# Patient Record
Sex: Male | Born: 1960
Health system: Southern US, Community
[De-identification: ages and names within clinical notes are randomized; demographics above are authoritative.]

## PROBLEM LIST (undated history)

## (undated) DIAGNOSIS — I779 Disorder of arteries and arterioles, unspecified: Secondary | ICD-10-CM

## (undated) DIAGNOSIS — I2510 Atherosclerotic heart disease of native coronary artery without angina pectoris: Secondary | ICD-10-CM

## (undated) DIAGNOSIS — E785 Hyperlipidemia, unspecified: Secondary | ICD-10-CM

## (undated) DIAGNOSIS — I739 Peripheral vascular disease, unspecified: Secondary | ICD-10-CM

## (undated) DIAGNOSIS — I482 Chronic atrial fibrillation, unspecified: Secondary | ICD-10-CM

## (undated) DIAGNOSIS — Z87442 Personal history of urinary calculi: Secondary | ICD-10-CM

## (undated) DIAGNOSIS — Z720 Tobacco use: Secondary | ICD-10-CM

## (undated) DIAGNOSIS — I10 Essential (primary) hypertension: Secondary | ICD-10-CM

## (undated) HISTORY — DX: Peripheral vascular disease, unspecified: I73.9

## (undated) HISTORY — DX: Atherosclerotic heart disease of native coronary artery without angina pectoris: I25.10

## (undated) HISTORY — DX: Personal history of urinary calculi: Z87.442

## (undated) HISTORY — DX: Essential (primary) hypertension: I10

## (undated) HISTORY — DX: Hyperlipidemia, unspecified: E78.5

## (undated) HISTORY — DX: Tobacco use: Z72.0

---

## 1989-02-19 HISTORY — PX: BACK SURGERY: SHX140

## 2007-07-29 ENCOUNTER — Ambulatory Visit: Payer: Self-pay | Admitting: Cardiology

## 2007-07-30 ENCOUNTER — Inpatient Hospital Stay (HOSPITAL_COMMUNITY): Admission: AD | Admit: 2007-07-30 | Discharge: 2007-08-01 | Payer: Self-pay | Admitting: Cardiovascular Disease

## 2007-07-30 ENCOUNTER — Ambulatory Visit: Payer: Self-pay | Admitting: Cardiology

## 2007-07-31 HISTORY — PX: CARDIAC CATHETERIZATION: SHX172

## 2009-02-19 HISTORY — PX: KIDNEY STONE SURGERY: SHX686

## 2010-07-04 NOTE — Discharge Summary (Signed)
NAME:  SHAUGHN, THOMLEY NO.:  [id number]   MEDICAL RECORD NO.:  [id number]          PATIENT TYPE:  INP   LOCATION:  2038                         FACILITY:  [facility]   PHYSICIAN:  Bevelyn Buckles. Bensimhon, MDDATE OF BIRTH:  03/02/1960   DATE OF ADMISSION:  07/30/2007  DATE OF DISCHARGE:  08/01/2007                               DISCHARGE SUMMARY   REFERRING PHYSICIAN:  Dr. Wende Crease and also Dr. Lewayne Bunting.   REASON FOR ADMISSION:  Chest pain.   PROCEDURES PERFORMED:  Cardiac catheterization.   SECONDARY DIAGNOSES:  Diabetes and tobacco use.   HOSPITAL COURSE:  Mr. Wynter is a 50 year old male with a history of  diabetes and tobacco use.  He had no known history of heart disease.  He  was admitted to [facility] with chest pain and a presyncopal episode.  He  ruled out for myocardial infarction with serial cardiac markers.  He  underwent a stress echocardiogram, which showed EF of 55-60%; however,  there were ST depression with exercise and a question of an inferior  wall motion abnormality, so he was referred for cardiac catheterization.   While he was in the hospital, he was kept on nicotine patch and seen by  the diabetes coordinator.   On June 11, he underwent cardiac catheterization by Dr. Tonny Bollman.  This showed nonobstructive coronary artery disease.  He had 30% lesion  in the mid RCA, 30% lesion in the PDA, 45% lesion in the LAD and mid  LAD, and 30% lesion in the diagonal.  The left circumflex was normal.  The LV function was 60%.   His postcatheterization course was uncomplicated.  His groin site looked  good.   DISCHARGE MEDICATIONS:  He will be discharged home on the following  medications:  1. Aspirin 81 mg a day.  2. Simvastatin 40 mg a day.  3. Nicotine patch 21 mg a day.   FOLLOWUP AFTER DISCHARGE:  1. [facility] [place], on August 05, 2007 to address his      diabetes and other risk factors.  2. He will follow up with Dr. Andee Lineman  in the Cardiology Clinic at      [facility] as needed.  He had a thorough groin check prior to      discharge and will not need a specific visits for that unless he      develops problems.   Duration of the discharge encounter was 20 minutes.      Bevelyn Buckles. Bensimhon, MD  Electronically Signed     DRB/MEDQ  D:  08/01/2007  T:  08/02/2007  Job:  [id number]

## 2010-11-16 LAB — BASIC METABOLIC PANEL
BUN: 11
CO2: 24
Calcium: 9.1
Chloride: 104
Creatinine, Ser: 0.79
GFR calc Af Amer: >60
GFR calc non Af Amer: >60
Glucose, Bld: 201 — ABNORMAL HIGH
Potassium: 3.8
Sodium: 135

## 2010-11-16 LAB — COMPREHENSIVE METABOLIC PANEL
ALT: 36
AST: 32
Albumin: 4.1
Alkaline Phosphatase: 79
BUN: 11
CO2: 27
Calcium: 9.8
Chloride: 102
Creatinine, Ser: 0.84
GFR calc Af Amer: >60
GFR calc non Af Amer: >60
Glucose, Bld: 180 — ABNORMAL HIGH
Potassium: 4.2
Sodium: 137
Total Bilirubin: 0.7
Total Protein: 8

## 2010-11-16 LAB — CARDIAC PANEL(CRET KIN+CKTOT+MB+TROPI)
CK, MB: 1.4
CK, MB: 1.6
CK, MB: 1.8
Relative Index: INVALID
Relative Index: INVALID
Relative Index: INVALID
Total CK: 61
Total CK: 63
Total CK: 67
Troponin I: 0.01
Troponin I: 0.03
Troponin I: 0.03

## 2010-11-16 LAB — APTT: aPTT: 29

## 2010-11-16 LAB — CBC
HCT: 45.2
HCT: 47.4
Hemoglobin: 15.6
Hemoglobin: 16.2
MCHC: 34.2
MCHC: 34.5
MCV: 87.6
MCV: 87.8
Platelets: 133 — ABNORMAL LOW
Platelets: 160
RBC: 5.15
RBC: 5.41
RDW: 13.4
RDW: 13.6
WBC: 13.7 — ABNORMAL HIGH
WBC: 15.9 — ABNORMAL HIGH

## 2010-11-16 LAB — DIFFERENTIAL
Basophils Absolute: 0
Basophils Relative: 0
Eosinophils Absolute: 0.4
Eosinophils Relative: 3
Lymphocytes Relative: 33
Lymphs Abs: 4.5 — ABNORMAL HIGH
Monocytes Absolute: 1.2 — ABNORMAL HIGH
Monocytes Relative: 9
Neutro Abs: 7.6
Neutrophils Relative %: 56

## 2010-11-16 LAB — B-NATRIURETIC PEPTIDE (CONVERTED LAB): Pro B Natriuretic peptide (BNP): 55

## 2010-11-16 LAB — PROTIME-INR
INR: 1
Prothrombin Time: 13.1

## 2010-11-16 LAB — TSH: TSH: 3.113

## 2011-01-04 ENCOUNTER — Encounter: Payer: Self-pay | Admitting: *Deleted

## 2011-01-04 DIAGNOSIS — I4891 Unspecified atrial fibrillation: Secondary | ICD-10-CM

## 2011-01-08 ENCOUNTER — Ambulatory Visit (INDEPENDENT_AMBULATORY_CARE_PROVIDER_SITE_OTHER): Payer: Self-pay | Admitting: Cardiovascular Disease

## 2011-01-08 ENCOUNTER — Encounter: Payer: Self-pay | Admitting: Cardiovascular Disease

## 2011-01-08 VITALS — BP 133/77 | HR 60 | Ht 72.0 in | Wt 217.0 lb

## 2011-01-08 DIAGNOSIS — I2510 Atherosclerotic heart disease of native coronary artery without angina pectoris: Secondary | ICD-10-CM

## 2011-01-08 DIAGNOSIS — I4891 Unspecified atrial fibrillation: Secondary | ICD-10-CM

## 2011-01-08 DIAGNOSIS — I480 Paroxysmal atrial fibrillation: Secondary | ICD-10-CM

## 2011-01-08 DIAGNOSIS — I10 Essential (primary) hypertension: Secondary | ICD-10-CM

## 2011-01-08 NOTE — Patient Instructions (Signed)
Your physician wants you to follow-up in: 3 months with Dr. Earnestine Leys. You will receive a reminder letter in the mail one-two months in advance. If you don't receive a letter, please call our office to schedule the follow-up appointment. Your physician recommends that you continue on your current medications as directed. Please refer to the Current Medication list given to you today.

## 2011-01-09 ENCOUNTER — Encounter: Payer: Self-pay | Admitting: Cardiovascular Disease

## 2011-01-09 DIAGNOSIS — I2510 Atherosclerotic heart disease of native coronary artery without angina pectoris: Secondary | ICD-10-CM | POA: Insufficient documentation

## 2011-01-09 NOTE — Progress Notes (Signed)
HPI  This is a 50 year old male who is here today for a followup visit after he was seen recently in the emergency room by Dr. Earnestine Leys. The patient was sent to the emergency room from the health clinic Department. He complained day of episodes of anxiety and palpitations. They did an ECG which showed atrial fibrillation which was new. In the emergency room, his cardiac enzymes were negative. He had a quick bedside echo done which showed normal left ventricular systolic function and left atrial size. He was asked to take aspirin daily and was started on metoprolol 25 mg twice daily with arrangement for an early clinic followup for possible cardioversion. The patient overall feels better. He denies chest pain or dyspnea. He describes these episodes of anxiety which mostly happens in the morning. These has been less since he started taking metoprolol. He denies syncope or presyncope.  Allergies  Allergen Reactions  . Ciprofloxacin Rash     No current outpatient prescriptions on file prior to visit.     Past Medical History  Diagnosis Date  . Diabetes mellitus   . Tobacco abuse   . Dyslipidemia   . Hypertension   . History of kidney stones   . Paroxysmal atrial fibrillation 12/2010  . Coronary artery disease      Past Surgical History  Procedure Date  . Back surgery 1991  . Kidney stone surgery 2011  . Cardiac catheterization 07/31/2007    This showed nonobstructive coronary artery disease   He had 30% lesion in the mid RCA, 30% lesion in the PDA, 45% lesion in the LAD and mid  LAD, and 30% lesion in the diagonal.  The left circumflex was normal.  The LV function was 60%.        Family History  Problem Relation Age of Onset  . Coronary artery disease Father     CABG in early 68's  . Heart attack Father   . Cancer Mother   . Diabetes Maternal Grandmother   . SIDS Sister      History   Social History  . Marital Status: Single    Spouse Name: N/A    Number of Children: N/A    . Years of Education: N/A   Occupational History  . plumber     by trade   Social History Main Topics  . Smoking status: Current Everyday Smoker -- 1.0 packs/day for 35 years    Types: Cigarettes  . Smokeless tobacco: Never Used   Comment: started smoking at age 89  . Alcohol Use: Not on file  . Drug Use: Not on file  . Sexually Active: Not on file   Other Topics Concern  . Not on file   Social History Narrative   Has 2 children [place] with wifeCurrently unemployed     PHYSICAL EXAM   BP 133/77  Pulse 60  Ht 6' (1.829 m)  Wt 217 lb (98.431 kg)  BMI 29.43 kg/m2  Constitutional: He is oriented to person, place, and time. He appears well-developed and well-nourished. No distress.  HENT: No nasal discharge.  Head: Normocephalic and atraumatic.  Eyes: Pupils are equal, round, and reactive to light. Right eye exhibits no discharge. Left eye exhibits no discharge.  Neck: Normal range of motion. Neck supple. No JVD present. No thyromegaly present.  Cardiovascular: Normal rate, regular rhythm, normal heart sounds and intact distal pulses. Exam reveals no gallop and no friction rub.  No murmur heard.  Pulmonary/Chest: Effort normal  and breath sounds normal. No stridor. No respiratory distress. He has no wheezes. He has no rales. He exhibits no tenderness.  Abdominal: Soft. Bowel sounds are normal. He exhibits no distension. There is no tenderness. There is no rebound and no guarding.  Musculoskeletal: Normal range of motion. He exhibits no edema and no tenderness.  Neurological: He is alert and oriented to person, place, and time. Coordination normal.  Skin: Skin is warm and dry. No rash noted. He is not diaphoretic. No erythema. No pallor.  Psychiatric: He has a normal mood and affect. His behavior is normal. Judgment and thought content normal.      EKG: Normal sinus rhythm with no significant ST or T wave changes.   ASSESSMENT AND PLAN

## 2011-01-09 NOTE — Assessment & Plan Note (Signed)
The patient has known history of mild nonobstructive coronary artery disease. He is not having any symptoms of angina at this time. I discussed with him the importance of lifestyle changes especially smoking cessation.

## 2011-01-09 NOTE — Assessment & Plan Note (Signed)
The patient is currently in normal sinus rhythm. He seems to have paroxysmal atrial fibrillation. Symptomatically he seems to be better after taking metoprolol 25 mg twice daily. Had a prolonged discussion with him about the natural history and complications associated with atrial fibrillation. At this time, obviously there is no need for cardioversion given that he converted with metoprolol. This medication will be continued. I also recommend continuing aspirin daily. His chads score is 2 due to history of hypertension and diabetes. Thus, I think he would benefit from long-term anticoagulation. This was discussed with him. Unfortunately, he does not have financial means at this point to followup with regular INR checks. He is also not able to afford the newer medications. He understands the risk. I will have him followup with Dr. Earnestine Leys in 3 months from now.

## 2016-03-05 ENCOUNTER — Encounter (HOSPITAL_COMMUNITY): Payer: Self-pay | Admitting: Emergency Medicine

## 2016-03-05 ENCOUNTER — Emergency Department (HOSPITAL_COMMUNITY): Payer: 59

## 2016-03-05 ENCOUNTER — Emergency Department (HOSPITAL_COMMUNITY)
Admission: EM | Admit: 2016-03-05 | Discharge: 2016-03-05 | Disposition: A | Discharge status: Home / Self Care | Payer: 59 | Attending: Emergency Medicine | Admitting: Emergency Medicine

## 2016-03-05 DIAGNOSIS — Z7984 Long term (current) use of oral hypoglycemic drugs: Secondary | ICD-10-CM | POA: Diagnosis not present

## 2016-03-05 DIAGNOSIS — I2510 Atherosclerotic heart disease of native coronary artery without angina pectoris: Secondary | ICD-10-CM | POA: Insufficient documentation

## 2016-03-05 DIAGNOSIS — J40 Bronchitis, not specified as acute or chronic: Secondary | ICD-10-CM | POA: Diagnosis not present

## 2016-03-05 DIAGNOSIS — E1165 Type 2 diabetes mellitus with hyperglycemia: Secondary | ICD-10-CM | POA: Insufficient documentation

## 2016-03-05 DIAGNOSIS — I480 Paroxysmal atrial fibrillation: Secondary | ICD-10-CM | POA: Insufficient documentation

## 2016-03-05 DIAGNOSIS — F17210 Nicotine dependence, cigarettes, uncomplicated: Secondary | ICD-10-CM | POA: Diagnosis not present

## 2016-03-05 DIAGNOSIS — R739 Hyperglycemia, unspecified: Secondary | ICD-10-CM

## 2016-03-05 DIAGNOSIS — R05 Cough: Secondary | ICD-10-CM | POA: Diagnosis present

## 2016-03-05 LAB — CBC WITH DIFFERENTIAL/PLATELET
Basophils Absolute: 0 10*3/uL (ref 0.0–0.1)
Basophils Relative: 0 %
Eosinophils Absolute: 0.2 10*3/uL (ref 0.0–0.7)
Eosinophils Relative: 1 %
HCT: 48.3 % (ref 39.0–52.0)
Hemoglobin: 17 g/dL (ref 13.0–17.0)
Lymphocytes Relative: 24 %
Lymphs Abs: 3.7 10*3/uL (ref 0.7–4.0)
MCH: 30.7 pg (ref 26.0–34.0)
MCHC: 35.2 g/dL (ref 30.0–36.0)
MCV: 87.3 fL (ref 78.0–100.0)
Monocytes Absolute: 1.2 10*3/uL — ABNORMAL HIGH (ref 0.1–1.0)
Monocytes Relative: 8 %
Neutro Abs: 10.5 10*3/uL — ABNORMAL HIGH (ref 1.7–7.7)
Neutrophils Relative %: 67 %
Platelets: 198 10*3/uL (ref 150–400)
RBC: 5.53 MIL/uL (ref 4.22–5.81)
RDW: 12.6 % (ref 11.5–15.5)
WBC: 15.6 10*3/uL — ABNORMAL HIGH (ref 4.0–10.5)

## 2016-03-05 LAB — URINALYSIS, ROUTINE W REFLEX MICROSCOPIC
Bilirubin Urine: NEGATIVE
Glucose, UA: >=500 mg/dL — AB
Hgb urine dipstick: NEGATIVE
Ketones, ur: NEGATIVE mg/dL
Leukocytes, UA: NEGATIVE
Nitrite: NEGATIVE
Specific Gravity, Urine: 1.015 (ref 1.005–1.030)
pH: 6 (ref 5.0–8.0)

## 2016-03-05 LAB — TSH: TSH: 1.9 u[IU]/mL (ref 0.350–4.500)

## 2016-03-05 LAB — URINALYSIS, MICROSCOPIC (REFLEX)
Bacteria, UA: NONE SEEN
RBC / HPF: NONE SEEN RBC/hpf (ref 0–5)
Squamous Epithelial / LPF: NONE SEEN
WBC, UA: NONE SEEN WBC/hpf (ref 0–5)

## 2016-03-05 LAB — COMPREHENSIVE METABOLIC PANEL
ALT: 18 U/L (ref 17–63)
AST: 17 U/L (ref 15–41)
Albumin: 3.8 g/dL (ref 3.5–5.0)
Alkaline Phosphatase: 64 U/L (ref 38–126)
Anion gap: 10 (ref 5–15)
BUN: 14 mg/dL (ref 6–20)
CO2: 22 mmol/L (ref 22–32)
Calcium: 9.3 mg/dL (ref 8.9–10.3)
Chloride: 100 mmol/L — ABNORMAL LOW (ref 101–111)
Creatinine, Ser: 0.86 mg/dL (ref 0.61–1.24)
GFR calc Af Amer: >60 mL/min (ref >60)
GFR calc non Af Amer: >60 mL/min (ref >60)
Glucose, Bld: 371 mg/dL — ABNORMAL HIGH (ref 65–99)
Potassium: 3.6 mmol/L (ref 3.5–5.1)
Sodium: 132 mmol/L — ABNORMAL LOW (ref 135–145)
Total Bilirubin: 0.5 mg/dL (ref 0.3–1.2)
Total Protein: 7.9 g/dL (ref 6.5–8.1)

## 2016-03-05 LAB — MAGNESIUM: Magnesium: 1.8 mg/dL (ref 1.7–2.4)

## 2016-03-05 LAB — TROPONIN I: Troponin I: 0.02 ng/mL (ref <0.03)

## 2016-03-05 MED ORDER — ALBUTEROL SULFATE HFA 108 (90 BASE) MCG/ACT IN AERS
2.0000 | INHALATION_SPRAY | Freq: Once | RESPIRATORY_TRACT | Status: AC
  Administered 2016-03-05: 2 via RESPIRATORY_TRACT
  Filled 2016-03-05: qty 6.7

## 2016-03-05 MED ORDER — SODIUM CHLORIDE 0.9 % IV BOLUS (SEPSIS)
1000.0000 mL | Freq: Once | INTRAVENOUS | Status: AC
  Administered 2016-03-05: 1000 mL via INTRAVENOUS

## 2016-03-05 MED ORDER — IPRATROPIUM-ALBUTEROL 0.5-2.5 (3) MG/3ML IN SOLN
3.0000 mL | Freq: Once | RESPIRATORY_TRACT | Status: AC
  Administered 2016-03-05: 3 mL via RESPIRATORY_TRACT
  Filled 2016-03-05: qty 3

## 2016-03-05 MED ORDER — METFORMIN HCL 500 MG PO TABS: 1000.0000 mg | ORAL_TABLET | Freq: Three times a day (TID) | ORAL | 0 refills | Status: DC

## 2016-03-05 MED ORDER — AZITHROMYCIN 250 MG PO TABS: ORAL_TABLET | ORAL | 0 refills | Status: DC

## 2016-03-05 MED ORDER — GLIMEPIRIDE 4 MG PO TABS: 4.0000 mg | ORAL_TABLET | Freq: Every day | ORAL | 0 refills | Status: DC

## 2016-03-05 MED ORDER — METOPROLOL TARTRATE 25 MG PO TABS: 25.0000 mg | ORAL_TABLET | Freq: Two times a day (BID) | ORAL | 0 refills | Status: DC

## 2016-03-05 MED ORDER — BENAZEPRIL HCL 10 MG PO TABS: 10.0000 mg | ORAL_TABLET | Freq: Every day | ORAL | 0 refills | Status: DC

## 2016-03-05 MED ORDER — METOPROLOL TARTRATE 25 MG PO TABS
25.0000 mg | ORAL_TABLET | Freq: Once | ORAL | Status: AC
  Administered 2016-03-05: 25 mg via ORAL
  Filled 2016-03-05: qty 1

## 2016-03-05 NOTE — ED Notes (Signed)
During triage, patient states he is not taking any of his medications. States "yea I have high blood pressure, diabetes, and a-fib but I stopped taking my medicine a long time ago cause I don't feel any symptoms."

## 2016-03-05 NOTE — Discharge Instructions (Signed)
Follow-up with one of the clinics listed, to establish primary care.  You have an appt with Dr. Verna CzechBranch's office (cardiology) on Jan 29th at 10 am.  Try to arrive 15 min early to register

## 2016-03-05 NOTE — ED Triage Notes (Signed)
Patient complaining of non-productive cough and nasal congestion x 2 days.

## 2016-03-05 NOTE — ED Provider Notes (Signed)
[facility]EMER[facility]Y DEPT Provider Note   CSN: [id number] Arrival date & time: 03/05/16  0904     History   Chief Complaint Chief Complaint  Patient presents with  . Cough    HPI Willie Morales is a 56 y.o. male.  HPI   Willie Morales is a 56 y.o. male with hx of chronic Atrial fib, DM, and HTN and non-compliance of medication, who presents to the Emergency Department complaining of cough and nasal congestion for 2 days.  States the cough is non-productive and worse at night.  He states that his employer advised him to "get checked out."  He has tried OTC cold and cough medication without relief. He denies chest pain, shortness of breath, fever. He has that he stopped taking his medications greater than 6 months ago.   Past Medical History:  Diagnosis Date  . Coronary artery disease   . Diabetes mellitus   . Dyslipidemia   . History of kidney stones   . Hypertension   . Paroxysmal atrial fibrillation (HCC) 12/2010  . Tobacco abuse     Patient Active Problem List   Diagnosis Date Noted  . Paroxysmal atrial fibrillation (HCC)   . Coronary artery disease     Past Surgical History:  Procedure Laterality Date  . BACK SURGERY  1991  . CARDIAC CATHETERIZATION  07/31/2007   This showed nonobstructive coronary artery disease   He had 30% lesion in the mid RCA, 30% lesion in the PDA, 45% lesion in the LAD and mid  LAD, and 30% lesion in the diagonal.  The left circumflex was normal.  The LV function was 60%.     Marland Kitchen KIDNEY STONE SURGERY  2011       Home Medications    Prior to Admission medications   Medication Sig Start Date End Date Taking? Authorizing Provider  aspirin EC 81 MG tablet Take 81 mg by mouth daily.      Historical Provider, MD  benazepril (LOTENSIN) 10 MG tablet Take 10 mg by mouth daily.      Historical Provider, MD  fish oil-omega-3 fatty acids 1000 MG capsule Take 1 g by mouth 2 (two) times daily.      Historical Provider, MD  glimepiride (AMARYL) 4 MG tablet  Take 4 mg by mouth 2 (two) times daily.      Historical Provider, MD  metFORMIN (GLUCOPHAGE) 1000 MG tablet Take 1,500 mg by mouth daily with breakfast. And 1000mg  at evening     Historical Provider, MD  metoprolol tartrate (LOPRESSOR) 25 MG tablet Take 25 mg by mouth 2 (two) times daily.      Historical Provider, MD  pravastatin (PRAVACHOL) 40 MG tablet Take 40 mg by mouth daily.      Historical Provider, MD    Family History Family History  Problem Relation Age of Onset  . Coronary artery disease Father     CABG in early 12's  . Heart attack Father   . Cancer Mother   . Diabetes Maternal Grandmother   . SIDS Sister     Social History Social History  Substance Use Topics  . Smoking status: Current Every Day Smoker    Packs/day: 1.00    Years: 35.00    Types: Cigarettes  . Smokeless tobacco: Never Used     Comment: started smoking at age 13  . Alcohol use Not on file     Allergies   Ciprofloxacin   Review of Systems Review of Systems  Constitutional: Negative for  appetite change, chills and fever.  HENT: Positive for congestion. Negative for sore throat and trouble swallowing.   Respiratory: Positive for cough and chest tightness. Negative for shortness of breath and wheezing.   Cardiovascular: Negative for chest pain.  Gastrointestinal: Negative for abdominal pain, nausea and vomiting.  Genitourinary: Negative for dysuria.  Musculoskeletal: Negative for arthralgias.  Skin: Negative for rash.  Neurological: Negative for dizziness, weakness and numbness.  Hematological: Negative for adenopathy.  All other systems reviewed and are negative.    Physical Exam Updated Vital Signs BP 161/98 (BP Location: Left Arm)   Pulse 69   Temp 97.9 F (36.6 C) (Oral)   Resp 22   Ht 6\' 1"  (1.854 m)   Wt 77.1 kg   SpO2 100%   BMI 22.43 kg/m   Physical Exam  Constitutional: He is oriented to person, place, and time. He appears well-developed and well-nourished. No distress.   HENT:  Head: Normocephalic and atraumatic.  Right Ear: Tympanic membrane and ear canal normal.  Left Ear: Tympanic membrane and ear canal normal.  Mouth/Throat: Uvula is midline, oropharynx is clear and moist and mucous membranes are normal. No oropharyngeal exudate.  Eyes: EOM are normal. Pupils are equal, round, and reactive to light.  Neck: Normal range of motion, full passive range of motion without pain and phonation normal. Neck supple.  Cardiovascular: Intact distal pulses.  An irregularly irregular rhythm present. Tachycardia present.   No murmur heard. Pulmonary/Chest: Effort normal. No stridor. No respiratory distress. He has no rales. He exhibits no tenderness.  Coarse lungs sounds bilaterally.    Musculoskeletal: He exhibits no edema.  Lymphadenopathy:    He has no cervical adenopathy.  Neurological: He is alert and oriented to person, place, and time. No sensory deficit. He exhibits normal muscle tone. Coordination and gait normal. GCS eye subscore is 4. GCS verbal subscore is 5. GCS motor subscore is 6.  Skin: Skin is warm and dry.  Psychiatric: He has a normal mood and affect.  Nursing note and vitals reviewed.    ED Treatments / Results  Labs (all labs ordered are listed, but only abnormal results are displayed) Labs Reviewed  CBC WITH DIFFERENTIAL/PLATELET - Abnormal; Notable for the following:       Result Value   WBC 15.6 (*)    Neutro Abs 10.5 (*)    Monocytes Absolute 1.2 (*)    All other components within normal limits  COMPREHENSIVE METABOLIC PANEL - Abnormal; Notable for the following:    Sodium 132 (*)    Chloride 100 (*)    Glucose, Bld 371 (*)    All other components within normal limits  URINALYSIS, ROUTINE W REFLEX MICROSCOPIC - Abnormal; Notable for the following:    Glucose, UA >=500 (*)    Protein, ur TRACE (*)    All other components within normal limits  TROPONIN I  URINALYSIS, MICROSCOPIC (REFLEX)  MAGNESIUM  TSH    EKG  EKG  Interpretation  Date/Time:  Monday March 05 2016 10:09:02 EST Ventricular Rate:  127 PR Interval:    QRS Duration: 92 QT Interval:  295 QTC Calculation: 429 R Axis:   83 Text Interpretation:  Atrial fibrillation Ventricular premature complex Anteroseptal infarct, old Borderline repolarization abnormality history of atrial fibrillation, no prior EKG  Confirmed by LIU MD, DANA [phone]([id number]) on 03/05/2016 10:27:43 AM       Radiology Dg Chest 2 View  Result Date: 03/05/2016 CLINICAL DATA:  Nonproductive cough and nasal congestion 2 days.  EXAM: CHEST  2 VIEW COMPARISON:  01/04/2011 and 07/30/2007 FINDINGS: Lungs are adequately inflated without focal airspace consolidation or effusion. There is subtle interstitial prominence over the lung bases. Cardiomediastinal silhouette is within normal. There is minimal degenerative change of the spine. IMPRESSION: No acute airspace consolidation or effusion. Subtle interstitial prominence over the lung bases which may be chronic although could be seen with an atypical infectious or inflammatory process. Electronically Signed   By: Elberta Fortis M.D.   On: 03/05/2016 10:32     Procedures Procedures (including critical care time)  Medications Ordered in ED Medications  ipratropium-albuterol (DUONEB) 0.5-2.5 (3) MG/3ML nebulizer solution 3 mL (3 mLs Nebulization Given 03/05/16 1026)  sodium chloride 0.9 % bolus 1,000 mL (0 mLs Intravenous Stopped 03/05/16 1220)  metoprolol tartrate (LOPRESSOR) tablet 25 mg (25 mg Oral Given 03/05/16 1335)  albuterol (PROVENTIL HFA;VENTOLIN HFA) 108 (90 Base) MCG/ACT inhaler 2 puff (2 puffs Inhalation Given 03/05/16 1301)     Initial Impression / Assessment and Plan / ED Course  I have reviewed the triage vital signs and the nursing notes.  Pertinent labs & imaging results that were available during my care of the patient were reviewed by me and considered in my medical decision making (see chart for details).  Clinical  Course     Pt is well appearing.  Vitals stable.  Likely viral URI.  CXR neg for PNA. Lung sounds improved after albuterol neb  HR irregular, rate improved after IVF's.  1240  Consulted Dr. Wyline Mood, discussed findings, recommended re start pt's Lopressor 25 mg BID and office will call back with appt. Time  Will refill other medications, give referral info to primary care.  Pt agrees to f/u plan and cards f/u appt.  Strict return precautions also given.    The patient appears reasonably screened and/or stabilized for discharge and I doubt any other medical condition or other [facility] requiring further screening, evaluation, or treatment in the ED at this time prior to discharge.   Final Clinical Impressions(s) / ED Diagnoses   Final diagnoses:  Bronchitis  Hyperglycemia  Paroxysmal atrial fibrillation [facility])    New Prescriptions Discharge Medication List as of 03/05/2016  1:26 PM    START taking these medications   Details  azithromycin (ZITHROMAX) 250 MG tablet Take first 2 tablets together, then 1 every day until finished., Print    benazepril (LOTENSIN) 10 MG tablet Take 1 tablet (10 mg total) by mouth daily., Starting Mon 03/05/2016, Print    glimepiride (AMARYL) 4 MG tablet Take 1 tablet (4 mg total) by mouth daily with breakfast., Starting Mon 03/05/2016, Print    metFORMIN (GLUCOPHAGE) 500 MG tablet Take 2 tablets (1,000 mg total) by mouth 3 (three) times daily., Starting Mon 03/05/2016, Print    metoprolol (LOPRESSOR) 25 MG tablet Take 1 tablet (25 mg total) by mouth 2 (two) times daily., Starting Mon 03/05/2016, Print         Netanel Yannuzzi Herminie, PA-C 03/07/16 6962    Lavera Guise, MD 03/08/16 1245

## 2016-03-19 ENCOUNTER — Ambulatory Visit (INDEPENDENT_AMBULATORY_CARE_PROVIDER_SITE_OTHER): Payer: 59 | Admitting: Adult Health

## 2016-03-19 ENCOUNTER — Telehealth: Payer: Self-pay | Admitting: Adult Health

## 2016-03-19 ENCOUNTER — Encounter: Payer: Self-pay | Admitting: *Deleted

## 2016-03-19 ENCOUNTER — Encounter: Payer: Self-pay | Admitting: Adult Health

## 2016-03-19 VITALS — BP 114/64 | HR 87 | Ht 72.0 in | Wt 168.0 lb

## 2016-03-19 DIAGNOSIS — E784 Other hyperlipidemia: Secondary | ICD-10-CM

## 2016-03-19 DIAGNOSIS — I10 Essential (primary) hypertension: Secondary | ICD-10-CM

## 2016-03-19 DIAGNOSIS — R739 Hyperglycemia, unspecified: Secondary | ICD-10-CM

## 2016-03-19 DIAGNOSIS — E7849 Other hyperlipidemia: Secondary | ICD-10-CM

## 2016-03-19 DIAGNOSIS — I480 Paroxysmal atrial fibrillation: Secondary | ICD-10-CM

## 2016-03-19 DIAGNOSIS — E119 Type 2 diabetes mellitus without complications: Secondary | ICD-10-CM

## 2016-03-19 MED ORDER — GLIMEPIRIDE 4 MG PO TABS: 4.0000 mg | ORAL_TABLET | Freq: Every day | ORAL | 6 refills | Status: AC

## 2016-03-19 MED ORDER — METOPROLOL TARTRATE 25 MG PO TABS: 25.0000 mg | ORAL_TABLET | Freq: Two times a day (BID) | ORAL | 6 refills | Status: DC

## 2016-03-19 MED ORDER — BENAZEPRIL HCL 10 MG PO TABS: 10.0000 mg | ORAL_TABLET | Freq: Every day | ORAL | 6 refills | Status: DC

## 2016-03-19 MED ORDER — METFORMIN HCL 500 MG PO TABS: 1000.0000 mg | ORAL_TABLET | Freq: Three times a day (TID) | ORAL | 6 refills | Status: DC

## 2016-03-19 NOTE — Patient Instructions (Signed)
Your physician recommends that you schedule a follow-up appointment in: 1 Week  Your physician recommends that you continue on your current medications as directed. Please refer to the Current Medication list given to you today.  Your physician has requested that you have an echocardiogram. Echocardiography is a painless test that uses sound waves to create images of your heart. It provides your doctor with information about the size and shape of your heart and how well your heart's chambers and valves are working. This procedure takes approximately one hour. There are no restrictions for this procedure.  Your physician recommends that you return for lab work in: Today  You have been given a letter for work.   You have been given sample of Eliquis 5 mg  If you need a refill on your cardiac medications before your next appointment, please call your pharmacy.  Thank you for choosing [facility]!

## 2016-03-19 NOTE — Progress Notes (Signed)
Name: Willie Morales    DOB: 26-Nov-1960  Age: 56 y.o.  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 Palpitations    VS Vitals:   03/19/16 0958  BP: 114/64  Pulse: 87  SpO2: 93%  Weight: 168 lb (76.2 kg)  Height: 6' (1.829 m)    Weights Current Weight  03/19/16 168 lb (76.2 kg)  03/05/16 170 lb (77.1 kg)  01/08/11 217 lb (98.4 kg)    Blood Pressure  BP Readings from Last 3 Encounters:  03/19/16 114/64  03/05/16 160/86  01/08/11 133/77     Admit date:  (Not on file) Last encounter with RMR:  Visit date not found   Allergy Ciprofloxacin  Current Outpatient Prescriptions  Medication Sig Dispense Refill  . aspirin 81 MG chewable tablet Chew 81 mg by mouth daily.    . benazepril (LOTENSIN) 10 MG tablet Take 1 tablet (10 mg total) by mouth daily. 30 tablet 0  . glimepiride (AMARYL) 4 MG tablet Take 1 tablet (4 mg total) by mouth daily with breakfast. 15 tablet 0  . metFORMIN (GLUCOPHAGE) 500 MG tablet Take 2 tablets (1,000 mg total) by mouth 3 (three) times daily. 60 tablet 0  . metoprolol (LOPRESSOR) 25 MG tablet Take 1 tablet (25 mg total) by mouth 2 (two) times daily. 30 tablet 0   No current facility-administered medications for this visit.     Discontinued Meds:    Medications Discontinued During This Encounter  Medication Reason  . azithromycin (ZITHROMAX) 250 MG tablet Error    Patient Active Problem List   Diagnosis Date Noted  . Paroxysmal atrial fibrillation (HCC)   . Coronary artery disease     LABS    Component Value Date/Time   NA 132 (L) 03/05/2016 1039   NA 135 07/31/2007 0115   NA 137 07/30/2007 1820   K 3.6 03/05/2016 1039   K 3.8 07/31/2007 0115   K 4.2 07/30/2007 1820   CL 100 (L) 03/05/2016 1039   CL 104 07/31/2007 0115   CL 102 07/30/2007 1820   CO2 22 03/05/2016 1039   CO2  24 07/31/2007 0115   CO2 27 07/30/2007 1820   GLUCOSE 371 (H) 03/05/2016 1039   GLUCOSE 201 (H) 07/31/2007 0115   GLUCOSE 180 (H) 07/30/2007 1820   BUN 14 03/05/2016 1039   BUN 11 07/31/2007 0115   BUN 11 07/30/2007 1820   CREATININE 0.86 03/05/2016 1039   CREATININE 0.79 07/31/2007 0115   CREATININE 0.84 07/30/2007 1820   CALCIUM 9.3 03/05/2016 1039   CALCIUM 9.1 07/31/2007 0115   CALCIUM 9.8 07/30/2007 1820   GFRNONAA >60 03/05/2016 1039   GFRNONAA >60 07/31/2007 0115   GFRNONAA >60 07/30/2007 1820   GFRAA >60 03/05/2016 1039   GFRAA  07/31/2007 0115    >60        The eGFR has been calculated using the MDRD equation. This calculation has not been validated in all clinical   GFRAA  07/30/2007 1820    >60        The eGFR has been calculated using the MDRD equation. This calculation has not been validated in all clinical   CMP     Component Value Date/Time   NA 132 (L) 03/05/2016 1039  K 3.6 03/05/2016 1039   CL 100 (L) 03/05/2016 1039   CO2 22 03/05/2016 1039   GLUCOSE 371 (H) 03/05/2016 1039   BUN 14 03/05/2016 1039   CREATININE 0.86 03/05/2016 1039   CALCIUM 9.3 03/05/2016 1039   PROT 7.9 03/05/2016 1039   ALBUMIN 3.8 03/05/2016 1039   AST 17 03/05/2016 1039   ALT 18 03/05/2016 1039   ALKPHOS 64 03/05/2016 1039   BILITOT 0.5 03/05/2016 1039   GFRNONAA >60 03/05/2016 1039   GFRAA >60 03/05/2016 1039       Component Value Date/Time   WBC 15.6 (H) 03/05/2016 1039   WBC 13.7 (H) 07/31/2007 0115   WBC 15.9 (H) 07/30/2007 1820   HGB 17.0 03/05/2016 1039   HGB 15.6 07/31/2007 0115   HGB 16.2 07/30/2007 1820   HCT 48.3 03/05/2016 1039   HCT 45.2 07/31/2007 0115   HCT 47.4 07/30/2007 1820   MCV 87.3 03/05/2016 1039   MCV 87.8 07/31/2007 0115   MCV 87.6 07/30/2007 1820    Lipid Panel  No results found for: CHOL, TRIG, HDL, CHOLHDL, VLDL, LDLCALC, LDLDIRECT  ABG No results found for: PHART, PCO2ART, PO2ART, HCO3, TCO2, ACIDBASEDEF, O2SAT   Lab Results   Component Value Date   TSH 1.900 03/05/2016   BNP (last 3 results) No results for input(s): BNP in the last 8760 hours.  ProBNP (last 3 results) No results for input(s): PROBNP in the last 8760 hours.  Cardiac Panel (last 3 results) No results for input(s): CKTOTAL, CKMB, TROPONINI, RELINDX in the last 72 hours.  Iron/TIBC/Ferritin/ %Sat No results found for: IRON, TIBC, FERRITIN, IRONPCTSAT   EKG Orders placed or performed in visit on 03/19/16  . EKG 12-Lead     Prior Assessment and Plan Problem List as of 03/19/2016 Reviewed: 01/09/2011  1:02 PM by Kathlyn Sacramento, MD     Cardiovascular and Mediastinum   Paroxysmal atrial fibrillation [facility])   Last Assessment & Plan 01/08/2011 Office Visit Written 01/09/2011  1:04 PM by Wellington Hampshire, MD     The patient is currently in normal sinus rhythm. He seems to have paroxysmal atrial fibrillation. Symptomatically he seems to be better after taking metoprolol 25 mg twice daily. Had a prolonged discussion with him about the natural history and complications associated with atrial fibrillation. At this time, obviously there is no need for cardioversion given that he converted with metoprolol. This medication will be continued. I also recommend continuing aspirin daily. His chads score is 2 due to history of hypertension and diabetes. Thus, I think he would benefit from long-term anticoagulation. This was discussed with him. Unfortunately, he does not have financial means at this point to followup with regular INR checks. He is also not able to afford the newer medications. He understands the risk. I will have him followup with Dr. Lutricia Feil in 3 months from now.      Coronary artery disease   Last Assessment & Plan 01/08/2011 Office Visit Written 01/09/2011  1:05 PM by Wellington Hampshire, MD    The patient has known history of mild nonobstructive coronary artery disease. He is not having any symptoms of angina at this time. I discussed with him the  importance of lifestyle changes especially smoking cessation.          Imaging: Dg Chest 2 View  Result Date: 03/05/2016 CLINICAL DATA:  Nonproductive cough and nasal congestion 2 days. EXAM: CHEST  2 VIEW COMPARISON:  01/04/2011 and 07/30/2007 FINDINGS: Lungs are adequately  inflated without focal airspace consolidation or effusion. There is subtle interstitial prominence over the lung bases. Cardiomediastinal silhouette is within normal. There is minimal degenerative change of the spine. IMPRESSION: No acute airspace consolidation or effusion. Subtle interstitial prominence over the lung bases which may be chronic although could be seen with an atypical infectious or inflammatory process. Electronically Signed   By: Marin Olp M.D.   On: 03/05/2016 10:32     Name: Willie Morales    DOB: 12-29-1960  Age: 56 y.o.  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 Palpitations    VS Vitals:   03/19/16 0958  BP: 114/64  Pulse: 87  SpO2: 93%  Weight: 168 lb (76.2 kg)  Height: 6' (1.829 m)    Weights Current Weight  03/19/16 168 lb (76.2 kg)  03/05/16 170 lb (77.1 kg)  01/08/11 217 lb (98.4 kg)    Blood Pressure  BP Readings from Last 3 Encounters:  03/19/16 114/64  03/05/16 160/86  01/08/11 133/77     Admit date:  (Not on file) Last encounter with RMR:  Visit date not found   Allergy Ciprofloxacin  Current Outpatient Prescriptions  Medication Sig Dispense Refill  . aspirin 81 MG chewable tablet Chew 81 mg by mouth daily.    . benazepril (LOTENSIN) 10 MG tablet Take 1 tablet (10 mg total) by mouth daily. 30 tablet 0  . glimepiride (AMARYL) 4 MG tablet Take 1 tablet (4 mg total) by mouth daily with breakfast. 15 tablet 0  . metFORMIN (GLUCOPHAGE) 500 MG tablet Take 2 tablets (1,000 mg total) by mouth 3 (three)  times daily. 60 tablet 0  . metoprolol (LOPRESSOR) 25 MG tablet Take 1 tablet (25 mg total) by mouth 2 (two) times daily. 30 tablet 0   No current facility-administered medications for this visit.     Discontinued Meds:    Medications Discontinued During This Encounter  Medication Reason  . azithromycin (ZITHROMAX) 250 MG tablet Error    Patient Active Problem List   Diagnosis Date Noted  . Paroxysmal atrial fibrillation (HCC)   . Coronary artery disease     LABS    Component Value Date/Time   NA 132 (L) 03/05/2016 1039   NA 135 07/31/2007 0115   NA 137 07/30/2007 1820   K 3.6 03/05/2016 1039   K 3.8 07/31/2007 0115   K 4.2 07/30/2007 1820   CL 100 (L) 03/05/2016 1039   CL 104 07/31/2007 0115   CL 102 07/30/2007 1820   CO2 22 03/05/2016 1039   CO2 24 07/31/2007 0115   CO2 27 07/30/2007 1820   GLUCOSE 371 (H) 03/05/2016 1039   GLUCOSE 201 (H) 07/31/2007 0115   GLUCOSE 180 (H) 07/30/2007 1820   BUN 14 03/05/2016 1039   BUN 11 07/31/2007 0115   BUN 11 07/30/2007 1820   CREATININE 0.86 03/05/2016 1039   CREATININE 0.79 07/31/2007 0115   CREATININE 0.84 07/30/2007 1820   CALCIUM 9.3 03/05/2016 1039   CALCIUM 9.1 07/31/2007 0115   CALCIUM 9.8 07/30/2007 1820   GFRNONAA >60 03/05/2016 1039   GFRNONAA >60 07/31/2007 0115   GFRNONAA >60 07/30/2007 1820   GFRAA >60 03/05/2016 1039   GFRAA  07/31/2007 0115    >60  The eGFR has been calculated using the MDRD equation. This calculation has not been validated in all clinical   GFRAA  07/30/2007 1820    >60        The eGFR has been calculated using the MDRD equation. This calculation has not been validated in all clinical   CMP     Component Value Date/Time   NA 132 (L) 03/05/2016 1039   K 3.6 03/05/2016 1039   CL 100 (L) 03/05/2016 1039   CO2 22 03/05/2016 1039   GLUCOSE 371 (H) 03/05/2016 1039   BUN 14 03/05/2016 1039   CREATININE 0.86 03/05/2016 1039   CALCIUM 9.3 03/05/2016 1039   PROT 7.9  03/05/2016 1039   ALBUMIN 3.8 03/05/2016 1039   AST 17 03/05/2016 1039   ALT 18 03/05/2016 1039   ALKPHOS 64 03/05/2016 1039   BILITOT 0.5 03/05/2016 1039   GFRNONAA >60 03/05/2016 1039   GFRAA >60 03/05/2016 1039       Component Value Date/Time   WBC 15.6 (H) 03/05/2016 1039   WBC 13.7 (H) 07/31/2007 0115   WBC 15.9 (H) 07/30/2007 1820   HGB 17.0 03/05/2016 1039   HGB 15.6 07/31/2007 0115   HGB 16.2 07/30/2007 1820   HCT 48.3 03/05/2016 1039   HCT 45.2 07/31/2007 0115   HCT 47.4 07/30/2007 1820   MCV 87.3 03/05/2016 1039   MCV 87.8 07/31/2007 0115   MCV 87.6 07/30/2007 1820    Lipid Panel  No results found for: CHOL, TRIG, HDL, CHOLHDL, VLDL, LDLCALC, LDLDIRECT  ABG No results found for: PHART, PCO2ART, PO2ART, HCO3, TCO2, ACIDBASEDEF, O2SAT   Lab Results  Component Value Date   TSH 1.900 03/05/2016   BNP (last 3 results) No results for input(s): BNP in the last 8760 hours.  ProBNP (last 3 results) No results for input(s): PROBNP in the last 8760 hours.  Cardiac Panel (last 3 results) No results for input(s): CKTOTAL, CKMB, TROPONINI, RELINDX in the last 72 hours.  Iron/TIBC/Ferritin/ %Sat No results found for: IRON, TIBC, FERRITIN, IRONPCTSAT   EKG Orders placed or performed in visit on 03/19/16  . EKG 12-Lead     Prior Assessment and Plan Problem List as of 03/19/2016 Reviewed: 01/09/2011  1:02 PM by Kathlyn Sacramento, MD     Cardiovascular and Mediastinum   Paroxysmal atrial fibrillation [facility])   Last Assessment & Plan 01/08/2011 Office Visit Written 01/09/2011  1:04 PM by Wellington Hampshire, MD     The patient is currently in normal sinus rhythm. He seems to have paroxysmal atrial fibrillation. Symptomatically he seems to be better after taking metoprolol 25 mg twice daily. Had a prolonged discussion with him about the natural history and complications associated with atrial fibrillation. At this time, obviously there is no need for cardioversion given that he  converted with metoprolol. This medication will be continued. I also recommend continuing aspirin daily. His chads score is 2 due to history of hypertension and diabetes. Thus, I think he would benefit from long-term anticoagulation. This was discussed with him. Unfortunately, he does not have financial means at this point to followup with regular INR checks. He is also not able to afford the newer medications. He understands the risk. I will have him followup with Dr. Lutricia Feil in 3 months from now.      Coronary artery disease   Last Assessment & Plan 01/08/2011 Office Visit Written 01/09/2011  1:05 PM by Wellington Hampshire, MD    The patient has known  history of mild nonobstructive coronary artery disease. He is not having any symptoms of angina at this time. I discussed with him the importance of lifestyle changes especially smoking cessation.          Imaging: Dg Chest 2 View  Result Date: 03/05/2016 CLINICAL DATA:  Nonproductive cough and nasal congestion 2 days. EXAM: CHEST  2 VIEW COMPARISON:  01/04/2011 and 07/30/2007 FINDINGS: Lungs are adequately inflated without focal airspace consolidation or effusion. There is subtle interstitial prominence over the lung bases. Cardiomediastinal silhouette is within normal. There is minimal degenerative change of the spine. IMPRESSION: No acute airspace consolidation or effusion. Subtle interstitial prominence over the lung bases which may be chronic although could be seen with an atypical infectious or inflammatory process. Electronically Signed   By: Marin Olp M.D.   On: 03/05/2016 10:32

## 2016-03-19 NOTE — Progress Notes (Signed)
Cardiology Office Note   Date:  03/19/2016   ID:  Willie Morales, DOB Aug 07, 1960, MRN [id number]  PCP:  No PCP Per Patient  Cardiologist: Arida/  Joni Reining, NP   Chief Complaint  Patient presents with  . Palpitations      History of Present Illness: Willie Morales is a 56 y.o. male who presents for ongoing assessment and management of chest discomfort with history of anxiety and palpitations. The patient has been seen by Dr. Kirke Corin on 01/09/2011 in the setting of atrial fibrillation which was new onset. His rate is controlled with metoprolol. He did convert to normal sinus rhythm, he continues on aspirin daily. CHADS VASC Score of 2. Other history includes hypertension, nonobstructive coronary artery disease. He has not been seen since 2012.  He was in the emergency room on 03/05/2016, at which time he is complaining of cough and congestion. The patient was found to have irregular heart rhythm, and telephone consultation was had by ER physician and Dr. Wyline Mood. He was recommended to increase metoprolol to 25 mg twice a day. He is here for post ER follow-up to be reestablished.  He is a little anxious today and does not understand why he has irregular heart rate or what is involved in this diagenesis. 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 He has periods of confusion associated with his diabetes. He is not checking his blood glucose at home.  He denies chest pain, dyspnea, or fatigue. He is talking his medications as directed but needs refills on all of them until being established with PCP.   Past Medical History:  Diagnosis Date  . Coronary artery disease   . Diabetes mellitus   . Dyslipidemia   . History of kidney stones   . Hypertension   . Paroxysmal atrial fibrillation (HCC) 12/2010  . Tobacco abuse     Past Surgical History:  Procedure Laterality Date  . BACK SURGERY  1991  . CARDIAC CATHETERIZATION  07/31/2007   This showed nonobstructive coronary artery disease   He  had 30% lesion in the mid RCA, 30% lesion in the PDA, 45% lesion in the LAD and mid  LAD, and 30% lesion in the diagonal.  The left circumflex was normal.  The LV function was 60%.     Marland Kitchen KIDNEY STONE SURGERY  2011     Current Outpatient Prescriptions  Medication Sig Dispense Refill  . aspirin 81 MG chewable tablet Chew 81 mg by mouth daily.    . benazepril (LOTENSIN) 10 MG tablet Take 1 tablet (10 mg total) by mouth daily. 30 tablet 6  . glimepiride (AMARYL) 4 MG tablet Take 1 tablet (4 mg total) by mouth daily with breakfast. 30 tablet 6  . metFORMIN (GLUCOPHAGE) 500 MG tablet Take 2 tablets (1,000 mg total) by mouth 3 (three) times daily. 90 tablet 6  . metoprolol tartrate (LOPRESSOR) 25 MG tablet Take 1 tablet (25 mg total) by mouth 2 (two) times daily. 60 tablet 6   No current facility-administered medications for this visit.     Allergies:   Ciprofloxacin    Social History:  The patient  reports that he quit smoking about 3 months ago. His smoking use included Cigarettes. He has a 35.00 pack-year smoking history. He has never used smokeless tobacco. He reports that he does not drink alcohol.   Family History:  The patient's family history includes Cancer in his mother; Coronary artery disease in his father; Diabetes in his maternal grandmother;  Heart attack in his father; SIDS in his sister.    ROS: All other systems are reviewed and negative. Unless otherwise mentioned in H&P    PHYSICAL EXAM: VS:  BP 114/64   Pulse 87   Ht 6' (1.829 m)   Wt 168 lb (76.2 kg)   SpO2 93%   BMI 22.78 kg/m  , BMI Body mass index is 22.78 kg/m. GEN: Well nourished, well developed, in no acute distress  HEENT: normal  Neck: no JVD, carotid bruits, or masses Cardiac: IRRR; no murmurs, rubs, or gallops,no edema  Respiratory:  clear to auscultation bilaterally, normal work of breathing GI: soft, nontender, nondistended, + BS MS: no deformity or atrophy  Skin: warm and dry, no rash Neuro:   Strength and sensation are intact Psych: euthymic mood, full affect   EKG: The ekg ordered today demonstrates Atrial fib rate of 73 bpm. LVH pattern noted.    Recent Labs: 03/05/2016: ALT 18; BUN 14; Creatinine, Ser 0.86; Hemoglobin 17.0; Magnesium 1.8; Platelets 198; Potassium 3.6; Sodium 132; TSH 1.900    Lipid Panel No results found for: CHOL, TRIG, HDL, CHOLHDL, VLDL, LDLCALC, LDLDIRECT    Wt Readings from Last 3 Encounters:  03/19/16 168 lb (76.2 kg)  03/05/16 170 lb (77.1 kg)  01/08/11 217 lb (98.4 kg)      ASSESSMENT AND PLAN:  1. Atrial fib: Had a history of paroxysmal atrial fibrillation, but currently in atrial fibrillation per EKG in the office today. I discussed at length with him about stroke prevention, and heart rate control in atrial fibrillation. He is not currently on anticoagulation therapy. He denies any bleeding issues, recent surgeries, or ulcers.  I will continue him on Lopressor 25 mg twice a day. He will be given samples of ELIQUIS 5 mg twice a day, review of labs from ER on 03/05/2016 reveals a creatinine of 0.86. Hemoglobin of 17.0 hematocrit of 48.3. He will be given paperwork to fill out to assist in financial burden of DOAC. Echocardiogram will be completed for LV function. He is requesting a week off from work as he is very anxious about [facility] system, and to give him time to have echocardiogram. I provided this for him.  2. History of nonobstructive CAD. He is currently asymptomatic. I will not plan any ischemic testing at this time unless he has substantially reduced ejection fraction. He will continue on metoprolol and benazepril. He will need to have fasting lipids and LFTs for cholesterol burden.   3. Hypertension: Currently well-controlled. No changes in regimen at this time.  4. Diabetes: Patient does not check fingersticks at home. Most recent glucose was elevated in the emergency room at 371. He is currently on metformin  and glimepiride. I have given him refills on all of his medications and a list of primary care physicians accepting patients. We will not manage his diabetes. I will go ahead and order a hemoglobin A1c as long as her getting lipids. This will help PCP to adjust medications if necessary.   Current medicines are reviewed at length with the patient today.    Labs/ tests ordered today include:   Orders Placed This Encounter  Procedures  . EKG 12-Lead     Disposition:   FU with One week for discussion of test results.  Signed, Joni ReiningKathryn Carol Loftin, NP  03/19/2016 10:23 AM    [facility] [address], [place], [place] [address] Phone: [phone]; Fax: [phone]  [phone] 

## 2016-03-19 NOTE — Telephone Encounter (Signed)
I will forward to Harriet PhoK lawrence NP

## 2016-03-19 NOTE — Telephone Encounter (Signed)
Called to back to inform of what type of glucometer he has.  States that he has 3. Freestyle Lite, OneTouch Ultra and an Accucheck. / tg

## 2016-03-19 NOTE — Telephone Encounter (Signed)
Please order on bottle of test strips for this machine. No refills as he will need to see PCP for management.

## 2016-03-20 ENCOUNTER — Other Ambulatory Visit (HOSPITAL_COMMUNITY)
Admission: RE | Admit: 2016-03-20 | Discharge: 2016-03-20 | Disposition: A | Discharge status: Home / Self Care | Payer: 59 | Source: Ambulatory Visit | Attending: Adult Health | Admitting: Adult Health

## 2016-03-20 ENCOUNTER — Ambulatory Visit (HOSPITAL_COMMUNITY)
Admission: RE | Admit: 2016-03-20 | Discharge: 2016-03-20 | Disposition: A | Discharge status: Home / Self Care | Payer: 59 | Source: Ambulatory Visit | Attending: Adult Health | Admitting: Adult Health

## 2016-03-20 DIAGNOSIS — I2510 Atherosclerotic heart disease of native coronary artery without angina pectoris: Secondary | ICD-10-CM | POA: Diagnosis not present

## 2016-03-20 DIAGNOSIS — R739 Hyperglycemia, unspecified: Secondary | ICD-10-CM | POA: Diagnosis not present

## 2016-03-20 DIAGNOSIS — I340 Nonrheumatic mitral (valve) insufficiency: Secondary | ICD-10-CM | POA: Insufficient documentation

## 2016-03-20 DIAGNOSIS — I071 Rheumatic tricuspid insufficiency: Secondary | ICD-10-CM | POA: Diagnosis not present

## 2016-03-20 DIAGNOSIS — I480 Paroxysmal atrial fibrillation: Secondary | ICD-10-CM | POA: Diagnosis not present

## 2016-03-20 LAB — ECHOCARDIOGRAM COMPLETE
E decel time: 254 msec
FS: 36 % (ref 28–44)
IVS/LV PW RATIO, ED: 0.96
LA ID, A-P, ES: 44 mm
LA diam end sys: 44 mm
LA diam index: 2.24 cm/m2
LA vol A4C: 52.9 ml
LA vol index: 37.7 mL/m2
LA vol: 74.2 mL
LV PW d: 11.4 mm — AB (ref 0.6–1.1)
LV dias vol index: 48 mL/m2
LV dias vol: 94 mL (ref 62–150)
LV sys vol index: 18 mL/m2
LV sys vol: 35 mL (ref 21–61)
LVOT SV: 65 mL
LVOT VTI: 22.9 cm
LVOT area: 2.84 cm2
LVOT diameter: 19 mm
LVOT peak grad rest: 8 mmHg
LVOT peak vel: 140 cm/s
Lateral S' vel: 9.36 cm/s
MV Dec: 254
MV Peak grad: 4 mmHg
MV pk E vel: 100 m/s
Simpson's disk: 62
Stroke v: 58 ml
TAPSE: 14.2 mm

## 2016-03-20 LAB — LIPID PANEL
Cholesterol: 189 mg/dL (ref 0–200)
HDL: 50 mg/dL (ref >40)
LDL Cholesterol: 102 mg/dL — ABNORMAL HIGH (ref 0–99)
Total CHOL/HDL Ratio: 3.8 RATIO
Triglycerides: 185 mg/dL — ABNORMAL HIGH (ref <150)
VLDL: 37 mg/dL (ref 0–40)

## 2016-03-20 NOTE — Telephone Encounter (Signed)
Patient will call insurance company to see what machine they prefer.

## 2016-03-20 NOTE — Progress Notes (Signed)
*  PRELIMINARY RESULTS* Echocardiogram 2D Echocardiogram has been performed.  Stacey DrainWhite, Dodie Parisi J 03/20/2016, 10:27 AM

## 2016-03-21 ENCOUNTER — Encounter: Payer: Self-pay | Admitting: Adult Health

## 2016-03-22 ENCOUNTER — Telehealth: Payer: Self-pay | Admitting: Adult Health

## 2016-03-22 LAB — HEMOGLOBIN A1C
Hgb A1c MFr Bld: 11.2 % — ABNORMAL HIGH (ref 4.8–5.6)
Mean Plasma Glucose: 275 mg/dL

## 2016-03-22 NOTE — Telephone Encounter (Signed)
I called [employer] and spoke to OJ the call Ref # is [id number]. OJ states that the pt's test supply are covered at 100% after he has meet his deductible. [facility] [place]

## 2016-03-22 NOTE — Telephone Encounter (Signed)
Patient notified

## 2016-03-22 NOTE — Telephone Encounter (Signed)
See PCP for this, or go to ER. BP should be controlled. Make sure it is. Will not order testing for this at this time from cardiology perspective as this is a non-specific complaint. If this persists, or worsens, go to ER.

## 2016-03-22 NOTE — Telephone Encounter (Signed)
Will forward to Harriet PhoK Lawrence NP for advice, was told by family nurse friend to have MRI

## 2016-03-22 NOTE — Telephone Encounter (Signed)
Patient's wife stating patient is having pressure in his head. Requesting that patient have MRI. / tg

## 2016-03-26 ENCOUNTER — Ambulatory Visit (INDEPENDENT_AMBULATORY_CARE_PROVIDER_SITE_OTHER): Payer: 59 | Admitting: Adult Health

## 2016-03-26 ENCOUNTER — Encounter: Payer: Self-pay | Admitting: Adult Health

## 2016-03-26 VITALS — BP 122/86 | HR 82 | Ht 73.0 in | Wt 173.0 lb

## 2016-03-26 DIAGNOSIS — R7309 Other abnormal glucose: Secondary | ICD-10-CM | POA: Diagnosis not present

## 2016-03-26 DIAGNOSIS — I480 Paroxysmal atrial fibrillation: Secondary | ICD-10-CM | POA: Diagnosis not present

## 2016-03-26 DIAGNOSIS — Z79899 Other long term (current) drug therapy: Secondary | ICD-10-CM

## 2016-03-26 DIAGNOSIS — E119 Type 2 diabetes mellitus without complications: Secondary | ICD-10-CM | POA: Diagnosis not present

## 2016-03-26 MED ORDER — APIXABAN 5 MG PO TABS: 5.0000 mg | ORAL_TABLET | Freq: Two times a day (BID) | ORAL | 6 refills | Status: DC

## 2016-03-26 NOTE — Progress Notes (Signed)
Name: Willie Morales    DOB: 15-Aug-1960  Age: 56 y.o.  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   VS Vitals:   03/26/16 1410  BP: 122/86  Pulse: 82  SpO2: 99%  Weight: 173 lb (78.5 kg)  Height: '6\' 1"'  (1.854 m)    Weights Current Weight  03/26/16 173 lb (78.5 kg)  03/19/16 168 lb (76.2 kg)  03/05/16 170 lb (77.1 kg)    Blood Pressure  BP Readings from Last 3 Encounters:  03/26/16 122/86  03/19/16 114/64  03/05/16 160/86     Admit date:  (Not on file) Last encounter with RMR:  03/22/2016   Allergy Ciprofloxacin  Current Outpatient Prescriptions  Medication Sig Dispense Refill  . aspirin 81 MG chewable tablet Chew 81 mg by mouth daily.    . benazepril (LOTENSIN) 10 MG tablet Take 1 tablet (10 mg total) by mouth daily. 30 tablet 6  . glimepiride (AMARYL) 4 MG tablet Take 1 tablet (4 mg total) by mouth daily with breakfast. 30 tablet 6  . metFORMIN (GLUCOPHAGE) 500 MG tablet Take 2 tablets (1,000 mg total) by mouth 3 (three) times daily. 90 tablet 6  . metoprolol tartrate (LOPRESSOR) 25 MG tablet Take 1 tablet (25 mg total) by mouth 2 (two) times daily. 60 tablet 6   No current facility-administered medications for this visit.     Discontinued Meds:   There are no discontinued medications.  Patient Active Problem List   Diagnosis Date Noted  . Paroxysmal atrial fibrillation (HCC)   . Coronary artery disease     LABS    Component Value Date/Time   NA 132 (L) 03/05/2016 1039   NA 135 07/31/2007 0115   NA 137 07/30/2007 1820   K 3.6 03/05/2016 1039   K 3.8 07/31/2007 0115   K 4.2 07/30/2007 1820   CL 100 (L) 03/05/2016 1039   CL 104 07/31/2007 0115   CL 102 07/30/2007 1820   CO2 22 03/05/2016 1039   CO2 24 07/31/2007 0115   CO2 27 07/30/2007 1820   GLUCOSE 371 (H) 03/05/2016 1039   GLUCOSE 201 (H) 07/31/2007 0115    GLUCOSE 180 (H) 07/30/2007 1820   BUN 14 03/05/2016 1039   BUN 11 07/31/2007 0115   BUN 11 07/30/2007 1820   CREATININE 0.86 03/05/2016 1039   CREATININE 0.79 07/31/2007 0115   CREATININE 0.84 07/30/2007 1820   CALCIUM 9.3 03/05/2016 1039   CALCIUM 9.1 07/31/2007 0115   CALCIUM 9.8 07/30/2007 1820   GFRNONAA >60 03/05/2016 1039   GFRNONAA >60 07/31/2007 0115   GFRNONAA >60 07/30/2007 1820   GFRAA >60 03/05/2016 1039   GFRAA  07/31/2007 0115    >60        The eGFR has been calculated using the MDRD equation. This calculation has not been validated in all clinical   GFRAA  07/30/2007 1820    >60        The eGFR has been calculated using the MDRD equation. This calculation has not been validated in all clinical   CMP     Component Value Date/Time   NA 132 (L) 03/05/2016 1039   K 3.6 03/05/2016 1039   CL 100 (L) 03/05/2016 1039   CO2 22 03/05/2016 1039   GLUCOSE 371 (  H) 03/05/2016 1039   BUN 14 03/05/2016 1039   CREATININE 0.86 03/05/2016 1039   CALCIUM 9.3 03/05/2016 1039   PROT 7.9 03/05/2016 1039   ALBUMIN 3.8 03/05/2016 1039   AST 17 03/05/2016 1039   ALT 18 03/05/2016 1039   ALKPHOS 64 03/05/2016 1039   BILITOT 0.5 03/05/2016 1039   GFRNONAA >60 03/05/2016 1039   GFRAA >60 03/05/2016 1039       Component Value Date/Time   WBC 15.6 (H) 03/05/2016 1039   WBC 13.7 (H) 07/31/2007 0115   WBC 15.9 (H) 07/30/2007 1820   HGB 17.0 03/05/2016 1039   HGB 15.6 07/31/2007 0115   HGB 16.2 07/30/2007 1820   HCT 48.3 03/05/2016 1039   HCT 45.2 07/31/2007 0115   HCT 47.4 07/30/2007 1820   MCV 87.3 03/05/2016 1039   MCV 87.8 07/31/2007 0115   MCV 87.6 07/30/2007 1820    Lipid Panel     Component Value Date/Time   CHOL 189 03/20/2016 0755   TRIG 185 (H) 03/20/2016 0755   HDL 50 03/20/2016 0755   CHOLHDL 3.8 03/20/2016 0755   VLDL 37 03/20/2016 0755   LDLCALC 102 (H) 03/20/2016 0755    ABG No results found for: PHART, PCO2ART, PO2ART, HCO3, TCO2,  ACIDBASEDEF, O2SAT   Lab Results  Component Value Date   TSH 1.900 03/05/2016   BNP (last 3 results) No results for input(s): BNP in the last 8760 hours.  ProBNP (last 3 results) No results for input(s): PROBNP in the last 8760 hours.  Cardiac Panel (last 3 results) No results for input(s): CKTOTAL, CKMB, TROPONINI, RELINDX in the last 72 hours.  Iron/TIBC/Ferritin/ %Sat No results found for: IRON, TIBC, FERRITIN, IRONPCTSAT   EKG Orders placed or performed in visit on 03/19/16  . EKG 12-Lead     Prior Assessment and Plan Problem List as of 03/26/2016 Reviewed: 03/19/2016 10:31 AM by Jory Sims, NP     Cardiovascular and Mediastinum   Paroxysmal atrial fibrillation [facility])   Last Assessment & Plan 01/08/2011 Office Visit Written 01/09/2011  1:04 PM by Wellington Hampshire, MD     The patient is currently in normal sinus rhythm. He seems to have paroxysmal atrial fibrillation. Symptomatically he seems to be better after taking metoprolol 25 mg twice daily. Had a prolonged discussion with him about the natural history and complications associated with atrial fibrillation. At this time, obviously there is no need for cardioversion given that he converted with metoprolol. This medication will be continued. I also recommend continuing aspirin daily. His chads score is 2 due to history of hypertension and diabetes. Thus, I think he would benefit from long-term anticoagulation. This was discussed with him. Unfortunately, he does not have financial means at this point to followup with regular INR checks. He is also not able to afford the newer medications. He understands the risk. I will have him followup with Dr. Lutricia Feil in 3 months from now.      Coronary artery disease   Last Assessment & Plan 01/08/2011 Office Visit Written 01/09/2011  1:05 PM by Wellington Hampshire, MD    The patient has known history of mild nonobstructive coronary artery disease. He is not having any symptoms of angina at  this time. I discussed with him the importance of lifestyle changes especially smoking cessation.          Imaging: Dg Chest 2 View  Result Date: 03/05/2016 CLINICAL DATA:  Nonproductive cough and nasal congestion 2 days. EXAM:  CHEST  2 VIEW COMPARISON:  01/04/2011 and 07/30/2007 FINDINGS: Lungs are adequately inflated without focal airspace consolidation or effusion. There is subtle interstitial prominence over the lung bases. Cardiomediastinal silhouette is within normal. There is minimal degenerative change of the spine. IMPRESSION: No acute airspace consolidation or effusion. Subtle interstitial prominence over the lung bases which may be chronic although could be seen with an atypical infectious or inflammatory process. Electronically Signed   By: Marin Olp M.D.   On: 03/05/2016 10:32

## 2016-03-26 NOTE — Progress Notes (Signed)
Cardiology Office Note   Date:  03/26/2016   ID:  Willie AweGeorge Sheeley, DOB 10/30/1960, MRN [id number]  PCP:  No PCP Per Patient  Cardiologist:  Arida/ Joni ReiningKathryn Avy Barlett, NP   No chief complaint on file.     History of Present Illness: Willie Morales is a 56 y.o. male who presents for ongoing assessment and management of chest discomfort, with history of anxiety and palpitations. He was last seen in the office on 03/19/2016, he also has a history of paroxysmal atrial fibrillation, and was in atrial fibrillation on last office visit on 03/19/2016, he was not on any anticoagulation, as he was anxious about taking it. I did express my concerns about stroke risk and provided him samples. Echocardiogram was ordered. He was continued on all other medications. He was referred to  primary care physician for management of diabetes.  Echocardiogram 03/20/2016 Left ventricle: The cavity size was normal. Wall thickness was   increased in a pattern of mild LVH. Systolic function was normal.   The estimated ejection fraction was in the range of 60% to 65%.   Wall motion was normal; there were no regional wall motion   abnormalities. The study was not technically sufficient to allow   evaluation of LV diastolic dysfunction due to atrial   fibrillation. - Aortic valve: Mildly calcified annulus. Trileaflet; mildly   thickened leaflets. - Mitral valve: Mildly thickened leaflets . There was mild   regurgitation. - Left atrium: The atrium was moderately dilated. - Right ventricle: Systolic function was mildly reduced. - Tricuspid valve: There was mild regurgitation.  He is feeling very well, is medically compliant, is tolerating ELIQUIS samples, and denies any bleeding issues or diarrhea which she experienced with a different anticoagulation medication. He has not yet been established with primary care.  Past Medical History:  Diagnosis Date  . Coronary artery disease   . Diabetes mellitus   . Dyslipidemia   .  History of kidney stones   . Hypertension   . Paroxysmal atrial fibrillation (HCC) 12/2010  . Tobacco abuse     Past Surgical History:  Procedure Laterality Date  . BACK SURGERY  1991  . CARDIAC CATHETERIZATION  07/31/2007   This showed nonobstructive coronary artery disease   He had 30% lesion in the mid RCA, 30% lesion in the PDA, 45% lesion in the LAD and mid  LAD, and 30% lesion in the diagonal.  The left circumflex was normal.  The LV function was 60%.     Marland Kitchen. KIDNEY STONE SURGERY  2011     Current Outpatient Prescriptions  Medication Sig Dispense Refill  . aspirin 81 MG chewable tablet Chew 81 mg by mouth daily.    . benazepril (LOTENSIN) 10 MG tablet Take 1 tablet (10 mg total) by mouth daily. 30 tablet 6  . glimepiride (AMARYL) 4 MG tablet Take 1 tablet (4 mg total) by mouth daily with breakfast. 30 tablet 6  . metFORMIN (GLUCOPHAGE) 500 MG tablet Take 2 tablets (1,000 mg total) by mouth 3 (three) times daily. 90 tablet 6  . metoprolol tartrate (LOPRESSOR) 25 MG tablet Take 1 tablet (25 mg total) by mouth 2 (two) times daily. 60 tablet 6  . apixaban (ELIQUIS) 5 MG TABS tablet Take 1 tablet (5 mg total) by mouth 2 (two) times daily. 60 tablet 6   No current facility-administered medications for this visit.     Allergies:   Ciprofloxacin    Social History:  The patient  reports that he quit  smoking about 3 months ago. His smoking use included Cigarettes. He has a 35.00 pack-year smoking history. He has never used smokeless tobacco. He reports that he does not drink alcohol.   Family History:  The patient's family history includes Cancer in his mother; Coronary artery disease in his father; Diabetes in his maternal grandmother; Heart attack in his father; SIDS in his sister.    ROS: All other systems are reviewed and negative. Unless otherwise mentioned in H&P    PHYSICAL EXAM: VS:  BP 122/86   Pulse 82   Ht 6\' 1"  (1.854 m)   Wt 173 lb (78.5 kg)   SpO2 99%   BMI 22.82  kg/m  , BMI Body mass index is 22.82 kg/m. GEN: Well nourished, well developed, in no acute distress  HEENT: normal  Neck: no JVD, carotid bruits, or masses Cardiac: RRR; no murmurs, rubs, or gallops,no edema  Respiratory:  clear to auscultation bilaterally, normal work of breathing GI: soft, nontender, nondistended, + BS MS: no deformity or atrophy  Skin: warm and dry, no rash Neuro:  Strength and sensation are intact Psych: euthymic mood, full affect  Recent Labs: 03/05/2016: ALT 18; BUN 14; Creatinine, Ser 0.86; Hemoglobin 17.0; Magnesium 1.8; Platelets 198; Potassium 3.6; Sodium 132; TSH 1.900    Lipid Panel    Component Value Date/Time   CHOL 189 03/20/2016 0755   TRIG 185 (H) 03/20/2016 0755   HDL 50 03/20/2016 0755   CHOLHDL 3.8 03/20/2016 0755   VLDL 37 03/20/2016 0755   LDLCALC 102 (H) 03/20/2016 0755      Wt Readings from Last 3 Encounters:  03/26/16 173 lb (78.5 kg)  03/19/16 168 lb (76.2 kg)  03/05/16 170 lb (77.1 kg)    ASSESSMENT AND PLAN:  1.  Paroxysmal atrial fibrillation: Heart rate is well controlled on metoprolol 25 mg twice a day. He has tolerated ELIQUIS with the samples provided. I will give him a new prescription for 30 days free, more samples are provided. We'll see him again in 3 months for close follow-up. He is in need of BMET at that time.  2. Diabetes: He is even the phone number for [facility]. [phone]. We will not be managing his diabetes. He will need to get his test strips and follow his blood glucose at home. He is encouraged to contact PCP.  Current medicines are reviewed at length with the patient today.    Labs/ tests ordered today include:   Orders Placed This Encounter  Procedures  . HgB A1c  . Basic Metabolic Panel (BMET)     Disposition:   FU with 3 months.  Signed, Joni Reining, NP  03/26/2016 2:30 PM    [facility] [address] [place], [place] [address] Phone: [phone]; Fax: [phone]

## 2016-03-26 NOTE — Patient Instructions (Addendum)
Your physician recommends that you schedule a follow-up appointment in: 3 Months  Your physician recommends that you continue on your current medications as directed. Please refer to the Current Medication list given to you today.  Your physician recommends that you return for lab work just before your next visit.   Call [phone] for PCP   If you need a refill on your cardiac medications before your next appointment, please call your pharmacy.  Thank you for choosing [facility]!

## 2016-04-03 ENCOUNTER — Telehealth: Payer: Self-pay | Admitting: Adult Health

## 2016-04-03 MED ORDER — BENAZEPRIL HCL 10 MG PO TABS: 10.0000 mg | ORAL_TABLET | Freq: Every day | ORAL | 6 refills | Status: DC

## 2016-04-03 NOTE — Telephone Encounter (Signed)
°*  STAT* If patient is at the pharmacy, call can be transferred to refill team.   1. Which medications need to be refilled? (please list name of each medication and dose if known)  benazepril (LOTENSIN) 10 MG tablet [[id number]][[id number]]   2. Which pharmacy/location (including street and city if local pharmacy) is medication to be sent to?  [facility]   3. Do they need a 30 day or 90 day supply?  30 day

## 2016-04-04 ENCOUNTER — Telehealth: Payer: Self-pay | Admitting: Adult Health

## 2016-04-04 NOTE — Telephone Encounter (Signed)
Has questions about RX's that were sent to pharmacy. / tg

## 2016-04-04 NOTE — Telephone Encounter (Signed)
Called pt, spoke to wife- she stated she picked up his prescriptions yesterday and all was fine.

## 2016-04-05 ENCOUNTER — Encounter (HOSPITAL_COMMUNITY): Payer: Self-pay | Admitting: Emergency Medicine

## 2016-04-05 ENCOUNTER — Emergency Department (HOSPITAL_COMMUNITY): Payer: 59

## 2016-04-05 ENCOUNTER — Emergency Department (HOSPITAL_COMMUNITY)
Admission: EM | Admit: 2016-04-05 | Discharge: 2016-04-05 | Disposition: A | Discharge status: Home / Self Care | Payer: 59 | Attending: Emergency Medicine | Admitting: Emergency Medicine

## 2016-04-05 DIAGNOSIS — M79605 Pain in left leg: Secondary | ICD-10-CM | POA: Insufficient documentation

## 2016-04-05 DIAGNOSIS — E119 Type 2 diabetes mellitus without complications: Secondary | ICD-10-CM | POA: Diagnosis not present

## 2016-04-05 DIAGNOSIS — Z87891 Personal history of nicotine dependence: Secondary | ICD-10-CM | POA: Insufficient documentation

## 2016-04-05 DIAGNOSIS — Z79899 Other long term (current) drug therapy: Secondary | ICD-10-CM | POA: Diagnosis not present

## 2016-04-05 DIAGNOSIS — Z7982 Long term (current) use of aspirin: Secondary | ICD-10-CM | POA: Insufficient documentation

## 2016-04-05 DIAGNOSIS — Z7984 Long term (current) use of oral hypoglycemic drugs: Secondary | ICD-10-CM | POA: Diagnosis not present

## 2016-04-05 DIAGNOSIS — I2510 Atherosclerotic heart disease of native coronary artery without angina pectoris: Secondary | ICD-10-CM | POA: Diagnosis not present

## 2016-04-05 DIAGNOSIS — I10 Essential (primary) hypertension: Secondary | ICD-10-CM | POA: Diagnosis not present

## 2016-04-05 MED ORDER — HYDROCODONE-ACETAMINOPHEN 5-325 MG PO TABS: ORAL_TABLET | ORAL | 0 refills | Status: DC

## 2016-04-05 MED ORDER — GABAPENTIN 300 MG PO CAPS: 300.0000 mg | ORAL_CAPSULE | Freq: Three times a day (TID) | ORAL | 0 refills | Status: DC

## 2016-04-05 MED ORDER — HYDROCODONE-ACETAMINOPHEN 5-325 MG PO TABS
1.0000 | ORAL_TABLET | Freq: Once | ORAL | Status: AC
  Administered 2016-04-05: 1 via ORAL
  Filled 2016-04-05: qty 1

## 2016-04-05 NOTE — Discharge Instructions (Signed)
Call one of the providers listed to establish primary care.  You can also contact the [facility] at [phone]

## 2016-04-05 NOTE — ED Triage Notes (Signed)
Chronic pain to left lower leg, rates pain 10/10.  Denies any injury.  Denies any other pain at this time.

## 2016-04-08 NOTE — ED Provider Notes (Signed)
[facility]-EMER[facility]Y DEPT Provider Note   CSN: [id number] Arrival date & time: 04/05/16  [id number]     History   Chief Complaint Chief Complaint  Patient presents with  . Leg Pain    left    HPI Willie Morales is a 56 y.o. male.  HPI  Willie Morales is a 56 y.o. male with hx of paroxsymal atrial fibrillation anticoagulated with Eliquis, who presents to the Emergency Department complaining of persistent lower left leg pain for several weeks.  He describes a burning, dull pain from just below the knee to the ankle.  Pain worse at night and weight bearing.  He denies known injury, swelling, discoloration or numbness.  No hx of DVT's or PE's. Also denies shortness of breath.   Past Medical History:  Diagnosis Date  . Coronary artery disease   . Diabetes mellitus   . Dyslipidemia   . History of kidney stones   . Hypertension   . Paroxysmal atrial fibrillation (HCC) 12/2010  . Tobacco abuse     Patient Active Problem List   Diagnosis Date Noted  . Paroxysmal atrial fibrillation (HCC)   . Coronary artery disease     Past Surgical History:  Procedure Laterality Date  . BACK SURGERY  1991  . CARDIAC CATHETERIZATION  07/31/2007   This showed nonobstructive coronary artery disease   He had 30% lesion in the mid RCA, 30% lesion in the PDA, 45% lesion in the LAD and mid  LAD, and 30% lesion in the diagonal.  The left circumflex was normal.  The LV function was 60%.     Marland Kitchen. KIDNEY STONE SURGERY  2011       Home Medications    Prior to Admission medications   Medication Sig Start Date End Date Taking? Authorizing Provider  apixaban (ELIQUIS) 5 MG TABS tablet Take 1 tablet (5 mg total) by mouth 2 (two) times daily. 03/26/16  Yes Jodelle GrossKathryn M Lawrence, NP  aspirin 81 MG chewable tablet Chew 81 mg by mouth daily.   Yes Historical Provider, MD  benazepril (LOTENSIN) 10 MG tablet Take 1 tablet (10 mg total) by mouth daily. 04/03/16  Yes Jodelle GrossKathryn M Lawrence, NP  glimepiride (AMARYL) 4 MG tablet Take 1  tablet (4 mg total) by mouth daily with breakfast. 03/19/16  Yes Jodelle GrossKathryn M Lawrence, NP  metFORMIN (GLUCOPHAGE) 500 MG tablet Take 2 tablets (1,000 mg total) by mouth 3 (three) times daily. 03/19/16  Yes Jodelle GrossKathryn M Lawrence, NP  metoprolol tartrate (LOPRESSOR) 25 MG tablet Take 1 tablet (25 mg total) by mouth 2 (two) times daily. 03/19/16  Yes Jodelle GrossKathryn M Lawrence, NP  gabapentin (NEURONTIN) 300 MG capsule Take 1 capsule (300 mg total) by mouth 3 (three) times daily. 04/05/16   Addelyn Alleman, PA-C  HYDROcodone-acetaminophen (NORCO/VICODIN) 5-325 MG tablet Take one-two tabs po q 4-6 hrs prn pain 04/05/16   Pauline Ausammy Kerensa Nicklas, PA-C    Family History Family History  Problem Relation Age of Onset  . Coronary artery disease Father     CABG in early 5850's  . Heart attack Father   . Cancer Mother   . Diabetes Maternal Grandmother   . SIDS Sister     Social History Social History  Substance Use Topics  . Smoking status: Former Smoker    Packs/day: 1.00    Years: 35.00    Types: Cigarettes    Quit date: 12/18/2015  . Smokeless tobacco: Never Used     Comment: quit "3 months ago"  . Alcohol use  No     Allergies   Ciprofloxacin   Review of Systems Review of Systems  Constitutional: Negative for chills and fever.  Respiratory: Negative for chest tightness and shortness of breath.   Cardiovascular: Negative for chest pain, palpitations and leg swelling.  Genitourinary: Negative for difficulty urinating and dysuria.  Musculoskeletal: Positive for arthralgias and myalgias (left calf pain). Negative for back pain and joint swelling.  Skin: Negative for color change and wound.  Neurological: Negative for weakness and numbness.  All other systems reviewed and are negative.    Physical Exam Updated Vital Signs BP 121/77   Pulse 73   Temp 97.6 F (36.4 C) (Oral)   Resp 18   Ht 6\' 1"  (1.854 m)   Wt 81.6 kg   SpO2 99%   BMI 23.75 kg/m   Physical Exam  Constitutional: He is oriented to  person, place, and time. He appears well-developed and well-nourished. No distress.  HENT:  Head: Normocephalic.  Neck: Normal range of motion. No JVD present.  Cardiovascular: Normal rate, regular rhythm and intact distal pulses.   No murmur heard. Pulmonary/Chest: Effort normal and breath sounds normal. No respiratory distress.  Abdominal: Soft. There is no tenderness.  Musculoskeletal: He exhibits tenderness.  Tenderness of the posterior left calf.  No erythema, excessive warmth or edema.  DP pulse brisk.  Distal sensation intact  Neurological: He is alert and oriented to person, place, and time.  Skin: Skin is warm. No rash noted. No erythema.  Nursing note and vitals reviewed.    ED Treatments / Results  Labs (all labs ordered are listed, but only abnormal results are displayed) Labs Reviewed - No data to display  EKG  EKG Interpretation None       Radiology US Venous Img Lower Unilateral Left  Result Date: 04/05/2016 CLINICAL DATA:  Chronic left lower extremity pain for 6 months EXAM: LEFT LOWER EXTREMITY VENOUS DUPLEX ULTRASOUND TECHNIQUE: Doppler venous assessment of the left lower extremity deep venous system was performed, including characterization of spectral flow, compressibility, and phasicity. COMPARISON:  None. FINDINGS: There is complete compressibility of the left common femoral, femoral, and popliteal veins. Doppler analysis demonstrates respiratory phasicity and augmentation of flow with calf compression. No obvious superficial vein or calf vein thrombosis. IMPRESSION: No evidence of left lower extremity DVT. Electronically Signed   By: Jolaine Click M.D.   On: 04/05/2016 10:09     Procedures Procedures (including critical care time)  Medications Ordered in ED Medications  HYDROcodone-acetaminophen (NORCO/VICODIN) 5-325 MG per tablet 1 tablet (1 tablet Oral Given 04/05/16 0957)     Initial Impression / Assessment and Plan / ED Course  I have reviewed the  triage vital signs and the nursing notes.  Pertinent labs & imaging results that were available during my care of the patient were reviewed by me and considered in my medical decision making (see chart for details).     Pt with chronic left calf pain w/o [place] evidence of DVT.  NV intact.  Pain described as dull and burning, felt to be related to neuropathy secondary to his DM.  Will try short course of pain medication and gabapentin.  Pt agrees to establish PCP.  Referral resources given  Final Clinical Impressions(s) / ED Diagnoses   Final diagnoses:  Left leg pain    New Prescriptions Discharge Medication List as of 04/05/2016 11:04 AM    START taking these medications   Details  gabapentin (NEURONTIN) 300 MG capsule Take 1  capsule (300 mg total) by mouth 3 (three) times daily., Starting Thu 04/05/2016, Print    HYDROcodone-acetaminophen (NORCO/VICODIN) 5-325 MG tablet Take one-two tabs po q 4-6 hrs prn pain, Print         Pauline Aus, PA-C 04/08/16 1550    Bethann Berkshire, MD 04/09/16 1540

## 2016-05-09 ENCOUNTER — Encounter: Payer: Self-pay | Admitting: Adult Health

## 2016-05-29 ENCOUNTER — Other Ambulatory Visit (HOSPITAL_COMMUNITY): Payer: Self-pay | Admitting: Internal Medicine

## 2016-05-29 DIAGNOSIS — R0989 Other specified symptoms and signs involving the circulatory and respiratory systems: Secondary | ICD-10-CM

## 2016-05-29 DIAGNOSIS — R600 Localized edema: Secondary | ICD-10-CM

## 2016-05-31 ENCOUNTER — Ambulatory Visit (HOSPITAL_COMMUNITY)
Admission: RE | Admit: 2016-05-31 | Discharge: 2016-05-31 | Disposition: A | Discharge status: Home / Self Care | Payer: BLUE CROSS/BLUE SHIELD | Source: Ambulatory Visit | Attending: Internal Medicine | Admitting: Internal Medicine

## 2016-05-31 DIAGNOSIS — R0989 Other specified symptoms and signs involving the circulatory and respiratory systems: Secondary | ICD-10-CM | POA: Insufficient documentation

## 2016-05-31 DIAGNOSIS — I739 Peripheral vascular disease, unspecified: Secondary | ICD-10-CM | POA: Diagnosis not present

## 2016-05-31 DIAGNOSIS — R600 Localized edema: Secondary | ICD-10-CM | POA: Diagnosis present

## 2016-06-08 ENCOUNTER — Other Ambulatory Visit: Payer: Self-pay

## 2016-06-08 DIAGNOSIS — L97529 Non-pressure chronic ulcer of other part of left foot with unspecified severity: Secondary | ICD-10-CM

## 2016-06-14 ENCOUNTER — Encounter: Payer: Self-pay | Admitting: Vascular Surgery

## 2016-06-27 ENCOUNTER — Ambulatory Visit (HOSPITAL_COMMUNITY)
Admission: RE | Admit: 2016-06-27 | Discharge: 2016-06-27 | Disposition: A | Discharge status: Home / Self Care | Payer: BLUE CROSS/BLUE SHIELD | Source: Ambulatory Visit | Attending: Vascular Surgery | Admitting: Vascular Surgery

## 2016-06-27 ENCOUNTER — Ambulatory Visit (INDEPENDENT_AMBULATORY_CARE_PROVIDER_SITE_OTHER): Payer: BLUE CROSS/BLUE SHIELD | Admitting: Vascular Surgery

## 2016-06-27 ENCOUNTER — Encounter: Payer: Self-pay | Admitting: Vascular Surgery

## 2016-06-27 VITALS — BP 124/84 | HR 82 | Temp 97.4°F | Resp 18 | Ht 72.0 in | Wt 188.0 lb

## 2016-06-27 DIAGNOSIS — I70203 Unspecified atherosclerosis of native arteries of extremities, bilateral legs: Secondary | ICD-10-CM | POA: Insufficient documentation

## 2016-06-27 DIAGNOSIS — I7025 Atherosclerosis of native arteries of other extremities with ulceration: Secondary | ICD-10-CM

## 2016-06-27 DIAGNOSIS — L97529 Non-pressure chronic ulcer of other part of left foot with unspecified severity: Secondary | ICD-10-CM | POA: Diagnosis not present

## 2016-06-27 LAB — VAS US LOWER EXTREMITY ARTERIAL DUPLEX
LEFT PERO DIST SYS: 30 cm/s
Left ant tibial distal sys: 21 cm/s
Left super femoral dist sys PSV: -344 cm/s
Left super femoral mid sys PSV: -103 cm/s
Left super femoral prox sys PSV: 118 cm/s
RIGHT ANT DIST TIBAL SYS PSV: 17 cm/s
RIGHT POST TIB DIST SYS: 20 cm/s
Right peroneal sys PSV: 15 cm/s
Right super femoral dist sys PSV: -482 cm/s
Right super femoral mid sys PSV: -110 cm/s
Right super femoral prox sys PSV: -148 cm/s
left post tibial dist sys: 21 cm/s

## 2016-06-27 NOTE — Progress Notes (Signed)
Referred by:  Benita Stabile, MD [address] [place], [place] [address]  Reason for referral: left foot ulcers   History of Present Illness   Willie Morales is a 56 y.o. (December 23, 1960) male who presents with chief complaint: left foot pain and ulcers.  Onset of pain a few months ago with development of wounds over the last 1-2 months.  Pain is described as sharp, severity 5-10/10, and associated with ambulation and now rest.  Patient has attempted to treat this pain with OTC medications.  The patient has left foot rest pain symptoms also and blisters and wounds on L foot.  Atherosclerotic risk factors include: DM, HLD, HTN and active smoking.  Past Medical History:  Diagnosis Date  . Coronary artery disease   . Diabetes mellitus   . Dyslipidemia   . History of kidney stones   . Hypertension   . Paroxysmal atrial fibrillation (HCC) 12/2010  . Tobacco abuse     Past Surgical History:  Procedure Laterality Date  . BACK SURGERY  1991  . CARDIAC CATHETERIZATION  07/31/2007   This showed nonobstructive coronary artery disease   He had 30% lesion in the mid RCA, 30% lesion in the PDA, 45% lesion in the LAD and mid  LAD, and 30% lesion in the diagonal.  The left circumflex was normal.  The LV function was 60%.     Marland Kitchen KIDNEY STONE SURGERY  2011    Social History   Social History  . Marital status: Married    Spouse name: N/A  . Number of children: N/A  . Years of education: N/A   Occupational History  . plumber     by trade   Social History Main Topics  . Smoking status: Current Every Day Smoker    Packs/day: 1.00    Years: 35.00    Types: Cigarettes    Last attempt to quit: 12/18/2015  . Smokeless tobacco: Never Used     Comment: quit "3 months ago"  . Alcohol use No  . Drug use: No  . Sexual activity: Not on file   Other Topics Concern  . Not on file   Social History Narrative   Has 2 children    Lives in [place] with wife   Currently unemployed    Family  History  Problem Relation Age of Onset  . Coronary artery disease Father     CABG in early 6's  . Heart attack Father   . Cancer Mother   . Diabetes Maternal Grandmother   . SIDS Sister     Current Outpatient Prescriptions  Medication Sig Dispense Refill  . apixaban (ELIQUIS) 5 MG TABS tablet Take 1 tablet (5 mg total) by mouth 2 (two) times daily. 60 tablet 6  . aspirin 81 MG chewable tablet Chew 81 mg by mouth daily.    . benazepril (LOTENSIN) 10 MG tablet Take 1 tablet (10 mg total) by mouth daily. 30 tablet 6  . glimepiride (AMARYL) 4 MG tablet Take 1 tablet (4 mg total) by mouth daily with breakfast. 30 tablet 6  . HYDROcodone-acetaminophen (NORCO/VICODIN) 5-325 MG tablet Take one-two tabs po q 4-6 hrs prn pain 15 tablet 0  . metFORMIN (GLUCOPHAGE) 500 MG tablet Take 2 tablets (1,000 mg total) by mouth 3 (three) times daily. 90 tablet 6  . metoprolol tartrate (LOPRESSOR) 25 MG tablet Take 1 tablet (25 mg total) by mouth 2 (two) times daily. 60 tablet 6  . cephALEXin (KEFLEX) 500  MG capsule   0  . gabapentin (NEURONTIN) 300 MG capsule Take 1 capsule (300 mg total) by mouth 3 (three) times daily. (Patient not taking: Reported on 06/27/2016) 30 capsule 0   No current facility-administered medications for this visit.     Allergies  Allergen Reactions  . Ciprofloxacin Rash     REVIEW OF SYSTEMS:   Cardiac:  positive for: no symptoms, negative for: Chest pain or chest pressure, Shortness of breath upon exertion and Shortness of breath when lying flat,   Vascular:  positive for: pain with ambulation and rest, leg swelling,  negative for: Blood clot in your veins  Pulmonary:  positive for: no symptoms,  negative for: Oxygen at home, Productive cough and Wheezing  Neurologic:  positive for: Sudden weakness in arms or legs, negative for: Sudden numbness in arms or legs, Sudden onset of difficulty speaking or slurred speech, Temporary loss of vision in one eye and Problems  with dizziness  Gastrointestinal:  positive for: no symptoms, negative for: Blood in stool and Vomited blood  Genitourinary:  positive for: no symptoms, negative for: Burning when urinating and Blood in urine  Psychiatric:  positive for: no symptoms,  negative for: Major depression  Hematologic:  positive for: no symptoms,  negative for: negative for: Bleeding problems and Problems with blood clotting too easily  Dermatologic:  positive for: no symptoms, negative for: Rashes or ulcers  Constitutional:  positive for: no symptoms, negative for: Fever or chills  Ear/Nose/Throat:  positive for: no symptoms, negative for: Change in hearing, Nose bleeds and Sore throat  Musculoskeletal:  positive for: no symptoms, negative for: Back pain, Joint pain and Muscle pain   For VQI Use Only   PRE-ADM LIVING: Home  AMB STATUS: Ambulatory  CAD Sx: None  PRIOR CHF: None  STRESS TEST: No   Physical Examination   Vitals:   06/27/16 1150  BP: 124/84  Pulse: 82  Resp: 18  Temp: 97.4 F (36.3 C)  SpO2: 100%  Weight: 188 lb (85.3 kg)  Height: 6' (1.829 m)    Body mass index is 25.5 kg/m.  General Alert, O x 3, WD, NAD  Head Colman/AT,    Ear/Nose/Throat Hearing grossly intact, nares without erythema or drainage, oropharynx without Erythema or Exudate, Mallampati score: 3, Dentition intact  Eyes PERRLA, EOMI,    Neck Supple, mid-line trachea,    Pulmonary Sym exp, good B air movt, CTA B  Cardiac RRR, Nl S1, S2, no Murmurs, No rubs, No S3,S4  Vascular Vessel Right Left  Radial Palpable Palpable  Brachial Palpable Palpable  Carotid Palpable, No Bruit Palpable, No Bruit  Aorta Not palpable N/A  Femoral Palpable Palpable  Popliteal Not palpable Not palpable  PT Not palpable Not palpable  DP Not palpable Not palpable    Gastrointestinal soft, non-distended, non-tender to palpation, No guarding or rebound, no HSM, no masses, no CVAT B, No palpable prominent aortic  pulse,    Musculoskeletal M/S 5/5 throughout, R Extremities without ischemic changes, L foot with scatters small ulcers with some black eschar, healing blister on L 5th MT laterally, No edema present, Varicosities present: B (small), No Lipodermatosclerosis present  Neurologic Cranial nerves 2-12 intact, Pain and light touch intact in extremities, Motor exam as listed above  Psychiatric Judgement intact, Mood & affect appropriate for pt's clinical situation  Dermatologic See M/S exam for extremity exam, No rashes otherwise noted  Lymphatic  Palpable lymph nodes: None    Non-Invasive Vascular Imaging  Outside BLE Physiologic Study (05/31/16)  R ABI: 0.64,   L ABI: 0.61  BLE Arterial duplex (06/27/16)  R:  Triphasic CFA and PFA  Monophasic throughout remaining  Distal SFA stenosis: PSV 482 c/s  L:  Biphasic CFA and PFA  Monophasic throughout remaining  Distal SFA stenosis: PSV 344 c/s  Imaging suggest occlusion of distal SFA revasc   Outside Studies/Documentation   6 pages of outside documents were reviewed including: outside physiologic studies, outside PCP studies.   Medical Decision Making   Katheran AweGeorge Lasser is a 56 y.o. male who presents with: LLE critical limb ischemia with ulcers, RLE moderate PAD, poorly controlled DM, afib   I discussed with the patient the natural history of critical limb ischemia: 25% require amputation in one year, 50% are able to maintain their limbs in one year, and 25-30% die in one year due to comorbidities.  Given the limb threatening status of this patient, I recommend an aggressive work up including proceeding with an: Aortogram, Bilateral runoff and possible Left leg intervention. I discussed with the patient the nature of angiographic procedures, especially the limited patencies of any endovascular intervention. The patient is aware of that the risks of an angiographic procedure include but are not limited to: bleeding, infection, access  site complications, embolization, rupture of treated vessel, dissection, possible need for emergent surgical intervention, and possible need for surgical procedures to treat the patient's pathology. The patient is aware of the risks and agrees to proceed.  The procedure is scheduled for: 17 MAY 18.  I discussed in depth with the patient the nature of atherosclerosis, and emphasized the importance of maximal medical management including strict control of blood pressure, blood glucose, and lipid levels, antiplatelet agents, obtaining regular exercise, and cessation of smoking.  The patient is aware that without maximal medical management the underlying atherosclerotic disease process will progress, limiting the benefit of any interventions. The patient is currently not on statin.  will obtain lipid panel prior to proceeding.  The patient is currently on an anti-platelet: ASA.  Pt is also on Eliquis for his afib.  Thank you for allowing us to participate in this patient's care.   Leonides SakeBrian Chen, MD, FACS [facility] Office: [phone] Pager: [phone]  06/27/2016, 12:10 PM

## 2016-06-28 ENCOUNTER — Other Ambulatory Visit: Payer: Self-pay

## 2016-07-02 ENCOUNTER — Telehealth: Payer: Self-pay

## 2016-07-02 NOTE — Telephone Encounter (Signed)
[facility] re: recommendation by PCP to discuss if pt. Will need Lovenox bridge, while holding Eliquis.  Spoke to [place] ShoresKeisha, nurse with Harriet PhoK. Lawrence, NP.  Advised that the pt. is scheduled for an Abdominal Aortogram on 07/05/16, and was advised to hold his Eliquis starting today.  Also advised Deanna ArtisKeisha that the pt. Stated he did not take any Eliquis last night.  Keisha rec'd verbal order from K. Lawrence NP, to have the pt. take Eliquis today, hold Tuesday 5/15 and Wednesday 5/16, and resume the evening of the Aortogram, 5/17.  Phone call to pt.  Advised of recommendations for Eliquis as noted above.  Pt. verbalized understanding of.

## 2016-07-05 ENCOUNTER — Ambulatory Visit (HOSPITAL_COMMUNITY)
Admission: RE | Admit: 2016-07-05 | Discharge: 2016-07-05 | Disposition: A | Discharge status: Home / Self Care | Payer: BLUE CROSS/BLUE SHIELD | Source: Ambulatory Visit | Attending: Vascular Surgery | Admitting: Vascular Surgery

## 2016-07-05 ENCOUNTER — Encounter (HOSPITAL_COMMUNITY)
Admission: RE | Disposition: A | Discharge status: Home / Self Care | Payer: Self-pay | Source: Ambulatory Visit | Attending: Vascular Surgery

## 2016-07-05 DIAGNOSIS — I480 Paroxysmal atrial fibrillation: Secondary | ICD-10-CM | POA: Insufficient documentation

## 2016-07-05 DIAGNOSIS — E785 Hyperlipidemia, unspecified: Secondary | ICD-10-CM | POA: Insufficient documentation

## 2016-07-05 DIAGNOSIS — Z7982 Long term (current) use of aspirin: Secondary | ICD-10-CM | POA: Insufficient documentation

## 2016-07-05 DIAGNOSIS — Z8249 Family history of ischemic heart disease and other diseases of the circulatory system: Secondary | ICD-10-CM | POA: Diagnosis not present

## 2016-07-05 DIAGNOSIS — I2510 Atherosclerotic heart disease of native coronary artery without angina pectoris: Secondary | ICD-10-CM | POA: Diagnosis not present

## 2016-07-05 DIAGNOSIS — L97529 Non-pressure chronic ulcer of other part of left foot with unspecified severity: Secondary | ICD-10-CM | POA: Diagnosis not present

## 2016-07-05 DIAGNOSIS — Z7901 Long term (current) use of anticoagulants: Secondary | ICD-10-CM | POA: Insufficient documentation

## 2016-07-05 DIAGNOSIS — E1165 Type 2 diabetes mellitus with hyperglycemia: Secondary | ICD-10-CM | POA: Insufficient documentation

## 2016-07-05 DIAGNOSIS — Z7984 Long term (current) use of oral hypoglycemic drugs: Secondary | ICD-10-CM | POA: Diagnosis not present

## 2016-07-05 DIAGNOSIS — I10 Essential (primary) hypertension: Secondary | ICD-10-CM | POA: Insufficient documentation

## 2016-07-05 DIAGNOSIS — E1151 Type 2 diabetes mellitus with diabetic peripheral angiopathy without gangrene: Secondary | ICD-10-CM | POA: Insufficient documentation

## 2016-07-05 DIAGNOSIS — F17210 Nicotine dependence, cigarettes, uncomplicated: Secondary | ICD-10-CM | POA: Diagnosis not present

## 2016-07-05 DIAGNOSIS — I70201 Unspecified atherosclerosis of native arteries of extremities, right leg: Secondary | ICD-10-CM | POA: Diagnosis not present

## 2016-07-05 DIAGNOSIS — I70245 Atherosclerosis of native arteries of left leg with ulceration of other part of foot: Secondary | ICD-10-CM | POA: Insufficient documentation

## 2016-07-05 DIAGNOSIS — E11621 Type 2 diabetes mellitus with foot ulcer: Secondary | ICD-10-CM | POA: Insufficient documentation

## 2016-07-05 DIAGNOSIS — I70229 Atherosclerosis of native arteries of extremities with rest pain, unspecified extremity: Secondary | ICD-10-CM

## 2016-07-05 HISTORY — PX: ABDOMINAL AORTOGRAM W/LOWER EXTREMITY: CATH118223

## 2016-07-05 HISTORY — PX: PERIPHERAL VASCULAR BALLOON ANGIOPLASTY: CATH118281

## 2016-07-05 LAB — POCT I-STAT, CHEM 8
BUN: 25 mg/dL — ABNORMAL HIGH (ref 6–20)
Calcium, Ion: 1.03 mmol/L — ABNORMAL LOW (ref 1.15–1.40)
Chloride: 108 mmol/L (ref 101–111)
Creatinine, Ser: 0.9 mg/dL (ref 0.61–1.24)
Glucose, Bld: 242 mg/dL — ABNORMAL HIGH (ref 65–99)
HCT: 48 % (ref 39.0–52.0)
Hemoglobin: 16.3 g/dL (ref 13.0–17.0)
Potassium: 4.6 mmol/L (ref 3.5–5.1)
Sodium: 137 mmol/L (ref 135–145)
TCO2: 22 mmol/L (ref 0–100)

## 2016-07-05 LAB — POCT ACTIVATED CLOTTING TIME
Activated Clotting Time: 257 seconds
Activated Clotting Time: 213 seconds
Activated Clotting Time: 147 seconds

## 2016-07-05 LAB — GLUCOSE, CAPILLARY
Glucose-Capillary: 261 mg/dL — ABNORMAL HIGH (ref 65–99)
Glucose-Capillary: 168 mg/dL — ABNORMAL HIGH (ref 65–99)

## 2016-07-05 SURGERY — ABDOMINAL AORTOGRAM W/LOWER EXTREMITY
Anesthesia: LOCAL

## 2016-07-05 MED ORDER — HEPARIN (PORCINE) IN NACL 2-0.9 UNIT/ML-% IJ SOLN
INTRAMUSCULAR | Status: AC | PRN
  Administered 2016-07-05: 1000 mL

## 2016-07-05 MED ORDER — MIDAZOLAM HCL 2 MG/2ML IJ SOLN
INTRAMUSCULAR | Status: DC | PRN
  Administered 2016-07-05: 1 mg via INTRAVENOUS

## 2016-07-05 MED ORDER — MIDAZOLAM HCL 2 MG/2ML IJ SOLN
INTRAMUSCULAR | Status: AC
  Filled 2016-07-05: qty 2

## 2016-07-05 MED ORDER — HYDRALAZINE HCL 20 MG/ML IJ SOLN: 10.0000 mg | Freq: Once | INTRAMUSCULAR | Status: DC

## 2016-07-05 MED ORDER — HYDRALAZINE HCL 20 MG/ML IJ SOLN
10.0000 mg | INTRAMUSCULAR | Status: DC | PRN
  Administered 2016-07-05: 10 mg via INTRAVENOUS

## 2016-07-05 MED ORDER — OXYCODONE-ACETAMINOPHEN 5-325 MG PO TABS: 1.0000 | ORAL_TABLET | Freq: Four times a day (QID) | ORAL | Status: DC | PRN

## 2016-07-05 MED ORDER — SODIUM CHLORIDE 0.9 % IV SOLN
INTRAVENOUS | Status: DC
  Administered 2016-07-05: 09:00:00 via INTRAVENOUS

## 2016-07-05 MED ORDER — HYDRALAZINE HCL 20 MG/ML IJ SOLN
INTRAMUSCULAR | Status: AC
  Filled 2016-07-05: qty 1

## 2016-07-05 MED ORDER — HEPARIN SODIUM (PORCINE) 1000 UNIT/ML IJ SOLN
INTRAMUSCULAR | Status: AC
  Filled 2016-07-05: qty 1

## 2016-07-05 MED ORDER — CLOPIDOGREL BISULFATE 75 MG PO TABS: 75.0000 mg | ORAL_TABLET | Freq: Every day | ORAL | 11 refills | Status: DC

## 2016-07-05 MED ORDER — LIDOCAINE HCL 1 % IJ SOLN
INTRAMUSCULAR | Status: AC
  Filled 2016-07-05: qty 20

## 2016-07-05 MED ORDER — CLOPIDOGREL BISULFATE 300 MG PO TABS
ORAL_TABLET | ORAL | Status: DC | PRN
  Administered 2016-07-05: 300 mg via ORAL

## 2016-07-05 MED ORDER — LIDOCAINE HCL (PF) 1 % IJ SOLN
INTRAMUSCULAR | Status: DC | PRN
  Administered 2016-07-05: 20 mL

## 2016-07-05 MED ORDER — IODIXANOL 320 MG/ML IV SOLN
INTRAVENOUS | Status: DC | PRN
  Administered 2016-07-05: 140 mL via INTRA_ARTERIAL

## 2016-07-05 MED ORDER — CLOPIDOGREL BISULFATE 300 MG PO TABS
ORAL_TABLET | ORAL | Status: AC
  Filled 2016-07-05: qty 1

## 2016-07-05 MED ORDER — HEPARIN SODIUM (PORCINE) 1000 UNIT/ML IJ SOLN
INTRAMUSCULAR | Status: DC | PRN
  Administered 2016-07-05: 9000 [IU] via INTRAVENOUS

## 2016-07-05 MED ORDER — SODIUM CHLORIDE 0.9 % IV SOLN: 1.0000 mL/kg/h | INTRAVENOUS | Status: DC

## 2016-07-05 MED ORDER — FENTANYL CITRATE (PF) 100 MCG/2ML IJ SOLN
INTRAMUSCULAR | Status: DC | PRN
  Administered 2016-07-05: 50 ug via INTRAVENOUS

## 2016-07-05 MED ORDER — FENTANYL CITRATE (PF) 100 MCG/2ML IJ SOLN
INTRAMUSCULAR | Status: AC
  Filled 2016-07-05: qty 2

## 2016-07-05 MED ORDER — CLOPIDOGREL BISULFATE 75 MG PO TABS: 300.0000 mg | ORAL_TABLET | Freq: Once | ORAL | Status: DC

## 2016-07-05 SURGICAL SUPPLY — 22 items
BALLN ARMADA 35LL 5X200X135 (BALLOONS) ×3
BALLOON ARMADA 35LL 5X200X135 (BALLOONS) ×2 IMPLANT
CATH MUSTANG 3X200X135 (BALLOONS) ×3 IMPLANT
CATH OMNI FLUSH 5F 65CM (CATHETERS) ×3 IMPLANT
CATH QUICKCROSS .035X135CM (MICROCATHETER) ×3 IMPLANT
COVER PRB 48X5XTLSCP FOLD TPE (BAG) ×2 IMPLANT
COVER PROBE 5X48 (BAG) ×1
DEVICE CONTINUOUS FLUSH (MISCELLANEOUS) ×3 IMPLANT
DEVICE TORQUE .025-.038 (MISCELLANEOUS) ×3 IMPLANT
GUIDEWIRE ANGLED .035X260CM (WIRE) ×3 IMPLANT
KIT ENCORE 26 ADVANTAGE (KITS) ×3 IMPLANT
KIT PV (KITS) ×3 IMPLANT
SHEATH PINNACLE 5F 10CM (SHEATH) ×3 IMPLANT
SHEATH PINNACLE ST 6F 45CM (SHEATH) ×3 IMPLANT
SYR MEDRAD MARK V 150ML (SYRINGE) ×3 IMPLANT
TAPE VIPERTRACK RADIOPAQ (MISCELLANEOUS) ×2 IMPLANT
TAPE VIPERTRACK RADIOPAQUE (MISCELLANEOUS) ×1
TRANSDUCER W/STOPCOCK (MISCELLANEOUS) ×3 IMPLANT
TRAY PV CATH (CUSTOM PROCEDURE TRAY) ×3 IMPLANT
WIRE BENTSON .035X145CM (WIRE) ×3 IMPLANT
WIRE HI TORQ VERSACORE J 260CM (WIRE) ×3 IMPLANT
WIRE ROSEN-J .035X180CM (WIRE) ×3 IMPLANT

## 2016-07-05 NOTE — Interval H&P Note (Signed)
   History and Physical Update  The patient was interviewed and re-examined.  The patient's previous History and Physical has been reviewed and is unchanged.  There is no change in the plan of care: aortogram, bilateral leg runoff, and possible left leg intervention.   I discussed with the patient the nature of angiographic procedures, especially the limited patencies of any endovascular intervention.    The patient is aware of that the risks of an angiographic procedure include but are not limited to: bleeding, infection, access site complications, renal failure, embolization, rupture of vessel, dissection, arteriovenous fistula, possible need for emergent surgical intervention, possible need for surgical procedures to treat the patient's pathology, anaphylactic reaction to contrast, and stroke and death.    The patient is aware of the risks and agrees to proceed.   BMET    Component Value Date/Time   NA 137 07/05/2016 0839   K 4.6 07/05/2016 0839   CL 108 07/05/2016 0839   CO2 22 03/05/2016 1039   GLUCOSE 242 (H) 07/05/2016 0839   BUN 25 (H) 07/05/2016 0839   CREATININE 0.90 07/05/2016 0839   CALCIUM 9.3 03/05/2016 1039   GFRNONAA >60 03/05/2016 1039   GFRAA >60 03/05/2016 1039    Leonides SakeBrian Erlene Devita, MD, FACS [facility] Office: [phone] Pager: [phone]  07/05/2016, 10:03 AM

## 2016-07-05 NOTE — H&P (View-Only) (Signed)
Referred by:  Willie Stabile, MD [address] [place], [place] [address]  Reason for referral: left foot ulcers   History of Present Illness   Willie Morales is a 56 y.o. (December 23, 1960) male who presents with chief complaint: left foot pain and ulcers.  Onset of pain a few months ago with development of wounds over the last 1-2 months.  Pain is described as sharp, severity 5-10/10, and associated with ambulation and now rest.  Patient has attempted to treat this pain with OTC medications.  The patient has left foot rest pain symptoms also and blisters and wounds on L foot.  Atherosclerotic risk factors include: DM, HLD, HTN and active smoking.  Past Medical History:  Diagnosis Date  . Coronary artery disease   . Diabetes mellitus   . Dyslipidemia   . History of kidney stones   . Hypertension   . Paroxysmal atrial fibrillation (HCC) 12/2010  . Tobacco abuse     Past Surgical History:  Procedure Laterality Date  . BACK SURGERY  1991  . CARDIAC CATHETERIZATION  07/31/2007   This showed nonobstructive coronary artery disease   He had 30% lesion in the mid RCA, 30% lesion in the PDA, 45% lesion in the LAD and mid  LAD, and 30% lesion in the diagonal.  The left circumflex was normal.  The LV function was 60%.     Marland Kitchen KIDNEY STONE SURGERY  2011    Social History   Social History  . Marital status: Married    Spouse name: N/A  . Number of children: N/A  . Years of education: N/A   Occupational History  . plumber     by trade   Social History Main Topics  . Smoking status: Current Every Day Smoker    Packs/day: 1.00    Years: 35.00    Types: Cigarettes    Last attempt to quit: 12/18/2015  . Smokeless tobacco: Never Used     Comment: quit "3 months ago"  . Alcohol use No  . Drug use: No  . Sexual activity: Not on file   Other Topics Concern  . Not on file   Social History Narrative   Has 2 children    Lives in [place] with wife   Currently unemployed    Family  History  Problem Relation Age of Onset  . Coronary artery disease Father     CABG in early 6's  . Heart attack Father   . Cancer Mother   . Diabetes Maternal Grandmother   . SIDS Sister     Current Outpatient Prescriptions  Medication Sig Dispense Refill  . apixaban (ELIQUIS) 5 MG TABS tablet Take 1 tablet (5 mg total) by mouth 2 (two) times daily. 60 tablet 6  . aspirin 81 MG chewable tablet Chew 81 mg by mouth daily.    . benazepril (LOTENSIN) 10 MG tablet Take 1 tablet (10 mg total) by mouth daily. 30 tablet 6  . glimepiride (AMARYL) 4 MG tablet Take 1 tablet (4 mg total) by mouth daily with breakfast. 30 tablet 6  . HYDROcodone-acetaminophen (NORCO/VICODIN) 5-325 MG tablet Take one-two tabs po q 4-6 hrs prn pain 15 tablet 0  . metFORMIN (GLUCOPHAGE) 500 MG tablet Take 2 tablets (1,000 mg total) by mouth 3 (three) times daily. 90 tablet 6  . metoprolol tartrate (LOPRESSOR) 25 MG tablet Take 1 tablet (25 mg total) by mouth 2 (two) times daily. 60 tablet 6  . cephALEXin (KEFLEX) 500  MG capsule   0  . gabapentin (NEURONTIN) 300 MG capsule Take 1 capsule (300 mg total) by mouth 3 (three) times daily. (Patient not taking: Reported on 06/27/2016) 30 capsule 0   No current facility-administered medications for this visit.     Allergies  Allergen Reactions  . Ciprofloxacin Rash     REVIEW OF SYSTEMS:   Cardiac:  positive for: no symptoms, negative for: Chest pain or chest pressure, Shortness of breath upon exertion and Shortness of breath when lying flat,   Vascular:  positive for: pain with ambulation and rest, leg swelling,  negative for: Blood clot in your veins  Pulmonary:  positive for: no symptoms,  negative for: Oxygen at home, Productive cough and Wheezing  Neurologic:  positive for: Sudden weakness in arms or legs, negative for: Sudden numbness in arms or legs, Sudden onset of difficulty speaking or slurred speech, Temporary loss of vision in one eye and Problems  with dizziness  Gastrointestinal:  positive for: no symptoms, negative for: Blood in stool and Vomited blood  Genitourinary:  positive for: no symptoms, negative for: Burning when urinating and Blood in urine  Psychiatric:  positive for: no symptoms,  negative for: Major depression  Hematologic:  positive for: no symptoms,  negative for: negative for: Bleeding problems and Problems with blood clotting too easily  Dermatologic:  positive for: no symptoms, negative for: Rashes or ulcers  Constitutional:  positive for: no symptoms, negative for: Fever or chills  Ear/Nose/Throat:  positive for: no symptoms, negative for: Change in hearing, Nose bleeds and Sore throat  Musculoskeletal:  positive for: no symptoms, negative for: Back pain, Joint pain and Muscle pain   For VQI Use Only   PRE-ADM LIVING: Home  AMB STATUS: Ambulatory  CAD Sx: None  PRIOR CHF: None  STRESS TEST: No   Physical Examination   Vitals:   06/27/16 1150  BP: 124/84  Pulse: 82  Resp: 18  Temp: 97.4 F (36.3 C)  SpO2: 100%  Weight: 188 lb (85.3 kg)  Height: 6' (1.829 m)    Body mass index is 25.5 kg/m.  General Alert, O x 3, WD, NAD  Head Colman/AT,    Ear/Nose/Throat Hearing grossly intact, nares without erythema or drainage, oropharynx without Erythema or Exudate, Mallampati score: 3, Dentition intact  Eyes PERRLA, EOMI,    Neck Supple, mid-line trachea,    Pulmonary Sym exp, good B air movt, CTA B  Cardiac RRR, Nl S1, S2, no Murmurs, No rubs, No S3,S4  Vascular Vessel Right Left  Radial Palpable Palpable  Brachial Palpable Palpable  Carotid Palpable, No Bruit Palpable, No Bruit  Aorta Not palpable N/A  Femoral Palpable Palpable  Popliteal Not palpable Not palpable  PT Not palpable Not palpable  DP Not palpable Not palpable    Gastrointestinal soft, non-distended, non-tender to palpation, No guarding or rebound, no HSM, no masses, no CVAT B, No palpable prominent aortic  pulse,    Musculoskeletal M/S 5/5 throughout, R Extremities without ischemic changes, L foot with scatters small ulcers with some black eschar, healing blister on L 5th MT laterally, No edema present, Varicosities present: B (small), No Lipodermatosclerosis present  Neurologic Cranial nerves 2-12 intact, Pain and light touch intact in extremities, Motor exam as listed above  Psychiatric Judgement intact, Mood & affect appropriate for pt's clinical situation  Dermatologic See M/S exam for extremity exam, No rashes otherwise noted  Lymphatic  Palpable lymph nodes: None    Non-Invasive Vascular Imaging  Outside BLE Physiologic Study (05/31/16)  R ABI: 0.64,   L ABI: 0.61  BLE Arterial duplex (06/27/16)  R:  Triphasic CFA and PFA  Monophasic throughout remaining  Distal SFA stenosis: PSV 482 c/s  L:  Biphasic CFA and PFA  Monophasic throughout remaining  Distal SFA stenosis: PSV 344 c/s  Imaging suggest occlusion of distal SFA revasc   Outside Studies/Documentation   6 pages of outside documents were reviewed including: outside physiologic studies, outside PCP studies.   Medical Decision Making   Willie Morales is a 56 y.o. male who presents with: LLE critical limb ischemia with ulcers, RLE moderate PAD, poorly controlled DM, afib   I discussed with the patient the natural history of critical limb ischemia: 25% require amputation in one year, 50% are able to maintain their limbs in one year, and 25-30% die in one year due to comorbidities.  Given the limb threatening status of this patient, I recommend an aggressive work up including proceeding with an: Aortogram, Bilateral runoff and possible Left leg intervention. I discussed with the patient the nature of angiographic procedures, especially the limited patencies of any endovascular intervention. The patient is aware of that the risks of an angiographic procedure include but are not limited to: bleeding, infection, access  site complications, embolization, rupture of treated vessel, dissection, possible need for emergent surgical intervention, and possible need for surgical procedures to treat the patient's pathology. The patient is aware of the risks and agrees to proceed.  The procedure is scheduled for: 17 MAY 18.  I discussed in depth with the patient the nature of atherosclerosis, and emphasized the importance of maximal medical management including strict control of blood pressure, blood glucose, and lipid levels, antiplatelet agents, obtaining regular exercise, and cessation of smoking.  The patient is aware that without maximal medical management the underlying atherosclerotic disease process will progress, limiting the benefit of any interventions. The patient is currently not on statin.  will obtain lipid panel prior to proceeding.  The patient is currently on an anti-platelet: ASA.  Pt is also on Eliquis for his afib.  Thank you for allowing us to participate in this patient's care.   Willie SakeBrian Chen, MD, FACS [facility] Office: [phone] Pager: [phone]  06/27/2016, 12:10 PM

## 2016-07-05 NOTE — Progress Notes (Signed)
Verified with pharmacist ie  Verner MouldLisa Powell,RPH  that pt ok to take plavix with his other home meds including Eliquis

## 2016-07-05 NOTE — Progress Notes (Signed)
Dr. Imogene Burnhen notified of pt's BP/ no new orders recieved

## 2016-07-05 NOTE — Discharge Instructions (Signed)

## 2016-07-05 NOTE — Op Note (Signed)
OPERATIVE NOTE   PROCEDURE: 1.  Right common femoral artery cannulation under ultrasound guidance 2.  Placement of catheter in aorta 3.  Aortogram 4.  Second order arterial selection 5.  Left leg runoff via catheter 6.  Left superficial femoral artery subintimal angioplasty x 2 (3 mm x 200 mm, 5 mm x 200 mm) 7.  Right leg runoff via sheath 8.  Conscious sedation for 65 minutes  PRE-OPERATIVE DIAGNOSIS: left foot ulcers  POST-OPERATIVE DIAGNOSIS: same as above   SURGEON: Adele Barthel, MD  ANESTHESIA: conscious sedation  ESTIMATED BLOOD LOSS: 50 cc  CONTRAST: 140 cc  FINDING(S):  Aorta: patent, calcified throughout without obvious hemodynamically significant stenoses  Superior mesenteric artery: patent Celiac artery: not visualized   Right Left  RA Patent, <30% proximal stenosis Patent, minimal proximal stenosis  CIA patent patent  EIA patent Patent, <50% stenosis  IIA patent occluded  CFA patent patent  SFA Patent, small with distal stenosis >90% Patent, diffusely attenuated with some segment with <2 mm lumens, distal 1/3 occlusion for 5 cm: resolution of proximal SFA diffuse stenosis and occlusion with serial angioplasty  PFA patent Patent, extensive hypertrophied collaterals  Pop Patent, with 2 cm stenosis in above-the-knee segment >90% patent  Trif patent patent  AT patent patent  Pero patent patent  PT patent patent   SPECIMEN(S):  none  INDICATIONS:   Willie Morales is a 56 y.o. male who presents with left foot ulcers and non-invasive studies suggestive of short segment left superficial femoral artery occlusion.  The patient presents for: aortogram, bilateral leg runoff, and possible left leg intervention.  I discussed with the patient the nature of angiographic procedures, especially the limited patencies of any endovascular intervention.  The patient is aware of that the risks of an angiographic procedure include but are not limited to: bleeding, infection,  access site complications, renal failure, embolization, rupture of vessel, dissection, possible need for emergent surgical intervention, possible need for surgical procedures to treat the patient's pathology, and stroke and death.  The patient is aware of the risks and agrees to proceed.  DESCRIPTION: After full informed consent was obtained from the patient, the patient was brought back to the angiography suite.  The patient was placed supine upon the angiography table and connected to cardiopulmonary monitoring equipment.  The patient was then given conscious sedation, the amounts of which are documented in the patient's chart.  A circulating radiologic technician maintained continuous monitoring of the patient's cardiopulmonary status.  Additionally, the control room radiologic technician provided backup monitoring throughout the procedure.  The patient was prepped and drape in the standard fashion for an angiographic procedure.  At this point, attention was turned to the right groin.  Under ultrasound guidance,  The subcutaneous tissue surrounding the right common femoral artery was anesthesized with 1% lidocaine with epinephrine.  The artery was then cannulated with a 18 gauge needle.  The Bentson wire was passed up into the aorta.  The needle was exchanged for a 5-Fr sheath, which was advanced over the wire into the common femoral artery.  The dilator was then removed.  The Omniflush catheter was then loaded over the wire up to the level of L1.  The catheter was connected to the power injector circuit.  After de-airring and de-clotting the circuit, a power injector aortogram was completed.  The findings are listed above.  Using a Bentson wire and Omniflush catheter, the left common iliac artery was selected.  The catheter and wire were  advanced into the external iliac artery.  The wire was removed and then an automated left leg runoff was completed via the catheter.  The findings are listed above.   Based on the images, I felt an attempt at recannulation of the superficial femoral artery was indicated due the short length and the need for femoropopliteal bypass otherwise.  The wire was exchanged for a Rosen wire.  The patient's right femoral sheath was exchanged for a 6-Fr Destination sheath, which was lodged in the left common femoral artery.  The dilator was removed.  The patient was given 9000 units of Heparin intravenously, which was a therapeutic bolus.  I did hand injections to verify that the left superficial femoral artery was diffusely diseased and to determine the location of the chronic total occlusion.  Using a Quickcross catheter and Glidewire, I was able to get down the occlusion.  Using a subintimal technique, I was able to cross the occlusion with this combination of wire and catheter and re-enter into the above-the-knee popliteal artery.  I did hand injections to verify this.  The wire was exchanged for a Versacore wire.  I obtained a 3 mm x 200 mm balloon and then angioplastied the entire superficial femoral artery at 10 atm for 2 minutes.  I then exchanged this for a 5 mm x 200 mm balloon and then angioplastied the entire superficial femoral artery at 8 atm for 2 minuttes.  I deflated the balloon and removed it.  Hand injection demonstrated possible calcific plaque vs non-flow limiting dissection.  I replaced the 5 mm x 200 mm balloon and inflated it at 4 atm for 3 min along the entirety of this superficial femoral artery.  I deflated the balloon and removed it.  Completion injection demonstrated no flow limiting lesions, likely residual calcific plaque without dissection.  I then did a hand injection of the trifurcation, demonstrating not embolization.  I pulled the wire out and pulled the sheath back into the right external iliac artery.    The sheath was aspirated.  No clots were present and the sheath was reloaded with heparinized saline.  The right sheath was connected to the power  injector circuit.  An automated right leg runoff was completed.  The findings are listed above.  The plan is to load patient on 300 mg of Plavix with 75 mg of Plavix daily.  The sheath will be removed once the heparin reverses.    Will plan on intervening on right leg with an orbital atherectomy and drug coated angioplasty in two weeks.   COMPLICATIONS: none  CONDITION: stable   Adele Barthel, MD, [facility] [place] Office: [phone] Pager: [phone]  07/05/2016, 12:08 PM

## 2016-07-06 ENCOUNTER — Encounter (HOSPITAL_COMMUNITY): Payer: Self-pay | Admitting: Vascular Surgery

## 2016-07-10 ENCOUNTER — Other Ambulatory Visit: Payer: Self-pay

## 2016-07-19 ENCOUNTER — Ambulatory Visit (HOSPITAL_COMMUNITY)
Admission: RE | Admit: 2016-07-19 | Discharge: 2016-07-19 | Disposition: A | Discharge status: Home / Self Care | Payer: BLUE CROSS/BLUE SHIELD | Source: Ambulatory Visit | Attending: Vascular Surgery | Admitting: Vascular Surgery

## 2016-07-19 ENCOUNTER — Encounter (HOSPITAL_COMMUNITY)
Admission: RE | Disposition: A | Discharge status: Home / Self Care | Payer: Self-pay | Source: Ambulatory Visit | Attending: Vascular Surgery

## 2016-07-19 ENCOUNTER — Telehealth: Payer: Self-pay | Admitting: Vascular Surgery

## 2016-07-19 DIAGNOSIS — I70229 Atherosclerosis of native arteries of extremities with rest pain, unspecified extremity: Secondary | ICD-10-CM | POA: Diagnosis present

## 2016-07-19 DIAGNOSIS — Z87442 Personal history of urinary calculi: Secondary | ICD-10-CM | POA: Insufficient documentation

## 2016-07-19 DIAGNOSIS — Z809 Family history of malignant neoplasm, unspecified: Secondary | ICD-10-CM | POA: Insufficient documentation

## 2016-07-19 DIAGNOSIS — Z79899 Other long term (current) drug therapy: Secondary | ICD-10-CM | POA: Insufficient documentation

## 2016-07-19 DIAGNOSIS — I2510 Atherosclerotic heart disease of native coronary artery without angina pectoris: Secondary | ICD-10-CM | POA: Diagnosis not present

## 2016-07-19 DIAGNOSIS — I10 Essential (primary) hypertension: Secondary | ICD-10-CM | POA: Insufficient documentation

## 2016-07-19 DIAGNOSIS — Z7984 Long term (current) use of oral hypoglycemic drugs: Secondary | ICD-10-CM | POA: Diagnosis not present

## 2016-07-19 DIAGNOSIS — I8393 Asymptomatic varicose veins of bilateral lower extremities: Secondary | ICD-10-CM | POA: Insufficient documentation

## 2016-07-19 DIAGNOSIS — E785 Hyperlipidemia, unspecified: Secondary | ICD-10-CM | POA: Diagnosis not present

## 2016-07-19 DIAGNOSIS — Z833 Family history of diabetes mellitus: Secondary | ICD-10-CM | POA: Insufficient documentation

## 2016-07-19 DIAGNOSIS — Z8249 Family history of ischemic heart disease and other diseases of the circulatory system: Secondary | ICD-10-CM | POA: Insufficient documentation

## 2016-07-19 DIAGNOSIS — Z7901 Long term (current) use of anticoagulants: Secondary | ICD-10-CM | POA: Diagnosis not present

## 2016-07-19 DIAGNOSIS — Z7982 Long term (current) use of aspirin: Secondary | ICD-10-CM | POA: Insufficient documentation

## 2016-07-19 DIAGNOSIS — I480 Paroxysmal atrial fibrillation: Secondary | ICD-10-CM | POA: Insufficient documentation

## 2016-07-19 DIAGNOSIS — E1151 Type 2 diabetes mellitus with diabetic peripheral angiopathy without gangrene: Secondary | ICD-10-CM | POA: Insufficient documentation

## 2016-07-19 DIAGNOSIS — F17210 Nicotine dependence, cigarettes, uncomplicated: Secondary | ICD-10-CM | POA: Diagnosis not present

## 2016-07-19 DIAGNOSIS — Z881 Allergy status to other antibiotic agents status: Secondary | ICD-10-CM | POA: Insufficient documentation

## 2016-07-19 DIAGNOSIS — I7025 Atherosclerosis of native arteries of other extremities with ulceration: Secondary | ICD-10-CM | POA: Diagnosis not present

## 2016-07-19 HISTORY — PX: PERIPHERAL VASCULAR ATHERECTOMY: CATH118256

## 2016-07-19 LAB — GLUCOSE, CAPILLARY: Glucose-Capillary: 170 mg/dL — ABNORMAL HIGH (ref 65–99)

## 2016-07-19 LAB — POCT ACTIVATED CLOTTING TIME
Activated Clotting Time: 241 seconds
Activated Clotting Time: 208 seconds
Activated Clotting Time: 164 seconds

## 2016-07-19 LAB — POCT I-STAT, CHEM 8
BUN: 34 mg/dL — ABNORMAL HIGH (ref 6–20)
Calcium, Ion: 0.99 mmol/L — ABNORMAL LOW (ref 1.15–1.40)
Chloride: 110 mmol/L (ref 101–111)
Creatinine, Ser: 1 mg/dL (ref 0.61–1.24)
Glucose, Bld: 185 mg/dL — ABNORMAL HIGH (ref 65–99)
HCT: 48 % (ref 39.0–52.0)
Hemoglobin: 16.3 g/dL (ref 13.0–17.0)
Potassium: 5.5 mmol/L — ABNORMAL HIGH (ref 3.5–5.1)
Sodium: 136 mmol/L (ref 135–145)
TCO2: 23 mmol/L (ref 0–100)

## 2016-07-19 SURGERY — PERIPHERAL VASCULAR ATHERECTOMY
Anesthesia: LOCAL | Laterality: Right

## 2016-07-19 MED ORDER — HYDRALAZINE HCL 20 MG/ML IJ SOLN
INTRAMUSCULAR | Status: AC
  Filled 2016-07-19: qty 1

## 2016-07-19 MED ORDER — LIDOCAINE HCL 1 % IJ SOLN
INTRAMUSCULAR | Status: AC
  Filled 2016-07-19: qty 20

## 2016-07-19 MED ORDER — MIDAZOLAM HCL 2 MG/2ML IJ SOLN
INTRAMUSCULAR | Status: AC
  Filled 2016-07-19: qty 2

## 2016-07-19 MED ORDER — VIPERSLIDE LUBRICANT OPTIME
TOPICAL | Status: DC | PRN
  Administered 2016-07-19: 08:00:00 via SURGICAL_CAVITY

## 2016-07-19 MED ORDER — FENTANYL CITRATE (PF) 100 MCG/2ML IJ SOLN
INTRAMUSCULAR | Status: AC
  Filled 2016-07-19: qty 2

## 2016-07-19 MED ORDER — SODIUM CHLORIDE 0.9 % IV SOLN: 1.0000 mL/kg/h | INTRAVENOUS | Status: DC

## 2016-07-19 MED ORDER — NITROGLYCERIN 1 MG/10 ML FOR IR/CATH LAB
INTRA_ARTERIAL | Status: DC | PRN
Start: 2016-07-19 — End: 2016-07-19
  Administered 2016-07-19 (×4): 200 ug via INTRA_ARTERIAL

## 2016-07-19 MED ORDER — HEPARIN SODIUM (PORCINE) 1000 UNIT/ML IJ SOLN
INTRAMUSCULAR | Status: DC | PRN
  Administered 2016-07-19: 1000 [IU] via INTRAVENOUS
  Administered 2016-07-19: 8500 [IU] via INTRAVENOUS

## 2016-07-19 MED ORDER — HEPARIN (PORCINE) IN NACL 2-0.9 UNIT/ML-% IJ SOLN
INTRAMUSCULAR | Status: AC
  Filled 2016-07-19: qty 500

## 2016-07-19 MED ORDER — FENTANYL CITRATE (PF) 100 MCG/2ML IJ SOLN
INTRAMUSCULAR | Status: DC | PRN
  Administered 2016-07-19: 50 ug via INTRAVENOUS

## 2016-07-19 MED ORDER — HEPARIN SODIUM (PORCINE) 1000 UNIT/ML IJ SOLN
INTRAMUSCULAR | Status: AC
  Filled 2016-07-19: qty 1

## 2016-07-19 MED ORDER — OXYCODONE-ACETAMINOPHEN 5-325 MG PO TABS: 1.0000 | ORAL_TABLET | ORAL | Status: DC | PRN

## 2016-07-19 MED ORDER — HYDRALAZINE HCL 20 MG/ML IJ SOLN
INTRAMUSCULAR | Status: AC
  Administered 2016-07-19: 10 mg via INTRAVENOUS
  Filled 2016-07-19: qty 1

## 2016-07-19 MED ORDER — NITROGLYCERIN IN D5W 200-5 MCG/ML-% IV SOLN
INTRAVENOUS | Status: AC
  Filled 2016-07-19: qty 250

## 2016-07-19 MED ORDER — MIDAZOLAM HCL 2 MG/2ML IJ SOLN
INTRAMUSCULAR | Status: DC | PRN
  Administered 2016-07-19: 1 mg via INTRAVENOUS

## 2016-07-19 MED ORDER — OXYCODONE-ACETAMINOPHEN 5-325 MG PO TABS: 1.0000 | ORAL_TABLET | Freq: Four times a day (QID) | ORAL | 0 refills | Status: DC | PRN

## 2016-07-19 MED ORDER — HYDRALAZINE HCL 20 MG/ML IJ SOLN
10.0000 mg | INTRAMUSCULAR | Status: DC | PRN
  Administered 2016-07-19 (×2): 10 mg via INTRAVENOUS

## 2016-07-19 MED ORDER — HEPARIN (PORCINE) IN NACL 2-0.9 UNIT/ML-% IJ SOLN
INTRAMUSCULAR | Status: AC | PRN
  Administered 2016-07-19: 1000 mL

## 2016-07-19 MED ORDER — LIDOCAINE HCL (PF) 1 % IJ SOLN
INTRAMUSCULAR | Status: DC | PRN
  Administered 2016-07-19: 20 mL

## 2016-07-19 MED ORDER — SODIUM CHLORIDE 0.9 % IV SOLN
INTRAVENOUS | Status: DC
  Administered 2016-07-19: 07:00:00 via INTRAVENOUS

## 2016-07-19 MED ORDER — VERAPAMIL HCL 2.5 MG/ML IV SOLN
INTRAVENOUS | Status: AC
  Filled 2016-07-19: qty 2

## 2016-07-19 MED ORDER — IODIXANOL 320 MG/ML IV SOLN
INTRAVENOUS | Status: DC | PRN
  Administered 2016-07-19: 140 mL via INTRA_ARTERIAL

## 2016-07-19 SURGICAL SUPPLY — 32 items
BALLN IN.PACT DCB 4X150 (BALLOONS) ×2
CATH OMNI FLUSH 5F 65CM (CATHETERS) ×2 IMPLANT
CATH QUICKCROSS .035X135CM (MICROCATHETER) ×2 IMPLANT
CATH SOFT-VU 4F 65 STRAIGHT (CATHETERS) ×1 IMPLANT
CATH SOFT-VU STRAIGHT 4F 65CM (CATHETERS) ×1
COVER DOME SNAP 22 D (MISCELLANEOUS) ×2 IMPLANT
COVER PRB 48X5XTLSCP FOLD TPE (BAG) ×1 IMPLANT
COVER PROBE 5X48 (BAG) ×1
DCB IN.PACT 4X150 (BALLOONS) ×1 IMPLANT
DEVICE CONTINUOUS FLUSH (MISCELLANEOUS) ×2 IMPLANT
DEVICE TORQUE .025-.038 (MISCELLANEOUS) ×2 IMPLANT
DIAMONDBACK CLASSIC OAS 2.0MM (CATHETERS) ×2
GUIDEWIRE ANGLED .035X150CM (WIRE) ×2 IMPLANT
GUIDEWIRE ANGLED .035X260CM (WIRE) ×2 IMPLANT
KIT ENCORE 26 ADVANTAGE (KITS) ×2 IMPLANT
KIT MICROINTRODUCER STIFF 5F (SHEATH) ×2 IMPLANT
KIT PV (KITS) ×2 IMPLANT
LUBRICANT VIPERSLIDE CORONARY (MISCELLANEOUS) ×2 IMPLANT
SHEATH PINNACLE 5F 10CM (SHEATH) ×2 IMPLANT
SHEATH PINNACLE 6F 10CM (SHEATH) ×2 IMPLANT
SHEATH PINNACLE ST 6F 45CM (SHEATH) ×2 IMPLANT
SLEEVE REPOSITIONING LENGTH 30 (MISCELLANEOUS) ×2 IMPLANT
SYR MEDRAD MARK V 150ML (SYRINGE) ×2 IMPLANT
SYSTEM DIMODBCK CLSC OAS 2.0MM (CATHETERS) ×1 IMPLANT
TAPE VIPERTRACK RADIOPAQ (MISCELLANEOUS) ×1 IMPLANT
TAPE VIPERTRACK RADIOPAQUE (MISCELLANEOUS) ×1
TRANSDUCER W/STOPCOCK (MISCELLANEOUS) ×2 IMPLANT
TRAY PV CATH (CUSTOM PROCEDURE TRAY) ×2 IMPLANT
WIRE BENTSON .035X145CM (WIRE) ×2 IMPLANT
WIRE ROSEN-J .035X180CM (WIRE) ×2 IMPLANT
WIRE SPARTACORE .014X300CM (WIRE) ×2 IMPLANT
WIRE VIPER ADVANCE .017X335CM (WIRE) ×2 IMPLANT

## 2016-07-19 NOTE — Telephone Encounter (Signed)
Sched appt 08/17/16 at 9:30. Lm on hm# for pt to confirm appt.

## 2016-07-19 NOTE — Progress Notes (Signed)
10 mg IV Hydralazine given at 1154.

## 2016-07-19 NOTE — H&P (View-Only) (Signed)
Referred by:  Benita Stabile, MD [address] [place], [place] [address]  Reason for referral: left foot ulcers   History of Present Illness   Willie Morales is a 56 y.o. (December 23, 1960) male who presents with chief complaint: left foot pain and ulcers.  Onset of pain a few months ago with development of wounds over the last 1-2 months.  Pain is described as sharp, severity 5-10/10, and associated with ambulation and now rest.  Patient has attempted to treat this pain with OTC medications.  The patient has left foot rest pain symptoms also and blisters and wounds on L foot.  Atherosclerotic risk factors include: DM, HLD, HTN and active smoking.  Past Medical History:  Diagnosis Date  . Coronary artery disease   . Diabetes mellitus   . Dyslipidemia   . History of kidney stones   . Hypertension   . Paroxysmal atrial fibrillation (HCC) 12/2010  . Tobacco abuse     Past Surgical History:  Procedure Laterality Date  . BACK SURGERY  1991  . CARDIAC CATHETERIZATION  07/31/2007   This showed nonobstructive coronary artery disease   He had 30% lesion in the mid RCA, 30% lesion in the PDA, 45% lesion in the LAD and mid  LAD, and 30% lesion in the diagonal.  The left circumflex was normal.  The LV function was 60%.     Marland Kitchen KIDNEY STONE SURGERY  2011    Social History   Social History  . Marital status: Married    Spouse name: N/A  . Number of children: N/A  . Years of education: N/A   Occupational History  . plumber     by trade   Social History Main Topics  . Smoking status: Current Every Day Smoker    Packs/day: 1.00    Years: 35.00    Types: Cigarettes    Last attempt to quit: 12/18/2015  . Smokeless tobacco: Never Used     Comment: quit "3 months ago"  . Alcohol use No  . Drug use: No  . Sexual activity: Not on file   Other Topics Concern  . Not on file   Social History Narrative   Has 2 children    Lives in [place] with wife   Currently unemployed    Family  History  Problem Relation Age of Onset  . Coronary artery disease Father     CABG in early 6's  . Heart attack Father   . Cancer Mother   . Diabetes Maternal Grandmother   . SIDS Sister     Current Outpatient Prescriptions  Medication Sig Dispense Refill  . apixaban (ELIQUIS) 5 MG TABS tablet Take 1 tablet (5 mg total) by mouth 2 (two) times daily. 60 tablet 6  . aspirin 81 MG chewable tablet Chew 81 mg by mouth daily.    . benazepril (LOTENSIN) 10 MG tablet Take 1 tablet (10 mg total) by mouth daily. 30 tablet 6  . glimepiride (AMARYL) 4 MG tablet Take 1 tablet (4 mg total) by mouth daily with breakfast. 30 tablet 6  . HYDROcodone-acetaminophen (NORCO/VICODIN) 5-325 MG tablet Take one-two tabs po q 4-6 hrs prn pain 15 tablet 0  . metFORMIN (GLUCOPHAGE) 500 MG tablet Take 2 tablets (1,000 mg total) by mouth 3 (three) times daily. 90 tablet 6  . metoprolol tartrate (LOPRESSOR) 25 MG tablet Take 1 tablet (25 mg total) by mouth 2 (two) times daily. 60 tablet 6  . cephALEXin (KEFLEX) 500  MG capsule   0  . gabapentin (NEURONTIN) 300 MG capsule Take 1 capsule (300 mg total) by mouth 3 (three) times daily. (Patient not taking: Reported on 06/27/2016) 30 capsule 0   No current facility-administered medications for this visit.     Allergies  Allergen Reactions  . Ciprofloxacin Rash     REVIEW OF SYSTEMS:   Cardiac:  positive for: no symptoms, negative for: Chest pain or chest pressure, Shortness of breath upon exertion and Shortness of breath when lying flat,   Vascular:  positive for: pain with ambulation and rest, leg swelling,  negative for: Blood clot in your veins  Pulmonary:  positive for: no symptoms,  negative for: Oxygen at home, Productive cough and Wheezing  Neurologic:  positive for: Sudden weakness in arms or legs, negative for: Sudden numbness in arms or legs, Sudden onset of difficulty speaking or slurred speech, Temporary loss of vision in one eye and Problems  with dizziness  Gastrointestinal:  positive for: no symptoms, negative for: Blood in stool and Vomited blood  Genitourinary:  positive for: no symptoms, negative for: Burning when urinating and Blood in urine  Psychiatric:  positive for: no symptoms,  negative for: Major depression  Hematologic:  positive for: no symptoms,  negative for: negative for: Bleeding problems and Problems with blood clotting too easily  Dermatologic:  positive for: no symptoms, negative for: Rashes or ulcers  Constitutional:  positive for: no symptoms, negative for: Fever or chills  Ear/Nose/Throat:  positive for: no symptoms, negative for: Change in hearing, Nose bleeds and Sore throat  Musculoskeletal:  positive for: no symptoms, negative for: Back pain, Joint pain and Muscle pain   For VQI Use Only   PRE-ADM LIVING: Home  AMB STATUS: Ambulatory  CAD Sx: None  PRIOR CHF: None  STRESS TEST: No   Physical Examination   Vitals:   06/27/16 1150  BP: 124/84  Pulse: 82  Resp: 18  Temp: 97.4 F (36.3 C)  SpO2: 100%  Weight: 188 lb (85.3 kg)  Height: 6' (1.829 m)    Body mass index is 25.5 kg/m.  General Alert, O x 3, WD, NAD  Head Colman/AT,    Ear/Nose/Throat Hearing grossly intact, nares without erythema or drainage, oropharynx without Erythema or Exudate, Mallampati score: 3, Dentition intact  Eyes PERRLA, EOMI,    Neck Supple, mid-line trachea,    Pulmonary Sym exp, good B air movt, CTA B  Cardiac RRR, Nl S1, S2, no Murmurs, No rubs, No S3,S4  Vascular Vessel Right Left  Radial Palpable Palpable  Brachial Palpable Palpable  Carotid Palpable, No Bruit Palpable, No Bruit  Aorta Not palpable N/A  Femoral Palpable Palpable  Popliteal Not palpable Not palpable  PT Not palpable Not palpable  DP Not palpable Not palpable    Gastrointestinal soft, non-distended, non-tender to palpation, No guarding or rebound, no HSM, no masses, no CVAT B, No palpable prominent aortic  pulse,    Musculoskeletal M/S 5/5 throughout, R Extremities without ischemic changes, L foot with scatters small ulcers with some black eschar, healing blister on L 5th MT laterally, No edema present, Varicosities present: B (small), No Lipodermatosclerosis present  Neurologic Cranial nerves 2-12 intact, Pain and light touch intact in extremities, Motor exam as listed above  Psychiatric Judgement intact, Mood & affect appropriate for pt's clinical situation  Dermatologic See M/S exam for extremity exam, No rashes otherwise noted  Lymphatic  Palpable lymph nodes: None    Non-Invasive Vascular Imaging  Outside BLE Physiologic Study (05/31/16)  R ABI: 0.64,   L ABI: 0.61  BLE Arterial duplex (06/27/16)  R:  Triphasic CFA and PFA  Monophasic throughout remaining  Distal SFA stenosis: PSV 482 c/s  L:  Biphasic CFA and PFA  Monophasic throughout remaining  Distal SFA stenosis: PSV 344 c/s  Imaging suggest occlusion of distal SFA revasc   Outside Studies/Documentation   6 pages of outside documents were reviewed including: outside physiologic studies, outside PCP studies.   Medical Decision Making   Willie Morales is a 56 y.o. male who presents with: LLE critical limb ischemia with ulcers, RLE moderate PAD, poorly controlled DM, afib   I discussed with the patient the natural history of critical limb ischemia: 25% require amputation in one year, 50% are able to maintain their limbs in one year, and 25-30% die in one year due to comorbidities.  Given the limb threatening status of this patient, I recommend an aggressive work up including proceeding with an: Aortogram, Bilateral runoff and possible Left leg intervention. I discussed with the patient the nature of angiographic procedures, especially the limited patencies of any endovascular intervention. The patient is aware of that the risks of an angiographic procedure include but are not limited to: bleeding, infection, access  site complications, embolization, rupture of treated vessel, dissection, possible need for emergent surgical intervention, and possible need for surgical procedures to treat the patient's pathology. The patient is aware of the risks and agrees to proceed.  The procedure is scheduled for: 17 MAY 18.  I discussed in depth with the patient the nature of atherosclerosis, and emphasized the importance of maximal medical management including strict control of blood pressure, blood glucose, and lipid levels, antiplatelet agents, obtaining regular exercise, and cessation of smoking.  The patient is aware that without maximal medical management the underlying atherosclerotic disease process will progress, limiting the benefit of any interventions. The patient is currently not on statin.  will obtain lipid panel prior to proceeding.  The patient is currently on an anti-platelet: ASA.  Pt is also on Eliquis for his afib.  Thank you for allowing us to participate in this patient's care.   Leonides SakeBrian Chen, MD, FACS [facility] [place] Office: [phone] Pager: [phone]  06/27/2016, 12:10 PM

## 2016-07-19 NOTE — Op Note (Signed)
OPERATIVE NOTE   PROCEDURE: 1.  Left common femoral artery cannulation under ultrasound guidance 2.  Placement of catheter in aorta 3.  Aortogram 4.  Second order arterial selection 5.  Right leg runoff 6.  Orbital atherectomy right femoropopliteal artery x 2  7.  Drug coated angioplasty right femoropopliteal artery x 2 (4 mm x 150 mm) 8.  Conscious sedation for 89 minutes 9.  Intra-arterial administration of nitroglycerin  PRE-OPERATIVE DIAGNOSIS: sub-total occlusion of distal right superficial femoral artery and above-the-knee popliteal artery  POST-OPERATIVE DIAGNOSIS: same as above   SURGEON: Adele Barthel, MD  ANESTHESIA: conscious sedation  ESTIMATED BLOOD LOSS: 50 cc  CONTRAST: 148 cc  FINDING(S):  Widely patent right common femoral artery and profunda femoral artery  Patent right superficial femoral artery with focal distal sub-total occlusion: resolved with orbital atherectomy and drug coated angioplasty  Patent popliteal artery with 2-3 cm above-the-knee >75% stenosis tapered down to sub-total occlusion: resolved with orbital atherectomy with drug coated angioplasty  Widely patent below-the-knee popliteal artery with 3-vessel runoff to foot: posterior tibial artery and anterior tibial artery dominant runoff  Left leg with greatly improved blood flow: previously 2-3 mm superficial femoral artery now 4-5 mm with widely patent runoff, excellent blood flow to foot  SPECIMEN(S):  none  INDICATIONS:   Willie Morales is a 56 y.o. male who presents with prior angiogram demonstrated sub-total occlusions in the right femoropopliteal artery.  The patient presents for: right leg runoff, possible intervention right leg.  I discussed with the patient the nature of angiographic procedures, especially the limited patencies of any endovascular intervention.  The patient is aware of that the risks of an angiographic procedure include but are not limited to: bleeding, infection,  access site complications, renal failure, embolization, rupture of vessel, dissection, possible need for emergent surgical intervention, possible need for surgical procedures to treat the patient's pathology, and stroke and death.  The patient is aware of the risks and agrees to proceed.  DESCRIPTION: After full informed consent was obtained from the patient, the patient was brought back to the angiography suite.  The patient was placed supine upon the angiography table and connected to cardiopulmonary monitoring equipment.  The patient was then given conscious sedation, the amounts of which are documented in the patient's chart.  A circulating radiologic technician maintained continuous monitoring of the patient's cardiopulmonary status.  Additionally, the control room radiologic technician provided backup monitoring throughout the procedure.  The patient was prepped and drape in the standard fashion for an angiographic procedure.  At this point, attention was turned to the left groin.  Under ultrasound guidance, the subcutaneous tissue surrounding the left common femoral artery was anesthesized with 1% lidocaine with epinephrine.  The artery was then cannulated with a micropuncture needle.  The microwire was advanced into the iliac arterial system.  The needle was exchanged for a microsheath, which was loaded into the common femoral artery over the wire.  The microwire was exchanged for a Bentson wire which was advanced into the aorta.  The microsheath was then exchanged for a 5-Fr sheath which was loaded into the common femoral artery.  Using a Bentson wire and Omniflush catheter, the right common iliac artery was selected.  The wire was advanced into the common femoral artery.  The catheter would not track, so I exchanged it for a 4-Fr straight catheter, which I advanced into the distal external iliac artery.  The wire was removed and the catheter connected to the power  injector circuit.  An automated  right leg runoff was completed to identify the level of the two sub-total occlusions.  The wire was exchanged for a Rosen wire.  The patient's left femoral sheath was exchanged for a 6-Fr Destination sheath, which was lodged in the right common femoral artery.  The dilator was removed.  The patient was given 8500 units of Heparin intravenously, which was a therapeutic bolus.  In total, 9500 units of Heparin was administrated to achieve and maintain a therapeutic level of anticoagulation.  Using a Glidewire and Quickcross catheter, I was able to navigate past the two sub-total occlusions with some difficulty due to collaterals.  I eventually had to switch to a 0.014" Spartacore wire to get past the distal sub-total occlusion.  I exchanged the wire for a Viperwire.  I did a hand injection to confirm position of the two sub-total occlusions.    I loaded a 2.0 Classic Stealth Diamondback orbital atherectectomy device.  I used it at 60K, 90K, and 140 KRPM x 2 to perform orbital atherectomy of both lesions.  200 mcg of nitroglycerin was delivered intra-arterial via the device every 2 rotations of the device.  In total 800 mcg of nitroglycerin was delivered.    I then selected a 4 mm x 150 mm Admiral drug coated balloon and inflated it to treat both lesions at 8 atm for 3 minutes and 4 atm for 2 minutes.  The balloon was removed and completion angiogram demonstrated near total resolution of both lesions.  I did hand injection to verify no embolization distally with greatly improved tibial blood flow.  At this point, I pulled the wire and the left femoral sheath into the left external iliac artery.  The sheath was aspirated.  No clots were present and the sheath was reloaded with heparinized saline.  The left sheath was connected to the power injector circuit.  An automated left leg runoff was completed, as this patient continued to have significant pain in the left foot requiring narcotics.  I had concerns that  the prior intervention had occluded.  The runoff demonstrates patency of the entire arterial system in the left leg without any evidence of reocclusion.  The sheath was aspirated.  No clots were present and the sheath was reloaded with heparinized saline.     COMPLICATIONS: none  CONDITION: stable   Adele Barthel, MD, [facility] [place] Office: [phone] Pager: [phone]  07/19/2016, 9:17 AM

## 2016-07-19 NOTE — Interval H&P Note (Signed)
   History and Physical Update  The patient was interviewed and re-examined.  The patient's previous History and Physical has been reviewed and is unchanged from my consult except for: interval intervention on LLE.  The patient returns today for intervention on his R leg.  The plan is Right leg angiogram, possible intervention R leg with OA+DCB PTA.   Risk, benefits, and alternatives to access surgery were discussed.    The patient is aware the risks include but are not limited to: bleeding, infection, steal syndrome, nerve damage, ischemic monomelic neuropathy, thrombosis, failure to mature, need for additional procedures, death and stroke.    The patient agrees to proceed forward with the procedure.  Leonides SakeBrian Chen, MD, FACS [facility] [place] Office: [phone] Pager: [phone]  07/19/2016, 7:26 AM

## 2016-07-19 NOTE — Telephone Encounter (Signed)
-----   Message from Sharee PimpleMarilyn K McChesney, RN sent at 07/19/2016 10:22 AM EDT ----- Regarding: 4 weeks   ----- Message ----- From: Fransisco Hertzhen, Brian L, MD Sent: 07/19/2016   9:34 AM To: [facility] [address]  Lillia CarmelGeorge A Feimster [id number] 08/26/1960  PROCEDURE: 1.  Left common femoral artery cannulation under ultrasound guidance 2.  Placement of catheter in aorta 3.  Aortogram 4.  Second order arterial selection 5.  Right leg runoff 6.  Orbital atherectomy right femoropopliteal artery x 2  7.  Drug coated angioplasty right femoropopliteal artery x 2 (4 mm x 150 mm) 8.  Conscious sedation for 89 minutes 9.  Intra-arterial administration of nitroglycerin  Follow-up: 4 weeks

## 2016-07-19 NOTE — Discharge Instructions (Signed)
Angiogram, Care After °This sheet gives you information about how to care for yourself after your procedure. Your doctor may also give you more specific instructions. If you have problems or questions, contact your doctor. °Follow these instructions at home: °Insertion site care °· Follow instructions from your doctor about how to take care of your long, thin tube (catheter) insertion area. Make sure you: °? Wash your hands with soap and water before you change your bandage (dressing). If you cannot use soap and water, use hand sanitizer. °? Change your bandage as told by your doctor. °? Leave stitches (sutures), skin glue, or skin tape (adhesive) strips in place. They may need to stay in place for 2 weeks or longer. If tape strips get loose and curl up, you may trim the loose edges. Do not remove tape strips completely unless your doctor says it is okay. °· Do not take baths, swim, or use a hot tub until your doctor says it is okay. °· You may shower 24-48 hours after the procedure or as told by your doctor. °? Gently wash the area with plain soap and water. °? Pat the area dry with a clean towel. °? Do not rub the area. This may cause bleeding. °· Do not apply powder or lotion to the area. Keep the area clean and dry. °· Check your insertion area every day for signs of infection. Check for: °? More redness, swelling, or pain. °? Fluid or blood. °? Warmth. °? Pus or a bad smell. °Activity °· Rest as told by your doctor, usually for 1-2 days. °· Do not lift anything that is heavier than 10 lbs. (4.5 kg) or as told by your doctor. °· Do not drive for 24 hours if you were given a medicine to help you relax (sedative). °· Do not drive or use heavy machinery while taking prescription pain medicine. °General instructions °· Go back to your normal activities as told by your doctor, usually in about a week. Ask your doctor what activities are safe for you. °· If the insertion area starts to bleed, lie flat and put pressure  on the area. If the bleeding does not stop, get help right away. This is an emergency. °· Drink enough fluid to keep your pee (urine) clear or pale yellow. °· Take over-the-counter and prescription medicines only as told by your doctor. °· Keep all follow-up visits as told by your doctor. This is important. °Contact a doctor if: °· You have a fever. °· You have chills. °· You have more redness, swelling, or pain around your insertion area. °· You have fluid or blood coming from your insertion area. °· The insertion area feels warm to the touch. °· You have pus or a bad smell coming from your insertion area. °· You have more bruising around the insertion area. °· Blood collects in the tissue around the insertion area (hematoma) that may be painful to the touch. °Get help right away if: °· You have a lot of pain in the insertion area. °· The insertion area swells very fast. °· The insertion area is bleeding, and the bleeding does not stop after holding steady pressure on the area. °· The area near or just beyond the insertion area becomes pale, cool, tingly, or numb. °These symptoms may be an emergency. Do not wait to see if the symptoms will go away. Get medical help right away. Call your local emergency services (911 in the [place] Do not drive yourself to the hospital. °Summary °·   After the procedure, it is common to have bruising and tenderness at the long, thin tube insertion area.  After the procedure, it is important to rest and drink plenty of fluids.  Do not take baths, swim, or use a hot tub until your doctor says it is okay to do so. You may shower 24-48 hours after the procedure or as told by your doctor.  If the insertion area starts to bleed, lie flat and put pressure on the area. If the bleeding does not stop, get help right away. This is an emergency. This information is not intended to replace advice given to you by your health care provider. Make sure you discuss any questions you have with  your health care provider. Document Released: 05/04/2008 Document Revised: 01/31/2016 Document Reviewed: 01/31/2016   Femoral Site Care Refer to this sheet in the next few weeks. These instructions provide you with information about caring for yourself after your procedure. Your health care provider may also give you more specific instructions. Your treatment has been planned according to current medical practices, but problems sometimes occur. Call your health care provider if you have any problems or questions after your procedure. What can I expect after the procedure? After your procedure, it is typical to have the following:  Bruising at the site that usually fades within 1-2 weeks.  Blood collecting in the tissue (hematoma) that may be painful to the touch. It should usually decrease in size and tenderness within 1-2 weeks.  Follow these instructions at home:  Take medicines only as directed by your health care provider.  You may shower 24-48 hours after the procedure or as directed by your health care provider. Remove the bandage (dressing) and gently wash the site with plain soap and water. Pat the area dry with a clean towel. Do not rub the site, because this may cause bleeding.  Do not take baths, swim, or use a hot tub until your health care provider approves.  Check your insertion site every day for redness, swelling, or drainage.  Do not apply powder or lotion to the site.  Limit use of stairs to twice a day for the first 2-3 days or as directed by your health care provider.  Do not squat for the first 2-3 days or as directed by your health care provider.  Do not lift over 10 lb (4.5 kg) for 5 days after your procedure or as directed by your health care provider.  Ask your health care provider when it is okay to: ? Return to work or school. ? Resume usual physical activities or sports. ? Resume sexual activity.  Do not drive home if you are discharged the same day as  the procedure. Have someone else drive you.  You may drive 24 hours after the procedure unless otherwise instructed by your health care provider.  Do not operate machinery or power tools for 24 hours after the procedure or as directed by your health care provider.  If your procedure was done as an outpatient procedure, which means that you went home the same day as your procedure, a responsible adult should be with you for the first 24 hours after you arrive home.  Keep all follow-up visits as directed by your health care provider. This is important. Contact a health care provider if:  You have a fever.  You have chills.  You have increased bleeding from the site. Hold pressure on the site. Get help right away if:  You have  unusual pain at the site.  You have redness, warmth, or swelling at the site.  You have drainage (other than a small amount of blood on the dressing) from the site.  The site is bleeding, and the bleeding does not stop after 30 minutes of holding steady pressure on the site.  Your leg or foot becomes pale, cool, tingly, or numb. This information is not intended to replace advice given to you by your health care provider. Make sure you discuss any questions you have with your health care provider. Document Released: 10/09/2013 Document Revised: 07/14/2015 Document Reviewed: 08/25/2013 Elsevier Interactive Patient Education  [employer]2018 [employer]

## 2016-07-19 NOTE — Progress Notes (Signed)
16fr sheath removed from LFA. Manual pressure applied for 20 minutes. Groin level 0, tegaderm dressing applied.  No S+S of hematoma, bedrest instructions given.   Bilateral dp and pt pulses present with doppler.   Bedrest begins at 10:45:00

## 2016-07-20 ENCOUNTER — Encounter (HOSPITAL_COMMUNITY): Payer: Self-pay | Admitting: Vascular Surgery

## 2016-08-03 ENCOUNTER — Encounter: Payer: Self-pay | Admitting: Vascular Surgery

## 2016-08-14 NOTE — Progress Notes (Signed)
Postoperative Visit   History of Present Illness   Willie Morales is a 56 y.o. male who presents for postoperative follow-up from angio.  Procedures: 1.  OA+DCB PTA R fem-pop (07/19/16) 2.  L SFA SIA x 2 (07/05/16)  The patient's wounds (L MT blister and black eschar) are healed but residual callus is present at both locations.  The patient notes resolution of right lower extremity symptoms.  The patient is able to complete their activities of daily living.  The patient's current symptoms are: left leg neuropathic sx.  Past Medical History, Past Surgical History, Social History, Family History, Medications, Allergies, and Review of Systems are unchanged from previous evaluation on 07/19/16.  Current Outpatient Prescriptions  Medication Sig Dispense Refill  . apixaban (ELIQUIS) 5 MG TABS tablet Take 1 tablet (5 mg total) by mouth 2 (two) times daily. (Patient taking differently: Take 5 mg by mouth daily. ) 60 tablet 6  . benazepril (LOTENSIN) 10 MG tablet Take 1 tablet (10 mg total) by mouth daily. 30 tablet 6  . clopidogrel (PLAVIX) 75 MG tablet Take 1 tablet (75 mg total) by mouth daily. 30 tablet 11  . glimepiride (AMARYL) 4 MG tablet Take 1 tablet (4 mg total) by mouth daily with breakfast. 30 tablet 6  . HYDROcodone-acetaminophen (NORCO/VICODIN) 5-325 MG tablet Take one-two tabs po q 4-6 hrs prn pain (Patient taking differently: Take 1 tablet by mouth at bedtime as needed for moderate pain. ) 15 tablet 0  . metFORMIN (GLUCOPHAGE) 500 MG tablet Take 2 tablets (1,000 mg total) by mouth 3 (three) times daily. 90 tablet 6  . metoprolol tartrate (LOPRESSOR) 25 MG tablet Take 1 tablet (25 mg total) by mouth 2 (two) times daily. (Patient taking differently: Take 25 mg by mouth daily. ) 60 tablet 6  . oxyCODONE-acetaminophen (ROXICET) 5-325 MG tablet Take 1 tablet by mouth every 6 (six) hours as needed for moderate pain. 10 tablet 0   No current facility-administered medications for this  visit.     ROS: neuropathic sx L leg, residual callus in great toe and L 5th MT   For VQI Use Only   PRE-ADM LIVING: Home  AMB STATUS: Ambulatory   Physical Examination   Vitals:   08/17/16 0940  BP: 116/85  Pulse: 74  Resp: 20  Temp: 97.9 F (36.6 C)  TempSrc: Oral  SpO2: 99%  Weight: 192 lb 9.6 oz (87.4 kg)  Height: 6' (1.829 m)    Body mass index is 26.12 kg/m.  General Alert, O x 3, WD, NAD  Pulmonary Sym exp, good B air movt, CTA B  Cardiac RRR, Nl S1, S2, no Murmurs, No rubs, No S3,S4  Vascular Vessel Right Left  Radial Palpable Palpable  Brachial Palpable Palpable  Carotid Palpable, No Bruit Palpable, No Bruit  Aorta Not palpable N/A  Femoral Palpable Palpable  Popliteal Not palpable Not palpable  PT Palpable Palpable  DP Palpable Palpable    Gastrointestinal soft, non-distended, non-tender to palpation, No guarding or rebound, no HSM, no masses, no CVAT B, No palpable prominent aortic pulse,    Musculoskeletal M/S 5/5 throughout  , Extremities without ischemic changes  , No edema present, residual callus at tip of L great toe, callus at L 5th MT  Neurologic  Pain and light touch intact in extremities , Motor exam as listed above    Non-invasive Vascular Imaging   ABI (08/17/16)  R:   ABI: 1.21,   PT: tri  DP:  bi  TBI:  0.74  L:   ABI: 0.98,   PT: bi  DP: bi  TBI: 0.96   Medical Decision Making   Willie Morales is a 56 y.o. male who presents s/p R OA+DCB PTA, s/p L SFA SIA x 2, likely diabetic neuropathy  Will start pt on Neurontin 300 mg 1 PO TID for his likely L diabetic neuropathy Based on his angiographic findings, this patient needs: q3 month BLE ABI and BLE arterial duplex. Based on conversation, will get B carotid duplex also given hx suspicious for CVA I discussed in depth with the patient the nature of atherosclerosis, and emphasized the importance of maximal medical management including strict control of blood pressure,  blood glucose, and lipid levels, obtaining regular exercise, and cessation of smoking.  The patient is aware that without maximal medical management the underlying atherosclerotic disease process will progress, limiting the benefit of any interventions. The patient is currently not a statin.  I discussed starting one with the patient.  He would like to hold off given he is starting Neurontin also.  The patient is currently on an anti-platelet: Plavix.  Pt is going to switch over to ASA 81 mg, as he is also on Eliquis  Thank you for allowing us to participate in this patient's care.   Leonides SakeBrian Aletha Allebach, MD, FACS [facility] Office: [phone] Pager: [phone]

## 2016-08-17 ENCOUNTER — Encounter: Payer: Self-pay | Admitting: Vascular Surgery

## 2016-08-17 ENCOUNTER — Ambulatory Visit (INDEPENDENT_AMBULATORY_CARE_PROVIDER_SITE_OTHER): Payer: BLUE CROSS/BLUE SHIELD | Admitting: Vascular Surgery

## 2016-08-17 VITALS — BP 116/85 | HR 74 | Temp 97.9°F | Resp 20 | Ht 72.0 in | Wt 192.6 lb

## 2016-08-17 DIAGNOSIS — I7025 Atherosclerosis of native arteries of other extremities with ulceration: Secondary | ICD-10-CM

## 2016-08-17 DIAGNOSIS — Z8673 Personal history of transient ischemic attack (TIA), and cerebral infarction without residual deficits: Secondary | ICD-10-CM | POA: Diagnosis not present

## 2016-08-17 MED ORDER — GABAPENTIN 300 MG PO CAPS: 300.0000 mg | ORAL_CAPSULE | Freq: Three times a day (TID) | ORAL | 11 refills | Status: DC

## 2016-08-20 ENCOUNTER — Telehealth: Payer: Self-pay | Admitting: Vascular Surgery

## 2016-08-20 NOTE — Telephone Encounter (Signed)
LVM for pt to call back for his appt with [facility] tomorrow 7/3 at 8:45 for a 9 am appt their # [phone], [address]

## 2016-08-21 ENCOUNTER — Ambulatory Visit (INDEPENDENT_AMBULATORY_CARE_PROVIDER_SITE_OTHER): Payer: BLUE CROSS/BLUE SHIELD

## 2016-08-21 ENCOUNTER — Encounter: Payer: Self-pay | Admitting: Podiatry

## 2016-08-21 ENCOUNTER — Ambulatory Visit (INDEPENDENT_AMBULATORY_CARE_PROVIDER_SITE_OTHER): Payer: BLUE CROSS/BLUE SHIELD | Admitting: Podiatry

## 2016-08-21 VITALS — BP 136/94 | HR 77 | Resp 18

## 2016-08-21 DIAGNOSIS — R234 Changes in skin texture: Secondary | ICD-10-CM

## 2016-08-21 DIAGNOSIS — M79672 Pain in left foot: Secondary | ICD-10-CM

## 2016-08-21 DIAGNOSIS — E0842 Diabetes mellitus due to underlying condition with diabetic polyneuropathy: Secondary | ICD-10-CM

## 2016-08-21 NOTE — Patient Instructions (Addendum)
Today your exam demonstrated adequate circulation You have decreased feeling in your left foot as compared your right You have muscular weakness in the left lower leg Five-month history of scab on the border of the left foot Pending neurology appointment 3 months Apply Vaseline to the scab on the left foot You notice any sudden increase in pain, swelling, redness, fever present to the emergency department Reappoint 2  weeks  Diabetes and Foot Care Diabetes may cause you to have problems because of poor blood supply (circulation) to your feet and legs. This may cause the skin on your feet to become thinner, break easier, and heal more slowly. Your skin may become dry, and the skin may peel and crack. You may also have nerve damage in your legs and feet causing decreased feeling in them. You may not notice minor injuries to your feet that could lead to infections or more serious problems. Taking care of your feet is one of the most important things you can do for yourself. Follow these instructions at home:  Wear shoes at all times, even in the house. Do not go barefoot. Bare feet are easily injured.  Check your feet daily for blisters, cuts, and redness. If you cannot see the bottom of your feet, use a mirror or ask someone for help.  Wash your feet with warm water (do not use hot water) and mild soap. Then pat your feet and the areas between your toes until they are completely dry. Do not soak your feet as this can dry your skin.  Apply a moisturizing lotion or petroleum jelly (that does not contain alcohol and is unscented) to the skin on your feet and to dry, brittle toenails. Do not apply lotion between your toes.  Trim your toenails straight across. Do not dig under them or around the cuticle. File the edges of your nails with an emery board or nail file.  Do not cut corns or calluses or try to remove them with medicine.  Wear clean socks or stockings every day. Make sure they are not  too tight. Do not wear knee-high stockings since they may decrease blood flow to your legs.  Wear shoes that fit properly and have enough cushioning. To break in new shoes, wear them for just a few hours a day. This prevents you from injuring your feet. Always look in your shoes before you put them on to be sure there are no objects inside.  Do not cross your legs. This may decrease the blood flow to your feet.  If you find a minor scrape, cut, or break in the skin on your feet, keep it and the skin around it clean and dry. These areas may be cleansed with mild soap and water. Do not cleanse the area with peroxide, alcohol, or iodine.  When you remove an adhesive bandage, be sure not to damage the skin around it.  If you have a wound, look at it several times a day to make sure it is healing.  Do not use heating pads or hot water bottles. They may burn your skin. If you have lost feeling in your feet or legs, you may not know it is happening until it is too late.  Make sure your health care provider performs a complete foot exam at least annually or more often if you have foot problems. Report any cuts, sores, or bruises to your health care provider immediately. Contact a health care provider if:  You have an injury  that is not healing.  You have cuts or breaks in the skin.  You have an ingrown nail.  You notice redness on your legs or feet.  You feel burning or tingling in your legs or feet.  You have pain or cramps in your legs and feet.  Your legs or feet are numb.  Your feet always feel cold. Get help right away if:  There is increasing redness, swelling, or pain in or around a wound.  There is a red line that goes up your leg.  Pus is coming from a wound.  You develop a fever or as directed by your health care provider.  You notice a bad smell coming from an ulcer or wound. This information is not intended to replace advice given to you by your health care provider.  Make sure you discuss any questions you have with your health care provider. Document Released: 02/03/2000 Document Revised: 07/14/2015 Document Reviewed: 07/15/2012 Elsevier Interactive Patient Education  2017 [employer]

## 2016-08-21 NOTE — Progress Notes (Signed)
Subjective:    Patient ID: Willie Morales, male    DOB: 10-11-1960, 56 y.o.   MRN: [id number]  HPI This patient presents today for evaluation of an eschar on the fifth MPJ present for 5 months. Patient states that he has some discomfort in area with direct shoe pressure. He denies any active drainage, erythema warmth around this area. Patient has a history of other eschars on right and left feet which have sloughed and improved with peripheral vascular stenting bilaterally. Patient's last visit with vascular surgeon was on 08/17/2016. Patient states that vascular surgeon suggested podiatry referral for the eschar on the left foot. 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   Cardiovascular: Positive for leg swelling.       Calf pain with walking   Musculoskeletal: Positive for gait problem.  Psychiatric/Behavioral: Positive for confusion.  All other systems reviewed and are negative.      Objective:   Physical Exam  Orientated 3 Mild nonpitting edema left foot DP and PT pulses 2/4 bilaterally Capillary reflex immediate bilaterally  Neurological: Sensation to 10 g monofilament wire intact 7/9 right and 4/9 left Vibratory sensation reactive right nonreactive left Ankle reflex reactive bilaterally  Dermatological: No open skin lesions bilaterally Absent hair growth bilaterally Eschar fifth MPJ left without any severe surrounding erythema, edema, warmth,  drainage  Musculoskeletal: Dorsi flexion right 5/5 and 3/5 left Plantar flexion 5/5 right and 4/5 left Inversion right 5/5 right and 0/4 left Eversion 5/5 right and 0/4 left  X-ray examination weightbearing left foot dated 08/21/2016  Intact bony structures without fracture and/or dislocation No evidence of cortical disruption in or around the fifth MPJ left Increased soft tissue density localized about eschar fifth MPJ left No gas formation noted  Radiographic impression: No x-ray evidence of osteomyelitis left foot No acute bony abnormality left   Chart review: History of Present Illness   Willie Morales is a 56 y.o. male who presents for postoperative follow-up from angio.  Procedures: 1.  OA+DCB PTA R fem-pop (07/19/16) 2.  L SFA SIA x 2 (07/05/16)  The patient's wounds (L MT blister and black eschar) are healed but residual callus is present at both locations.  The patient notes resolution of right lower extremity symptoms.  The patient is able to complete their activities of daily living.  The patient's current symptoms are: left leg neuropathic sx.  Past Medical History, Past Surgical History, Social History, Family History, Medications, Allergies, and Review of Systems are unchanged from previous evaluation on 07/19/16.        Current Outpatient Prescriptions  Medication Sig Dispense Refill  . apixaban (ELIQUIS) 5 MG TABS tablet Take 1 tablet (5 mg total) by mouth 2 (two) times daily. (Patient taking differently: Take 5 mg by mouth daily. ) 60 tablet 6  . benazepril (LOTENSIN) 10 MG tablet Take 1 tablet (10 mg total) by mouth daily. 30 tablet 6  . clopidogrel (PLAVIX) 75 MG tablet Take 1 tablet (75 mg total)  by mouth daily. 30 tablet 11  . glimepiride (AMARYL) 4 MG tablet Take 1 tablet (4 mg total) by mouth daily with breakfast. 30 tablet 6  . HYDROcodone-acetaminophen (NORCO/VICODIN) 5-325 MG tablet Take one-two tabs po q 4-6 hrs prn pain (Patient taking  differently: Take 1 tablet by mouth at bedtime as needed for moderate pain. ) 15 tablet 0  . metFORMIN (GLUCOPHAGE) 500 MG tablet Take 2 tablets (1,000 mg total) by mouth 3 (three) times daily. 90 tablet 6  . metoprolol tartrate (LOPRESSOR) 25 MG tablet Take 1 tablet (25 mg total) by mouth 2 (two) times daily. (Patient taking differently: Take 25 mg by mouth daily. ) 60 tablet 6  . oxyCODONE-acetaminophen (ROXICET) 5-325 MG tablet Take 1 tablet by mouth every 6 (six) hours as needed for moderate pain. 10 tablet 0   No current facility-administered medications for this visit.     ROS: neuropathic sx L leg, residual callus in great toe and L 5th MT   For VQI Use Only   PRE-ADM LIVING: Home  AMB STATUS: Ambulatory   Physical Examination      Vitals:   08/17/16 0940  BP: 116/85  Pulse: 74  Resp: 20  Temp: 97.9 F (36.6 C)  TempSrc: Oral  SpO2: 99%  Weight: 192 lb 9.6 oz (87.4 kg)  Height: 6' (1.829 m)    Body mass index is 26.12 kg/m.  General Alert, O x 3, WD, NAD  Pulmonary Sym exp, good B air movt, CTA B  Cardiac RRR, Nl S1, S2, no Murmurs, No rubs, No S3,S4  Vascular Vessel Right Left  Radial Palpable Palpable  Brachial Palpable Palpable  Carotid Palpable, No Bruit Palpable, No Bruit  Aorta Not palpable N/A  Femoral Palpable Palpable  Popliteal Not palpable Not palpable  PT Palpable Palpable  DP Palpable Palpable    Gastrointestinal soft, non-distended, non-tender to palpation, No guarding or rebound, no HSM, no masses, no CVAT B, No palpable prominent aortic pulse,    Musculoskeletal M/S 5/5 throughout  , Extremities without ischemic changes  , No edema present, residual callus at tip of L great toe, callus at L 5th MT  Neurologic  Pain and light touch intact in extremities , Motor exam as listed above    Non-invasive Vascular Imaging   ABI (08/17/16)  R:  ? ABI: 1.21,  ? PT: tri ? DP: bi ? TBI:  0.74  L:  ? ABI: 0.98,  ? PT: bi ? DP:  bi ? TBI: 0.96   Medical Decision Making   [name] is a 56 y.o. male who presents s/p R OA+DCB PTA, s/p L SFA SIA x 2, likely diabetic neuropathy   Will start pt on Neurontin 300 mg 1 PO TID for his likely L diabetic neuropathy  Based on his angiographic findings, this patient needs: q3 month BLE ABI and BLE arterial duplex.  Based on conversation, will get B carotid duplex also given hx suspicious for CVA  I discussed in depth with the patient the nature of atherosclerosis, and emphasized the importance of maximal medical management including strict control of blood pressure, blood glucose, and lipid levels, obtaining regular exercise, and cessation of smoking.  The patient is aware that without maximal medical management the underlying atherosclerotic disease process will progress, limiting the benefit of any interventions.  The patient is currently not a statin.  I discussed starting one with the patient.  He would like to hold off given he is starting Neurontin  also.   The patient is currently on an anti-platelet: Plavix.  Pt is going to switch over to ASA 81 mg, as he is also on Eliquis  Thank you for allowing [place] to participate in this patient's care.   Leonides Sake, MD, FACS [facility] [place] Office: [phone] Pager: [phone]        Assessment & Plan:    Assessment: Diabetic with satisfactory vascular status per note of 08/17/2016 and palpation of pedal pulses today Eschar fifth MPJ area left without obvious sign of clinical infection Sensory and motor neuropathy with pending neurology evaluation  Plan: I reviewed the results of the exam with patient and patient's wife today. At this time the eschar appears to be stable without obvious sign of clinical infection or underlying osteomyelitis. Am recommending application of Vaseline and a protective bandage were Band-Aid over the eschar on the left foot. Also, patient has motor  weakness left and concerned about potential falls if placed in a surgical shoe. Wear soft shoe over the area Keep pending appointment with neurologist Instructed patient that if he notice any sudden increase in pain, swelling, redness, fever to present to the emergency department Reevaluate patient 2 weeks

## 2016-08-27 ENCOUNTER — Other Ambulatory Visit (HOSPITAL_COMMUNITY): Payer: Self-pay | Admitting: Internal Medicine

## 2016-08-27 DIAGNOSIS — R4182 Altered mental status, unspecified: Secondary | ICD-10-CM

## 2016-08-28 NOTE — Addendum Note (Signed)
Addended by: Burton ApleyPETTY, Aleanna Menge A on: 08/28/2016 11:10 AM   Modules accepted: Orders

## 2016-08-30 ENCOUNTER — Ambulatory Visit (HOSPITAL_COMMUNITY)
Admission: RE | Admit: 2016-08-30 | Discharge: 2016-08-30 | Disposition: A | Discharge status: Home / Self Care | Payer: BLUE CROSS/BLUE SHIELD | Source: Ambulatory Visit | Attending: Internal Medicine | Admitting: Internal Medicine

## 2016-08-30 DIAGNOSIS — I639 Cerebral infarction, unspecified: Secondary | ICD-10-CM | POA: Insufficient documentation

## 2016-08-30 DIAGNOSIS — I6782 Cerebral ischemia: Secondary | ICD-10-CM | POA: Diagnosis not present

## 2016-08-30 DIAGNOSIS — R4182 Altered mental status, unspecified: Secondary | ICD-10-CM | POA: Diagnosis not present

## 2016-09-05 ENCOUNTER — Ambulatory Visit (INDEPENDENT_AMBULATORY_CARE_PROVIDER_SITE_OTHER): Payer: BLUE CROSS/BLUE SHIELD | Admitting: Podiatry

## 2016-09-05 ENCOUNTER — Encounter: Payer: Self-pay | Admitting: Podiatry

## 2016-09-05 DIAGNOSIS — I739 Peripheral vascular disease, unspecified: Secondary | ICD-10-CM

## 2016-09-05 DIAGNOSIS — R234 Changes in skin texture: Secondary | ICD-10-CM

## 2016-09-05 NOTE — Patient Instructions (Addendum)
Apply Vaseline and a protective foam pad around the scab area on the side of your daily Observe that area to make sure the wound stays closed and return if it opens  Contact  your primary physician about adjusting dose of gabapentin  Diabetes and Foot Care Diabetes may cause you to have problems because of poor blood supply (circulation) to your feet and legs. This may cause the skin on your feet to become thinner, break easier, and heal more slowly. Your skin may become dry, and the skin may peel and crack. You may also have nerve damage in your legs and feet causing decreased feeling in them. You may not notice minor injuries to your feet that could lead to infections or more serious problems. Taking care of your feet is one of the most important things you can do for yourself. Follow these instructions at home:  Wear shoes at all times, even in the house. Do not go barefoot. Bare feet are easily injured.  Check your feet daily for blisters, cuts, and redness. If you cannot see the bottom of your feet, use a mirror or ask someone for help.  Wash your feet with warm water (do not use hot water) and mild soap. Then pat your feet and the areas between your toes until they are completely dry. Do not soak your feet as this can dry your skin.  Apply a moisturizing lotion or petroleum jelly (that does not contain alcohol and is unscented) to the skin on your feet and to dry, brittle toenails. Do not apply lotion between your toes.  Trim your toenails straight across. Do not dig under them or around the cuticle. File the edges of your nails with an emery board or nail file.  Do not cut corns or calluses or try to remove them with medicine.  Wear clean socks or stockings every day. Make sure they are not too tight. Do not wear knee-high stockings since they may decrease blood flow to your legs.  Wear shoes that fit properly and have enough cushioning. To break in new shoes, wear them for just a few  hours a day. This prevents you from injuring your feet. Always look in your shoes before you put them on to be sure there are no objects inside.  Do not cross your legs. This may decrease the blood flow to your feet.  If you find a minor scrape, cut, or break in the skin on your feet, keep it and the skin around it clean and dry. These areas may be cleansed with mild soap and water. Do not cleanse the area with peroxide, alcohol, or iodine.  When you remove an adhesive bandage, be sure not to damage the skin around it.  If you have a wound, look at it several times a day to make sure it is healing.  Do not use heating pads or hot water bottles. They may burn your skin. If you have lost feeling in your feet or legs, you may not know it is happening until it is too late.  Make sure your health care provider performs a complete foot exam at least annually or more often if you have foot problems. Report any cuts, sores, or bruises to your health care provider immediately. Contact a health care provider if:  You have an injury that is not healing.  You have cuts or breaks in the skin.  You have an ingrown nail.  You notice redness on your legs or feet.  You feel burning or tingling in your legs or feet.  You have pain or cramps in your legs and feet.  Your legs or feet are numb.  Your feet always feel cold. Get help right away if:  There is increasing redness, swelling, or pain in or around a wound.  There is a red line that goes up your leg.  Pus is coming from a wound.  You develop a fever or as directed by your health care provider.  You notice a bad smell coming from an ulcer or wound. This information is not intended to replace advice given to you by your health care provider. Make sure you discuss any questions you have with your health care provider. Document Released: 02/03/2000 Document Revised: 07/14/2015 Document Reviewed: 07/15/2012 Elsevier Interactive Patient  Education  2017 [employer]

## 2016-09-06 NOTE — Progress Notes (Signed)
Patient ID: Willie Morales, male   DOB: 08/05/1960, 56 y.o.   MRN: [id number]   Subjective: This patient presents for follow-up visit of 08/21/2016 for evaluation of a eschar on the lateral aspect left foot. At that time there was no obvious clinical sign of infection or radiographic evidence of osteomyelitis in the left foot. At this time patient has noticed no change in the area. Patient continues to complain of ongoing numbness and painful sensation in his feet   HPI This patient presents today for evaluation of an eschar on the fifth MPJ present for 5 months. Patient states that he has some discomfort in area with direct shoe pressure. He denies any active drainage, erythema warmth around this area. Patient has a history of other eschars on right and left feet which have sloughed and improved with peripheral vascular stenting bilaterally. Patient's last visit with vascular surgeon was on 08/17/2016. Patient states that vascular surgeon suggested podiatry referral for the eschar on the left foot. 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   Cardiovascular: Positive for leg swelling.       Calf pain with walking   Musculoskeletal: Positive for gait problem.  Psychiatric/Behavioral: Positive for confusion.  All other systems reviewed and are negative  Objective: Orientated 3 Mild nonpitting edema left foot DP and PT pulses 2/4 bilaterally Capillary reflex immediate  bilaterally  Neurological: Sensation to 10 g monofilament wire intact 7/9 right and 4/9 left Vibratory sensation reactive right nonreactive left Ankle reflex reactive bilaterally  Dermatological: No open skin lesions bilaterally Absent hair growth bilaterally Eschar fifth MPJ left without any severe surrounding erythema, edema, warmth, drainage The eschar was debrided and the underlying wound base remains closed with fragile closure.  Musculoskeletal: Dorsi flexion right 5/5 and 3/5 left Plantar flexion 5/5 right and 4/5 left Inversion right 5/5 right and 0/4 left Eversion 5/5 right and 0/4 left  Assessment: Diabetic with improved vascular status, history of peripheral arterial disease Healing pre-ulcerative eschar/keratoses left fifth MPJ Peripheral neuropathy with pending neurological evaluation  Plan: Debride eschar without any bleeding Apply protective foam pad around area Instructed patient to apply Vaseline and protective foam pad around the eschar on the fifth MPJ area and wear soft shoe. Instructed patient to observe the area for any sign of increased pain, swelling, redness, fever. If notice present to the emergency department Instructed patient to contact primary care physician discuss options of adjusting gabapentin dosage  Reappoint at patient's request

## 2016-09-12 ENCOUNTER — Other Ambulatory Visit (HOSPITAL_COMMUNITY): Payer: Self-pay | Admitting: Internal Medicine

## 2016-09-12 DIAGNOSIS — R229 Localized swelling, mass and lump, unspecified: Secondary | ICD-10-CM

## 2016-09-18 ENCOUNTER — Ambulatory Visit (HOSPITAL_COMMUNITY): Payer: BLUE CROSS/BLUE SHIELD

## 2016-09-18 ENCOUNTER — Ambulatory Visit (HOSPITAL_COMMUNITY)
Admission: RE | Admit: 2016-09-18 | Discharge: 2016-09-18 | Disposition: A | Discharge status: Home / Self Care | Payer: BLUE CROSS/BLUE SHIELD | Source: Ambulatory Visit | Attending: Internal Medicine | Admitting: Internal Medicine

## 2016-09-18 DIAGNOSIS — R229 Localized swelling, mass and lump, unspecified: Secondary | ICD-10-CM

## 2016-09-18 DIAGNOSIS — N6320 Unspecified lump in the left breast, unspecified quadrant: Secondary | ICD-10-CM | POA: Diagnosis not present

## 2016-11-13 DIAGNOSIS — Z0279 Encounter for issue of other medical certificate: Secondary | ICD-10-CM | POA: Diagnosis not present

## 2016-11-13 NOTE — Progress Notes (Signed)
Established Critical Limb Ischemia Patient   History of Present Illness   Willie Morales is a 56 y.o. (May 16, 1960) male who presents with chief complaint: cold hands and feet.    Procedures: 1.  OA+DCB PTA R fem-pop (07/19/16) for sub-total occlusion of R SFA and pop 2.  L SFA SIA x 2 (07/05/16) for L SFA occlusion  The patient has no rest pain and wounds include: L foot callus.  The patient notes symptoms have not progressed.  The patient's treatment regimen currently included: maximal medical management.  Pt continues to smoke.  The patient's PMH, PSH, SH, and FamHx are unchanged from 08/17/16.  Current Outpatient Prescriptions  Medication Sig Dispense Refill  . apixaban (ELIQUIS) 5 MG TABS tablet Take 1 tablet (5 mg total) by mouth 2 (two) times daily. (Patient taking differently: Take 5 mg by mouth daily. ) 60 tablet 6  . benazepril (LOTENSIN) 10 MG tablet Take 1 tablet (10 mg total) by mouth daily. 30 tablet 6  . clopidogrel (PLAVIX) 75 MG tablet Take 1 tablet (75 mg total) by mouth daily. 30 tablet 11  . gabapentin (NEURONTIN) 300 MG capsule Take 1 capsule (300 mg total) by mouth 3 (three) times daily. 90 capsule 11  . glimepiride (AMARYL) 4 MG tablet Take 1 tablet (4 mg total) by mouth daily with breakfast. 30 tablet 6  . HYDROcodone-acetaminophen (NORCO/VICODIN) 5-325 MG tablet Take one-two tabs po q 4-6 hrs prn pain (Patient not taking: Reported on 08/17/2016) 15 tablet 0  . metFORMIN (GLUCOPHAGE) 500 MG tablet Take 2 tablets (1,000 mg total) by mouth 3 (three) times daily. 90 tablet 6  . metoprolol tartrate (LOPRESSOR) 25 MG tablet Take 1 tablet (25 mg total) by mouth 2 (two) times daily. (Patient taking differently: Take 25 mg by mouth daily. ) 60 tablet 6  . oxyCODONE-acetaminophen (ROXICET) 5-325 MG tablet Take 1 tablet by mouth every 6 (six) hours as needed for moderate pain. (Patient not taking: Reported on 08/17/2016) 10 tablet 0   No current facility-administered  medications for this visit.     On ROS today: no rest pain, no ulcers   Physical Examination   Vitals:   11/16/16 1431 11/16/16 1438  BP: (!) 148/100 138/88  Pulse: 87   SpO2: 99%   Weight: 186 lb 9.6 oz (84.6 kg)   Height: 6' (1.829 m)    Body mass index is 25.31 kg/m.  General Alert, O x 3, WD, NAD  Pulmonary Sym exp, good B air movt, CTA B  Cardiac RRR, Nl S1, S2, no Murmurs, No rubs, No S3,S4  Vascular Vessel Right Left  Radial Palpable Palpable  Brachial Palpable Palpable  Carotid Palpable, No Bruit Palpable, No Bruit  Aorta Not palpable N/A  Femoral Palpable Palpable  Popliteal Not palpable Not palpable  PT Not palpable Not palpable  DP Not palpable Not palpable    Gastro- intestinal soft, non-distended, non-tender to palpation, No guarding or rebound, no HSM, no masses, no CVAT B, No palpable prominent aortic pulse,    Musculo- skeletal M/S 5/5 throughout  , Extremities without ischemic changes  , No edema present, No visible varicosities , No Lipodermatosclerosis present, L calf somewhat cynaotic  Neurologic Cranial nerves 2-12 intact , Pain and light touch intact in extremities , Motor exam as listed above    Non-Invasive Vascular Imaging   ABI (11/16/2016)  R:   ABI: 0.72 (1.21),   PT: mono  DP: mono  TBI:  0.72  L:  ABI: 0.57 (0.98),   PT: mono  DP: mono  TBI: 0.57  BLE Arterial duplex (11/16/2016)  R: biphasic throughout  L:   CFA and PFA: triphasic  SFA: 39-509, Vr 4.6  Rest of arterial system: Monophasic  B Carotid Duplex (11/16/2016):   R ICA stenosis:  60-79%  R VA: patent and antegrade  L ICA stenosis:  60-79%  L VA: patent and antegrade    Medical Decision Making   Willie Morales is a 56 y.o. male who presents with:s/p R OA+DCB PTA for sub-total occlusion of distal R SFA and AK pop, s/p L SFA SIA x 2 for L foot ulcers, likely diabetic neuropathy   Based on the patient's vascular studies and examination, I have  offered the patient: LRo, likely reintervention on L SFA.  Given relatively rapid recurrence, will likely need a stent placed.. I discussed with the patient the nature of angiographic procedures, especially the limited patencies of any endovascular intervention.   The patient is aware of that the risks of an angiographic procedure include but are not limited to: bleeding, infection, access site complications, renal failure, embolization, rupture of vessel, dissection, arteriovenous fistula, possible need for emergent surgical intervention, possible need for surgical procedures to treat the patient's pathology, anaphylactic reaction to contrast, and stroke and death.   The patient is aware of the risks and agrees to proceed on 18 OCT 18.  His brother recently died, so he needs to delay any procedures until those issues are dealt with.  I discussed in depth with the patient the nature of atherosclerosis, and emphasized the importance of maximal medical management including strict control of blood pressure, blood glucose, and lipid levels, antiplatelet agents, obtaining regular exercise, and cessation of smoking.    The patient is aware that without maximal medical management the underlying atherosclerotic disease process will progress, limiting the benefit of any interventions. The patient is currently not on statin per pt's preference.  I had recommend he start one previously. The patient is currently on an anti-platelet: Plavix.  I reiterated he needs to stop smoking.   Thank you for allowing [place] to participate in this patient's care.   Leonides Sake, MD, FACS [facility] [place] Office: [phone] Pager: [phone]

## 2016-11-16 ENCOUNTER — Encounter: Payer: Self-pay | Admitting: Vascular Surgery

## 2016-11-16 ENCOUNTER — Ambulatory Visit (HOSPITAL_COMMUNITY)
Admission: RE | Admit: 2016-11-16 | Discharge: 2016-11-16 | Disposition: A | Discharge status: Home / Self Care | Payer: BLUE CROSS/BLUE SHIELD | Source: Ambulatory Visit | Attending: Vascular Surgery | Admitting: Vascular Surgery

## 2016-11-16 ENCOUNTER — Ambulatory Visit (INDEPENDENT_AMBULATORY_CARE_PROVIDER_SITE_OTHER): Payer: Self-pay | Admitting: Vascular Surgery

## 2016-11-16 VITALS — BP 138/88 | HR 87 | Ht 72.0 in | Wt 186.6 lb

## 2016-11-16 DIAGNOSIS — I7025 Atherosclerosis of native arteries of other extremities with ulceration: Secondary | ICD-10-CM

## 2016-11-16 DIAGNOSIS — Z8673 Personal history of transient ischemic attack (TIA), and cerebral infarction without residual deficits: Secondary | ICD-10-CM | POA: Insufficient documentation

## 2016-11-16 LAB — VAS US LOWER EXTREMITY ARTERIAL DUPLEX
Left ant tibial distal sys: 15 cm/s
Left popliteal dist sys PSV: -49 cm/s
Left popliteal prox sys PSV: 40 cm/s
Left super femoral dist sys PSV: -39 cm/s
Left super femoral mid sys PSV: -107 cm/s
Left super femoral prox sys PSV: -112 cm/s
RIGHT ANT DIST TIBAL SYS PSV: 23 cm/s
RIGHT POST TIB DIST SYS: 34 cm/s
Right peroneal sys PSV: 23 cm/s
Right popliteal dist sys PSV: -95 cm/s
Right popliteal prox sys PSV: 162 cm/s
Right super femoral dist sys PSV: -150 cm/s
Right super femoral mid sys PSV: -128 cm/s
Right super femoral prox sys PSV: -116 cm/s
left post tibial dist sys: 16 cm/s

## 2016-11-16 LAB — VAS US CAROTID
LEFT ECA DIAS: -59 cm/s
Left CCA dist dias: -5 cm/s
Left CCA dist sys: -24 cm/s
Left CCA prox dias: 6 cm/s
Left CCA prox sys: 78 cm/s
Left ICA prox dias: 0 cm/s
Left ICA prox sys: 0 cm/s
RIGHT CCA MID DIAS: 37 cm/s
RIGHT ECA DIAS: -239 cm/s
RIGHT VERTEBRAL DIAS: 20 cm/s
Right CCA prox dias: 44 cm/s
Right CCA prox sys: 206 cm/s
Right cca dist sys: -140 cm/s

## 2016-11-19 ENCOUNTER — Telehealth: Payer: Self-pay | Admitting: *Deleted

## 2016-11-19 ENCOUNTER — Other Ambulatory Visit: Payer: Self-pay | Admitting: *Deleted

## 2016-11-19 NOTE — Progress Notes (Signed)
Called and spoke to patient's wife gave her pre vascular lab instructions to include: NPO past midnight except may take am heart and blood pressure medications with sip of water morning of procedure. Hold Eloquis after 10/14 dose, Hold 10/17 pm dose of Metformin and  all diabetic medications am of procedure. Must have ride home  and someone with him after procedure. Verbalized understanding .

## 2016-12-06 ENCOUNTER — Encounter (HOSPITAL_COMMUNITY): Payer: Self-pay | Admitting: Vascular Surgery

## 2016-12-06 ENCOUNTER — Ambulatory Visit (HOSPITAL_COMMUNITY)
Admission: RE | Admit: 2016-12-06 | Discharge: 2016-12-06 | Disposition: A | Discharge status: Home / Self Care | Payer: BLUE CROSS/BLUE SHIELD | Source: Ambulatory Visit | Attending: Vascular Surgery | Admitting: Vascular Surgery

## 2016-12-06 ENCOUNTER — Telehealth: Payer: Self-pay | Admitting: Vascular Surgery

## 2016-12-06 ENCOUNTER — Ambulatory Visit (HOSPITAL_COMMUNITY)
Admission: RE | Disposition: A | Discharge status: Home / Self Care | Payer: Self-pay | Source: Ambulatory Visit | Attending: Vascular Surgery

## 2016-12-06 DIAGNOSIS — L97529 Non-pressure chronic ulcer of other part of left foot with unspecified severity: Secondary | ICD-10-CM | POA: Insufficient documentation

## 2016-12-06 DIAGNOSIS — L84 Corns and callosities: Secondary | ICD-10-CM | POA: Diagnosis not present

## 2016-12-06 DIAGNOSIS — F17210 Nicotine dependence, cigarettes, uncomplicated: Secondary | ICD-10-CM | POA: Diagnosis not present

## 2016-12-06 DIAGNOSIS — I70245 Atherosclerosis of native arteries of left leg with ulceration of other part of foot: Secondary | ICD-10-CM | POA: Insufficient documentation

## 2016-12-06 DIAGNOSIS — I70229 Atherosclerosis of native arteries of extremities with rest pain, unspecified extremity: Secondary | ICD-10-CM | POA: Diagnosis present

## 2016-12-06 DIAGNOSIS — I7025 Atherosclerosis of native arteries of other extremities with ulceration: Secondary | ICD-10-CM | POA: Diagnosis not present

## 2016-12-06 HISTORY — PX: PERIPHERAL VASCULAR INTERVENTION: CATH118257

## 2016-12-06 HISTORY — PX: LOWER EXTREMITY ANGIOGRAPHY: CATH118251

## 2016-12-06 LAB — POCT I-STAT, CHEM 8
BUN: 17 mg/dL (ref 6–20)
Calcium, Ion: 1.08 mmol/L — ABNORMAL LOW (ref 1.15–1.40)
Chloride: 107 mmol/L (ref 101–111)
Creatinine, Ser: 0.8 mg/dL (ref 0.61–1.24)
Glucose, Bld: 236 mg/dL — ABNORMAL HIGH (ref 65–99)
HCT: 43 % (ref 39.0–52.0)
Hemoglobin: 14.6 g/dL (ref 13.0–17.0)
Potassium: 4.4 mmol/L (ref 3.5–5.1)
Sodium: 137 mmol/L (ref 135–145)
TCO2: 20 mmol/L — ABNORMAL LOW (ref 22–32)

## 2016-12-06 LAB — POCT ACTIVATED CLOTTING TIME
Activated Clotting Time: 263 seconds
Activated Clotting Time: 208 seconds
Activated Clotting Time: 164 seconds

## 2016-12-06 LAB — GLUCOSE, CAPILLARY: Glucose-Capillary: 209 mg/dL — ABNORMAL HIGH (ref 65–99)

## 2016-12-06 SURGERY — LOWER EXTREMITY ANGIOGRAPHY
Anesthesia: LOCAL

## 2016-12-06 MED ORDER — HEPARIN (PORCINE) IN NACL 2-0.9 UNIT/ML-% IJ SOLN
INTRAMUSCULAR | Status: AC
  Filled 2016-12-06: qty 1000

## 2016-12-06 MED ORDER — HEPARIN SODIUM (PORCINE) 1000 UNIT/ML IJ SOLN
INTRAMUSCULAR | Status: AC
  Filled 2016-12-06: qty 1

## 2016-12-06 MED ORDER — IODIXANOL 320 MG/ML IV SOLN
INTRAVENOUS | Status: DC | PRN
  Administered 2016-12-06: 100 mL via INTRA_ARTERIAL

## 2016-12-06 MED ORDER — OXYCODONE HCL 5 MG PO TABS: 5.0000 mg | ORAL_TABLET | ORAL | Status: DC | PRN

## 2016-12-06 MED ORDER — SODIUM CHLORIDE 0.9 % IV SOLN: 250.0000 mL | INTRAVENOUS | Status: DC | PRN

## 2016-12-06 MED ORDER — SODIUM CHLORIDE 0.9 % WEIGHT BASED INFUSION: 1.0000 mL/kg/h | INTRAVENOUS | Status: DC

## 2016-12-06 MED ORDER — MIDAZOLAM HCL 2 MG/2ML IJ SOLN
INTRAMUSCULAR | Status: AC
  Filled 2016-12-06: qty 2

## 2016-12-06 MED ORDER — SODIUM CHLORIDE 0.9% FLUSH: 3.0000 mL | INTRAVENOUS | Status: DC | PRN

## 2016-12-06 MED ORDER — MORPHINE SULFATE (PF) 10 MG/ML IV SOLN: 2.0000 mg | INTRAVENOUS | Status: DC | PRN

## 2016-12-06 MED ORDER — MIDAZOLAM HCL 2 MG/2ML IJ SOLN
INTRAMUSCULAR | Status: DC | PRN
  Administered 2016-12-06: 1 mg via INTRAVENOUS

## 2016-12-06 MED ORDER — LABETALOL HCL 5 MG/ML IV SOLN
INTRAVENOUS | Status: AC
  Filled 2016-12-06: qty 4

## 2016-12-06 MED ORDER — SODIUM CHLORIDE 0.9 % IV SOLN
INTRAVENOUS | Status: DC
  Administered 2016-12-06: 07:00:00 via INTRAVENOUS

## 2016-12-06 MED ORDER — HEPARIN (PORCINE) IN NACL 2-0.9 UNIT/ML-% IJ SOLN
INTRAMUSCULAR | Status: AC | PRN
  Administered 2016-12-06: 1000 mL via INTRA_ARTERIAL

## 2016-12-06 MED ORDER — HYDRALAZINE HCL 20 MG/ML IJ SOLN: 5.0000 mg | INTRAMUSCULAR | Status: DC | PRN

## 2016-12-06 MED ORDER — FENTANYL CITRATE (PF) 100 MCG/2ML IJ SOLN
INTRAMUSCULAR | Status: AC
  Filled 2016-12-06: qty 2

## 2016-12-06 MED ORDER — FENTANYL CITRATE (PF) 100 MCG/2ML IJ SOLN
INTRAMUSCULAR | Status: DC | PRN
  Administered 2016-12-06: 50 ug via INTRAVENOUS

## 2016-12-06 MED ORDER — SODIUM CHLORIDE 0.9% FLUSH: 3.0000 mL | Freq: Two times a day (BID) | INTRAVENOUS | Status: DC

## 2016-12-06 MED ORDER — LABETALOL HCL 5 MG/ML IV SOLN
10.0000 mg | INTRAVENOUS | Status: DC | PRN
  Administered 2016-12-06 (×2): 10 mg via INTRAVENOUS

## 2016-12-06 MED ORDER — LIDOCAINE HCL 2 % IJ SOLN
INTRAMUSCULAR | Status: AC
  Filled 2016-12-06: qty 10

## 2016-12-06 MED ORDER — HEPARIN SODIUM (PORCINE) 1000 UNIT/ML IJ SOLN
INTRAMUSCULAR | Status: DC | PRN
  Administered 2016-12-06: 9000 [IU] via INTRAVENOUS

## 2016-12-06 SURGICAL SUPPLY — 18 items
BALLN MUSTANG 5X200X135 (BALLOONS) ×3
BALLOON MUSTANG 5X200X135 (BALLOONS) ×2 IMPLANT
CATH MUSTANG 3X200X135 (BALLOONS) ×3 IMPLANT
CATH OMNI FLUSH 5F 65CM (CATHETERS) ×3 IMPLANT
CATH TEMPO AQUA 5F 100CM (CATHETERS) ×3 IMPLANT
KIT ENCORE 26 ADVANTAGE (KITS) ×3 IMPLANT
KIT MICROINTRODUCER STIFF 5F (SHEATH) ×3 IMPLANT
KIT PV (KITS) ×3 IMPLANT
SHEATH PINNACLE 5F 10CM (SHEATH) ×3 IMPLANT
SHEATH PINNACLE MP 6F 45CM (SHEATH) ×3 IMPLANT
STENT INNOVA 6X150X130 (Permanent Stent) ×6 IMPLANT
SYR MEDRAD MARK V 150ML (SYRINGE) ×3 IMPLANT
TAPE RADIOPAQUE TURBO (MISCELLANEOUS) ×3 IMPLANT
TRANSDUCER W/STOPCOCK (MISCELLANEOUS) ×3 IMPLANT
TRAY PV CATH (CUSTOM PROCEDURE TRAY) ×3 IMPLANT
WIRE BENTSON .035X145CM (WIRE) ×3 IMPLANT
WIRE HI TORQ VERSACORE J 260CM (WIRE) ×3 IMPLANT
WIRE ROSEN-J .035X180CM (WIRE) ×3 IMPLANT

## 2016-12-06 NOTE — Progress Notes (Signed)
Site area: Right groin a 6 french arterial sheath was removed  Site Prior to Removal:  Level 0  Pressure Applied For 20 MINUTES    Bedrest Beginning at 1000am  Manual:   Yes.    Patient Status During Pull:  stable  Post Pull Groin Site:  Level 0  Post Pull Instructions Given:  Yes.    Post Pull Pulses Present:  Yes.    Dressing Applied:  Yes.    Comments:  BP med given

## 2016-12-06 NOTE — Interval H&P Note (Signed)
   History and Physical Update  The patient was interviewed and re-examined.  The patient's previous History and Physical has been reviewed and is unchanged from my consult.  There is no change in the plan of care: LLE angiogram, possible stenting L SFA.   I discussed with the patient the nature of angiographic procedures, especially the limited patencies of any endovascular intervention.    The patient is aware of that the risks of an angiographic procedure include but are not limited to: bleeding, infection, access site complications, renal failure, embolization, rupture of vessel, dissection, arteriovenous fistula, possible need for emergent surgical intervention, possible need for surgical procedures to treat the patient's pathology, anaphylactic reaction to contrast, and stroke and death.    The patient is aware of the risks and agrees to proceed.   Leonides SakeBrian Chen, MD, FACS [facility] [place] Office: [phone] Pager: [phone]  12/06/2016, 7:02 AM

## 2016-12-06 NOTE — Discharge Instructions (Signed)

## 2016-12-06 NOTE — Telephone Encounter (Signed)
Sched lab 12/31/16 at 11:00 and MD 01/04/17 at 11:00. Spoke to pt's wife, they requested same day appt. Resched appt to 01/25/17; lab at 1:30, MD at 2:30.

## 2016-12-06 NOTE — Telephone Encounter (Signed)
-----   Message from Sharee PimpleMarilyn K McChesney, RN sent at 12/06/2016  9:26 AM EDT ----- Regarding: 4 weeks w/ lab   ----- Message ----- From: Fransisco Hertzhen, Brian L, MD Sent: 12/06/2016   9:18 AM To: [facility] [address]  Lillia CarmelGeorge A Steinhart [id number] 04/03/1960  PROCEDURE: 1.  Right common femoral artery cannulation under ultrasound guidance 2.  Placement of catheter in aorta 3.  Second order arterial selection 4.  Left leg runoff via catheter 5.  Angioplasty and stenting left superficial femoral artery x 2 (6 mm x 150 mm, 6 mm x 150 mm) 6.  Conscious sedation for 85 minutes  Follow-up: 4 weeks  Orders(s) for follow-up: LLE ABI

## 2016-12-06 NOTE — Op Note (Signed)
OPERATIVE NOTE   PROCEDURE: 1.  Right common femoral artery cannulation under ultrasound guidance 2.  Placement of catheter in aorta 3.  Second order arterial selection 4.  Left leg runoff via catheter 5.  Angioplasty and stenting left superficial femoral artery x 2 (6 mm x 150 mm, 6 mm x 150 mm) 6.  Conscious sedation for 85 minutes  PRE-OPERATIVE DIAGNOSIS: left foot ulcers  POST-OPERATIVE DIAGNOSIS: same as above   SURGEON: Adele Barthel, MD  ANESTHESIA: conscious sedation  ESTIMATED BLOOD LOSS: 50 cc  CONTRAST: 100 cc  FINDING(S):  Patent left arterial system with 3 vessel runoff  Regression of superficial femoral artery lumen to 2 mm  Sub-total occlusion in proximal 1/3 and distal superficial femoral artery: spiral dissection required placement of stents  Palpable pedal pulses at end of case  No evidence of embolism at end of case  SPECIMEN(S):  none  INDICATIONS:   Willie Morales is a 56 y.o. male who presents with poorly healing left foot ulcer despite successful endovascular intervention of left superficial femoral artery occlusion.  On follow-up vascular studies, recurrent stenosis was indicated.  The patient presents for: left leg runoff, possible intervention.  I discussed with the patient the nature of angiographic procedures, especially the limited patencies of any endovascular intervention.  The patient is aware of that the risks of an angiographic procedure include but are not limited to: bleeding, infection, access site complications, renal failure, embolization, rupture of vessel, dissection, possible need for emergent surgical intervention, possible need for surgical procedures to treat the patient's pathology, and stroke and death.  The patient is aware of the risks and agrees to proceed.  DESCRIPTION: After full informed consent was obtained from the patient, the patient was brought back to the angiography suite.  The patient was placed supine upon the  angiography table and connected to cardiopulmonary monitoring equipment.  The patient was then given conscious sedation, the amounts of which are documented in the patient's chart.  A circulating radiologic technician maintained continuous monitoring of the patient's cardiopulmonary status.  Additionally, the control room radiologic technician provided backup monitoring throughout the procedure.  The patient was prepped and drape in the standard fashion for an angiographic procedure.  At this point, attention was turned to the right groin.  Under ultrasound guidance, the subcutaneous tissue surrounding the right common femoral artery was anesthesized with 1% lidocaine with epinephrine.  The artery was then cannulated with a micropuncture needle.  The microwire was advanced into the iliac arterial system.  The needle was exchanged for a microsheath, which was loaded into the common femoral artery over the wire.  The microwire was exchanged for a Bentson wire which was advanced into the aorta.  The microsheath was then exchanged for a 5-Fr sheath which was loaded into the common femoral artery.  Using a Bentson wire and Omniflush catheter, the left common iliac artery was selected.  The catheter and wire were advanced into the external iliac artery.  The wire was removed and then the catheter was connected to power injector.  An automated left leg runoff was completed.  The wire was exchanged for a Rosen wire.  The patient's right femoral sheath was exchanged for a 6-Fr Destination sheath, which was lodged in the left common femoral artery.  The dilator was removed.  The patient was given 10000 units of Heparin intravenously, which was a therapeutic bolus.  I placed a long straight catheter over the wire and then exchanged the wire  for a long Versacore.  I navigated into the above-the-knee popliteal artery.  I removed the catheter then selected a 3 mm x 200 mm balloon which I inflated at 10 ATM for 2 minute along  the entire length of the superficial femoral artery.  I then exchanged the balloon for a 5 mm x 200 mm balloon which was inflated to 10 ATM for 2 minutes followed by 2 minute inflation at 4 ATM.  I did a completion injection which demonstrated a spiral dissection along the entire length of the superficial femoral artery.  I selected a 6 mm x 150 mm self expanding stent and deployed it to cover the proximal tear.  I post-dilated this stent with the 5 mm x 200 mm balloon.  I did a had injection which demonstrate persistence of the distal dissection, so I selected another 6 mm x 150 mm self-expanding stent and deployed to cover the entire tear with adequate overlap.  I replaced the balloon and inflated the balloon within the two stents at 10 ATM for 1 minute.  The balloon was removed and hand injection demonstrated resolution of the dissection with resolution of prior long stenosis of the superficial femoral artery and sub-total occlusions of the superficial femoral artery.  I did an injection of the tibial arteries.  There no obvious embolism evident.  The wire was removed and then I pulled the right sheath into the right external iliac artery.  The sheath was aspirated.  No clots were present and the sheath was reloaded with heparinized saline.    COMPLICATIONS: none  CONDITION: stable   Adele Barthel, MD, [facility] [place] Office: [phone] Pager: [phone]  12/06/2016, 9:08 AM

## 2016-12-06 NOTE — H&P (View-Only) (Signed)
Established Critical Limb Ischemia Patient   History of Present Illness   Willie Morales is a 56 y.o. (May 16, 1960) male who presents with chief complaint: cold hands and feet.    Procedures: 1.  OA+DCB PTA R fem-pop (07/19/16) for sub-total occlusion of R SFA and pop 2.  L SFA SIA x 2 (07/05/16) for L SFA occlusion  The patient has no rest pain and wounds include: L foot callus.  The patient notes symptoms have not progressed.  The patient's treatment regimen currently included: maximal medical management.  Pt continues to smoke.  The patient's PMH, PSH, SH, and FamHx are unchanged from 08/17/16.  Current Outpatient Prescriptions  Medication Sig Dispense Refill  . apixaban (ELIQUIS) 5 MG TABS tablet Take 1 tablet (5 mg total) by mouth 2 (two) times daily. (Patient taking differently: Take 5 mg by mouth daily. ) 60 tablet 6  . benazepril (LOTENSIN) 10 MG tablet Take 1 tablet (10 mg total) by mouth daily. 30 tablet 6  . clopidogrel (PLAVIX) 75 MG tablet Take 1 tablet (75 mg total) by mouth daily. 30 tablet 11  . gabapentin (NEURONTIN) 300 MG capsule Take 1 capsule (300 mg total) by mouth 3 (three) times daily. 90 capsule 11  . glimepiride (AMARYL) 4 MG tablet Take 1 tablet (4 mg total) by mouth daily with breakfast. 30 tablet 6  . HYDROcodone-acetaminophen (NORCO/VICODIN) 5-325 MG tablet Take one-two tabs po q 4-6 hrs prn pain (Patient not taking: Reported on 08/17/2016) 15 tablet 0  . metFORMIN (GLUCOPHAGE) 500 MG tablet Take 2 tablets (1,000 mg total) by mouth 3 (three) times daily. 90 tablet 6  . metoprolol tartrate (LOPRESSOR) 25 MG tablet Take 1 tablet (25 mg total) by mouth 2 (two) times daily. (Patient taking differently: Take 25 mg by mouth daily. ) 60 tablet 6  . oxyCODONE-acetaminophen (ROXICET) 5-325 MG tablet Take 1 tablet by mouth every 6 (six) hours as needed for moderate pain. (Patient not taking: Reported on 08/17/2016) 10 tablet 0   No current facility-administered  medications for this visit.     On ROS today: no rest pain, no ulcers   Physical Examination   Vitals:   11/16/16 1431 11/16/16 1438  BP: (!) 148/100 138/88  Pulse: 87   SpO2: 99%   Weight: 186 lb 9.6 oz (84.6 kg)   Height: 6' (1.829 m)    Body mass index is 25.31 kg/m.  General Alert, O x 3, WD, NAD  Pulmonary Sym exp, good B air movt, CTA B  Cardiac RRR, Nl S1, S2, no Murmurs, No rubs, No S3,S4  Vascular Vessel Right Left  Radial Palpable Palpable  Brachial Palpable Palpable  Carotid Palpable, No Bruit Palpable, No Bruit  Aorta Not palpable N/A  Femoral Palpable Palpable  Popliteal Not palpable Not palpable  PT Not palpable Not palpable  DP Not palpable Not palpable    Gastro- intestinal soft, non-distended, non-tender to palpation, No guarding or rebound, no HSM, no masses, no CVAT B, No palpable prominent aortic pulse,    Musculo- skeletal M/S 5/5 throughout  , Extremities without ischemic changes  , No edema present, No visible varicosities , No Lipodermatosclerosis present, L calf somewhat cynaotic  Neurologic Cranial nerves 2-12 intact , Pain and light touch intact in extremities , Motor exam as listed above    Non-Invasive Vascular Imaging   ABI (11/16/2016)  R:   ABI: 0.72 (1.21),   PT: mono  DP: mono  TBI:  0.72  L:  ABI: 0.57 (0.98),   PT: mono  DP: mono  TBI: 0.57  BLE Arterial duplex (11/16/2016)  R: biphasic throughout  L:   CFA and PFA: triphasic  SFA: 39-509, Vr 4.6  Rest of arterial system: Monophasic  B Carotid Duplex (11/16/2016):   R ICA stenosis:  60-79%  R VA: patent and antegrade  L ICA stenosis:  60-79%  L VA: patent and antegrade    Medical Decision Making   Willie Morales is a 56 y.o. male who presents with:s/p R OA+DCB PTA for sub-total occlusion of distal R SFA and AK pop, s/p L SFA SIA x 2 for L foot ulcers, likely diabetic neuropathy   Based on the patient's vascular studies and examination, I have  offered the patient: LRo, likely reintervention on L SFA.  Given relatively rapid recurrence, will likely need a stent placed.. I discussed with the patient the nature of angiographic procedures, especially the limited patencies of any endovascular intervention.   The patient is aware of that the risks of an angiographic procedure include but are not limited to: bleeding, infection, access site complications, renal failure, embolization, rupture of vessel, dissection, arteriovenous fistula, possible need for emergent surgical intervention, possible need for surgical procedures to treat the patient's pathology, anaphylactic reaction to contrast, and stroke and death.   The patient is aware of the risks and agrees to proceed on 18 OCT 18.  His brother recently died, so he needs to delay any procedures until those issues are dealt with.  I discussed in depth with the patient the nature of atherosclerosis, and emphasized the importance of maximal medical management including strict control of blood pressure, blood glucose, and lipid levels, antiplatelet agents, obtaining regular exercise, and cessation of smoking.    The patient is aware that without maximal medical management the underlying atherosclerotic disease process will progress, limiting the benefit of any interventions. The patient is currently not on statin per pt's preference.  I had recommend he start one previously. The patient is currently on an anti-platelet: Plavix.  I reiterated he needs to stop smoking.   Thank you for allowing us to participate in this patient's care.   Leonides SakeBrian Aison Malveaux, MD, FACS [facility] [place] Office: [phone] Pager: [phone]

## 2016-12-07 MED FILL — Lidocaine HCl Local Inj 2%: INTRAMUSCULAR | Qty: 10 | Status: AC

## 2016-12-10 ENCOUNTER — Encounter (HOSPITAL_COMMUNITY): Payer: BLUE CROSS/BLUE SHIELD | Attending: Oncology | Admitting: Oncology

## 2016-12-10 ENCOUNTER — Encounter (HOSPITAL_COMMUNITY): Payer: Self-pay

## 2016-12-10 ENCOUNTER — Other Ambulatory Visit: Payer: Self-pay

## 2016-12-10 ENCOUNTER — Other Ambulatory Visit (HOSPITAL_COMMUNITY)
Admission: RE | Admit: 2016-12-10 | Discharge: 2016-12-10 | Disposition: A | Discharge status: Home / Self Care | Payer: BLUE CROSS/BLUE SHIELD | Source: Ambulatory Visit | Attending: Oncology | Admitting: Oncology

## 2016-12-10 ENCOUNTER — Encounter (HOSPITAL_COMMUNITY): Payer: BLUE CROSS/BLUE SHIELD

## 2016-12-10 DIAGNOSIS — E785 Hyperlipidemia, unspecified: Secondary | ICD-10-CM | POA: Diagnosis not present

## 2016-12-10 DIAGNOSIS — I10 Essential (primary) hypertension: Secondary | ICD-10-CM | POA: Diagnosis not present

## 2016-12-10 DIAGNOSIS — Z7982 Long term (current) use of aspirin: Secondary | ICD-10-CM | POA: Diagnosis not present

## 2016-12-10 DIAGNOSIS — I70249 Atherosclerosis of native arteries of left leg with ulceration of unspecified site: Secondary | ICD-10-CM

## 2016-12-10 DIAGNOSIS — Z7902 Long term (current) use of antithrombotics/antiplatelets: Secondary | ICD-10-CM | POA: Insufficient documentation

## 2016-12-10 DIAGNOSIS — D72829 Elevated white blood cell count, unspecified: Secondary | ICD-10-CM | POA: Diagnosis present

## 2016-12-10 DIAGNOSIS — Z881 Allergy status to other antibiotic agents status: Secondary | ICD-10-CM | POA: Insufficient documentation

## 2016-12-10 DIAGNOSIS — F17210 Nicotine dependence, cigarettes, uncomplicated: Secondary | ICD-10-CM | POA: Diagnosis not present

## 2016-12-10 DIAGNOSIS — Z48812 Encounter for surgical aftercare following surgery on the circulatory system: Secondary | ICD-10-CM

## 2016-12-10 DIAGNOSIS — E1151 Type 2 diabetes mellitus with diabetic peripheral angiopathy without gangrene: Secondary | ICD-10-CM | POA: Insufficient documentation

## 2016-12-10 DIAGNOSIS — F17218 Nicotine dependence, cigarettes, with other nicotine-induced disorders: Secondary | ICD-10-CM | POA: Diagnosis not present

## 2016-12-10 DIAGNOSIS — I480 Paroxysmal atrial fibrillation: Secondary | ICD-10-CM | POA: Diagnosis not present

## 2016-12-10 DIAGNOSIS — Z7984 Long term (current) use of oral hypoglycemic drugs: Secondary | ICD-10-CM | POA: Insufficient documentation

## 2016-12-10 DIAGNOSIS — Z79899 Other long term (current) drug therapy: Secondary | ICD-10-CM | POA: Insufficient documentation

## 2016-12-10 DIAGNOSIS — Z7901 Long term (current) use of anticoagulants: Secondary | ICD-10-CM | POA: Insufficient documentation

## 2016-12-10 DIAGNOSIS — I70239 Atherosclerosis of native arteries of right leg with ulceration of unspecified site: Secondary | ICD-10-CM

## 2016-12-10 DIAGNOSIS — I2510 Atherosclerotic heart disease of native coronary artery without angina pectoris: Secondary | ICD-10-CM | POA: Diagnosis not present

## 2016-12-10 LAB — COMPREHENSIVE METABOLIC PANEL
ALT: 18 U/L (ref 17–63)
AST: 17 U/L (ref 15–41)
Albumin: 4.3 g/dL (ref 3.5–5.0)
Alkaline Phosphatase: 81 U/L (ref 38–126)
Anion gap: 12 (ref 5–15)
BUN: 26 mg/dL — ABNORMAL HIGH (ref 6–20)
CO2: 22 mmol/L (ref 22–32)
Calcium: 10.3 mg/dL (ref 8.9–10.3)
Chloride: 101 mmol/L (ref 101–111)
Creatinine, Ser: 1.14 mg/dL (ref 0.61–1.24)
GFR calc Af Amer: >60 mL/min (ref >60)
GFR calc non Af Amer: >60 mL/min (ref >60)
Glucose, Bld: 203 mg/dL — ABNORMAL HIGH (ref 65–99)
Potassium: 4.5 mmol/L (ref 3.5–5.1)
Sodium: 135 mmol/L (ref 135–145)
Total Bilirubin: 0.5 mg/dL (ref 0.3–1.2)
Total Protein: 9.1 g/dL — ABNORMAL HIGH (ref 6.5–8.1)

## 2016-12-10 LAB — CBC WITH DIFFERENTIAL/PLATELET
Basophils Absolute: 0.1 10*3/uL (ref 0.0–0.1)
Basophils Relative: 0 %
Eosinophils Absolute: 0.4 10*3/uL (ref 0.0–0.7)
Eosinophils Relative: 2 %
HCT: 45.7 % (ref 39.0–52.0)
Hemoglobin: 15.3 g/dL (ref 13.0–17.0)
Lymphocytes Relative: 20 %
Lymphs Abs: 2.9 10*3/uL (ref 0.7–4.0)
MCH: 30.2 pg (ref 26.0–34.0)
MCHC: 33.5 g/dL (ref 30.0–36.0)
MCV: 90.1 fL (ref 78.0–100.0)
Monocytes Absolute: 1.3 10*3/uL — ABNORMAL HIGH (ref 0.1–1.0)
Monocytes Relative: 9 %
Neutro Abs: 9.9 10*3/uL — ABNORMAL HIGH (ref 1.7–7.7)
Neutrophils Relative %: 69 %
Platelets: 233 10*3/uL (ref 150–400)
RBC: 5.07 MIL/uL (ref 4.22–5.81)
RDW: 13.4 % (ref 11.5–15.5)
WBC: 14.5 10*3/uL — ABNORMAL HIGH (ref 4.0–10.5)

## 2016-12-10 NOTE — Progress Notes (Signed)
[facility] Cancer Initial Visit:  Patient Care Team: Benita StabileHall, John Z, MD as PCP - General (Internal Medicine)  CHIEF COMPLAINTS/PURPOSE OF CONSULTATION:  Persistent mild leukocytosis  HISTORY OF PRESENTING ILLNESS: Willie Morales 56 y.o. male is here because of persistent mild leukocytosis. Patient states that his primary care physician has noted that his white blood cell count has been elevated for at least 3 months. Most recent CBC from 10/29/16 demonstrated WBC 15.3 K, hemoglobin 14.7 g/dL, hematocrit 40.9%43.5%, MCV 88, platelet count 195k, differential demonstrated elevated ANC of 10.2 K, absolute lymphocyte count 3.8K, absolute monocytes elevated at 1K. Patient denies any chronic infections. He denies any dental caries or sinus issues are chronic allergies. He states that he recently had a metal stent placed to his left leg on Thursday for claudication symptoms. He states he is a current smoker and smokes 1 pack per day, which she has had for the past 30+ years. He denies any chronic steroid use. He denies any fatigue, chest pain, shortness breath, abdominal pain, diarrhea, nausea, vomiting, focal weakness. His appetite is good and denies any weight loss.  Review of Systems - Oncology ROS as per HPI otherwise 12 point ROS is negative.  MEDICAL HISTORY: Past Medical History:  Diagnosis Date  . Coronary artery disease   . Diabetes mellitus   . Dyslipidemia   . History of kidney stones   . Hypertension   . Paroxysmal atrial fibrillation (HCC) 12/2010  . Tobacco abuse     SURGICAL HISTORY: Past Surgical History:  Procedure Laterality Date  . ABDOMINAL AORTOGRAM W/LOWER EXTREMITY N/A 07/05/2016   Procedure: Abdominal Aortogram w/Lower Extremity;  Surgeon: Fransisco Hertzhen, Brian L, MD;  [facility];  Service: Cardiovascular;  Laterality: N/A;  . BACK SURGERY  1991  . CARDIAC CATHETERIZATION  07/31/2007   This showed nonobstructive coronary artery disease   He had 30%  lesion in the mid RCA, 30% lesion in the PDA, 45% lesion in the LAD and mid  LAD, and 30% lesion in the diagonal.  The left circumflex was normal.  The LV function was 60%.     Marland Kitchen. KIDNEY STONE SURGERY  2011  . LOWER EXTREMITY ANGIOGRAPHY N/A 12/06/2016   Procedure: Lower Extremity Angiography;  Surgeon: Fransisco Hertzhen, Brian L, MD;  [facility];  Service: Cardiovascular;  Laterality: N/A;  . PERIPHERAL VASCULAR ATHERECTOMY Right 07/19/2016   Procedure: Peripheral Vascular Atherectomy;  Surgeon: Fransisco Hertzhen, Brian L, MD;  [facility];  Service: Cardiovascular;  Laterality: Right;  . PERIPHERAL VASCULAR BALLOON ANGIOPLASTY Left 07/05/2016   Procedure: Peripheral Vascular Balloon Angioplasty;  Surgeon: Fransisco Hertzhen, Brian L, MD;  [facility];  Service: Cardiovascular;  Laterality: Left;  SFA  . PERIPHERAL VASCULAR INTERVENTION  12/06/2016   Procedure: PERIPHERAL VASCULAR INTERVENTION;  Surgeon: Fransisco Hertzhen, Brian L, MD;  [facility];  Service: Cardiovascular;;  LEFT    SOCIAL HISTORY: Social History   Social History  . Marital status: Married    Spouse name: N/A  . Number of children: N/A  . Years of education: N/A   Occupational History  . plumber     by trade   Social History Main Topics  . Smoking status: Current Every Day Smoker    Packs/day: 1.00    Years: 35.00    Types: Cigarettes    Last attempt to quit: 12/18/2015  . Smokeless tobacco: Never Used     Comment: quit "3 months ago"  .  Alcohol use No  . Drug use: No  . Sexual activity: Not on file   Other Topics Concern  . Not on file   Social History Narrative   Has 2 children    Lives in [place] with wife   Currently unemployed    FAMILY HISTORY Family History  Problem Relation Age of Onset  . Coronary artery disease Father        CABG in early 12's  . Heart attack Father   . Heart disease Father   . Cancer Mother   . Heart disease Brother   . Diabetes Maternal  Grandmother   . SIDS Sister     ALLERGIES:  is allergic to ciprofloxacin.  MEDICATIONS:  Current Outpatient Prescriptions  Medication Sig Dispense Refill  . apixaban (ELIQUIS) 5 MG TABS tablet Take 1 tablet (5 mg total) by mouth 2 (two) times daily. 60 tablet 6  . aspirin EC 81 MG tablet Take 81 mg by mouth daily.    Marland Kitchen atorvastatin (LIPITOR) 10 MG tablet Take 10 mg by mouth daily.   1  . benazepril (LOTENSIN) 10 MG tablet Take 1 tablet (10 mg total) by mouth daily. 30 tablet 6  . clopidogrel (PLAVIX) 75 MG tablet Take 1 tablet (75 mg total) by mouth daily. 30 tablet 11  . gabapentin (NEURONTIN) 300 MG capsule Take 1 capsule (300 mg total) by mouth 3 (three) times daily. (Patient taking differently: Take 300 mg by mouth daily. ) 90 capsule 11  . glimepiride (AMARYL) 4 MG tablet Take 1 tablet (4 mg total) by mouth daily with breakfast. 30 tablet 6  . HYDROcodone-acetaminophen (NORCO/VICODIN) 5-325 MG tablet Take one-two tabs po q 4-6 hrs prn pain (Patient taking differently: Take 1-2 tablets by mouth every 4 (four) hours as needed (for pain.). ) 15 tablet 0  . levothyroxine (SYNTHROID, LEVOTHROID) 25 MCG tablet Take 25 mcg by mouth daily before breakfast.    . metoprolol tartrate (LOPRESSOR) 25 MG tablet Take 1 tablet (25 mg total) by mouth 2 (two) times daily. (Patient taking differently: Take 25 mg by mouth daily. ) 60 tablet 6  . sitaGLIPtin-metformin (JANUMET) 50-1000 MG tablet Take 1 tablet by mouth 2 (two) times daily.     No current facility-administered medications for this visit.     PHYSICAL EXAMINATION:  ECOG PERFORMANCE STATUS: 0 - Asymptomatic   Vitals:   12/10/16 1316  BP: (!) 126/102  Pulse: (!) 101  Resp: 18  SpO2: 98%    Filed Weights   12/10/16 1316  Weight: 190 lb (86.2 kg)     Physical Exam Constitutional: Well-developed, well-nourished, and in no distress.   HENT:  Head: Normocephalic and atraumatic.  Mouth/Throat: No oropharyngeal exudate. Mucosa  moist. Eyes: Pupils are equal, round, and reactive to light. Conjunctivae are normal. No scleral icterus.  Neck: Normal range of motion. Neck supple. No JVD present.  Cardiovascular: Normal rate, regular rhythm and normal heart sounds.  Exam reveals no gallop and no friction rub.   No murmur heard. Pulmonary/Chest: Effort normal and breath sounds normal. No respiratory distress. No wheezes.No rales.  Abdominal: Soft. Bowel sounds are normal. No distension. There is no tenderness. There is no guarding.  Musculoskeletal: No edema or tenderness.  Lymphadenopathy:    No cervical or supraclavicular adenopathy.  Neurological: Alert and oriented to person, place, and time. No cranial nerve deficit.  Skin: Skin is warm and dry. No rash noted. No erythema. No pallor.  Psychiatric: Affect and judgment normal.  LABORATORY DATA: I have personally reviewed the data as listed:  Admission on 12/06/2016, Discharged on 12/06/2016  Component Date Value Ref Range Status  . Sodium 12/06/2016 137  135 - 145 mmol/L Final  . Potassium 12/06/2016 4.4  3.5 - 5.1 mmol/L Final  . Chloride 12/06/2016 107  101 - 111 mmol/L Final  . BUN 12/06/2016 17  6 - 20 mg/dL Final  . Creatinine, Ser 12/06/2016 0.80  0.61 - 1.24 mg/dL Final  . Glucose, Bld 40/98/1191 236* 65 - 99 mg/dL Final  . Calcium, Ion 47/82/9562 1.08* 1.15 - 1.40 mmol/L Final  . TCO2 12/06/2016 20* 22 - 32 mmol/L Final  . Hemoglobin 12/06/2016 14.6  13.0 - 17.0 g/dL Final  . HCT 13/09/6576 43.0  39.0 - 52.0 % Final  . Glucose-Capillary 12/06/2016 209* 65 - 99 mg/dL Final  . Activated Clotting Time 12/06/2016 263  seconds Final  . Activated Clotting Time 12/06/2016 208  seconds Final  . Activated Clotting Time 12/06/2016 164  seconds Final  Hospital Outpatient Visit on 11/16/2016  Component Date Value Ref Range Status  . Right CCA prox sys 11/16/2016 206  cm/s Final  . Right CCA prox dias 11/16/2016 44  cm/s Final  . Right cca dist sys 11/16/2016  -140  cm/s Final  . Left CCA prox sys 11/16/2016 78  cm/s Final  . Left CCA prox dias 11/16/2016 6  cm/s Final  . Left CCA dist sys 11/16/2016 -24  cm/s Final  . Left CCA dist dias 11/16/2016 -5  cm/s Final  . Left ICA prox sys 11/16/2016 0  cm/s Final  . Left ICA prox dias 11/16/2016 0  cm/s Final  . RIGHT CCA MID DIAS 11/16/2016 37.00  cm/s Final  . RIGHT ECA DIAS 11/16/2016 -239.00  cm/s Final  . RIGHT VERTEBRAL DIAS 11/16/2016 20.00  cm/s Final  . LEFT ECA DIAS 11/16/2016 -59.00  cm/s Final  Hospital Outpatient Visit on 11/16/2016  Component Date Value Ref Range Status  . Left super femoral prox sys PSV 11/16/2016 -112  cm/s Final  . Left super femoral mid sys PSV 11/16/2016 -107  cm/s Final  . Left super femoral dist sys PSV 11/16/2016 -39  cm/s Final  . Left popliteal prox sys PSV 11/16/2016 40  cm/s Final  . Left popliteal dist sys PSV 11/16/2016 -49  cm/s Final  . Right super femoral prox sys PSV 11/16/2016 -116  cm/s Final  . Right super femoral mid sys PSV 11/16/2016 -128  cm/s Final  . Right super femoral dist sys PSV 11/16/2016 -150  cm/s Final  . Right popliteal prox sys PSV 11/16/2016 162  cm/s Final  . Right popliteal dist sys PSV 11/16/2016 -95  cm/s Final  . Right peroneal sys PSV 11/16/2016 23  cm/s Final  . Left ant tibial distal sys 11/16/2016 15  cm/s Final  . left post tibial dist sys 11/16/2016 16  cm/s Final  . RIGHT ANT DIST TIBAL SYS PSV 11/16/2016 23  cm/s Final  . RIGHT POST TIB DIST SYS 11/16/2016 34  cm/s Final    RADIOGRAPHIC STUDIES: I have personally reviewed the radiological images as listed and agree with the findings in the report  No results found.  ASSESSMENT/PLAN Mild leukocytosis Chronic smoker  PLAN: -Will repeat his CBC.  -Check a peripheral flow cytometry to rule out CLL.  -Pathologist review of peripheral smear. -Check JAK2 mutation to rule out myeloproliferative disorder. -Check BCR-ABL to rule out CML. -RTC in 4 weeks for  follow up and review of labs and to discuss the next plan of care. -counseled on smoke cessation. Chronic smoking can cause a mild leukocytosis.  Orders Placed This Encounter  Procedures  . CBC with Differential    Standing Status:   Future    Standing Expiration Date:   12/10/2017  . Comprehensive metabolic panel    Standing Status:   Future    Standing Expiration Date:   12/10/2017  . BCR-ABL1, CML/ALL, PCR, QUANT  . JAK2 V617F, w Reflex to CALR/E12/MPL  . Miscellaneous [facility] test (send-out)    Standing Status:   Future    Standing Expiration Date:   12/10/2017    Order Specific Question:   Test name / description:    Answer:   peripheral blood flow cytometry  . Pathologist smear review    Standing Status:   Future    Standing Expiration Date:   12/10/2017    All questions were answered. The patient knows to call the clinic with any problems, questions or concerns.  This note was electronically signed.    Ralene Cork, MD  12/10/2016 1:22 PM

## 2016-12-11 DIAGNOSIS — M79676 Pain in unspecified toe(s): Secondary | ICD-10-CM

## 2016-12-11 LAB — PATHOLOGIST SMEAR REVIEW

## 2016-12-14 ENCOUNTER — Other Ambulatory Visit: Payer: Self-pay | Admitting: Adult Health

## 2016-12-17 LAB — BCR-ABL1, CML/ALL, PCR, QUANT

## 2016-12-20 LAB — CALR + JAK2 E12-15 + MPL (REFLEXED)

## 2016-12-20 LAB — JAK2 V617F, W REFLEX TO CALR/E12/MPL

## 2016-12-31 ENCOUNTER — Encounter (HOSPITAL_COMMUNITY): Payer: PRIVATE HEALTH INSURANCE

## 2017-01-04 ENCOUNTER — Ambulatory Visit: Payer: PRIVATE HEALTH INSURANCE | Admitting: Vascular Surgery

## 2017-01-09 ENCOUNTER — Ambulatory Visit (HOSPITAL_COMMUNITY): Payer: BLUE CROSS/BLUE SHIELD

## 2017-01-09 NOTE — Telephone Encounter (Signed)
No documentation 

## 2017-01-22 NOTE — Progress Notes (Signed)
Postoperative Visit (Angio)   History of Present Illness   Willie Morales is a 56 y.o. male who presents cc:  Bilateral neuropathic pain.  Prior procedure include: 1. L SFA PTA+S (12/06/16) 2. OA+DCB PTA R fem-pop (07/19/16) for sub-total occlusion of R SFA and pop 3. L SFA SIA x 2 (07/05/16) for L SFA occlusion  The patient's wounds are healed.  The patient notes bilateral neuropathic pain in legs related to diabetes.  The patient is able to complete their activities of daily living.    Past Medical History, Past Surgical History, Social History, Family History, Medications, Allergies, and Review of Systems are unchanged from previous evaluation on 12/06/16.  Current Outpatient Medications  Medication Sig Dispense Refill  . apixaban (ELIQUIS) 5 MG TABS tablet Take 1 tablet (5 mg total) by mouth 2 (two) times daily. 60 tablet 6  . aspirin EC 81 MG tablet Take 81 mg by mouth daily.    Marland Kitchen. atorvastatin (LIPITOR) 10 MG tablet Take 10 mg by mouth daily.   1  . benazepril (LOTENSIN) 10 MG tablet Take 1 tablet (10 mg total) by mouth daily. 30 tablet 6  . clopidogrel (PLAVIX) 75 MG tablet Take 1 tablet (75 mg total) by mouth daily. 30 tablet 11  . gabapentin (NEURONTIN) 300 MG capsule Take 1 capsule (300 mg total) by mouth 3 (three) times daily. (Patient taking differently: Take 300 mg by mouth daily. ) 90 capsule 11  . glimepiride (AMARYL) 4 MG tablet Take 1 tablet (4 mg total) by mouth daily with breakfast. 30 tablet 6  . HYDROcodone-acetaminophen (NORCO/VICODIN) 5-325 MG tablet Take one-two tabs po q 4-6 hrs prn pain (Patient taking differently: Take 1-2 tablets by mouth every 4 (four) hours as needed (for pain.). ) 15 tablet 0  . levothyroxine (SYNTHROID, LEVOTHROID) 25 MCG tablet Take 25 mcg by mouth daily before breakfast.    . metoprolol tartrate (LOPRESSOR) 25 MG tablet Take 1 tablet (25 mg total) by mouth 2 (two) times daily. (Patient taking differently: Take 25 mg by mouth daily. )  60 tablet 6  . sitaGLIPtin-metformin (JANUMET) 50-1000 MG tablet Take 1 tablet by mouth 2 (two) times daily.     No current facility-administered medications for this visit.     ROS: no rest pain, diabetic neuropathy in both legs   For VQI Use Only   PRE-ADM LIVING: Home  AMB STATUS: Ambulatory   Physical Examination   Vitals:   01/25/17 1410  BP: 103/76  Pulse: 83  Resp: 20  Temp: 99.1 F (37.3 C)  TempSrc: Oral  SpO2: 100%  Weight: 190 lb (86.2 kg)  Height: 6\' 1"  (1.854 m)   Body mass index is 25.07 kg/m.  General Alert, O x 3, WD, NAD  Pulmonary Sym exp, good B air movt, CTA B  Cardiac RRR, Nl S1, S2, no Murmurs, No rubs, No S3,S4  Vascular Vessel Right Left  Radial Palpable Palpable  Brachial Palpable Palpable  Carotid Palpable, No Bruit Palpable, No Bruit  Aorta Not palpable N/A  Femoral Palpable Palpable  Popliteal Not palpable Not palpable  PT Faintly palpable Faintly palpable  DP Faintly palpable Not palpable    Gastrointestinal soft, non-distended, non-tender to palpation, No guarding or rebound, no HSM, no masses, no CVAT B, No palpable prominent aortic pulse,    Musculoskeletal M/S 5/5 throughout  , Extremities without ischemic changes  , No edema present,  Neurologic  Pain and light touch intact in extremities , Motor  exam as listed above    Non-invasive Vascular Imaging   ABI (01/25/17)  R:   ABI: 0.80 (0.70),   PT: mono  DP: mono  TBI:  0.74  L:   ABI: 0.76 (0.54),   PT: mono  DP: mono  TBI: 0.57   Medical Decision Making   Willie Morales is a 56 y.o. male who presents s/p R OA+DCB PTA for sub-total occlusion of distal R SFA and AK pop, s/p L SFA SIA x 2, PTA+S L SFA for L foot ulcers, likely diabetic neuropathy  I started this patient on Lyrica 50 mg 1 PO tid for his diabetic neuropathy. Based on his angiographic findings, this patient needs: q3 ABI and BLE arterial duplex. I discussed in depth with the patient the nature  of atherosclerosis, and emphasized the importance of maximal medical management including strict control of blood pressure, blood glucose, and lipid levels, obtaining regular exercise, and cessation of smoking.  The patient is aware that without maximal medical management the underlying atherosclerotic disease process will progress, limiting the benefit of any interventions. The patient is currently not on statin despite recommendations to start one.  The patient is currently on an anti-platelet: Plavix.  Thank you for allowing us to participate in this patient's care.   Leonides SakeBrian Tamika Shropshire, MD, FACS [facility] Office: [phone] Pager: [phone]

## 2017-01-25 ENCOUNTER — Ambulatory Visit (INDEPENDENT_AMBULATORY_CARE_PROVIDER_SITE_OTHER): Payer: BLUE CROSS/BLUE SHIELD | Admitting: Vascular Surgery

## 2017-01-25 ENCOUNTER — Encounter: Payer: Self-pay | Admitting: Vascular Surgery

## 2017-01-25 ENCOUNTER — Ambulatory Visit (HOSPITAL_COMMUNITY)
Admission: RE | Admit: 2017-01-25 | Discharge: 2017-01-25 | Disposition: A | Discharge status: Home / Self Care | Payer: BLUE CROSS/BLUE SHIELD | Source: Ambulatory Visit | Attending: Vascular Surgery | Admitting: Vascular Surgery

## 2017-01-25 VITALS — BP 103/76 | HR 83 | Temp 99.1°F | Resp 20 | Ht 73.0 in | Wt 190.0 lb

## 2017-01-25 DIAGNOSIS — Z48812 Encounter for surgical aftercare following surgery on the circulatory system: Secondary | ICD-10-CM | POA: Diagnosis not present

## 2017-01-25 DIAGNOSIS — I7025 Atherosclerosis of native arteries of other extremities with ulceration: Secondary | ICD-10-CM

## 2017-01-25 DIAGNOSIS — I70239 Atherosclerosis of native arteries of right leg with ulceration of unspecified site: Secondary | ICD-10-CM | POA: Diagnosis not present

## 2017-01-25 DIAGNOSIS — I70249 Atherosclerosis of native arteries of left leg with ulceration of unspecified site: Secondary | ICD-10-CM | POA: Diagnosis not present

## 2017-01-25 MED ORDER — PREGABALIN 50 MG PO CAPS: 50.0000 mg | ORAL_CAPSULE | Freq: Three times a day (TID) | ORAL | 2 refills | Status: DC

## 2017-02-17 ENCOUNTER — Emergency Department (HOSPITAL_COMMUNITY): Payer: BLUE CROSS/BLUE SHIELD

## 2017-02-17 ENCOUNTER — Emergency Department (HOSPITAL_COMMUNITY)
Admission: EM | Admit: 2017-02-17 | Discharge: 2017-02-17 | Disposition: A | Discharge status: Home / Self Care | Payer: BLUE CROSS/BLUE SHIELD | Attending: Emergency Medicine | Admitting: Emergency Medicine

## 2017-02-17 ENCOUNTER — Other Ambulatory Visit: Payer: Self-pay

## 2017-02-17 ENCOUNTER — Encounter (HOSPITAL_COMMUNITY): Payer: Self-pay

## 2017-02-17 DIAGNOSIS — Z7901 Long term (current) use of anticoagulants: Secondary | ICD-10-CM | POA: Insufficient documentation

## 2017-02-17 DIAGNOSIS — F17210 Nicotine dependence, cigarettes, uncomplicated: Secondary | ICD-10-CM | POA: Insufficient documentation

## 2017-02-17 DIAGNOSIS — E11620 Type 2 diabetes mellitus with diabetic dermatitis: Secondary | ICD-10-CM | POA: Insufficient documentation

## 2017-02-17 DIAGNOSIS — Z7984 Long term (current) use of oral hypoglycemic drugs: Secondary | ICD-10-CM | POA: Diagnosis not present

## 2017-02-17 DIAGNOSIS — R1032 Left lower quadrant pain: Secondary | ICD-10-CM | POA: Insufficient documentation

## 2017-02-17 DIAGNOSIS — I2510 Atherosclerotic heart disease of native coronary artery without angina pectoris: Secondary | ICD-10-CM | POA: Insufficient documentation

## 2017-02-17 DIAGNOSIS — Z79899 Other long term (current) drug therapy: Secondary | ICD-10-CM | POA: Diagnosis not present

## 2017-02-17 DIAGNOSIS — Z7902 Long term (current) use of antithrombotics/antiplatelets: Secondary | ICD-10-CM | POA: Diagnosis not present

## 2017-02-17 DIAGNOSIS — R748 Abnormal levels of other serum enzymes: Secondary | ICD-10-CM | POA: Diagnosis not present

## 2017-02-17 DIAGNOSIS — I10 Essential (primary) hypertension: Secondary | ICD-10-CM | POA: Insufficient documentation

## 2017-02-17 DIAGNOSIS — R109 Unspecified abdominal pain: Secondary | ICD-10-CM

## 2017-02-17 LAB — CBC
HCT: 44.6 % (ref 39.0–52.0)
Hemoglobin: 14.3 g/dL (ref 13.0–17.0)
MCH: 29.1 pg (ref 26.0–34.0)
MCHC: 32.1 g/dL (ref 30.0–36.0)
MCV: 90.8 fL (ref 78.0–100.0)
Platelets: 203 10*3/uL (ref 150–400)
RBC: 4.91 MIL/uL (ref 4.22–5.81)
RDW: 14.7 % (ref 11.5–15.5)
WBC: 11.6 10*3/uL — ABNORMAL HIGH (ref 4.0–10.5)

## 2017-02-17 LAB — URINALYSIS, ROUTINE W REFLEX MICROSCOPIC
Bacteria, UA: NONE SEEN
Bilirubin Urine: NEGATIVE
Glucose, UA: >=500 mg/dL — AB
Hgb urine dipstick: NEGATIVE
Ketones, ur: NEGATIVE mg/dL
Leukocytes, UA: NEGATIVE
Nitrite: NEGATIVE
Protein, ur: NEGATIVE mg/dL
Specific Gravity, Urine: 1.018 (ref 1.005–1.030)
pH: 5 (ref 5.0–8.0)

## 2017-02-17 LAB — COMPREHENSIVE METABOLIC PANEL
ALT: 15 U/L — ABNORMAL LOW (ref 17–63)
AST: 17 U/L (ref 15–41)
Albumin: 4.1 g/dL (ref 3.5–5.0)
Alkaline Phosphatase: 80 U/L (ref 38–126)
Anion gap: 13 (ref 5–15)
BUN: 17 mg/dL (ref 6–20)
CO2: 22 mmol/L (ref 22–32)
Calcium: 9.7 mg/dL (ref 8.9–10.3)
Chloride: 103 mmol/L (ref 101–111)
Creatinine, Ser: 1.04 mg/dL (ref 0.61–1.24)
GFR calc Af Amer: >60 mL/min (ref >60)
GFR calc non Af Amer: >60 mL/min (ref >60)
Glucose, Bld: 256 mg/dL — ABNORMAL HIGH (ref 65–99)
Potassium: 3.8 mmol/L (ref 3.5–5.1)
Sodium: 138 mmol/L (ref 135–145)
Total Bilirubin: 0.1 mg/dL — ABNORMAL LOW (ref 0.3–1.2)
Total Protein: 8.6 g/dL — ABNORMAL HIGH (ref 6.5–8.1)

## 2017-02-17 LAB — LIPASE, BLOOD: Lipase: 102 U/L — ABNORMAL HIGH (ref 11–51)

## 2017-02-17 NOTE — ED Notes (Signed)
SO reports she is leaving but will return soon

## 2017-02-17 NOTE — ED Triage Notes (Signed)
Pt complaining of left flank pain started 2 days ago. States the pain is sharp. Rates pain an 8/10. Has a history of kidney stones, but states this doesn't feel the same.

## 2017-02-17 NOTE — ED Notes (Signed)
Labs spec to lab

## 2017-02-17 NOTE — ED Notes (Signed)
Pt reports flank pain that has now transferred to R LQ abd pain into groin  Dr Rubin PayorPickering in to assess

## 2017-02-17 NOTE — ED Provider Notes (Signed)
[facility] EMERGENCY DEPARTMENT Provider Note   CSN: [id number] Arrival date & time: 02/17/17  1430     History   Chief Complaint Chief Complaint  Patient presents with  . Flank Pain    HPI Willie Morales is a 56 y.o. male.  HPI Patient presents with left flank pain.  Started in the left posterior lower rib area and has now moved down to the groin.  Comes and goes.  The pain is sharp.  No nausea vomiting diarrhea.  No dysuria.  States the pain feels a little like his previous kidney stones.  Last kidney stone several years ago.  He has had stents in the past.  No hematuria.  No diarrhea or constipation.  Pain is not worse with movements. Past Medical History:  Diagnosis Date  . Coronary artery disease   . Diabetes mellitus   . Dyslipidemia   . History of kidney stones   . Hypertension   . Paroxysmal atrial fibrillation (HCC) 12/2010  . Tobacco abuse     Patient Active Problem List   Diagnosis Date Noted  . Leukocytosis 12/10/2016  . Atherosclerosis of native arteries of the extremities with ulceration (HCC) 07/05/2016  . Paroxysmal atrial fibrillation (HCC)   . Coronary artery disease     Past Surgical History:  Procedure Laterality Date  . ABDOMINAL AORTOGRAM W/LOWER EXTREMITY N/A 07/05/2016   Procedure: Abdominal Aortogram w/Lower Extremity;  Surgeon: Fransisco Hertzhen, Brian L, MD;  [facility];  Service: Cardiovascular;  Laterality: N/A;  . BACK SURGERY  1991  . CARDIAC CATHETERIZATION  07/31/2007   This showed nonobstructive coronary artery disease   He had 30% lesion in the mid RCA, 30% lesion in the PDA, 45% lesion in the LAD and mid  LAD, and 30% lesion in the diagonal.  The left circumflex was normal.  The LV function was 60%.     Marland Kitchen. KIDNEY STONE SURGERY  2011  . LOWER EXTREMITY ANGIOGRAPHY N/A 12/06/2016   Procedure: Lower Extremity Angiography;  Surgeon: Fransisco Hertzhen, Brian L, MD;  [facility];  Service: Cardiovascular;  Laterality: N/A;  .  PERIPHERAL VASCULAR ATHERECTOMY Right 07/19/2016   Procedure: Peripheral Vascular Atherectomy;  Surgeon: Fransisco Hertzhen, Brian L, MD;  [facility];  Service: Cardiovascular;  Laterality: Right;  . PERIPHERAL VASCULAR BALLOON ANGIOPLASTY Left 07/05/2016   Procedure: Peripheral Vascular Balloon Angioplasty;  Surgeon: Fransisco Hertzhen, Brian L, MD;  [facility];  Service: Cardiovascular;  Laterality: Left;  SFA  . PERIPHERAL VASCULAR INTERVENTION  12/06/2016   Procedure: PERIPHERAL VASCULAR INTERVENTION;  Surgeon: Fransisco Hertzhen, Brian L, MD;  [facility];  Service: Cardiovascular;;  LEFT       Home Medications    Prior to Admission medications   Medication Sig Start Date End Date Taking? Authorizing Provider  apixaban (ELIQUIS) 5 MG TABS tablet Take 1 tablet (5 mg total) by mouth 2 (two) times daily. 03/26/16  Yes Jodelle GrossLawrence, Kathryn M, NP  atorvastatin (LIPITOR) 10 MG tablet Take 10 mg by mouth daily.  11/12/16  Yes [provider]  benazepril (LOTENSIN) 10 MG tablet Take 1 tablet (10 mg total) by mouth daily. 04/03/16  Yes Jodelle GrossLawrence, Kathryn M, NP  clopidogrel (PLAVIX) 75 MG tablet Take 1 tablet (75 mg total) by mouth daily. 07/05/16  Yes Fransisco Hertzhen, Brian L, MD  gabapentin (NEURONTIN) 300 MG capsule Take 1 capsule (300 mg total) by mouth 3 (three) times daily. Patient taking differently: Take 300 mg  by mouth daily.  08/17/16  Yes Fransisco Hertzhen, Brian L, MD  glimepiride (AMARYL) 4 MG tablet Take 1 tablet (4 mg total) by mouth daily with breakfast. 03/19/16  Yes Jodelle GrossLawrence, Kathryn M, NP  HYDROcodone-acetaminophen (NORCO/VICODIN) 5-325 MG tablet Take one-two tabs po q 4-6 hrs prn pain Patient taking differently: Take 1-2 tablets by mouth daily.  04/05/16  Yes Triplett, Tammy, PA-C  Levothyroxine Sodium 50 MCG CAPS Take 50 mcg by mouth daily before breakfast.    Yes [provider]  metoprolol tartrate (LOPRESSOR) 25 MG tablet Take 1 tablet (25 mg total) by mouth 2 (two) times  daily. Patient taking differently: Take 25 mg by mouth daily.  03/19/16  Yes Jodelle GrossLawrence, Kathryn M, NP  pregabalin (LYRICA) 50 MG capsule Take 1 capsule (50 mg total) by mouth 3 (three) times daily. 01/25/17  Yes Fransisco Hertzhen, Brian L, MD  sitaGLIPtin-metformin (JANUMET) 50-1000 MG tablet Take 1 tablet by mouth 2 (two) times daily.   Yes [provider]    Family History Family History  Problem Relation Age of Onset  . Coronary artery disease Father        CABG in early 1350's  . Heart attack Father   . Heart disease Father   . Cancer Mother   . Heart disease Mother   . Heart disease Brother   . Diabetes Maternal Grandmother   . SIDS Sister     Social History Social History   Tobacco Use  . Smoking status: Current Every Day Smoker    Packs/day: 1.00    Years: 35.00    Pack years: 35.00    Types: Cigarettes    Last attempt to quit: 12/18/2015    Years since quitting: 1.1  . Smokeless tobacco: Never Used  . Tobacco comment: quit "3 months ago"  Substance Use Topics  . Alcohol use: No  . Drug use: No     Allergies   Ciprofloxacin   Review of Systems Review of Systems  Constitutional: Negative for appetite change and fever.  HENT: Negative for congestion.   Respiratory: Negative for shortness of breath.   Cardiovascular: Negative for chest pain.  Gastrointestinal: Negative for abdominal pain.  Genitourinary: Positive for flank pain. Negative for hematuria and testicular pain.  Musculoskeletal: Negative for back pain.  Skin: Negative for rash.  Neurological: Negative for weakness and numbness.  Hematological: Negative for adenopathy.  Psychiatric/Behavioral: Negative for confusion.     Physical Exam Updated Vital Signs BP (!) 146/106   Pulse 67   Temp 97.7 F (36.5 C) (Oral)   Resp 18   Ht 6' (1.829 m)   Wt 88.9 kg (196 lb)   SpO2 93%   BMI 26.58 kg/m   Physical Exam  Constitutional: He appears well-developed.  HENT:  Head: Atraumatic.  Eyes: Pupils  are equal, round, and reactive to light.  Neck: Neck supple.  Cardiovascular: Normal rate.  Pulmonary/Chest: Effort normal.  Abdominal: Soft. There is no tenderness.  Genitourinary:  Genitourinary Comments: No CVA tenderness  Musculoskeletal: He exhibits no tenderness.  Neurological: He is alert.  Skin: Skin is warm. Capillary refill takes less than 2 seconds.  Psychiatric: He has a normal mood and affect.     ED Treatments / Results  Labs (all labs ordered are listed, but only abnormal results are displayed) Labs Reviewed  LIPASE, BLOOD - Abnormal; Notable for the following components:      Result Value   Lipase 102 (*)    All other components within normal  limits  COMPREHENSIVE METABOLIC PANEL - Abnormal; Notable for the following components:   Glucose, Bld 256 (*)    Total Protein 8.6 (*)    ALT 15 (*)    Total Bilirubin 0.1 (*)    All other components within normal limits  CBC - Abnormal; Notable for the following components:   WBC 11.6 (*)    All other components within normal limits  URINALYSIS, ROUTINE W REFLEX MICROSCOPIC - Abnormal; Notable for the following components:   Glucose, UA >=500 (*)    Squamous Epithelial / LPF 0-5 (*)    All other components within normal limits    EKG  EKG Interpretation None       Radiology Ct Renal Stone Study  Result Date: 02/17/2017 CLINICAL DATA:  Left flank pain for 2 days. EXAM: CT ABDOMEN AND PELVIS WITHOUT CONTRAST TECHNIQUE: Multidetector CT imaging of the abdomen and pelvis was performed following the standard protocol without IV contrast. COMPARISON:  None. FINDINGS: Lower chest: No acute abnormality. Hepatobiliary: No focal liver abnormality is seen. No gallstones, gallbladder wall thickening, or biliary dilatation. Pancreas: Unremarkable. No pancreatic ductal dilatation or surrounding inflammatory changes. Spleen: Normal in size without focal abnormality. Adrenals/Urinary Tract: Bilateral adrenal glands are normal.  Cysts are identified in bilateral kidneys. There are tiny nonobstructing stones in both kidneys. No hydronephrosis is identified bilaterally. Partial fluid-filled bladder is unremarkable. Stomach/Bowel: Stomach is within normal limits. No evidence of bowel wall thickening, distention, or inflammatory changes. The appendix is normal. Vascular/Lymphatic: Aortic atherosclerosis. No enlarged abdominal or pelvic lymph nodes. Reproductive: Prostate is unremarkable. Other: None. Musculoskeletal: Mild degenerative joint changes of the spine are noted. IMPRESSION: Bilateral nephrolithiasis. No hydronephrosis is identified bilaterally. Electronically Signed   By: Sherian Rein M.D.   On: 02/17/2017 17:36    Procedures Procedures (including critical care time)  Medications Ordered in ED Medications - No data to display   Initial Impression / Assessment and Plan / ED Course  I have reviewed the triage vital signs and the nursing notes.  Pertinent labs & imaging results that were available during my care of the patient were reviewed by me and considered in my medical decision making (see chart for details).     Patient with flank pain.  No real tenderness.  However patient states when he moves his arm up for the x-ray he had a little bit of pain right below the ribs.  Labs reassuring but lipase mildly elevated.  Discussed the results with the patient and he will follow-up with his primary doctor.  No rash seen for shingles.  Will discharge home with outpatient follow-up.  No clear cause found for the pain.  Final Clinical Impressions(s) / ED Diagnoses   Final diagnoses:  Left flank pain  Elevated lipase    ED Discharge Orders    None       Benjiman Core, MD 02/17/17 [phone]

## 2017-02-17 NOTE — Discharge Instructions (Signed)
Your lipase was mildly elevated.  Follow-up with Dr. Margo AyeHall for further evaluation of this.  Your CAT scan today did not show a cause for the pain or the lipase elevation.

## 2017-02-17 NOTE — ED Notes (Signed)
Dr P in to assess 

## 2017-02-17 NOTE — ED Notes (Signed)
From Rad 

## 2017-04-18 ENCOUNTER — Other Ambulatory Visit: Payer: Self-pay

## 2017-04-18 DIAGNOSIS — I7025 Atherosclerosis of native arteries of other extremities with ulceration: Secondary | ICD-10-CM

## 2017-04-18 DIAGNOSIS — I6523 Occlusion and stenosis of bilateral carotid arteries: Secondary | ICD-10-CM

## 2017-04-29 NOTE — H&P (View-Only) (Signed)
s3     History of Present Illness   Willie Morales is a 57 y.o. (07/13/1960) male who presents cc:  left leg pain.  Prior procedure include: 1. L SFA PTA+S (12/06/16) 2. OA+DCB PTA R fem-pop (07/19/16)for sub-total occlusion of R SFA and pop 3. L SFA SIA x 2 (07/05/16)for L SFA occlusion  The patient baseline has bilateral neuropathic pain in legs related to diabetes.  Over the last month, the patient had severe increase in L leg pain.  He has very short distance claudication and rest pain at this point.  The patient is NOT able to complete their activities of daily living.    Past Medical History:  Diagnosis Date  . Coronary artery disease   . Diabetes mellitus   . Dyslipidemia   . History of kidney stones   . Hypertension   . Paroxysmal atrial fibrillation (HCC) 12/2010  . Tobacco abuse     Past Surgical History:  Procedure Laterality Date  . ABDOMINAL AORTOGRAM W/LOWER EXTREMITY N/A 07/05/2016   Procedure: Abdominal Aortogram w/Lower Extremity;  Surgeon: Merlina Marchena L, MD;  Location: [facility];  Service: Cardiovascular;  Laterality: N/A;  . BACK SURGERY  1991  . CARDIAC CATHETERIZATION  07/31/2007   This showed nonobstructive coronary artery disease   He had 30% lesion in the mid RCA, 30% lesion in the PDA, 45% lesion in the LAD and mid  LAD, and 30% lesion in the diagonal.  The left circumflex was normal.  The LV function was 60%.     . KIDNEY STONE SURGERY  2011  . LOWER EXTREMITY ANGIOGRAPHY N/A 12/06/2016   Procedure: Lower Extremity Angiography;  Surgeon: Deniro Laymon L, MD;  Location: [facility];  Service: Cardiovascular;  Laterality: N/A;  . PERIPHERAL VASCULAR ATHERECTOMY Right 07/19/2016   Procedure: Peripheral Vascular Atherectomy;  Surgeon: Alabama Doig L, MD;  Location: [facility];  Service: Cardiovascular;  Laterality: Right;  . PERIPHERAL VASCULAR BALLOON ANGIOPLASTY Left 07/05/2016   Procedure: Peripheral Vascular Balloon  Angioplasty;  Surgeon: Chrishana Spargur L, MD;  Location: [facility];  Service: Cardiovascular;  Laterality: Left;  SFA  . PERIPHERAL VASCULAR INTERVENTION  12/06/2016   Procedure: PERIPHERAL VASCULAR INTERVENTION;  Surgeon: Laquinda Moller L, MD;  Location: [facility];  Service: Cardiovascular;;  LEFT    Social History   Socioeconomic History  . Marital status: Married    Spouse name: Not on file  . Number of children: Not on file  . Years of education: Not on file  . Highest education level: Not on file  Social Needs  . Financial resource strain: Not on file  . Food insecurity - worry: Not on file  . Food insecurity - inability: Not on file  . Transportation needs - medical: Not on file  . Transportation needs - non-medical: Not on file  Occupational History  . Occupation: plumber    Comment: by trade  Tobacco Use  . Smoking status: Current Every Day Smoker    Packs/day: 1.00    Years: 35.00    Pack years: 35.00    Types: Cigarettes    Last attempt to quit: 12/18/2015    Years since quitting: 1.3  . Smokeless tobacco: Never Used  . Tobacco comment: quit "3 months ago"  Substance and Sexual Activity  . Alcohol use: No  . Drug use: No  . Sexual activity: Not on file  Other Topics Concern  . Not on file    Social History Narrative   Has 2 children    Lives in [place] with wife   Currently unemployed    Family History  Problem Relation Age of Onset  . Coronary artery disease Father        CABG in early 50's  . Heart attack Father   . Heart disease Father   . Cancer Mother   . Heart disease Mother   . Heart disease Brother   . Diabetes Maternal Grandmother   . SIDS Sister     Current Outpatient Medications  Medication Sig Dispense Refill  . apixaban (ELIQUIS) 5 MG TABS tablet Take 1 tablet (5 mg total) by mouth 2 (two) times daily. 60 tablet 6  . atorvastatin (LIPITOR) 10 MG tablet Take 10 mg by mouth daily.   1  . benazepril (LOTENSIN) 10 MG tablet  Take 1 tablet (10 mg total) by mouth daily. 30 tablet 6  . clopidogrel (PLAVIX) 75 MG tablet Take 1 tablet (75 mg total) by mouth daily. 30 tablet 11  . gabapentin (NEURONTIN) 300 MG capsule Take 1 capsule (300 mg total) by mouth 3 (three) times daily. (Patient taking differently: Take 300 mg by mouth daily. ) 90 capsule 11  . glimepiride (AMARYL) 4 MG tablet Take 1 tablet (4 mg total) by mouth daily with breakfast. 30 tablet 6  . HYDROcodone-acetaminophen (NORCO/VICODIN) 5-325 MG tablet Take one-two tabs po q 4-6 hrs prn pain (Patient taking differently: Take 1-2 tablets by mouth daily. ) 15 tablet 0  . Levothyroxine Sodium 50 MCG CAPS Take 50 mcg by mouth daily before breakfast.     . metoprolol tartrate (LOPRESSOR) 25 MG tablet Take 1 tablet (25 mg total) by mouth 2 (two) times daily. (Patient taking differently: Take 25 mg by mouth daily. ) 60 tablet 6  . pregabalin (LYRICA) 50 MG capsule Take 1 capsule (50 mg total) by mouth 3 (three) times daily. 90 capsule 2  . sitaGLIPtin-metformin (JANUMET) 50-1000 MG tablet Take 1 tablet by mouth 2 (two) times daily.     No current facility-administered medications for this visit.      Allergies  Allergen Reactions  . Ciprofloxacin Rash    REVIEW OF SYSTEMS (negative unless checked):   Cardiac:  [] Chest pain or chest pressure? [] Shortness of breath upon activity? [] Shortness of breath when lying flat? [] Irregular heart rhythm?  Vascular:  [x] Pain in calf, thigh, or hip brought on by walking? [x] Pain in feet at night that wakes you up from your sleep? [] Blood clot in your veins? [] Leg swelling?  Pulmonary:  [] Oxygen at home? [] Productive cough? [] Wheezing?  Neurologic:  [] Sudden weakness in arms or legs? [] Sudden numbness in arms or legs? [] Sudden onset of difficult speaking or slurred speech? [] Temporary loss of vision in one eye? [] Problems with dizziness?  Gastrointestinal:  [] Blood in stool? [] Vomited  blood?  Genitourinary:  [] Burning when urinating? [] Blood in urine?  Psychiatric:  [] Major depression  Hematologic:  [] Bleeding problems? [] Problems with blood clotting?  Dermatologic:  [] Rashes or ulcers?  Constitutional:  [] Fever or chills?  Ear/Nose/Throat:  [] Change in hearing? [] Nose bleeds? [] Sore throat?  Musculoskeletal:  [] Back pain? [] Joint pain? [] Muscle pain?   Physical Examination   Vitals:   05/01/17 1051  BP: 121/84  Pulse: 76  Resp: 20  SpO2: 100%  Weight: 177 lb (  80.3 kg)  Height: 6' (1.829 m)   Body mass index is 24.01 kg/m.  General Alert, O x 3, WD, NAD  Pulmonary Sym exp, good B air movt, CTA B  Cardiac RRR, Nl S1, S2, no Murmurs, No rubs, No S3,S4  Vascular Vessel Right Left  Radial Palpable Palpable  Brachial Palpable Palpable  Carotid Palpable, No Bruit Palpable, No Bruit  Aorta Not palpable N/A  Femoral Palpable Palpable  Popliteal Not palpable Not palpable  PT Faintly palpable Faintly palpable  DP Faintly palpable Not palpable    Gastrointestinal soft, non-distended, non-tender to palpation, No guarding or rebound, no HSM, no masses, no CVAT B, No palpable prominent aortic pulse,    Musculoskeletal M/S 5/5 throughout  , Extremities without ischemic changes  , No edema present, some dependent rubor in L foot,   Neurologic  Pain and light touch intact in extremities , Motor exam as listed above   Non-invasive Vascular Imaging   ABI (05/01/2017)  R:   ABI: 0.77 (0.80),   PT: mono  DP: mono  TBI:  0.37  L:   ABI: 0.37 (0.76),   PT: mono  DP: mono  TBI: 0  BLE Arterial Duplex (05/01/2017) Right Duplex Findings:  PSV cm/s Ratio Stenosis Waveform Comments  CIA Distal 117      CFA Distal 117   biphasic    DFA 79   biphasic    SFA Prox 161  30-49% stenosis  biphasic    SFA Mid 108   biphasic    SFA Distal 123   biphasic    POP Prox 95   biphasic    ATA Distal 25   biphasic    PTA Distal 24    biphasic    PERO Distal 20   biphasic      Left Duplex Findings:  PSV cm/s Ratio Stenosis Waveform Comments  CFA Distal 186   biphasic    DFA 258  50-74% stenosis  biphasic    SFA Prox 16   biphasic    POP Prox 25   monophasic    POP Distal 48   monophasic    ATA Distal 13   monophasic    PTA Distal 15   monophasic    PERO Distal 18   monophasic    Left Stent(s): Superficial femoral artery  PSV cm/s Stenosis Waveform Comments  Prox to Stent 19  biphasic    Proximal Stent 39  monophasic    Mid Stent 0 occluded     Distal Stent 0 occluded     Distal to Stent 25  monophasic    No color flow or Doppler signals noted in the left mid-distal superficial femoral artery stent.    PSV cm/s Stenosis Waveform Comments  Prox to Stent      Proximal Stent      Mid Stent      Distal Stent      Distal to Stent        Final Interpretation:   Right: 30-49% stenosis noted in the superficial femoral artery.    Right Graft(s):     Left: 50-74% stenosis noted in the deep femoral artery. Total occlusion noted in the superficial femoral artery. Severe progression is noted compared to previous study.    Left Graft(s):  *See table(s) above for measurements and observations.    B carotid duplex (05/01/2017)  Right Carotid Findings:  PSV cm/s EDV cm/s Stenosis Describe Comments  CCA Prox 132 41  homogeneous      CCA Mid 86 24  homogeneous    CCA Distal 58 28  heterogenous    ICA Prox 149 67 60-79%  calcific    ICA Mid 112 52  homogeneous    ICA Distal 110 44     ECA 544 157 >50%  heterogenous      PSV cm/s EDV cms Describe Arm Pressure (mmHG)  Subclavian   Multiphasic, WNL     Vertebral PSV cm/s EDV cm/s Antegrade     Right Stent(s):  PSV cm/s EDV cm/s Stenosis Waveform Comments  Prox to Stent       Proximal Stent       Mid Stent       Distal Stent       Distal to Stent          PSV cm/s EDV cm/s Stenosis Waveform Comments  Prox to Stent       Proximal Stent       Mid Stent        Distal Stent       Distal to Stent         Right Graft:  PSV cm/s Stenosis Waveform Comments  Inflow      Prox anastomosis      Proximal graft      Mid graft      Distal graft      Distal anastomosis      Outflow      Left Carotid Findings:  PSV cm/s EDV cm/s Stenosis Describe Comments  CCA Prox 94 8     CCA Mid 44 11  homogeneous    CCA Distal 26 9  homogeneous    ICA Prox 0 0 Occluded  calcific    ICA Mid 0 0 Occluded     ICA Distal       ECA 328 42 >50%  heterogenous      Subclavian PSV cm/s EDV cm/s Describe Arm Pressure (mmHG)     Multiphasic, WNL     Vertebral PSV cm/s  EDV cm/s  Antegrade       Final Interpretation: Right Carotid: Velocities in the right ICA are consistent with a 60-79% stenosis. The ECA appears >50% stenosed.    Left Carotid: Velocities in the left ICA are consistent with a total occlusion. The ECA appears >50% stenosed.    Vertebrals: Bilateral vertebral arteries demonstrate antegrade flow.    Subclavians: Normal flow hemodynamics were seen in bilateral subclavian arteries.      Medical Decision Making   Willie Morales is a 57 y.o. (10/26/1960) male who presents s/p L SFA SIA x 2, PTA+S L SFAfor L foot ulcers, interval occlusion of L SFA stenting leading to rest pain, s/p R OA+DCB PTAfor sub-total occlusion of distal R SFA and AK pop, likely diabetic neuropathy, L ICA chronic occlusion, asx R ICA stenosis 60-79%   Pt now has critical limb ischemia due to left SFA stent occlusion.  After one month of occlusion, would not attempt salvage of the stents.    Would hold Eliquis and plan on Aortogram, left leg runoff on Monday 18 MAR 19.  I discussed with the patient the nature of angiographic procedures, especially the limited patencies of any endovascular intervention.    The patient is aware of that the risks of an angiographic procedure include but are not limited to: bleeding, infection, access site complications, renal failure,  embolization, rupture of vessel, dissection, arteriovenous fistula, possible need for emergent surgical intervention, possible need for surgical   procedures to treat the patient's pathology, anaphylactic reaction to contrast, and stroke and death.    The patient is aware of the risks and agrees to proceed.  Will plan on admitting post-procedure for expedited cardiac work-up and B GSV mapping.  Pt will likely need a L CFA to BK pop bypass, which might be scheduled for Thursday 21 MAR 19.  I discussed in depth with the patient the nature of atherosclerosis, and emphasized the importance of maximal medical management including strict control of blood pressure, blood glucose, and lipid levels, obtaining regular exercise, and cessation of smoking.  The patient is aware that without maximal medical management the underlying atherosclerotic disease process will progress, limiting the benefit of any interventions.  The patient is currently not on statin.  Will start while hospitalized.  The patient is currently on an anti-platelet: Plavix.  Thank you for allowing us to participate in this patient's care.   Leotha Westermeyer, MD, FACS [facility] Office: [phone] Pager: [phone]    

## 2017-04-29 NOTE — H&P (View-Only) (Signed)
s3     History of Present Illness   Willie Morales is a 57 y.o. (07/13/1960) male who presents cc:  left leg pain.  Prior procedure include: 1. L SFA PTA+S (12/06/16) 2. OA+DCB PTA R fem-pop (07/19/16)for sub-total occlusion of R SFA and pop 3. L SFA SIA x 2 (07/05/16)for L SFA occlusion  The patient baseline has bilateral neuropathic pain in legs related to diabetes.  Over the last month, the patient had severe increase in L leg pain.  He has very short distance claudication and rest pain at this point.  The patient is NOT able to complete their activities of daily living.    Past Medical History:  Diagnosis Date  . Coronary artery disease   . Diabetes mellitus   . Dyslipidemia   . History of kidney stones   . Hypertension   . Paroxysmal atrial fibrillation (HCC) 12/2010  . Tobacco abuse     Past Surgical History:  Procedure Laterality Date  . ABDOMINAL AORTOGRAM W/LOWER EXTREMITY N/A 07/05/2016   Procedure: Abdominal Aortogram w/Lower Extremity;  Surgeon: Chen, Brian L, MD;  Location: [facility];  Service: Cardiovascular;  Laterality: N/A;  . BACK SURGERY  1991  . CARDIAC CATHETERIZATION  07/31/2007   This showed nonobstructive coronary artery disease   He had 30% lesion in the mid RCA, 30% lesion in the PDA, 45% lesion in the LAD and mid  LAD, and 30% lesion in the diagonal.  The left circumflex was normal.  The LV function was 60%.     . KIDNEY STONE SURGERY  2011  . LOWER EXTREMITY ANGIOGRAPHY N/A 12/06/2016   Procedure: Lower Extremity Angiography;  Surgeon: Chen, Brian L, MD;  Location: [facility];  Service: Cardiovascular;  Laterality: N/A;  . PERIPHERAL VASCULAR ATHERECTOMY Right 07/19/2016   Procedure: Peripheral Vascular Atherectomy;  Surgeon: Chen, Brian L, MD;  Location: [facility];  Service: Cardiovascular;  Laterality: Right;  . PERIPHERAL VASCULAR BALLOON ANGIOPLASTY Left 07/05/2016   Procedure: Peripheral Vascular Balloon  Angioplasty;  Surgeon: Chen, Brian L, MD;  Location: [facility];  Service: Cardiovascular;  Laterality: Left;  SFA  . PERIPHERAL VASCULAR INTERVENTION  12/06/2016   Procedure: PERIPHERAL VASCULAR INTERVENTION;  Surgeon: Chen, Brian L, MD;  Location: [facility];  Service: Cardiovascular;;  LEFT    Social History   Socioeconomic History  . Marital status: Married    Spouse name: Not on file  . Number of children: Not on file  . Years of education: Not on file  . Highest education level: Not on file  Social Needs  . Financial resource strain: Not on file  . Food insecurity - worry: Not on file  . Food insecurity - inability: Not on file  . Transportation needs - medical: Not on file  . Transportation needs - non-medical: Not on file  Occupational History  . Occupation: plumber    Comment: by trade  Tobacco Use  . Smoking status: Current Every Day Smoker    Packs/day: 1.00    Years: 35.00    Pack years: 35.00    Types: Cigarettes    Last attempt to quit: 12/18/2015    Years since quitting: 1.3  . Smokeless tobacco: Never Used  . Tobacco comment: quit "3 months ago"  Substance and Sexual Activity  . Alcohol use: No  . Drug use: No  . Sexual activity: Not on file  Other Topics Concern  . Not on file    Social History Narrative   Has 2 children    Lives in [place] with wife   Currently unemployed    Family History  Problem Relation Age of Onset  . Coronary artery disease Father        CABG in early 50's  . Heart attack Father   . Heart disease Father   . Cancer Mother   . Heart disease Mother   . Heart disease Brother   . Diabetes Maternal Grandmother   . SIDS Sister     Current Outpatient Medications  Medication Sig Dispense Refill  . apixaban (ELIQUIS) 5 MG TABS tablet Take 1 tablet (5 mg total) by mouth 2 (two) times daily. 60 tablet 6  . atorvastatin (LIPITOR) 10 MG tablet Take 10 mg by mouth daily.   1  . benazepril (LOTENSIN) 10 MG tablet  Take 1 tablet (10 mg total) by mouth daily. 30 tablet 6  . clopidogrel (PLAVIX) 75 MG tablet Take 1 tablet (75 mg total) by mouth daily. 30 tablet 11  . gabapentin (NEURONTIN) 300 MG capsule Take 1 capsule (300 mg total) by mouth 3 (three) times daily. (Patient taking differently: Take 300 mg by mouth daily. ) 90 capsule 11  . glimepiride (AMARYL) 4 MG tablet Take 1 tablet (4 mg total) by mouth daily with breakfast. 30 tablet 6  . HYDROcodone-acetaminophen (NORCO/VICODIN) 5-325 MG tablet Take one-two tabs po q 4-6 hrs prn pain (Patient taking differently: Take 1-2 tablets by mouth daily. ) 15 tablet 0  . Levothyroxine Sodium 50 MCG CAPS Take 50 mcg by mouth daily before breakfast.     . metoprolol tartrate (LOPRESSOR) 25 MG tablet Take 1 tablet (25 mg total) by mouth 2 (two) times daily. (Patient taking differently: Take 25 mg by mouth daily. ) 60 tablet 6  . pregabalin (LYRICA) 50 MG capsule Take 1 capsule (50 mg total) by mouth 3 (three) times daily. 90 capsule 2  . sitaGLIPtin-metformin (JANUMET) 50-1000 MG tablet Take 1 tablet by mouth 2 (two) times daily.     No current facility-administered medications for this visit.      Allergies  Allergen Reactions  . Ciprofloxacin Rash    REVIEW OF SYSTEMS (negative unless checked):   Cardiac:  [] Chest pain or chest pressure? [] Shortness of breath upon activity? [] Shortness of breath when lying flat? [] Irregular heart rhythm?  Vascular:  [x] Pain in calf, thigh, or hip brought on by walking? [x] Pain in feet at night that wakes you up from your sleep? [] Blood clot in your veins? [] Leg swelling?  Pulmonary:  [] Oxygen at home? [] Productive cough? [] Wheezing?  Neurologic:  [] Sudden weakness in arms or legs? [] Sudden numbness in arms or legs? [] Sudden onset of difficult speaking or slurred speech? [] Temporary loss of vision in one eye? [] Problems with dizziness?  Gastrointestinal:  [] Blood in stool? [] Vomited  blood?  Genitourinary:  [] Burning when urinating? [] Blood in urine?  Psychiatric:  [] Major depression  Hematologic:  [] Bleeding problems? [] Problems with blood clotting?  Dermatologic:  [] Rashes or ulcers?  Constitutional:  [] Fever or chills?  Ear/Nose/Throat:  [] Change in hearing? [] Nose bleeds? [] Sore throat?  Musculoskeletal:  [] Back pain? [] Joint pain? [] Muscle pain?   Physical Examination   Vitals:   05/01/17 1051  BP: 121/84  Pulse: 76  Resp: 20  SpO2: 100%  Weight: 177 lb (  80.3 kg)  Height: 6' (1.829 m)   Body mass index is 24.01 kg/m.  General Alert, O x 3, WD, NAD  Pulmonary Sym exp, good B air movt, CTA B  Cardiac RRR, Nl S1, S2, no Murmurs, No rubs, No S3,S4  Vascular Vessel Right Left  Radial Palpable Palpable  Brachial Palpable Palpable  Carotid Palpable, No Bruit Palpable, No Bruit  Aorta Not palpable N/A  Femoral Palpable Palpable  Popliteal Not palpable Not palpable  PT Faintly palpable Faintly palpable  DP Faintly palpable Not palpable    Gastrointestinal soft, non-distended, non-tender to palpation, No guarding or rebound, no HSM, no masses, no CVAT B, No palpable prominent aortic pulse,    Musculoskeletal M/S 5/5 throughout  , Extremities without ischemic changes  , No edema present, some dependent rubor in L foot,   Neurologic  Pain and light touch intact in extremities , Motor exam as listed above   Non-invasive Vascular Imaging   ABI (05/01/2017)  R:   ABI: 0.77 (0.80),   PT: mono  DP: mono  TBI:  0.37  L:   ABI: 0.37 (0.76),   PT: mono  DP: mono  TBI: 0  BLE Arterial Duplex (05/01/2017) Right Duplex Findings:  PSV cm/s Ratio Stenosis Waveform Comments  CIA Distal 117      CFA Distal 117   biphasic    DFA 79   biphasic    SFA Prox 161  30-49% stenosis  biphasic    SFA Mid 108   biphasic    SFA Distal 123   biphasic    POP Prox 95   biphasic    ATA Distal 25   biphasic    PTA Distal 24    biphasic    PERO Distal 20   biphasic      Left Duplex Findings:  PSV cm/s Ratio Stenosis Waveform Comments  CFA Distal 186   biphasic    DFA 258  50-74% stenosis  biphasic    SFA Prox 16   biphasic    POP Prox 25   monophasic    POP Distal 48   monophasic    ATA Distal 13   monophasic    PTA Distal 15   monophasic    PERO Distal 18   monophasic    Left Stent(s): Superficial femoral artery  PSV cm/s Stenosis Waveform Comments  Prox to Stent 19  biphasic    Proximal Stent 39  monophasic    Mid Stent 0 occluded     Distal Stent 0 occluded     Distal to Stent 25  monophasic    No color flow or Doppler signals noted in the left mid-distal superficial femoral artery stent.    PSV cm/s Stenosis Waveform Comments  Prox to Stent      Proximal Stent      Mid Stent      Distal Stent      Distal to Stent        Final Interpretation:   Right: 30-49% stenosis noted in the superficial femoral artery.    Right Graft(s):     Left: 50-74% stenosis noted in the deep femoral artery. Total occlusion noted in the superficial femoral artery. Severe progression is noted compared to previous study.    Left Graft(s):  *See table(s) above for measurements and observations.    B carotid duplex (05/01/2017)  Right Carotid Findings:  PSV cm/s EDV cm/s Stenosis Describe Comments  CCA Prox 132 41  homogeneous      CCA Mid 86 24  homogeneous    CCA Distal 58 28  heterogenous    ICA Prox 149 67 60-79%  calcific    ICA Mid 112 52  homogeneous    ICA Distal 110 44     ECA 544 157 >50%  heterogenous      PSV cm/s EDV cms Describe Arm Pressure (mmHG)  Subclavian   Multiphasic, WNL     Vertebral PSV cm/s EDV cm/s Antegrade     Right Stent(s):  PSV cm/s EDV cm/s Stenosis Waveform Comments  Prox to Stent       Proximal Stent       Mid Stent       Distal Stent       Distal to Stent          PSV cm/s EDV cm/s Stenosis Waveform Comments  Prox to Stent       Proximal Stent       Mid Stent        Distal Stent       Distal to Stent         Right Graft:  PSV cm/s Stenosis Waveform Comments  Inflow      Prox anastomosis      Proximal graft      Mid graft      Distal graft      Distal anastomosis      Outflow      Left Carotid Findings:  PSV cm/s EDV cm/s Stenosis Describe Comments  CCA Prox 94 8     CCA Mid 44 11  homogeneous    CCA Distal 26 9  homogeneous    ICA Prox 0 0 Occluded  calcific    ICA Mid 0 0 Occluded     ICA Distal       ECA 328 42 >50%  heterogenous      Subclavian PSV cm/s EDV cm/s Describe Arm Pressure (mmHG)     Multiphasic, WNL     Vertebral PSV cm/s  EDV cm/s  Antegrade       Final Interpretation: Right Carotid: Velocities in the right ICA are consistent with a 60-79% stenosis. The ECA appears >50% stenosed.    Left Carotid: Velocities in the left ICA are consistent with a total occlusion. The ECA appears >50% stenosed.    Vertebrals: Bilateral vertebral arteries demonstrate antegrade flow.    Subclavians: Normal flow hemodynamics were seen in bilateral subclavian arteries.      Medical Decision Making   Taijuan A Glassner is a 57 y.o. (09/28/1960) male who presents s/p L SFA SIA x 2, PTA+S L SFAfor L foot ulcers, interval occlusion of L SFA stenting leading to rest pain, s/p R OA+DCB PTAfor sub-total occlusion of distal R SFA and AK pop, likely diabetic neuropathy, L ICA chronic occlusion, asx R ICA stenosis 60-79%   Pt now has critical limb ischemia due to left SFA stent occlusion.  After one month of occlusion, would not attempt salvage of the stents.    Would hold Eliquis and plan on Aortogram, left leg runoff on Monday 18 MAR 19.  I discussed with the patient the nature of angiographic procedures, especially the limited patencies of any endovascular intervention.    The patient is aware of that the risks of an angiographic procedure include but are not limited to: bleeding, infection, access site complications, renal failure,  embolization, rupture of vessel, dissection, arteriovenous fistula, possible need for emergent surgical intervention, possible need for surgical   procedures to treat the patient's pathology, anaphylactic reaction to contrast, and stroke and death.    The patient is aware of the risks and agrees to proceed.  Will plan on admitting post-procedure for expedited cardiac work-up and B GSV mapping.  Pt will likely need a L CFA to BK pop bypass, which might be scheduled for Thursday 21 MAR 19.  I discussed in depth with the patient the nature of atherosclerosis, and emphasized the importance of maximal medical management including strict control of blood pressure, blood glucose, and lipid levels, obtaining regular exercise, and cessation of smoking.  The patient is aware that without maximal medical management the underlying atherosclerotic disease process will progress, limiting the benefit of any interventions.  The patient is currently not on statin.  Will start while hospitalized.  The patient is currently on an anti-platelet: Plavix.  Thank you for allowing us to participate in this patient's care.   Brian Chen, MD, FACS [facility]  Office: [phone] Pager: [phone]    

## 2017-04-29 NOTE — Progress Notes (Signed)
s3     History of Present Illness   Willie Morales is a 57 y.o. (1960-03-05) male who presents cc:  left leg pain.  Prior procedure include: 1. L SFA PTA+S (12/06/16) 2. OA+DCB PTA R fem-pop (07/19/16)for sub-total occlusion of R SFA and pop 3. L SFA SIA x 2 (07/05/16)for L SFA occlusion  The patient baseline has bilateral neuropathic pain in legs related to diabetes.  Over the last month, the patient had severe increase in L leg pain.  He has very short distance claudication and rest pain at this point.  The patient is NOT able to complete their activities of daily living.    Past Medical History:  Diagnosis Date  . Coronary artery disease   . Diabetes mellitus   . Dyslipidemia   . History of kidney stones   . Hypertension   . Paroxysmal atrial fibrillation (HCC) 12/2010  . Tobacco abuse     Past Surgical History:  Procedure Laterality Date  . ABDOMINAL AORTOGRAM W/LOWER EXTREMITY N/A 07/05/2016   Procedure: Abdominal Aortogram w/Lower Extremity;  Surgeon: Fransisco Hertz, MD;  Location: [facility] INVASIVE CV LAB;  Service: Cardiovascular;  Laterality: N/A;  . BACK SURGERY  1991  . CARDIAC CATHETERIZATION  07/31/2007   This showed nonobstructive coronary artery disease   He had 30% lesion in the mid RCA, 30% lesion in the PDA, 45% lesion in the LAD and mid  LAD, and 30% lesion in the diagonal.  The left circumflex was normal.  The LV function was 60%.     Marland Kitchen KIDNEY STONE SURGERY  2011  . LOWER EXTREMITY ANGIOGRAPHY N/A 12/06/2016   Procedure: Lower Extremity Angiography;  Surgeon: Fransisco Hertz, MD;  Location: [facility] INVASIVE CV LAB;  Service: Cardiovascular;  Laterality: N/A;  . PERIPHERAL VASCULAR ATHERECTOMY Right 07/19/2016   Procedure: Peripheral Vascular Atherectomy;  Surgeon: Fransisco Hertz, MD;  Location: [facility] INVASIVE CV LAB;  Service: Cardiovascular;  Laterality: Right;  . PERIPHERAL VASCULAR BALLOON ANGIOPLASTY Left 07/05/2016   Procedure: Peripheral Vascular Balloon  Angioplasty;  Surgeon: Fransisco Hertz, MD;  Location: [facility] INVASIVE CV LAB;  Service: Cardiovascular;  Laterality: Left;  SFA  . PERIPHERAL VASCULAR INTERVENTION  12/06/2016   Procedure: PERIPHERAL VASCULAR INTERVENTION;  Surgeon: Fransisco Hertz, MD;  Location: [facility] INVASIVE CV LAB;  Service: Cardiovascular;;  LEFT    Social History   Socioeconomic History  . Marital status: Married    Spouse name: Not on file  . Number of children: Not on file  . Years of education: Not on file  . Highest education level: Not on file  Social Needs  . Financial resource strain: Not on file  . Food insecurity - worry: Not on file  . Food insecurity - inability: Not on file  . Transportation needs - medical: Not on file  . Transportation needs - non-medical: Not on file  Occupational History  . Occupation: Nutritional therapist    Comment: by trade  Tobacco Use  . Smoking status: Current Every Day Smoker    Packs/day: 1.00    Years: 35.00    Pack years: 35.00    Types: Cigarettes    Last attempt to quit: 12/18/2015    Years since quitting: 1.3  . Smokeless tobacco: Never Used  . Tobacco comment: quit "3 months ago"  Substance and Sexual Activity  . Alcohol use: No  . Drug use: No  . Sexual activity: Not on file  Other Topics Concern  . Not on file  Social History Narrative   Has 2 children    Lives in [place] with wife   Currently unemployed    Family History  Problem Relation Age of Onset  . Coronary artery disease Father        CABG in early 57's  . Heart attack Father   . Heart disease Father   . Cancer Mother   . Heart disease Mother   . Heart disease Brother   . Diabetes Maternal Grandmother   . SIDS Sister     Current Outpatient Medications  Medication Sig Dispense Refill  . apixaban (ELIQUIS) 5 MG TABS tablet Take 1 tablet (5 mg total) by mouth 2 (two) times daily. 60 tablet 6  . atorvastatin (LIPITOR) 10 MG tablet Take 10 mg by mouth daily.   1  . benazepril (LOTENSIN) 10 MG tablet  Take 1 tablet (10 mg total) by mouth daily. 30 tablet 6  . clopidogrel (PLAVIX) 75 MG tablet Take 1 tablet (75 mg total) by mouth daily. 30 tablet 11  . gabapentin (NEURONTIN) 300 MG capsule Take 1 capsule (300 mg total) by mouth 3 (three) times daily. (Patient taking differently: Take 300 mg by mouth daily. ) 90 capsule 11  . glimepiride (AMARYL) 4 MG tablet Take 1 tablet (4 mg total) by mouth daily with breakfast. 30 tablet 6  . HYDROcodone-acetaminophen (NORCO/VICODIN) 5-325 MG tablet Take one-two tabs po q 4-6 hrs prn pain (Patient taking differently: Take 1-2 tablets by mouth daily. ) 15 tablet 0  . Levothyroxine Sodium 50 MCG CAPS Take 50 mcg by mouth daily before breakfast.     . metoprolol tartrate (LOPRESSOR) 25 MG tablet Take 1 tablet (25 mg total) by mouth 2 (two) times daily. (Patient taking differently: Take 25 mg by mouth daily. ) 60 tablet 6  . pregabalin (LYRICA) 50 MG capsule Take 1 capsule (50 mg total) by mouth 3 (three) times daily. 90 capsule 2  . sitaGLIPtin-metformin (JANUMET) 50-1000 MG tablet Take 1 tablet by mouth 2 (two) times daily.     No current facility-administered medications for this visit.      Allergies  Allergen Reactions  . Ciprofloxacin Rash    REVIEW OF SYSTEMS (negative unless checked):   Cardiac:  []  Chest pain or chest pressure? []  Shortness of breath upon activity? []  Shortness of breath when lying flat? []  Irregular heart rhythm?  Vascular:  [x]  Pain in calf, thigh, or hip brought on by walking? [x]  Pain in feet at night that wakes you up from your sleep? []  Blood clot in your veins? []  Leg swelling?  Pulmonary:  []  Oxygen at home? []  Productive cough? []  Wheezing?  Neurologic:  []  Sudden weakness in arms or legs? []  Sudden numbness in arms or legs? []  Sudden onset of difficult speaking or slurred speech? []  Temporary loss of vision in one eye? []  Problems with dizziness?  Gastrointestinal:  []  Blood in stool? []  Vomited  blood?  Genitourinary:  []  Burning when urinating? []  Blood in urine?  Psychiatric:  []  Major depression  Hematologic:  []  Bleeding problems? []  Problems with blood clotting?  Dermatologic:  []  Rashes or ulcers?  Constitutional:  []  Fever or chills?  Ear/Nose/Throat:  []  Change in hearing? []  Nose bleeds? []  Sore throat?  Musculoskeletal:  []  Back pain? []  Joint pain? []  Muscle pain?   Physical Examination   Vitals:   05/01/17 1051  BP: 121/84  Pulse: 76  Resp: 20  SpO2: 100%  Weight: 177 lb (  80.3 kg)  Height: 6' (1.829 m)   Body mass index is 24.01 kg/m.  General Alert, O x 3, WD, NAD  Pulmonary Sym exp, good B air movt, CTA B  Cardiac RRR, Nl S1, S2, no Murmurs, No rubs, No S3,S4  Vascular Vessel Right Left  Radial Palpable Palpable  Brachial Palpable Palpable  Carotid Palpable, No Bruit Palpable, No Bruit  Aorta Not palpable N/A  Femoral Palpable Palpable  Popliteal Not palpable Not palpable  PT Faintly palpable Faintly palpable  DP Faintly palpable Not palpable    Gastrointestinal soft, non-distended, non-tender to palpation, No guarding or rebound, no HSM, no masses, no CVAT B, No palpable prominent aortic pulse,    Musculoskeletal M/S 5/5 throughout  , Extremities without ischemic changes  , No edema present, some dependent rubor in L foot,   Neurologic  Pain and light touch intact in extremities , Motor exam as listed above   Non-invasive Vascular Imaging   ABI (05/01/2017)  R:   ABI: 0.77 (0.80),   PT: mono  DP: mono  TBI:  0.37  L:   ABI: 0.37 (0.76),   PT: mono  DP: mono  TBI: 0  BLE Arterial Duplex (05/01/2017) Right Duplex Findings:  PSV cm/s Ratio Stenosis Waveform Comments  CIA Distal 117      CFA Distal 117   biphasic    DFA 79   biphasic    SFA Prox 161  30-49% stenosis  biphasic    SFA Mid 108   biphasic    SFA Distal 123   biphasic    POP Prox 95   biphasic    ATA Distal 25   biphasic    PTA Distal 24    biphasic    PERO Distal 20   biphasic      Left Duplex Findings:  PSV cm/s Ratio Stenosis Waveform Comments  CFA Distal 186   biphasic    DFA 258  50-74% stenosis  biphasic    SFA Prox 16   biphasic    POP Prox 25   monophasic    POP Distal 48   monophasic    ATA Distal 13   monophasic    PTA Distal 15   monophasic    PERO Distal 18   monophasic    Left Stent(s): Superficial femoral artery  PSV cm/s Stenosis Waveform Comments  Prox to Stent 19  biphasic    Proximal Stent 39  monophasic    Mid Stent 0 occluded     Distal Stent 0 occluded     Distal to Stent 25  monophasic    No color flow or Doppler signals noted in the left mid-distal superficial femoral artery stent.    PSV cm/s Stenosis Waveform Comments  Prox to Stent      Proximal Stent      Mid Stent      Distal Stent      Distal to Stent        Final Interpretation:   Right: 30-49% stenosis noted in the superficial femoral artery.    Right Graft(s):     Left: 50-74% stenosis noted in the deep femoral artery. Total occlusion noted in the superficial femoral artery. Severe progression is noted compared to previous study.    Left Graft(s):  *See table(s) above for measurements and observations.    B carotid duplex (05/01/2017)  Right Carotid Findings:  PSV cm/s EDV cm/s Stenosis Describe Comments  CCA Prox 132 41  homogeneous  CCA Mid 86 24  homogeneous    CCA Distal 58 28  heterogenous    ICA Prox 149 67 60-79%  calcific    ICA Mid 112 52  homogeneous    ICA Distal 110 44     ECA 544 157 >50%  heterogenous      PSV cm/s EDV cms Describe Arm Pressure (mmHG)  Subclavian   Multiphasic, WNL     Vertebral PSV cm/s EDV cm/s Antegrade     Right Stent(s):  PSV cm/s EDV cm/s Stenosis Waveform Comments  Prox to Stent       Proximal Stent       Mid Stent       Distal Stent       Distal to Stent          PSV cm/s EDV cm/s Stenosis Waveform Comments  Prox to Stent       Proximal Stent       Mid Stent        Distal Stent       Distal to Stent         Right Graft:  PSV cm/s Stenosis Waveform Comments  Inflow      Prox anastomosis      Proximal graft      Mid graft      Distal graft      Distal anastomosis      Outflow      Left Carotid Findings:  PSV cm/s EDV cm/s Stenosis Describe Comments  CCA Prox 94 8     CCA Mid 44 11  homogeneous    CCA Distal 26 9  homogeneous    ICA Prox 0 0 Occluded  calcific    ICA Mid 0 0 Occluded     ICA Distal       ECA 328 42 >50%  heterogenous      Subclavian PSV cm/s EDV cm/s Describe Arm Pressure (mmHG)     Multiphasic, WNL     Vertebral PSV cm/s  EDV cm/s  Antegrade       Final Interpretation: Right Carotid: Velocities in the right ICA are consistent with a 60-79% stenosis. The ECA appears >50% stenosed.    Left Carotid: Velocities in the left ICA are consistent with a total occlusion. The ECA appears >50% stenosed.    Vertebrals: Bilateral vertebral arteries demonstrate antegrade flow.    Subclavians: Normal flow hemodynamics were seen in bilateral subclavian arteries.      Medical Decision Making   Willie Morales is a 57 y.o. (05-21-1960) male who presents s/p L SFA SIA x 2, PTA+S L SFAfor L foot ulcers, interval occlusion of L SFA stenting leading to rest pain, s/p R OA+DCB PTAfor sub-total occlusion of distal R SFA and AK pop, likely diabetic neuropathy, L ICA chronic occlusion, asx R ICA stenosis 60-79%   Pt now has critical limb ischemia due to left SFA stent occlusion.  After one month of occlusion, would not attempt salvage of the stents.    Would hold Eliquis and plan on Aortogram, left leg runoff on Monday 18 MAR 19.  I discussed with the patient the nature of angiographic procedures, especially the limited patencies of any endovascular intervention.    The patient is aware of that the risks of an angiographic procedure include but are not limited to: bleeding, infection, access site complications, renal failure,  embolization, rupture of vessel, dissection, arteriovenous fistula, possible need for emergent surgical intervention, possible need for surgical  procedures to treat the patient's pathology, anaphylactic reaction to contrast, and stroke and death.    The patient is aware of the risks and agrees to proceed.  Will plan on admitting post-procedure for expedited cardiac work-up and B GSV mapping.  Pt will likely need a L CFA to BK pop bypass, which might be scheduled for Thursday 21 MAR 19.  I discussed in depth with the patient the nature of atherosclerosis, and emphasized the importance of maximal medical management including strict control of blood pressure, blood glucose, and lipid levels, obtaining regular exercise, and cessation of smoking.  The patient is aware that without maximal medical management the underlying atherosclerotic disease process will progress, limiting the benefit of any interventions.  The patient is currently not on statin.  Will start while hospitalized.  The patient is currently on an anti-platelet: Plavix.  Thank you for allowing [place] to participate in this patient's care.   Leonides Sake, MD, FACS [facility] Office: [phone] Pager: [phone]

## 2017-05-01 ENCOUNTER — Ambulatory Visit (INDEPENDENT_AMBULATORY_CARE_PROVIDER_SITE_OTHER)
Admission: RE | Admit: 2017-05-01 | Discharge: 2017-05-01 | Disposition: A | Discharge status: Home / Self Care | Payer: BLUE CROSS/BLUE SHIELD | Source: Ambulatory Visit | Attending: Vascular Surgery | Admitting: Vascular Surgery

## 2017-05-01 ENCOUNTER — Encounter: Payer: Self-pay | Admitting: *Deleted

## 2017-05-01 ENCOUNTER — Other Ambulatory Visit: Payer: Self-pay

## 2017-05-01 ENCOUNTER — Ambulatory Visit (INDEPENDENT_AMBULATORY_CARE_PROVIDER_SITE_OTHER): Payer: BLUE CROSS/BLUE SHIELD | Admitting: Vascular Surgery

## 2017-05-01 ENCOUNTER — Encounter: Payer: Self-pay | Admitting: Vascular Surgery

## 2017-05-01 ENCOUNTER — Other Ambulatory Visit: Payer: Self-pay | Admitting: *Deleted

## 2017-05-01 ENCOUNTER — Ambulatory Visit (HOSPITAL_COMMUNITY)
Admission: RE | Admit: 2017-05-01 | Discharge: 2017-05-01 | Disposition: A | Discharge status: Home / Self Care | Payer: BLUE CROSS/BLUE SHIELD | Source: Ambulatory Visit | Attending: Vascular Surgery | Admitting: Vascular Surgery

## 2017-05-01 VITALS — BP 121/84 | HR 76 | Resp 20 | Ht 72.0 in | Wt 177.0 lb

## 2017-05-01 DIAGNOSIS — I70203 Unspecified atherosclerosis of native arteries of extremities, bilateral legs: Secondary | ICD-10-CM | POA: Diagnosis not present

## 2017-05-01 DIAGNOSIS — I70222 Atherosclerosis of native arteries of extremities with rest pain, left leg: Secondary | ICD-10-CM | POA: Diagnosis not present

## 2017-05-01 DIAGNOSIS — I6523 Occlusion and stenosis of bilateral carotid arteries: Secondary | ICD-10-CM

## 2017-05-01 DIAGNOSIS — I7025 Atherosclerosis of native arteries of other extremities with ulceration: Secondary | ICD-10-CM

## 2017-05-03 ENCOUNTER — Encounter: Payer: Self-pay | Admitting: *Deleted

## 2017-05-03 ENCOUNTER — Telehealth: Payer: Self-pay | Admitting: *Deleted

## 2017-05-03 ENCOUNTER — Encounter: Payer: Self-pay | Admitting: Vascular Surgery

## 2017-05-03 NOTE — Telephone Encounter (Signed)
Patient instructed of time change for 05/06/17 procedure. To [facility] admitting at 11:30 am. Instructed may have clear liquids only from MN to 7:30 am then NPO on 05/06/17. Verbalized understanding.

## 2017-05-03 NOTE — Progress Notes (Signed)
Medication clearance form faxed to Dr. Kathleene HazelJohn Z. Hall on 05/01/17. Requested prompt reply for Holding Eliquis for procedure.

## 2017-05-03 NOTE — Telephone Encounter (Signed)
Procedure time change to arrive at hospital at 10 am for 12 noon procedure. NPO past MN night prior except for medications he was instructed to take(per letter) am of with sips of water only. Verbalized understanding and had patient write them down.

## 2017-05-06 ENCOUNTER — Ambulatory Visit (HOSPITAL_COMMUNITY)
Admission: RE | Disposition: A | Discharge status: Home / Self Care | Payer: Self-pay | Source: Ambulatory Visit | Attending: Vascular Surgery

## 2017-05-06 ENCOUNTER — Observation Stay (HOSPITAL_COMMUNITY)
Admission: RE | Admit: 2017-05-06 | Discharge: 2017-05-07 | Disposition: A | Discharge status: Home / Self Care | Payer: BLUE CROSS/BLUE SHIELD | Source: Ambulatory Visit | Attending: Vascular Surgery | Admitting: Vascular Surgery

## 2017-05-06 DIAGNOSIS — E1165 Type 2 diabetes mellitus with hyperglycemia: Secondary | ICD-10-CM | POA: Diagnosis not present

## 2017-05-06 DIAGNOSIS — Z7984 Long term (current) use of oral hypoglycemic drugs: Secondary | ICD-10-CM | POA: Diagnosis not present

## 2017-05-06 DIAGNOSIS — Z8249 Family history of ischemic heart disease and other diseases of the circulatory system: Secondary | ICD-10-CM | POA: Insufficient documentation

## 2017-05-06 DIAGNOSIS — E1151 Type 2 diabetes mellitus with diabetic peripheral angiopathy without gangrene: Secondary | ICD-10-CM | POA: Diagnosis present

## 2017-05-06 DIAGNOSIS — Z7901 Long term (current) use of anticoagulants: Secondary | ICD-10-CM | POA: Insufficient documentation

## 2017-05-06 DIAGNOSIS — I2510 Atherosclerotic heart disease of native coronary artery without angina pectoris: Secondary | ICD-10-CM | POA: Insufficient documentation

## 2017-05-06 DIAGNOSIS — T82856A Stenosis of peripheral vascular stent, initial encounter: Secondary | ICD-10-CM | POA: Diagnosis not present

## 2017-05-06 DIAGNOSIS — F17210 Nicotine dependence, cigarettes, uncomplicated: Secondary | ICD-10-CM | POA: Diagnosis not present

## 2017-05-06 DIAGNOSIS — I70229 Atherosclerosis of native arteries of extremities with rest pain, unspecified extremity: Secondary | ICD-10-CM | POA: Diagnosis not present

## 2017-05-06 DIAGNOSIS — E785 Hyperlipidemia, unspecified: Secondary | ICD-10-CM | POA: Diagnosis not present

## 2017-05-06 DIAGNOSIS — I70222 Atherosclerosis of native arteries of extremities with rest pain, left leg: Secondary | ICD-10-CM | POA: Diagnosis not present

## 2017-05-06 DIAGNOSIS — I252 Old myocardial infarction: Secondary | ICD-10-CM | POA: Diagnosis not present

## 2017-05-06 DIAGNOSIS — I10 Essential (primary) hypertension: Secondary | ICD-10-CM | POA: Insufficient documentation

## 2017-05-06 DIAGNOSIS — Z01810 Encounter for preprocedural cardiovascular examination: Secondary | ICD-10-CM

## 2017-05-06 DIAGNOSIS — Z7902 Long term (current) use of antithrombotics/antiplatelets: Secondary | ICD-10-CM | POA: Diagnosis not present

## 2017-05-06 DIAGNOSIS — I480 Paroxysmal atrial fibrillation: Secondary | ICD-10-CM | POA: Insufficient documentation

## 2017-05-06 DIAGNOSIS — E1142 Type 2 diabetes mellitus with diabetic polyneuropathy: Secondary | ICD-10-CM | POA: Diagnosis not present

## 2017-05-06 DIAGNOSIS — Y831 Surgical operation with implant of artificial internal device as the cause of abnormal reaction of the patient, or of later complication, without mention of misadventure at the time of the procedure: Secondary | ICD-10-CM | POA: Diagnosis not present

## 2017-05-06 DIAGNOSIS — I70212 Atherosclerosis of native arteries of extremities with intermittent claudication, left leg: Secondary | ICD-10-CM | POA: Diagnosis not present

## 2017-05-06 HISTORY — PX: ABDOMINAL AORTOGRAM W/LOWER EXTREMITY: CATH118223

## 2017-05-06 LAB — POCT I-STAT, CHEM 8
BUN: 15 mg/dL (ref 6–20)
Calcium, Ion: 1.25 mmol/L (ref 1.15–1.40)
Chloride: 103 mmol/L (ref 101–111)
Creatinine, Ser: 0.9 mg/dL (ref 0.61–1.24)
Glucose, Bld: 184 mg/dL — ABNORMAL HIGH (ref 65–99)
HCT: 49 % (ref 39.0–52.0)
Hemoglobin: 16.7 g/dL (ref 13.0–17.0)
Potassium: 4.8 mmol/L (ref 3.5–5.1)
Sodium: 138 mmol/L (ref 135–145)
TCO2: 26 mmol/L (ref 22–32)

## 2017-05-06 LAB — CREATININE, SERUM
Creatinine, Ser: 0.91 mg/dL (ref 0.61–1.24)
GFR calc Af Amer: >60 mL/min (ref >60)
GFR calc non Af Amer: >60 mL/min (ref >60)

## 2017-05-06 LAB — CBC
HCT: 48 % (ref 39.0–52.0)
Hemoglobin: 16.1 g/dL (ref 13.0–17.0)
MCH: 30 pg (ref 26.0–34.0)
MCHC: 33.5 g/dL (ref 30.0–36.0)
MCV: 89.4 fL (ref 78.0–100.0)
Platelets: 200 10*3/uL (ref 150–400)
RBC: 5.37 MIL/uL (ref 4.22–5.81)
RDW: 14 % (ref 11.5–15.5)
WBC: 17.6 10*3/uL — ABNORMAL HIGH (ref 4.0–10.5)

## 2017-05-06 LAB — GLUCOSE, CAPILLARY: Glucose-Capillary: 117 mg/dL — ABNORMAL HIGH (ref 65–99)

## 2017-05-06 SURGERY — ABDOMINAL AORTOGRAM W/LOWER EXTREMITY
Anesthesia: LOCAL

## 2017-05-06 MED ORDER — SODIUM CHLORIDE 0.9 % WEIGHT BASED INFUSION
1.0000 mL/kg/h | INTRAVENOUS | Status: AC
  Administered 2017-05-06: 1 mL/kg/h via INTRAVENOUS

## 2017-05-06 MED ORDER — METOPROLOL TARTRATE 25 MG PO TABS
25.0000 mg | ORAL_TABLET | Freq: Two times a day (BID) | ORAL | Status: DC
  Administered 2017-05-06 – 2017-05-07 (×2): 25 mg via ORAL
  Filled 2017-05-06 (×2): qty 1

## 2017-05-06 MED ORDER — OXYCODONE HCL 5 MG PO TABS: 5.0000 mg | ORAL_TABLET | ORAL | Status: DC | PRN

## 2017-05-06 MED ORDER — MIDAZOLAM HCL 2 MG/2ML IJ SOLN
INTRAMUSCULAR | Status: AC
  Filled 2017-05-06: qty 2

## 2017-05-06 MED ORDER — FENTANYL CITRATE (PF) 100 MCG/2ML IJ SOLN
INTRAMUSCULAR | Status: DC | PRN
  Administered 2017-05-06: 25 ug via INTRAVENOUS

## 2017-05-06 MED ORDER — NICOTINE 14 MG/24HR TD PT24
14.0000 mg | MEDICATED_PATCH | Freq: Every day | TRANSDERMAL | Status: DC
  Filled 2017-05-06: qty 1

## 2017-05-06 MED ORDER — SODIUM CHLORIDE 0.9 % IV SOLN
INTRAVENOUS | Status: DC
  Administered 2017-05-06: 11:00:00 via INTRAVENOUS

## 2017-05-06 MED ORDER — SODIUM CHLORIDE 0.9% FLUSH: 3.0000 mL | Freq: Two times a day (BID) | INTRAVENOUS | Status: DC

## 2017-05-06 MED ORDER — IODIXANOL 320 MG/ML IV SOLN
INTRAVENOUS | Status: DC | PRN
  Administered 2017-05-06: 97 mL via INTRA_ARTERIAL

## 2017-05-06 MED ORDER — HEPARIN (PORCINE) IN NACL 2-0.9 UNIT/ML-% IJ SOLN
INTRAMUSCULAR | Status: AC | PRN
  Administered 2017-05-06: 1000 mL via INTRA_ARTERIAL

## 2017-05-06 MED ORDER — BENAZEPRIL HCL 10 MG PO TABS
10.0000 mg | ORAL_TABLET | Freq: Every day | ORAL | Status: DC
  Administered 2017-05-07: 10 mg via ORAL
  Filled 2017-05-06: qty 1

## 2017-05-06 MED ORDER — MIDAZOLAM HCL 2 MG/2ML IJ SOLN
INTRAMUSCULAR | Status: DC | PRN
  Administered 2017-05-06: 2 mg via INTRAVENOUS

## 2017-05-06 MED ORDER — CLOPIDOGREL BISULFATE 75 MG PO TABS
75.0000 mg | ORAL_TABLET | Freq: Every day | ORAL | Status: DC
  Administered 2017-05-06 – 2017-05-07 (×2): 75 mg via ORAL
  Filled 2017-05-06 (×2): qty 1

## 2017-05-06 MED ORDER — HEPARIN SODIUM (PORCINE) 5000 UNIT/ML IJ SOLN
5000.0000 [IU] | Freq: Three times a day (TID) | INTRAMUSCULAR | Status: DC
  Administered 2017-05-06: 5000 [IU] via SUBCUTANEOUS
  Filled 2017-05-06 (×2): qty 1

## 2017-05-06 MED ORDER — SODIUM CHLORIDE 0.9 % IV SOLN: 250.0000 mL | INTRAVENOUS | Status: DC | PRN

## 2017-05-06 MED ORDER — ACETAMINOPHEN 325 MG PO TABS: 650.0000 mg | ORAL_TABLET | ORAL | Status: DC | PRN

## 2017-05-06 MED ORDER — HEPARIN (PORCINE) IN NACL 2-0.9 UNIT/ML-% IJ SOLN
INTRAMUSCULAR | Status: AC
  Filled 2017-05-06: qty 1000

## 2017-05-06 MED ORDER — GABAPENTIN 300 MG PO CAPS
300.0000 mg | ORAL_CAPSULE | Freq: Three times a day (TID) | ORAL | Status: DC
  Administered 2017-05-06 – 2017-05-07 (×3): 300 mg via ORAL
  Filled 2017-05-06 (×3): qty 1

## 2017-05-06 MED ORDER — LIDOCAINE HCL (PF) 1 % IJ SOLN
INTRAMUSCULAR | Status: DC | PRN
  Administered 2017-05-06: 17 mL

## 2017-05-06 MED ORDER — LABETALOL HCL 5 MG/ML IV SOLN: 10.0000 mg | INTRAVENOUS | Status: DC | PRN

## 2017-05-06 MED ORDER — ATORVASTATIN CALCIUM 10 MG PO TABS
10.0000 mg | ORAL_TABLET | Freq: Every day | ORAL | Status: DC
  Administered 2017-05-06 – 2017-05-07 (×2): 10 mg via ORAL
  Filled 2017-05-06 (×2): qty 1

## 2017-05-06 MED ORDER — HYDROMORPHONE HCL 1 MG/ML IJ SOLN: 0.5000 mg | INTRAMUSCULAR | Status: DC | PRN

## 2017-05-06 MED ORDER — LIDOCAINE HCL 1 % IJ SOLN
INTRAMUSCULAR | Status: AC
  Filled 2017-05-06: qty 20

## 2017-05-06 MED ORDER — FENTANYL CITRATE (PF) 100 MCG/2ML IJ SOLN
INTRAMUSCULAR | Status: AC
  Filled 2017-05-06: qty 2

## 2017-05-06 MED ORDER — LEVOTHYROXINE SODIUM 25 MCG PO TABS
25.0000 ug | ORAL_TABLET | Freq: Every day | ORAL | Status: DC
  Administered 2017-05-07: 25 ug via ORAL
  Filled 2017-05-06: qty 1

## 2017-05-06 MED ORDER — SODIUM CHLORIDE 0.9% FLUSH: 3.0000 mL | INTRAVENOUS | Status: DC | PRN

## 2017-05-06 MED ORDER — HYDRALAZINE HCL 20 MG/ML IJ SOLN
5.0000 mg | INTRAMUSCULAR | Status: DC | PRN
  Administered 2017-05-06: 5 mg via INTRAVENOUS
  Filled 2017-05-06: qty 1

## 2017-05-06 MED ORDER — HYDROCODONE-ACETAMINOPHEN 5-325 MG PO TABS: 1.0000 | ORAL_TABLET | Freq: Four times a day (QID) | ORAL | Status: DC | PRN

## 2017-05-06 MED ORDER — ONDANSETRON HCL 4 MG/2ML IJ SOLN: 4.0000 mg | Freq: Four times a day (QID) | INTRAMUSCULAR | Status: DC | PRN

## 2017-05-06 SURGICAL SUPPLY — 10 items
CATH OMNI FLUSH 5F 65CM (CATHETERS) ×2 IMPLANT
COVER PRB 48X5XTLSCP FOLD TPE (BAG) ×1 IMPLANT
COVER PROBE 5X48 (BAG) ×1
KIT MICROPUNCTURE NIT STIFF (SHEATH) ×2 IMPLANT
KIT PV (KITS) ×2 IMPLANT
SHEATH AVANTI 11CM 5FR (SHEATH) ×2 IMPLANT
SYR MEDRAD MARK V 150ML (SYRINGE) ×2 IMPLANT
TRANSDUCER W/STOPCOCK (MISCELLANEOUS) ×2 IMPLANT
TRAY PV CATH (CUSTOM PROCEDURE TRAY) ×2 IMPLANT
WIRE BENTSON .035X145CM (WIRE) ×2 IMPLANT

## 2017-05-06 NOTE — Progress Notes (Signed)
  Progress Note    05/06/2017 2:24 PM Day of Surgery  Subjective:  Has left leg claudication  Vitals:   05/06/17 1002  BP: 132/70  Pulse: (!) 53  Resp: 18  Temp: (!) 97.2 F (36.2 C)  SpO2: 99%    Physical Exam: aaox3 Non labored respirations Left leg without wounds  CBC    Component Value Date/Time   WBC 11.6 (H) 02/17/2017 1646   RBC 4.91 02/17/2017 1646   HGB 16.7 05/06/2017 1054   HCT 49.0 05/06/2017 1054   PLT 203 02/17/2017 1646   MCV 90.8 02/17/2017 1646   MCH 29.1 02/17/2017 1646   MCHC 32.1 02/17/2017 1646   RDW 14.7 02/17/2017 1646   LYMPHSABS 2.9 12/10/2016 1332   MONOABS 1.3 (H) 12/10/2016 1332   EOSABS 0.4 12/10/2016 1332   BASOSABS 0.1 12/10/2016 1332    BMET    Component Value Date/Time   NA 138 05/06/2017 1054   K 4.8 05/06/2017 1054   CL 103 05/06/2017 1054   CO2 22 02/17/2017 1646   GLUCOSE 184 (H) 05/06/2017 1054   BUN 15 05/06/2017 1054   CREATININE 0.90 05/06/2017 1054   CALCIUM 9.7 02/17/2017 1646   GFRNONAA >60 02/17/2017 1646   GFRAA >60 02/17/2017 1646    INR    Component Value Date/Time   INR 1.0 07/30/2007 1820    No intake or output data in the 24 hours ending 05/06/17 1424   Assessment:  57 y.o. male is here with occluded sfa stents  Plan: Angiogram today for diagnostic purposes to plan left leg bypass   Brandon C. Randie Heinzain, MD [facility] [place] Office: [phone] Pager: [phone]  05/06/2017 2:24 PM

## 2017-05-06 NOTE — Progress Notes (Signed)
Site area: rt groin fa sheath Site Prior to Removal:  Level 0 Pressure Applied For:20 minutes Manual:   yes Patient Status During Pull:  stable Post Pull Site:  Level  0 Post Pull Instructions Given:  yes Post Pull Pulses Present: dopplered rt pt Dressing Applied:  Gauze and tegaderm Bedrest begins @ 1600 Comments:

## 2017-05-06 NOTE — Progress Notes (Signed)
   Daily Progress Note  Due to changes in PV schedule, will have to have Dr. Randie Heinzain do the diagnostic Ao and L leg runoff.  Will admit patient post-op for preop risk stratification and optimization for Thurs L fem-pop BPG.   Leonides SakeBrian Laquilla Dault, MD, FACS [facility] [place] Office: [phone] Pager: [phone]  05/06/2017, 11:58 AM

## 2017-05-06 NOTE — Progress Notes (Signed)
05/06/2017 1615 Received pt to room 4E12 from Cath Lab.  Pt is A&O, no C/O voiced.  Tele monitor applied and CCMD notified.  Oriented to room, call light and bed.  Pt advised on BR until 8PM. Kathryne HitchAllen, Jahaad Penado C

## 2017-05-06 NOTE — Op Note (Signed)
    Patient name: Willie Morales MRN: [id number] DOB: 11/23/1960 Sex: male  05/06/2017 Pre-operative Diagnosis: left lower extremity claudication Post-operative diagnosis:  Same Surgeon:  Apolinar JunesBrandon C. Randie Heinzain, MD Procedure Performed: 1.  US guided cannulation of right common femoral artery 2.  Aortogram with bilateral lower extremity runoff 3.  Moderate sedation with fentanyl and versed for 25 minutes   Indications: 57 year old male with history of left SFA stenting.  He has occluded stents that are known by duplex now indicated for angiogram possible intervention possible diagnostic.  Findings: Left external iliac artery has approximately 50% stenosis.  The common femoral artery has greater than 50% stenosis.  The SFA is occluded at its origin and reconstitutes at the knee popliteal artery with three-vessel runoff to the foot.  On the right side there is three-vessel runoff.  Patient will need consideration for left common femoral endarterectomy with left femoral to below-knee popliteal artery bypass per   Procedure:  The patient was identified in the holding area and taken to room 8.  The patient was then placed supine on the table and prepped and draped in the usual sterile fashion.  A time out was called.  Ultrasound was used to evaluate the right common femoral artery.  It was patent .  A digital ultrasound image was acquired.  A micropuncture needle was used to access the right common femoral artery under ultrasound guidance.  An 018 wire was advanced without resistance and a micropuncture sheath was placed.  The 018 wire was removed and a benson wire was placed.  The micropuncture sheath was exchanged for a 5 french sheath.  An omniflush catheter was advanced over the wire to the level of L-1.  An abdominal angiogram was obtained followed by bilateral lower extremity runoff.  With the above findings he will be considered for bypass surgery.  He tolerated procedure without immediate  complication.  Contrast: 97cc   Trixy Loyola C. Randie Heinzain, MD [facility] [place] Office: [phone] Pager: [phone]

## 2017-05-06 NOTE — Consult Note (Signed)
Cardiology Consultation:   Patient ID: Willie Morales; [id number]; 11/22/1960   Admit date: 05/06/2017 Date of Consult: 05/06/2017  Primary Care Provider: Benita Stabile, MD Primary Cardiologist: Dr. Kirke Corin Primary Electrophysiologist:  None   Patient Profile:   Willie Morales is a 57 y.o. male with a a PMH of non-obstructive CAD (cath 2009), HTN, HLD, paroxysmal atrial fibrillation, DM type 2, PVD s/p multiple b/l LE procedures and tobacco abuse who is being seen today for the evaluation of preoperative risk assessment at the request of Dr. Imogene Burn.  History of Present Illness:   Mr. Goldston presented today for aortogram and left leg runoff in preparation for L fem-pop bypass scheduled for Thursday 05/09/17. He states he has been having LLE pain and difficulty ambulating for 1 month. He had been experiencing claudication with ambulating short distances, however now is having pain at rest, limiting his mobility. He has had multiple procedures over the past year to address b/l LE PVD.   Prior to today's visit, he states he has been doing well from a cardiac standpoint. Notes remote history of heart problems, specifically a "minor heart attack", for which he underwent a stress test, but does not recall having a cardiac catheterization at the time. He states the heart attack was discovered incidentally during evaluation for flank pain. He has never experienced chest pain. He denies SOB or DOE. He is able to ambulate up stairs without experiencing anginal complaints. He is limited in mobility by claudication. He denies syncope, pre-syncope, dizziness, lightheadedness, LE edema, orthopnea, or PND.   Chart review revealed a cardiac cath 07/2007 with non-obstructive CAD with 30% lesion in the mRCA, PDA, and diagonal, and 45% lesion in the LAD and mLAD. Also with history of HTN, HLD (last LDL 102 02/2016), paroxysmal atrial fibrillation on eliquis (on hold for upcoming procedure), poorly controlled DM type 2 (last  A1C 11.2 02/2016), and ongoing tobacco abuse. His last echocardiogram 02/2016 had EF 60-65% and no regional wall motion abnormalities. He has not had an ischemic evaluation since 2009.    Past Medical History:  Diagnosis Date  . Coronary artery disease   . Diabetes mellitus   . Dyslipidemia   . History of kidney stones   . Hypertension   . Paroxysmal atrial fibrillation (HCC) 12/2010  . Tobacco abuse     Past Surgical History:  Procedure Laterality Date  . ABDOMINAL AORTOGRAM W/LOWER EXTREMITY N/A 07/05/2016   Procedure: Abdominal Aortogram w/Lower Extremity;  Surgeon: Fransisco Hertz, MD;  Location: [facility] INVASIVE CV LAB;  Service: Cardiovascular;  Laterality: N/A;  . BACK SURGERY  1991  . CARDIAC CATHETERIZATION  07/31/2007   This showed nonobstructive coronary artery disease   He had 30% lesion in the mid RCA, 30% lesion in the PDA, 45% lesion in the LAD and mid  LAD, and 30% lesion in the diagonal.  The left circumflex was normal.  The LV function was 60%.     Marland Kitchen KIDNEY STONE SURGERY  2011  . LOWER EXTREMITY ANGIOGRAPHY N/A 12/06/2016   Procedure: Lower Extremity Angiography;  Surgeon: Fransisco Hertz, MD;  Location: [facility] INVASIVE CV LAB;  Service: Cardiovascular;  Laterality: N/A;  . PERIPHERAL VASCULAR ATHERECTOMY Right 07/19/2016   Procedure: Peripheral Vascular Atherectomy;  Surgeon: Fransisco Hertz, MD;  Location: [facility] INVASIVE CV LAB;  Service: Cardiovascular;  Laterality: Right;  . PERIPHERAL VASCULAR BALLOON ANGIOPLASTY Left 07/05/2016   Procedure: Peripheral Vascular Balloon Angioplasty;  Surgeon: Fransisco Hertz, MD;  Location:  [facility] INVASIVE CV LAB;  Service: Cardiovascular;  Laterality: Left;  SFA  . PERIPHERAL VASCULAR INTERVENTION  12/06/2016   Procedure: PERIPHERAL VASCULAR INTERVENTION;  Surgeon: Fransisco Hertz, MD;  Location: [facility] INVASIVE CV LAB;  Service: Cardiovascular;;  LEFT     Home Medications:  Prior to Admission medications   Medication Sig Start Date End Date Taking? Authorizing  Provider  apixaban (ELIQUIS) 5 MG TABS tablet Take 1 tablet (5 mg total) by mouth 2 (two) times daily. 03/26/16  Yes Jodelle Gross, NP  atorvastatin (LIPITOR) 10 MG tablet Take 10 mg by mouth daily.  11/12/16  Yes [provider]  benazepril (LOTENSIN) 10 MG tablet Take 1 tablet (10 mg total) by mouth daily. 04/03/16  Yes Jodelle Gross, NP  clopidogrel (PLAVIX) 75 MG tablet Take 1 tablet (75 mg total) by mouth daily. 07/05/16  Yes Fransisco Hertz, MD  gabapentin (NEURONTIN) 300 MG capsule Take 1 capsule (300 mg total) by mouth 3 (three) times daily. Patient taking differently: Take 300 mg by mouth daily.  08/17/16  Yes Fransisco Hertz, MD  glimepiride (AMARYL) 4 MG tablet Take 1 tablet (4 mg total) by mouth daily with breakfast. 03/19/16  Yes Jodelle Gross, NP  HYDROcodone-acetaminophen (NORCO/VICODIN) 5-325 MG tablet Take one-two tabs po q 4-6 hrs prn pain Patient taking differently: Take 1 tablet by mouth at bedtime as needed for moderate pain.  04/05/16  Yes Triplett, Tammy, PA-C  levothyroxine (SYNTHROID, LEVOTHROID) 25 MCG tablet Take 25 mcg by mouth daily before breakfast.    Yes [provider]  metoprolol tartrate (LOPRESSOR) 25 MG tablet Take 1 tablet (25 mg total) by mouth 2 (two) times daily. 03/19/16  Yes Jodelle Gross, NP  sitaGLIPtin-metformin (JANUMET) 50-1000 MG tablet Take 1 tablet by mouth 2 (two) times daily.   Yes [provider]  pregabalin (LYRICA) 50 MG capsule Take 1 capsule (50 mg total) by mouth 3 (three) times daily. Patient not taking: Reported on 05/01/2017 01/25/17   Fransisco Hertz, MD    Inpatient Medications: Scheduled Meds:  Continuous Infusions: . sodium chloride 100 mL/hr at 05/06/17 1045   PRN Meds:   Allergies:    Allergies  Allergen Reactions  . Ciprofloxacin Rash    Social History:   Social History   Socioeconomic History  . Marital status: Married    Spouse name: Not on file  . Number of children: Not on  file  . Years of education: Not on file  . Highest education level: Not on file  Social Needs  . Financial resource strain: Not on file  . Food insecurity - worry: Not on file  . Food insecurity - inability: Not on file  . Transportation needs - medical: Not on file  . Transportation needs - non-medical: Not on file  Occupational History  . Occupation: Nutritional therapist    Comment: by trade  Tobacco Use  . Smoking status: Current Every Day Smoker    Packs/day: 1.00    Years: 35.00    Pack years: 35.00    Types: Cigarettes    Last attempt to quit: 12/18/2015    Years since quitting: 1.3  . Smokeless tobacco: Never Used  . Tobacco comment: quit "3 months ago"  Substance and Sexual Activity  . Alcohol use: No  . Drug use: No  . Sexual activity: Not on file  Other Topics Concern  . Not on file  Social History Narrative   Has 2 children  [place] in [place]   Currently unemployed    Family History:    Family History  Problem Relation Age of Onset  . Coronary artery disease Father        CABG in early 7950's  . Heart attack Father   . Heart disease Father   . Cancer Mother   . Heart disease Mother   . Heart disease Brother   . Diabetes Maternal Grandmother   . SIDS Sister      ROS:  Please see the history of present illness.   All other ROS reviewed and negative.     Physical Exam/Data:   Vitals:   05/06/17 1002  BP: 132/70  Pulse: (!) 53  Resp: 18  Temp: (!) 97.2 F (36.2 C)  TempSrc: Oral  SpO2: 99%  Weight: 180 lb (81.6 kg)  Height: 6' (1.829 m)   No intake or output data in the 24 hours ending 05/06/17 1315 Filed Weights   05/06/17 1002  Weight: 180 lb (81.6 kg)   Body mass index is 24.41 kg/m.  General:  Well nourished, well developed, laying in bed in no acute distress HEENT: sclera anicteric  Neck: no JVD Vascular: No carotid bruits; distal pulses 1+ RLE, not palpable on LLE Cardiac:  normal S1, S2; RRR; no murmurs, gallops, or  rubs Lungs:  clear to auscultation bilaterally, no wheezing, rhonchi or rales  Abd: NABS, soft, nontender, no hepatomegaly Ext: no edema; TTP of LLE Musculoskeletal:  No deformities, BUE and BLE strength normal and equal Skin: warm and dry  Neuro:  CNs 2-12 intact, no focal abnormalities noted Psych:  Normal affect   EKG:  The EKG was personally reviewed and demonstrates:  Atrial fibrillation with non-specific ST abnormalities in V3-6 (unchanged from previous)  Relevant CV Studies:  Echocardiogram: 03/20/2016: Study Conclusions  - Left ventricle: The cavity size was normal. Wall thickness was   increased in a pattern of mild LVH. Systolic function was normal.   The estimated ejection fraction was in the range of 60% to 65%.   Wall motion was normal; there were no regional wall motion   abnormalities. The study was not technically sufficient to allow   evaluation of LV diastolic dysfunction due to atrial   fibrillation. - Aortic valve: Mildly calcified annulus. Trileaflet; mildly   thickened leaflets. - Mitral valve: Mildly thickened leaflets . There was mild   regurgitation. - Left atrium: The atrium was moderately dilated. - Right ventricle: Systolic function was mildly reduced. - Tricuspid valve: There was mild regurgitation.  Cardiac catheterization 07/2007:  On June 11, he underwent cardiac catheterization by Dr. Tonny BollmanMichael Cooper.  This showed nonobstructive coronary artery disease.  He had 30% lesion  in the mid RCA, 30% lesion in the PDA, 45% lesion in the LAD and mid  LAD, and 30% lesion in the diagonal.  The left circumflex was normal.  The LV function was 60%.  Laboratory Data:  Chemistry Recent Labs  Lab 05/06/17 1054  NA 138  K 4.8  CL 103  GLUCOSE 184*  BUN 15  CREATININE 0.90    No results for input(s): PROT, ALBUMIN, AST, ALT, ALKPHOS, BILITOT in the last 168 hours. Hematology Recent Labs  Lab 05/06/17 1054  HGB 16.7  HCT 49.0   Cardiac EnzymesNo  results for input(s): TROPONINI in the last 168 hours. No results for input(s): TROPIPOC in the last 168 hours.  BNPNo results for input(s): BNP, PROBNP in the last 168 hours.  DDimer No results for input(s): DDIMER in the last 168 hours.  Radiology/Studies:  No results found.  Assessment and Plan:   1. Preoperative risk assessment: patient has history of non-obstructive CAD on last cath in 2009. Has not had an ischemic evaluation since that time. Last echo 2018 with EF 60-65% and no wall motion abnormalities. He has never experience CP or anginal complaints with exertion. In the past month he has been debilitated by claudication, however is able to complete 4 METs without angina. He is planned for L fem-pop bypass 05/09/17.  - Based on the revised cardiac risk index, patient has a score of 1 (abnormal stress test) with a 6.0% risk of cardiac complications in the perioperative setting.  - Do not anticipate further cardiac work-up at this time.   2. Non-obstructive CAD: patient has been without anginal complaints. EKG is non-ischemic - Continue statin  3. Paroxysmal atrial fibrillation: rate well controlled. Eliquis on hold x4 days now for upcoming procedure.  - Continue metoprolol - Restart eliquis once cleared by primary team for CHA2DS2-VASc Score of 3 (HTN, DM, Vascular)  4. HTN: BP stable  - Continue metoprolol and benzapril  5. PVD: s/p multiple b/l procedures; planned for L fem-pop bypass 3/21 - Continue plavix and management per primary team  6. Tobacco abuse: smoking cessation encouraged - Nicotine patch while inpatient      For questions or updates, please contact [facility] Please consult [email or web address] for contact info under Cardiology/STEMI.   Signed, Beatriz Stallion, PA-C  05/06/2017 1:15 PM [phone]

## 2017-05-06 NOTE — Interval H&P Note (Signed)
   History and Physical Update  The patient was interviewed and re-examined.  The patient's previous History and Physical has been reviewed and is unchanged from my consult.  There is no change in the plan of care: aortogram and left leg runoff.   I discussed with the patient the nature of angiographic procedures, especially the limited patencies of any endovascular intervention.    The patient is aware of that the risks of an angiographic procedure include but are not limited to: bleeding, infection, access site complications, renal failure, embolization, rupture of vessel, dissection, arteriovenous fistula, possible need for emergent surgical intervention, possible need for surgical procedures to treat the patient's pathology, anaphylactic reaction to contrast, and stroke and death.    The patient is aware of the risks and agrees to proceed.   Leonides SakeBrian Hiroshi Krummel, MD, FACS [facility] [place] Office: [phone] Pager: [phone]  05/06/2017, 10:02 AM

## 2017-05-07 ENCOUNTER — Observation Stay (HOSPITAL_BASED_OUTPATIENT_CLINIC_OR_DEPARTMENT_OTHER): Payer: BLUE CROSS/BLUE SHIELD

## 2017-05-07 ENCOUNTER — Other Ambulatory Visit: Payer: Self-pay | Admitting: *Deleted

## 2017-05-07 ENCOUNTER — Encounter (HOSPITAL_COMMUNITY): Payer: Self-pay | Admitting: Vascular Surgery

## 2017-05-07 DIAGNOSIS — E1151 Type 2 diabetes mellitus with diabetic peripheral angiopathy without gangrene: Secondary | ICD-10-CM | POA: Diagnosis not present

## 2017-05-07 DIAGNOSIS — I70229 Atherosclerosis of native arteries of extremities with rest pain, unspecified extremity: Secondary | ICD-10-CM

## 2017-05-07 LAB — CBC
HCT: 43.9 % (ref 39.0–52.0)
Hemoglobin: 14.3 g/dL (ref 13.0–17.0)
MCH: 29.3 pg (ref 26.0–34.0)
MCHC: 32.6 g/dL (ref 30.0–36.0)
MCV: 90 fL (ref 78.0–100.0)
Platelets: 213 10*3/uL (ref 150–400)
RBC: 4.88 MIL/uL (ref 4.22–5.81)
RDW: 14 % (ref 11.5–15.5)
WBC: 15 10*3/uL — ABNORMAL HIGH (ref 4.0–10.5)

## 2017-05-07 LAB — BASIC METABOLIC PANEL
Anion gap: 11 (ref 5–15)
BUN: 12 mg/dL (ref 6–20)
CO2: 22 mmol/L (ref 22–32)
Calcium: 9.1 mg/dL (ref 8.9–10.3)
Chloride: 103 mmol/L (ref 101–111)
Creatinine, Ser: 0.93 mg/dL (ref 0.61–1.24)
GFR calc Af Amer: >60 mL/min (ref >60)
GFR calc non Af Amer: >60 mL/min (ref >60)
Glucose, Bld: 172 mg/dL — ABNORMAL HIGH (ref 65–99)
Potassium: 4.6 mmol/L (ref 3.5–5.1)
Sodium: 136 mmol/L (ref 135–145)

## 2017-05-07 LAB — GLUCOSE, CAPILLARY: Glucose-Capillary: 163 mg/dL — ABNORMAL HIGH (ref 65–99)

## 2017-05-07 MED FILL — Lidocaine HCl Local Inj 1%: INTRAMUSCULAR | Qty: 20 | Status: AC

## 2017-05-07 MED FILL — Heparin Sodium (Porcine) 2 Unit/ML in Sodium Chloride 0.9%: INTRAMUSCULAR | Qty: 1000 | Status: AC

## 2017-05-07 NOTE — Discharge Instructions (Signed)
° °  [facility]  Discharge Instructions  Lower Extremity Angiogram; Angioplasty/Stenting  Please refer to the following instructions for your post-procedure care. Your surgeon or physician assistant will discuss any changes with you.  Activity  Avoid lifting more than 8 pounds (1 gallons of milk) for 72 hours (3 days) after your procedure. You may walk as much as you can tolerate. It's OK to drive after 72 hours.  Bathing/Showering  You may shower the day after your procedure. If you have a bandage, you may remove it at 24- 48 hours. Clean your incision site with mild soap and water. Pat the area dry with a clean towel.  Diet  Resume your pre-procedure diet. There are no special food restrictions following this procedure. All patients with peripheral vascular disease should follow a low fat/low cholesterol diet. In order to heal from your surgery, it is CRITICAL to get adequate nutrition. Your body requires vitamins, minerals, and protein. Vegetables are the best source of vitamins and minerals. Vegetables also provide the perfect balance of protein. Processed food has little nutritional value, so try to avoid this.  Medications  Resume taking all of your medications unless your doctor tells you not to. If your incision is causing pain, you may take over-the-counter pain relievers such as acetaminophen (Tylenol)  DO NOT RESUME YOUR ELIQUIS BECAUSE IT IS A BLOOD THINNER AND YOU ARE HAVING SURGERY ON Thursday.  DO NOT RESUME YOUR JANUMET BECAUSE IT HAS METFORMIN IN IT AND IT REACTS ON YOUR KIDNEYS WITH THE CONTRAST (DYE) THAT YOU HAD FOR YOUR ANGIOGRAM.  WE WILL RESTART THIS AFTER YOUR SURGERY.  Follow Up  Follow up will be arranged at the time of your procedure. You may have an office visit scheduled or may be scheduled for surgery. Ask your surgeon if you have any questions.  Please call us immediately for any of the following conditions: Severe or  worsening pain your legs or feet at rest or with walking. Increased pain, redness, drainage at your groin puncture site. Fever of 101 degrees or higher. If you have any mild or slow bleeding from your puncture site: lie down, apply firm constant pressure over the area with a piece of gauze or a clean wash cloth for 30 minutes- no peeking!, call 911 right away if you are still bleeding after 30 minutes, or if the bleeding is heavy and unmanageable.  Reduce your risk factors of vascular disease:  Stop smoking. If you would like help call [facility] at 1-800-QUIT-NOW (703-124-84461-819-385-4145) or [place] at [phone]. Manage your cholesterol Maintain a desired weight Control your diabetes Keep your blood pressure down  If you have any questions, please call the office at [phone]

## 2017-05-07 NOTE — Progress Notes (Signed)
Lower Ext. Vein Mapping Completed. Marilynne Halstedita Morissa Obeirne, BS, RDMS, RVT

## 2017-05-07 NOTE — Progress Notes (Addendum)
  Progress Note    05/07/2017 7:19 AM 1 Day Post-Op  Subjective:  Very upset about his diet  Afebrile HR 60's-100's Afib 120's-160's systolic 97% RA  Vitals:   05/06/17 2135 05/07/17 0250  BP: (!) 122/94 127/83  Pulse: 88 77  Resp: 16 13  Temp: 98.5 F (36.9 C) 98.8 F (37.1 C)  SpO2: 98% 97%    Physical Exam: Cardiac:  irrregular Lungs:  Non labored Incisions:  Right groin soft without hematoma Extremities:  Right foot is warm    CBC    Component Value Date/Time   WBC 15.0 (H) 05/07/2017 0243   RBC 4.88 05/07/2017 0243   HGB 14.3 05/07/2017 0243   HCT 43.9 05/07/2017 0243   PLT 213 05/07/2017 0243   MCV 90.0 05/07/2017 0243   MCH 29.3 05/07/2017 0243   MCHC 32.6 05/07/2017 0243   RDW 14.0 05/07/2017 0243   LYMPHSABS 2.9 12/10/2016 1332   MONOABS 1.3 (H) 12/10/2016 1332   EOSABS 0.4 12/10/2016 1332   BASOSABS 0.1 12/10/2016 1332    BMET    Component Value Date/Time   NA 136 05/07/2017 0243   K 4.6 05/07/2017 0243   CL 103 05/07/2017 0243   CO2 22 05/07/2017 0243   GLUCOSE 172 (H) 05/07/2017 0243   BUN 12 05/07/2017 0243   CREATININE 0.93 05/07/2017 0243   CALCIUM 9.1 05/07/2017 0243   GFRNONAA >60 05/07/2017 0243   GFRAA >60 05/07/2017 0243    INR    Component Value Date/Time   INR 1.0 07/30/2007 1820     Intake/Output Summary (Last 24 hours) at 05/07/2017 0719 Last data filed at 05/07/2017 0000 Gross per 24 hour  Intake 685.44 ml  Output 350 ml  Net 335.44 ml     Assessment:  57 y.o. male is s/p:  Procedure Performed: 1.  US guided cannulation of right common femoral artery 2.  Aortogram with bilateral lower extremity runoff 3.  Moderate sedation with fentanyl and versed for 25 minutes  1 Day Post-Op  Plan: -pt right groin soft without hematoma -Pt with afib normally on eliquis.  Will hold this for surgery on Thursday-per Dr. Imogene Burnhen, <1% risk of CVA per day -will get vein mapping today and then discharge later today  -Discussed  specifically with pt to not take Eliquis or Janumet at discharge and we will restart after his surgery.  He expressed understanding.     Doreatha MassedSamantha Rhyne, PA-C [facility] [phone] 05/07/2017 7:19 AM  Addendum  I have independently interviewed and examined the patient, and I agree with the physician assistant's findings.  Pt cleared for surgery by Cardiology.  Will get B GSV mapping and discharge patient to home.  - Pt scheduled for L CFA to BK pop BPG on Thursday   Leonides SakeBrian Malcomb Gangemi, MD, [facility] [place] [facility] Office: [phone] Pager: [phone]  05/07/2017, 9:44 AM

## 2017-05-07 NOTE — Discharge Summary (Addendum)
Discharge Summary    Willie Morales 09/04/1960 57 y.o. male  [id number]  Admission Date: 05/06/2017  Discharge Date: 05/07/17  Physician: Fransisco Hertz, MD  Admission Diagnosis: pad   HPI:   This is a 57 y.o. male who presents cc: left leg pain.  Prior procedure include: 1. L SFA PTA+S (12/06/16) 2. OA+DCB PTA R fem-pop (07/19/16)for sub-total occlusion of R SFA and pop 3. L SFA SIA x 2 (07/05/16)for L SFA occlusion  The patient baseline has bilateral neuropathic pain in legs related to diabetes.Over the last month, the patient had severe increase in L leg pain.  He has very short distance claudication and rest pain at this point.  The patient is NOT able to complete their activities of daily living.    Hospital Course:  The patient was admitted to the hospital and taken to the operating room on 05/06/2017 and underwent: Procedure Performed: 1.  US guided cannulation of right common femoral artery 2.  Aortogram with bilateral lower extremity runoff 3.  Moderate sedation with fentanyl and versed for 25 minutes  Findings: Left external iliac artery has approximately 50% stenosis.  The common femoral artery has greater than 50% stenosis.  The SFA is occluded at its origin and reconstitutes at the knee popliteal artery with three-vessel runoff to the foot.  On the right side there is three-vessel runoff.  Patient will need consideration for left common femoral endarterectomy with left femoral to below-knee popliteal artery bypass   The pt tolerated the procedure well and was transported to the PACU in good condition.   Cardiology was consulted for cardiac clearance.  Mr. Ledo is at increased but acceptable risk for left femoral popliteal bypass surgery.  He understands the risk, but feels it is acceptable to reduce his claudication pain and possibly salvage the limb at some point.  He understands he is at much higher risk of worsening PAD and graft occlusion as a  smoker, but realistically thinks he cannot quit.  He is interested in nicotine replacement patches.  He also has poorly controlled diabetes which will lead to a greater likelihood of poor wound healing, which he understands.  No further cardiac testing is recommended.  Thanks for the consultation.  He wishes to proceed with surgery as scheduled.  Follow-up with Dr. Kirke Corin after discharge.  By POD 1, he was doing well.  He was instructed not to take his Eliquis bc of having surgery on Thursday.  He was instructed not to take his Janumet as he had contrast and this has Metformin.   He had vein mapping prior to discharge.  The remainder of the hospital course consisted of increasing mobilization and increasing intake of solids without difficulty.  CBC    Component Value Date/Time   WBC 15.0 (H) 05/07/2017 0243   RBC 4.88 05/07/2017 0243   HGB 14.3 05/07/2017 0243   HCT 43.9 05/07/2017 0243   PLT 213 05/07/2017 0243   MCV 90.0 05/07/2017 0243   MCH 29.3 05/07/2017 0243   MCHC 32.6 05/07/2017 0243   RDW 14.0 05/07/2017 0243   LYMPHSABS 2.9 12/10/2016 1332   MONOABS 1.3 (H) 12/10/2016 1332   EOSABS 0.4 12/10/2016 1332   BASOSABS 0.1 12/10/2016 1332    BMET    Component Value Date/Time   NA 136 05/07/2017 0243   K 4.6 05/07/2017 0243   CL 103 05/07/2017 0243   CO2 22 05/07/2017 0243   GLUCOSE 172 (H) 05/07/2017 0243   BUN 12 05/07/2017  0243   CREATININE 0.93 05/07/2017 0243   CALCIUM 9.1 05/07/2017 0243   GFRNONAA >60 05/07/2017 0243   GFRAA >60 05/07/2017 0243      Discharge Instructions    Discharge patient   Complete by:  As directed    Discharge disposition:  01-Home or Self Care   Discharge patient date:  05/07/2017      Discharge Diagnosis:  pad  Secondary Diagnosis: Patient Active Problem List   Diagnosis Date Noted  . Atherosclerosis of artery of extremity with rest pain (HCC)   . Preoperative cardiovascular examination   . Leukocytosis 12/10/2016  .  Atherosclerosis of native arteries of extremity with rest pain (HCC) 07/05/2016  . Paroxysmal atrial fibrillation (HCC)   . Coronary artery disease    Past Medical History:  Diagnosis Date  . Coronary artery disease   . Diabetes mellitus   . Dyslipidemia   . History of kidney stones   . Hypertension   . Paroxysmal atrial fibrillation (HCC) 12/2010  . Tobacco abuse      Allergies as of 05/07/2017      Reactions   Ciprofloxacin Rash      Medication List    TAKE these medications   apixaban 5 MG Tabs tablet Commonly known as:  ELIQUIS Take 1 tablet (5 mg total) by mouth 2 (two) times daily.   atorvastatin 10 MG tablet Commonly known as:  LIPITOR Take 10 mg by mouth daily.   benazepril 10 MG tablet Commonly known as:  LOTENSIN Take 1 tablet (10 mg total) by mouth daily.   clopidogrel 75 MG tablet Commonly known as:  PLAVIX Take 1 tablet (75 mg total) by mouth daily.   gabapentin 300 MG capsule Commonly known as:  NEURONTIN Take 1 capsule (300 mg total) by mouth 3 (three) times daily. What changed:  when to take this   glimepiride 4 MG tablet Commonly known as:  AMARYL Take 1 tablet (4 mg total) by mouth daily with breakfast.   HYDROcodone-acetaminophen 5-325 MG tablet Commonly known as:  NORCO/VICODIN Take one-two tabs po q 4-6 hrs prn pain What changed:    how much to take  how to take this  when to take this  reasons to take this  additional instructions   levothyroxine 25 MCG tablet Commonly known as:  SYNTHROID, LEVOTHROID Take 25 mcg by mouth daily before breakfast.   metoprolol tartrate 25 MG tablet Commonly known as:  LOPRESSOR Take 1 tablet (25 mg total) by mouth 2 (two) times daily.   pregabalin 50 MG capsule Commonly known as:  LYRICA Take 1 capsule (50 mg total) by mouth 3 (three) times daily.   sitaGLIPtin-metformin 50-1000 MG tablet Commonly known as:  JANUMET Take 1 tablet by mouth 2 (two) times daily.       Prescriptions  given: none  Instructions: [facility]  Discharge Instructions  Lower Extremity Angiogram; Angioplasty/Stenting  Please refer to the following instructions for your post-procedure care. Your surgeon or physician assistant will discuss any changes with you.  Activity  Avoid lifting more than 8 pounds (1 gallons of milk) for 72 hours (3 days) after your procedure. You may walk as much as you can tolerate. It's OK to drive after 72 hours.  Bathing/Showering  You may shower the day after your procedure. If you have a bandage, you may remove it at 24- 48 hours. Clean your incision site with mild soap and water. Pat the area dry with a clean  towel.  Diet  Resume your pre-procedure diet. There are no special food restrictions following this procedure. All patients with peripheral vascular disease should follow a low fat/low cholesterol diet. In order to heal from your surgery, it is CRITICAL to get adequate nutrition. Your body requires vitamins, minerals, and protein. Vegetables are the best source of vitamins and minerals. Vegetables also provide the perfect balance of protein. Processed food has little nutritional value, so try to avoid this.  Medications  Resume taking all of your medications unless your doctor tells you not to. If your incision is causing pain, you may take over-the-counter pain relievers such as acetaminophen (Tylenol)  DO NOT RESUME YOUR ELIQUIS BECAUSE IT IS A BLOOD THINNER AND YOU ARE HAVING SURGERY ON Thursday.  DO NOT RESUME YOUR JANUMET BECAUSE IT HAS METFORMIN IN IT AND IT REACTS ON YOUR KIDNEYS WITH THE CONTRAST (DYE) THAT YOU HAD FOR YOUR ANGIOGRAM.  WE WILL RESTART THIS AFTER YOUR SURGERY.  Follow Up  Follow up will be arranged at the time of your procedure. You may have an office visit scheduled or may be scheduled for surgery. Ask your surgeon if you have any questions.  Please call us immediately for any of  the following conditions: .Severe or worsening pain your legs or feet at rest or with walking. .Increased pain, redness, drainage at your groin puncture site. .Fever of 101 degrees or higher. .If you have any mild or slow bleeding from your puncture site: lie down, apply firm constant pressure over the area with a piece of gauze or a clean wash cloth for 30 minutes- no peeking!, call 911 right away if you are still bleeding after 30 minutes, or if the bleeding is heavy and unmanageable.  Reduce your risk factors of vascular disease:   Stop smoking. If you would like help call [facility] at 1-800-QUIT-NOW (585-229-70011-(650) 584-5202) or [facility] at [phone].  Manage your cholesterol  Maintain a desired weight  Control your diabetes  Keep your blood pressure down   If you have any questions, please call the office at [phone]  Disposition: home  Patient's condition: is Good  Follow up: 1. Dr. Imogene Burnhen in 2 weeks   Doreatha MassedSamantha Rhyne, PA-C [facility] [phone] 05/07/2017  11:05 AM  Addendum  I agree with my PA's discharge summary.  This patient underwent extensive L iliofemoral endarterectomy with Dacron patch angioplasty and L CFA to BK popliteal artery bypass with Propaten.  The patient's left greater saphenous vein was harvest but failed to distend, so Propaten was necessary.  The below-the-knee popliteal artery also required bovine patch angioplasty due to the calcific wall.  The patient's post-op recovery was unremarkable.  He has significant augmentation in his distal flow.  I reiterated again the need for lifestyle changes in this uncontrolled diabetic that actively smokes.  He will follow up in the office in 2 weeks for wound checks.  Leonides SakeBrian Jhade Berko, MD, FACS [facility] [place] Office: [phone] Pager: [phone]  05/14/2017, 7:58 AM

## 2017-05-09 ENCOUNTER — Inpatient Hospital Stay (HOSPITAL_COMMUNITY): Payer: BLUE CROSS/BLUE SHIELD | Admitting: Certified Registered"

## 2017-05-09 ENCOUNTER — Inpatient Hospital Stay (HOSPITAL_COMMUNITY)
Admission: RE | Admit: 2017-05-09 | Discharge: 2017-05-12 | DRG: 271 | Disposition: A | Discharge status: Home Health | Payer: BLUE CROSS/BLUE SHIELD | Attending: Vascular Surgery | Admitting: Vascular Surgery

## 2017-05-09 ENCOUNTER — Encounter (HOSPITAL_COMMUNITY)
Admission: RE | Disposition: A | Discharge status: Home Health | Payer: Self-pay | Source: Home / Self Care | Attending: Vascular Surgery

## 2017-05-09 ENCOUNTER — Encounter (HOSPITAL_COMMUNITY): Payer: Self-pay | Admitting: Surgery

## 2017-05-09 ENCOUNTER — Other Ambulatory Visit: Payer: Self-pay

## 2017-05-09 ENCOUNTER — Inpatient Hospital Stay (HOSPITAL_COMMUNITY)
Admission: RE | Admit: 2017-05-09 | Payer: BLUE CROSS/BLUE SHIELD | Source: Other Acute Inpatient Hospital | Admitting: Vascular Surgery

## 2017-05-09 DIAGNOSIS — Z7902 Long term (current) use of antithrombotics/antiplatelets: Secondary | ICD-10-CM

## 2017-05-09 DIAGNOSIS — I743 Embolism and thrombosis of arteries of the lower extremities: Secondary | ICD-10-CM | POA: Diagnosis present

## 2017-05-09 DIAGNOSIS — Z881 Allergy status to other antibiotic agents status: Secondary | ICD-10-CM | POA: Diagnosis not present

## 2017-05-09 DIAGNOSIS — I739 Peripheral vascular disease, unspecified: Secondary | ICD-10-CM | POA: Diagnosis present

## 2017-05-09 DIAGNOSIS — I70222 Atherosclerosis of native arteries of extremities with rest pain, left leg: Secondary | ICD-10-CM | POA: Diagnosis not present

## 2017-05-09 DIAGNOSIS — Z833 Family history of diabetes mellitus: Secondary | ICD-10-CM

## 2017-05-09 DIAGNOSIS — F17210 Nicotine dependence, cigarettes, uncomplicated: Secondary | ICD-10-CM | POA: Diagnosis present

## 2017-05-09 DIAGNOSIS — Z7901 Long term (current) use of anticoagulants: Secondary | ICD-10-CM | POA: Diagnosis not present

## 2017-05-09 DIAGNOSIS — I70209 Unspecified atherosclerosis of native arteries of extremities, unspecified extremity: Secondary | ICD-10-CM | POA: Diagnosis present

## 2017-05-09 DIAGNOSIS — E785 Hyperlipidemia, unspecified: Secondary | ICD-10-CM | POA: Diagnosis present

## 2017-05-09 DIAGNOSIS — I10 Essential (primary) hypertension: Secondary | ICD-10-CM | POA: Diagnosis present

## 2017-05-09 DIAGNOSIS — Z87442 Personal history of urinary calculi: Secondary | ICD-10-CM

## 2017-05-09 DIAGNOSIS — Z79899 Other long term (current) drug therapy: Secondary | ICD-10-CM | POA: Diagnosis not present

## 2017-05-09 DIAGNOSIS — I2510 Atherosclerotic heart disease of native coronary artery without angina pectoris: Secondary | ICD-10-CM | POA: Diagnosis present

## 2017-05-09 DIAGNOSIS — Y838 Other surgical procedures as the cause of abnormal reaction of the patient, or of later complication, without mention of misadventure at the time of the procedure: Secondary | ICD-10-CM | POA: Diagnosis present

## 2017-05-09 DIAGNOSIS — T82868A Thrombosis of vascular prosthetic devices, implants and grafts, initial encounter: Secondary | ICD-10-CM | POA: Diagnosis present

## 2017-05-09 DIAGNOSIS — D72829 Elevated white blood cell count, unspecified: Secondary | ICD-10-CM | POA: Diagnosis present

## 2017-05-09 DIAGNOSIS — I480 Paroxysmal atrial fibrillation: Secondary | ICD-10-CM | POA: Diagnosis present

## 2017-05-09 DIAGNOSIS — E1140 Type 2 diabetes mellitus with diabetic neuropathy, unspecified: Secondary | ICD-10-CM | POA: Diagnosis present

## 2017-05-09 DIAGNOSIS — Z7984 Long term (current) use of oral hypoglycemic drugs: Secondary | ICD-10-CM

## 2017-05-09 DIAGNOSIS — E1151 Type 2 diabetes mellitus with diabetic peripheral angiopathy without gangrene: Secondary | ICD-10-CM | POA: Diagnosis present

## 2017-05-09 DIAGNOSIS — M79606 Pain in leg, unspecified: Secondary | ICD-10-CM | POA: Diagnosis present

## 2017-05-09 DIAGNOSIS — Z8249 Family history of ischemic heart disease and other diseases of the circulatory system: Secondary | ICD-10-CM | POA: Diagnosis not present

## 2017-05-09 DIAGNOSIS — Z86718 Personal history of other venous thrombosis and embolism: Secondary | ICD-10-CM | POA: Diagnosis not present

## 2017-05-09 DIAGNOSIS — Z9889 Other specified postprocedural states: Secondary | ICD-10-CM | POA: Diagnosis not present

## 2017-05-09 HISTORY — PX: ENDARTERECTOMY FEMORAL: SHX5804

## 2017-05-09 HISTORY — PX: FEMORAL-POPLITEAL BYPASS GRAFT: SHX937

## 2017-05-09 HISTORY — PX: PATCH ANGIOPLASTY: SHX6230

## 2017-05-09 LAB — COMPREHENSIVE METABOLIC PANEL
ALT: 14 U/L — ABNORMAL LOW (ref 17–63)
AST: 15 U/L (ref 15–41)
Albumin: 3.8 g/dL (ref 3.5–5.0)
Alkaline Phosphatase: 88 U/L (ref 38–126)
Anion gap: 9 (ref 5–15)
BUN: 13 mg/dL (ref 6–20)
CO2: 23 mmol/L (ref 22–32)
Calcium: 9.5 mg/dL (ref 8.9–10.3)
Chloride: 104 mmol/L (ref 101–111)
Creatinine, Ser: 0.98 mg/dL (ref 0.61–1.24)
GFR calc Af Amer: >60 mL/min (ref >60)
GFR calc non Af Amer: >60 mL/min (ref >60)
Glucose, Bld: 230 mg/dL — ABNORMAL HIGH (ref 65–99)
Potassium: 4.3 mmol/L (ref 3.5–5.1)
Sodium: 136 mmol/L (ref 135–145)
Total Bilirubin: 0.4 mg/dL (ref 0.3–1.2)
Total Protein: 8 g/dL (ref 6.5–8.1)

## 2017-05-09 LAB — CBC
HCT: 46.2 % (ref 39.0–52.0)
Hemoglobin: 15.8 g/dL (ref 13.0–17.0)
MCH: 30.5 pg (ref 26.0–34.0)
MCHC: 34.2 g/dL (ref 30.0–36.0)
MCV: 89.2 fL (ref 78.0–100.0)
Platelets: 187 10*3/uL (ref 150–400)
RBC: 5.18 MIL/uL (ref 4.22–5.81)
RDW: 13.8 % (ref 11.5–15.5)
WBC: 13.4 10*3/uL — ABNORMAL HIGH (ref 4.0–10.5)

## 2017-05-09 LAB — GLUCOSE, CAPILLARY
Glucose-Capillary: 233 mg/dL — ABNORMAL HIGH (ref 65–99)
Glucose-Capillary: 182 mg/dL — ABNORMAL HIGH (ref 65–99)
Glucose-Capillary: 141 mg/dL — ABNORMAL HIGH (ref 65–99)

## 2017-05-09 LAB — PROTIME-INR
INR: 1.11
Prothrombin Time: 14.2 seconds (ref 11.4–15.2)

## 2017-05-09 LAB — HEMOGLOBIN A1C
Hgb A1c MFr Bld: 7.2 % — ABNORMAL HIGH (ref 4.8–5.6)
Mean Plasma Glucose: 159.94 mg/dL

## 2017-05-09 LAB — TYPE AND SCREEN
ABO/RH(D): O POS
Antibody Screen: NEGATIVE

## 2017-05-09 LAB — SURGICAL PCR SCREEN
MRSA, PCR: NEGATIVE
Staphylococcus aureus: NEGATIVE

## 2017-05-09 LAB — ABO/RH: ABO/RH(D): O POS

## 2017-05-09 LAB — APTT: aPTT: 30 seconds (ref 24–36)

## 2017-05-09 SURGERY — BYPASS GRAFT FEMORAL-POPLITEAL ARTERY
Anesthesia: General | Site: Leg Upper | Laterality: Left

## 2017-05-09 MED ORDER — PROTAMINE SULFATE 10 MG/ML IV SOLN
INTRAVENOUS | Status: AC
  Filled 2017-05-09: qty 5

## 2017-05-09 MED ORDER — PROTAMINE SULFATE 10 MG/ML IV SOLN
INTRAVENOUS | Status: DC | PRN
  Administered 2017-05-09: 60 mg via INTRAVENOUS
  Administered 2017-05-09: 20 mg via INTRAVENOUS

## 2017-05-09 MED ORDER — MEPERIDINE HCL 50 MG/ML IJ SOLN
INTRAMUSCULAR | Status: AC
  Filled 2017-05-09: qty 1

## 2017-05-09 MED ORDER — SODIUM CHLORIDE 0.9 % IV SOLN
INTRAVENOUS | Status: DC | PRN
  Administered 2017-05-09: 500 mL

## 2017-05-09 MED ORDER — 0.9 % SODIUM CHLORIDE (POUR BTL) OPTIME
TOPICAL | Status: DC | PRN
  Administered 2017-05-09: 1000 mL
  Administered 2017-05-09: 2000 mL

## 2017-05-09 MED ORDER — SODIUM CHLORIDE 0.9 % IV SOLN
INTRAVENOUS | Status: DC
  Administered 2017-05-09: 19:00:00 via INTRAVENOUS

## 2017-05-09 MED ORDER — LACTATED RINGERS IV SOLN
INTRAVENOUS | Status: DC
  Administered 2017-05-09 (×3): via INTRAVENOUS

## 2017-05-09 MED ORDER — SODIUM CHLORIDE 0.9 % IJ SOLN
INTRAMUSCULAR | Status: AC
Start: 2017-05-09 — End: 2017-05-09
  Filled 2017-05-09: qty 20

## 2017-05-09 MED ORDER — FENTANYL CITRATE (PF) 250 MCG/5ML IJ SOLN
INTRAMUSCULAR | Status: AC
Start: 2017-05-09 — End: 2017-05-09
  Filled 2017-05-09: qty 5

## 2017-05-09 MED ORDER — HYDROMORPHONE HCL 1 MG/ML IJ SOLN
0.2500 mg | INTRAMUSCULAR | Status: DC | PRN
  Administered 2017-05-09 (×2): 0.5 mg via INTRAVENOUS

## 2017-05-09 MED ORDER — MIDAZOLAM HCL 2 MG/2ML IJ SOLN
INTRAMUSCULAR | Status: AC
  Filled 2017-05-09: qty 2

## 2017-05-09 MED ORDER — HEPARIN SODIUM (PORCINE) 1000 UNIT/ML IJ SOLN
INTRAMUSCULAR | Status: AC
Start: 2017-05-09 — End: 2017-05-09
  Filled 2017-05-09: qty 1

## 2017-05-09 MED ORDER — FENTANYL CITRATE (PF) 250 MCG/5ML IJ SOLN
INTRAMUSCULAR | Status: AC
  Filled 2017-05-09: qty 5

## 2017-05-09 MED ORDER — PROTAMINE SULFATE 10 MG/ML IV SOLN
INTRAVENOUS | Status: AC
  Filled 2017-05-09: qty 10

## 2017-05-09 MED ORDER — SUGAMMADEX SODIUM 200 MG/2ML IV SOLN
INTRAVENOUS | Status: DC | PRN
  Administered 2017-05-09: 200 mg via INTRAVENOUS

## 2017-05-09 MED ORDER — ROCURONIUM BROMIDE 10 MG/ML (PF) SYRINGE
PREFILLED_SYRINGE | INTRAVENOUS | Status: DC | PRN
  Administered 2017-05-09: 60 mg via INTRAVENOUS
  Administered 2017-05-09: 20 mg via INTRAVENOUS
  Administered 2017-05-09 (×4): 30 mg via INTRAVENOUS

## 2017-05-09 MED ORDER — LIDOCAINE 2% (20 MG/ML) 5 ML SYRINGE
INTRAMUSCULAR | Status: DC | PRN
  Administered 2017-05-09: 60 mg via INTRAVENOUS

## 2017-05-09 MED ORDER — METOPROLOL TARTRATE 12.5 MG HALF TABLET
ORAL_TABLET | ORAL | Status: AC
  Administered 2017-05-10: 25 mg via ORAL
  Filled 2017-05-09: qty 2

## 2017-05-09 MED ORDER — MUPIROCIN 2 % EX OINT
TOPICAL_OINTMENT | CUTANEOUS | Status: AC
  Administered 2017-05-09: 1 via TOPICAL
  Filled 2017-05-09: qty 22

## 2017-05-09 MED ORDER — CEFAZOLIN SODIUM-DEXTROSE 2-4 GM/100ML-% IV SOLN
2.0000 g | INTRAVENOUS | Status: AC
  Administered 2017-05-09 (×2): 2 g via INTRAVENOUS
  Filled 2017-05-09: qty 100

## 2017-05-09 MED ORDER — METOPROLOL TARTRATE 12.5 MG HALF TABLET
25.0000 mg | ORAL_TABLET | Freq: Once | ORAL | Status: AC
  Administered 2017-05-09: 25 mg via ORAL

## 2017-05-09 MED ORDER — PROPOFOL 10 MG/ML IV BOLUS
INTRAVENOUS | Status: AC
  Filled 2017-05-09: qty 20

## 2017-05-09 MED ORDER — HEPARIN SODIUM (PORCINE) 1000 UNIT/ML IJ SOLN
INTRAMUSCULAR | Status: DC | PRN
  Administered 2017-05-09: 2000 [IU] via INTRAVENOUS
  Administered 2017-05-09: 8500 [IU] via INTRAVENOUS
  Administered 2017-05-09 (×3): 2000 [IU] via INTRAVENOUS

## 2017-05-09 MED ORDER — MEPERIDINE HCL 50 MG/ML IJ SOLN
6.2500 mg | INTRAMUSCULAR | Status: DC | PRN
  Administered 2017-05-09: 12.5 mg via INTRAVENOUS

## 2017-05-09 MED ORDER — MIDAZOLAM HCL 5 MG/5ML IJ SOLN
INTRAMUSCULAR | Status: DC | PRN
  Administered 2017-05-09: 2 mg via INTRAVENOUS

## 2017-05-09 MED ORDER — CHLORHEXIDINE GLUCONATE 4 % EX LIQD: 60.0000 mL | Freq: Once | CUTANEOUS | Status: DC

## 2017-05-09 MED ORDER — MUPIROCIN 2 % EX OINT
1.0000 "application " | TOPICAL_OINTMENT | Freq: Once | CUTANEOUS | Status: AC
  Administered 2017-05-09: 1 via TOPICAL

## 2017-05-09 MED ORDER — FENTANYL CITRATE (PF) 250 MCG/5ML IJ SOLN
INTRAMUSCULAR | Status: DC | PRN
  Administered 2017-05-09 (×5): 50 ug via INTRAVENOUS
  Administered 2017-05-09: 100 ug via INTRAVENOUS
  Administered 2017-05-09 (×4): 50 ug via INTRAVENOUS

## 2017-05-09 MED ORDER — PHENYLEPHRINE 40 MCG/ML (10ML) SYRINGE FOR IV PUSH (FOR BLOOD PRESSURE SUPPORT)
PREFILLED_SYRINGE | INTRAVENOUS | Status: DC | PRN
  Administered 2017-05-09 (×5): 80 ug via INTRAVENOUS

## 2017-05-09 MED ORDER — SODIUM CHLORIDE 0.9 % IV SOLN: INTRAVENOUS | Status: DC

## 2017-05-09 MED ORDER — PHENYLEPHRINE HCL 10 MG/ML IJ SOLN
INTRAVENOUS | Status: DC | PRN
  Administered 2017-05-09: 20 ug/min via INTRAVENOUS

## 2017-05-09 MED ORDER — CEFAZOLIN SODIUM 1 G IJ SOLR
INTRAMUSCULAR | Status: AC
  Filled 2017-05-09: qty 20

## 2017-05-09 MED ORDER — HEMOSTATIC AGENTS (NO CHARGE) OPTIME
TOPICAL | Status: DC | PRN
  Administered 2017-05-09 (×2): 1 via TOPICAL

## 2017-05-09 MED ORDER — PROPOFOL 10 MG/ML IV BOLUS
INTRAVENOUS | Status: DC | PRN
  Administered 2017-05-09: 30 mg via INTRAVENOUS
  Administered 2017-05-09: 150 mg via INTRAVENOUS

## 2017-05-09 MED ORDER — ONDANSETRON HCL 4 MG/2ML IJ SOLN
INTRAMUSCULAR | Status: AC
Start: 2017-05-09 — End: 2017-05-09
  Filled 2017-05-09: qty 2

## 2017-05-09 MED ORDER — HYDROMORPHONE HCL 1 MG/ML IJ SOLN
INTRAMUSCULAR | Status: AC
  Filled 2017-05-09: qty 1

## 2017-05-09 SURGICAL SUPPLY — 79 items
BANDAGE ESMARK 6X9 LF (GAUZE/BANDAGES/DRESSINGS) IMPLANT
BNDG ESMARK 6X9 LF (GAUZE/BANDAGES/DRESSINGS)
CANISTER SUCT 3000ML PPV (MISCELLANEOUS) ×3 IMPLANT
CANNULA VESSEL 3MM 2 BLNT TIP (CANNULA) ×3 IMPLANT
CLIP FOGARTY SPRING 6M (CLIP) IMPLANT
CLIP VESOCCLUDE MED 24/CT (CLIP) ×3 IMPLANT
CLIP VESOCCLUDE SM WIDE 24/CT (CLIP) ×3 IMPLANT
COVER PROBE W GEL 5X96 (DRAPES) ×3 IMPLANT
CUFF TOURNIQUET SINGLE 24IN (TOURNIQUET CUFF) IMPLANT
CUFF TOURNIQUET SINGLE 34IN LL (TOURNIQUET CUFF) IMPLANT
CUFF TOURNIQUET SINGLE 44IN (TOURNIQUET CUFF) IMPLANT
DERMABOND ADVANCED (GAUZE/BANDAGES/DRESSINGS) ×1
DERMABOND ADVANCED .7 DNX12 (GAUZE/BANDAGES/DRESSINGS) ×2 IMPLANT
DRAIN CHANNEL 15F RND FF W/TCR (WOUND CARE) IMPLANT
DRAPE C-ARM 42X72 X-RAY (DRAPES) ×3 IMPLANT
DRAPE HALF SHEET 40X57 (DRAPES) IMPLANT
DRSG MEPILEX BORDER 4X4 (GAUZE/BANDAGES/DRESSINGS) ×3 IMPLANT
DRSG MEPILEX BORDER 4X8 (GAUZE/BANDAGES/DRESSINGS) ×3 IMPLANT
ELECT REM PT RETURN 9FT ADLT (ELECTROSURGICAL) ×3
ELECTRODE REM PT RTRN 9FT ADLT (ELECTROSURGICAL) ×2 IMPLANT
EVACUATOR SILICONE 100CC (DRAIN) IMPLANT
GAUZE SPONGE 4X4 16PLY XRAY LF (GAUZE/BANDAGES/DRESSINGS) ×3 IMPLANT
GLOVE BIO SURGEON STRL SZ7 (GLOVE) ×3 IMPLANT
GLOVE BIO SURGEON STRL SZ7.5 (GLOVE) ×3 IMPLANT
GLOVE BIOGEL PI IND STRL 6.5 (GLOVE) ×2 IMPLANT
GLOVE BIOGEL PI IND STRL 7.5 (GLOVE) ×4 IMPLANT
GLOVE BIOGEL PI IND STRL 8 (GLOVE) ×2 IMPLANT
GLOVE BIOGEL PI INDICATOR 6.5 (GLOVE) ×1
GLOVE BIOGEL PI INDICATOR 7.5 (GLOVE) ×2
GLOVE BIOGEL PI INDICATOR 8 (GLOVE) ×1
GLOVE ECLIPSE 7.0 STRL STRAW (GLOVE) ×3 IMPLANT
GLOVE SKINSENSE STRL SZ6.0 (GLOVE) ×3 IMPLANT
GOWN STRL REUS W/ TWL LRG LVL3 (GOWN DISPOSABLE) ×10 IMPLANT
GOWN STRL REUS W/TWL LRG LVL3 (GOWN DISPOSABLE) ×5
GRAFT PROPATEN THIN WALL 6X80 (Vascular Products) ×3 IMPLANT
HEMOSTAT SPONGE AVITENE ULTRA (HEMOSTASIS) ×6 IMPLANT
INSERT FOGARTY SM (MISCELLANEOUS) IMPLANT
KIT BASIN OR (CUSTOM PROCEDURE TRAY) ×3 IMPLANT
KIT ROOM TURNOVER OR (KITS) ×3 IMPLANT
MARKER GRAFT CORONARY BYPASS (MISCELLANEOUS) IMPLANT
NS IRRIG 1000ML POUR BTL (IV SOLUTION) ×6 IMPLANT
PACK PERIPHERAL VASCULAR (CUSTOM PROCEDURE TRAY) ×3 IMPLANT
PAD ARMBOARD 7.5X6 YLW CONV (MISCELLANEOUS) ×6 IMPLANT
PATCH HEMASHIELD 8X150 (Vascular Products) ×3 IMPLANT
PATCH VASC XENOSURE 1CMX6CM (Vascular Products) ×1 IMPLANT
PATCH VASC XENOSURE 1X6 (Vascular Products) ×2 IMPLANT
SET MICROPUNCTURE 5F STIFF (MISCELLANEOUS) IMPLANT
SPONGE INTESTINAL PEANUT (DISPOSABLE) ×3 IMPLANT
SPONGE LAP 18X18 5 PK (GAUZE/BANDAGES/DRESSINGS) ×3 IMPLANT
STAPLER VISISTAT 35W (STAPLE) ×6 IMPLANT
STOPCOCK 4 WAY LG BORE MALE ST (IV SETS) IMPLANT
SUT ETHILON 3 0 PS 1 (SUTURE) IMPLANT
SUT GORETEX 5 0 TT13 24 (SUTURE) ×3 IMPLANT
SUT GORETEX 6.0 TH-9 30 IN (SUTURE) ×3 IMPLANT
SUT GORETEX 6.0 TT13 (SUTURE) IMPLANT
SUT MNCRL AB 4-0 PS2 18 (SUTURE) ×6 IMPLANT
SUT PROLENE 5 0 C 1 24 (SUTURE) ×3 IMPLANT
SUT PROLENE 6 0 BV (SUTURE) ×3 IMPLANT
SUT PROLENE 6 0 CC (SUTURE) ×15 IMPLANT
SUT PROLENE 7 0 BV 1 (SUTURE) ×3 IMPLANT
SUT PROLENE 7 0 BV1 MDA (SUTURE) ×3 IMPLANT
SUT SILK 2 0 (SUTURE) ×1
SUT SILK 2 0 PERMA HAND 18 BK (SUTURE) IMPLANT
SUT SILK 2 0 SH (SUTURE) ×3 IMPLANT
SUT SILK 2-0 18XBRD TIE 12 (SUTURE) ×2 IMPLANT
SUT SILK 3 0 (SUTURE) ×1
SUT SILK 3-0 18XBRD TIE 12 (SUTURE) ×2 IMPLANT
SUT SILK 4 0 (SUTURE) ×1
SUT SILK 4-0 18XBRD TIE 12 (SUTURE) ×2 IMPLANT
SUT VIC AB 2-0 CT1 27 (SUTURE) ×2
SUT VIC AB 2-0 CT1 TAPERPNT 27 (SUTURE) ×4 IMPLANT
SUT VIC AB 3-0 SH 27 (SUTURE) ×3
SUT VIC AB 3-0 SH 27X BRD (SUTURE) ×6 IMPLANT
TAPE UMBILICAL COTTON 1/8X30 (MISCELLANEOUS) IMPLANT
TOWEL GREEN STERILE (TOWEL DISPOSABLE) ×3 IMPLANT
TRAY FOLEY MTR SLVR 16FR STAT (CATHETERS) ×3 IMPLANT
TUBING EXTENTION W/L.L. (IV SETS) IMPLANT
UNDERPAD 30X30 (UNDERPADS AND DIAPERS) ×3 IMPLANT
WATER STERILE IRR 1000ML POUR (IV SOLUTION) ×3 IMPLANT

## 2017-05-09 NOTE — Transfer of Care (Signed)
Immediate Anesthesia Transfer of Care Note  Patient: Willie Morales  Procedure(s) Performed: BYPASS GRAFT FEMORAL-POPLITEAL ARTERY LEFT (Left Leg Upper) ENDARTERECTOMY FEMORAL WITH DACRON PATCH ANGIOPLASTY (Left Leg Upper) PATCH ANGIOPLASTY WITH PROPATEN GRAFT  OF BELOW KNEE POPLITEAL ARTERY (Left Leg Lower)  Patient Location: PACU  Anesthesia Type:General  Level of Consciousness: awake and alert   Airway & Oxygen Therapy: Patient Spontanous Breathing and Patient connected to nasal cannula oxygen  Post-op Assessment: Report given to RN, Post -op Vital signs reviewed and stable and Patient moving all extremities X 4  Post vital signs: Reviewed and stable  Last Vitals:  Vitals Value Taken Time  BP 141/85 05/09/2017  6:35 PM  Temp    Pulse 30 05/09/2017  6:39 PM  Resp 24 05/09/2017  6:42 PM  SpO2 94 % 05/09/2017  6:39 PM  Vitals shown include unvalidated device data.  Last Pain:  Vitals:   05/09/17 0954  TempSrc:   PainSc: 0-No pain      Patients Stated Pain Goal: 0 (05/09/17 0954)  Complications: No apparent anesthesia complications

## 2017-05-09 NOTE — Interval H&P Note (Signed)
   History and Physical Update  The patient was interviewed and re-examined.  The patient's previous History and Physical has been reviewed and is unchanged from my consult except for: interval aniography.  Based on the angiogram, I recommended: Left iliofemoral endarterectomy, left common femoral artery to below-the-knee popliteal artery bypass with ipsilateral greater saphenous vein.   The risk, benefits, and alternative for bypass operations were discussed with the patient.    The patient is aware the risks include but are not limited to: bleeding, infection, myocardial infarction, stroke, limb loss, nerve damage, limb edema, need for additional procedures in the future, wound complications, and inability to complete the bypass.   The patient is aware of these risks and agreed to proceed.   Leonides SakeBrian Iann Rodier, MD, FACS [facility] [place] Office: [phone] Pager: [phone]  05/09/2017, 8:58 AM

## 2017-05-09 NOTE — Op Note (Signed)
OPERATIVE NOTE   PROCEDURE: 1.  Left iliofemoral endarterectomy with Dacron patch angioplasty 2.  Left common femoral artery to below-the-knee popliteal artery bypass with Propaten 3.  [place]inton patch placement on below-the-knee popliteal artery   PRE-OPERATIVE DIAGNOSIS: left leg rest pain, s/p thrombosis right superficial femoral artery stent  POST-OPERATIVE DIAGNOSIS: same as above   SURGEON: Leonides Sake, MD  ASSISTANT(S): Dr. Cari Caraway, Lianne Cure, [facility]  ANESTHESIA: general  ESTIMATED BLOOD LOSS: 200 cc  FINDING(S): 1.  >75% stenosis in common femoral artery extending into distal external iliac artery: resolved with endarterectomy and Dacron patch angioplasty 2.  Occluded right superficial femoral artery 3.  Limited backbleeding from profunda femoral artery  4.  Heavily calcified below-the-knee popliteal artery with eccentric plaque: improved with bovine patch angioplasty 5.  Dopplerable anterior tibial artery and posterior tibial artery at end of case: signals near completely abort with graft compression  SPECIMEN(S):  none  INDICATIONS:   Willie Morales is a 57 y.o. male who presents with left leg rest pain for one month after occlusion of his left superficial femoral artery stents.  I recommended: left iliofemoral endarterectomy with patch angioplasty, left common femoral artery to below-the-knee popliteal artery bypass.  The risk, benefits, and alternative for bypass operations were discussed with the patient.  The patient is aware the risks include but are not limited to: bleeding, infection, myocardial infarction, stroke, limb loss, nerve damage, limb edema, need for additional procedures in the future, wound complications, and inability to complete the bypass.  The patient is aware of these risks and agreed to proceed.   DESCRIPTION: After full informed written consent was obtained, the patient was brought back to the operating room and placed supine upon the  operating table.  Prior to induction, the patient was given intravenous antibiotics.  After obtaining adequate anesthesia, the patient was prepped and draped in the standard fashion for a femoral to popliteal bypass operation.  Attention was turned to the right groin.  A longitudinal incision was made over the left common femoral artery.  Using blunt dissection and electrocautery, the artery was dissected out from the inguinal ligament down to the femoral bifurcation.  The superficial femoral artery, profunda femoral artery, and external iliac artery were dissected out and vessel loops applied.  Circumflex branches were also dissected and controlled with silk ties.  This common femoral artery was found on exam to be not calcified but concerns were raised due to extensive palpable plaque in the artery.  This plaque appeared to extend into the distal 1/3 of the left external iliac artery.  I was able to dissect out a softer area in the distal external iliac artery amendable to clamping.    The patient was given 8500 units of Heparin intravenously, which was a therapeutic bolus.  In total, 16109 units of Heparin was administrated to achieve and maintain a therapeutic level of anticoagulation.  After waiting 3 minutes, I clamped the distal external iliac artery and placed all circumflex branches, and the profunda and superficial femoral arteries under tension.  I made an arteriotomy in the common femoral artery with a 11-blade and extended it with a Potts scissor proximally and distally.  After suctioning out the artery, the common femoral artery was inspected: >75% compromise of the lumen of the artery by plaque.    I began the endarterectomy in the mid-common femoral artery with a [place].  I transected the plaque at this segment and carried the dissection into the distal  external iliac artery.  I was able to extract the calcified posterior plaque from the external iliac artery.  I then carried the dissection  distally.  The plaque appeared to be attenuated in the distal common femoral artery, so I transected the plaque here.  I tacked down the plaque with 7-0 Prolene stitches.    Unfortunately, the previously soaked bovine patch was too short for this iliofemoral endarterectomy, so I obtained a Dacron patch.  I fashioned a Dacron patch for the geometry of the arteriotomy.  The patch was sewn to the artery with 2 running stitches of 6-0 Prolene, running from each end.  Prior to completing the patch angioplasty, I backbled the profunda femoral artery: limited flow,  superficial femoral artery: no flow, and distal external iliac artery: pulsatile flow.  The patch angioplasty was completed in the usual fashion.    At this point, attention was turned to the calf.  An longitudinal incision was made one finger-width posterior to the tibia.  Using blunt dissection and electrocautery, a plane was developed through the subcutaneous tissue and fascia down to the popliteal space.  The popliteal vein was dissected out and retracted medially and posteriorly.  The below-the-knee popliteal artery was dissected away from lateral popliteal vein.  This popliteal artery was found on exam: diseased and calcified.  Unfortunately the tibioperoneal trunk and anterior tibial artery were heavily calcified, so I felt these were not adequate targets.    I had concerns that this popliteal artery artery might not be suitable for use as a distal target, so I felt exploration was indicated.  I placed vessel loops around the below-the-knee popliteal artery and placed the below-the-knee popliteal artery under tension proximally and distally.    I made an arteriotomy in the below-the-knee popliteal artery with a 11-blade.  I extended this proximally and distally.  Immediately there was an eccentric calcified plaque evident. I suspected a distal anastomosis to this below-the-knee popliteal artery would be difficult without a patch angioplasty.  I  elected to use the previously soaked bovine pericardial patch.  I fashioned this patch for the geometry of this arteriotomy.  I sewed the bovine patch to the below-the-knee popliteal artery artery with a running stitch of 6-0 Prolene.  Prior to completing this patch angioplasty, I backbled the below-the-knee popliteal artery: trickle of proximal bleeding and limited distal bleeding.  I completed the bovine patch angioplasty in the usual fashion.   At this point, the patient's left greater saphenous vein was identified under Sonosite guidance.  Skip incisions was made over the greater saphenous vein from the saphenofemoral junction down to calf.  The vein conduit was found to be possibly sclerotic with size externally: 2-5 mm.  Side branches of greater saphenous vein were tied off with 4-0 silk or clipped with small titanium clips.  The saphenofemoral junction was clamped and the greater saphenous vein was transected.  The saphenofemoral junction was oversewn in a double layer with a running stitch of 5-0 prolene.  The distal extent of this conduit was tied off at the level of the calf and the vein transected proximally.  The harvested vein conduit was soaked in a heparinized saline solution.  A vessel cannula was tied to the distal end of the vein and the vein was tested by hydrodistension. Segments of this vein would not hydrodistend, so I felt this vein was not adequate for use as a bypass conduit.    At this point, I bluntly dissected a space between the  femoral condyles adjacent to the below-the-knee popliteal vessels.  I bluntly passed the long Gore metal tunneler between the femoral condyles in a subsartorial fashion to the groin incision.  The bullet on the tunneler was removed and then 6 mm Propaten was sewn to the inner cannula with a 2-0 Silk.  I passed the conduit through the metal tunnel, taking care to maintain the orientation of the conduit.    I clamped the Dacron patched common femoral artery  proximally and distally.  An incision was made in the Dacron patch and extended the incision proximally and distally with a Potts scissor.  The proximal Propaten graft was spatulated to the dimensions of the arteriotomy.  The conduit was sewn to the common femoral artery with a running stitch of CV-5.  Prior to completing this anastomosis, all vessels were backbled.  No thrombus was noted from any vessels and backbleeding was: limited as previously.  The antegrade bleeding was pulsatile.  The anastomosis was completed in the usual fashion.  Attention was then turned to the popliteal exposure.   I reset the exposure of the popliteal space.  I verified the popliteal artery was appropriately marked.  I determine the target segment for the anastomosis.  I applied tension to the artery proximally and distally with vessel loops.  I made an incision with a 11-blade in the bovine patch and extended it proximally and distally with a Potts scissor.  I pulled the conduit to appropriate tension and length, taking into account straightening out the leg.  I adjusted the length of the conduit sharply.  I spatulated the Propaten graft to meet the dimensions of the arteriotomy.  The conduit was sewn to the bovine patched below-the-knee popliteal artery with a running stitch of CV-6.  Prior to completing this anastomosis, all vessels were backbled.  No thrombus was noted from any vessels and backbleeding was: limited.  The bypass conduit was allowed to bleed in an antegrade fashion.  The bleeding was: pulsatile.  The anastomosis was completed in the usual fashion.  At this point, all incisions were washed out and AVitene was placed into allincisions.  The continuous doppler exam demonstrated distally: biphasic posterior tibial artery and anterior tibial artery signals.  These signals aborted with compression of the graft.    I gave the patient a total of 80 mg of Protamine.  After another round of Avitene no further active  bleeding was present.  I washed out both incisions.  The calf was repaired with interrupted deep sutures of 2-0 Vicryl to reapproximate the deep muscles and a running stitch of 3-0 Vicryl in the subcutaneous tissue.  The skin was reapproximated with a running subcuticular of 4-0 Monocryl.  After cleaning and drying the skin, the skin was reinforced with Dermabond.  Attention was turned to the groin.  The groin was repaired with a double layer of 2-0 Vicryl immediately superficial to the bypass conduit.  The superficial subcutaneous tissue was reapproximated with a double layer of 3-0 Vicryl.  The skin was reapproximated with a running subcuticular of 4-0 Monocryl.  The skin was cleaned, dried, and reinforced with Dermabond.  The vein harvest incisions were closed with a layer of 3-0 Vicryl in the subcutaneous tissue.  The skin was then reapproximated with staples.  The skin was cleaned, dried, and sterile bandages applied.    COMPLICATIONS: none  CONDITION: stable   Leonides Sake, MD [facility] [place] Office: [phone] Pager: [phone]  05/09/2017, 6:11 PM

## 2017-05-09 NOTE — Anesthesia Procedure Notes (Signed)
Procedure Name: Intubation Date/Time: 05/09/2017 12:06 PM Performed by: Elliot DallyHuggins, Laquinda Moller, CRNA Pre-anesthesia Checklist: Patient identified, Emergency Drugs available, Suction available and Patient being monitored Patient Re-evaluated:Patient Re-evaluated prior to induction Oxygen Delivery Method: Circle System Utilized Preoxygenation: Pre-oxygenation with 100% oxygen Induction Type: IV induction Ventilation: Mask ventilation without difficulty and Oral airway inserted - appropriate to patient size Laryngoscope Size: Miller and 3 Grade View: Grade I Tube type: Oral Tube size: 7.5 mm Number of attempts: 1 Airway Equipment and Method: Stylet and Oral airway Placement Confirmation: ETT inserted through vocal cords under direct vision,  positive ETCO2 and breath sounds checked- equal and bilateral Secured at: 23 cm Tube secured with: Tape Dental Injury: Teeth and Oropharynx as per pre-operative assessment

## 2017-05-09 NOTE — Anesthesia Preprocedure Evaluation (Addendum)
Anesthesia Evaluation  Patient identified by MRN, date of birth, ID band Patient awake    Reviewed: Allergy & Precautions, H&P , NPO status , Patient's Chart, lab work & pertinent test results, reviewed documented beta blocker date and time   Airway Mallampati: II  TM Distance: >3 FB Neck ROM: Full    Dental no notable dental hx. (+) Edentulous Upper, Partial Lower, Poor Dentition, Dental Advisory Given   Pulmonary Current Smoker,    Pulmonary exam normal breath sounds clear to auscultation       Cardiovascular hypertension, Pt. on medications and Pt. on home beta blockers + CAD and + Peripheral Vascular Disease  + dysrhythmias Atrial Fibrillation  Rhythm:Regular Rate:Normal     Neuro/Psych negative neurological ROS  negative psych ROS   GI/Hepatic negative GI ROS, Neg liver ROS,   Endo/Other  diabetes, Type 2, Oral Hypoglycemic Agents  Renal/GU negative Renal ROS  negative genitourinary   Musculoskeletal   Abdominal   Peds  Hematology negative hematology ROS (+)   Anesthesia Other Findings   Reproductive/Obstetrics negative OB ROS                            Anesthesia Physical Anesthesia Plan  ASA: III  Anesthesia Plan: General   Post-op Pain Management:    Induction: Intravenous  PONV Risk Score and Plan: 2 and Ondansetron, Midazolam and Treatment may vary due to age or medical condition  Airway Management Planned: Oral ETT  Additional Equipment:   Intra-op Plan:   Post-operative Plan: Extubation in OR  Informed Consent: I have reviewed the patients History and Physical, chart, labs and discussed the procedure including the risks, benefits and alternatives for the proposed anesthesia with the patient or authorized representative who has indicated his/her understanding and acceptance.   Dental advisory given  Plan Discussed with: CRNA  Anesthesia Plan Comments:          Anesthesia Quick Evaluation

## 2017-05-10 ENCOUNTER — Encounter (HOSPITAL_COMMUNITY): Payer: Self-pay | Admitting: Vascular Surgery

## 2017-05-10 ENCOUNTER — Inpatient Hospital Stay (HOSPITAL_COMMUNITY): Payer: BLUE CROSS/BLUE SHIELD

## 2017-05-10 ENCOUNTER — Other Ambulatory Visit: Payer: Self-pay

## 2017-05-10 DIAGNOSIS — Z9889 Other specified postprocedural states: Secondary | ICD-10-CM

## 2017-05-10 LAB — CBC
HCT: 40.4 % (ref 39.0–52.0)
HCT: 41.8 % (ref 39.0–52.0)
Hemoglobin: 12.9 g/dL — ABNORMAL LOW (ref 13.0–17.0)
Hemoglobin: 13.8 g/dL (ref 13.0–17.0)
MCH: 28.9 pg (ref 26.0–34.0)
MCH: 29.8 pg (ref 26.0–34.0)
MCHC: 31.9 g/dL (ref 30.0–36.0)
MCHC: 33 g/dL (ref 30.0–36.0)
MCV: 90.6 fL (ref 78.0–100.0)
MCV: 90.3 fL (ref 78.0–100.0)
Platelets: 179 10*3/uL (ref 150–400)
Platelets: 158 10*3/uL (ref 150–400)
RBC: 4.46 MIL/uL (ref 4.22–5.81)
RBC: 4.63 MIL/uL (ref 4.22–5.81)
RDW: 14.1 % (ref 11.5–15.5)
RDW: 14.2 % (ref 11.5–15.5)
WBC: 20.4 10*3/uL — ABNORMAL HIGH (ref 4.0–10.5)
WBC: 22.1 10*3/uL — ABNORMAL HIGH (ref 4.0–10.5)

## 2017-05-10 LAB — BASIC METABOLIC PANEL
Anion gap: 10 (ref 5–15)
BUN: 11 mg/dL (ref 6–20)
CO2: 24 mmol/L (ref 22–32)
Calcium: 8.6 mg/dL — ABNORMAL LOW (ref 8.9–10.3)
Chloride: 101 mmol/L (ref 101–111)
Creatinine, Ser: 1.03 mg/dL (ref 0.61–1.24)
GFR calc Af Amer: >60 mL/min (ref >60)
GFR calc non Af Amer: >60 mL/min (ref >60)
Glucose, Bld: 228 mg/dL — ABNORMAL HIGH (ref 65–99)
Potassium: 4.2 mmol/L (ref 3.5–5.1)
Sodium: 135 mmol/L (ref 135–145)

## 2017-05-10 LAB — GLUCOSE, CAPILLARY
Glucose-Capillary: 224 mg/dL — ABNORMAL HIGH (ref 65–99)
Glucose-Capillary: 258 mg/dL — ABNORMAL HIGH (ref 65–99)
Glucose-Capillary: 130 mg/dL — ABNORMAL HIGH (ref 65–99)

## 2017-05-10 LAB — HEPARIN LEVEL (UNFRACTIONATED)
Heparin Unfractionated: <0.1 IU/mL — ABNORMAL LOW (ref 0.30–0.70)
Heparin Unfractionated: <0.1 IU/mL — ABNORMAL LOW (ref 0.30–0.70)

## 2017-05-10 LAB — HEMOGLOBIN A1C
Hgb A1c MFr Bld: 7.3 % — ABNORMAL HIGH (ref 4.8–5.6)
Mean Plasma Glucose: 162.81 mg/dL

## 2017-05-10 MED ORDER — PANTOPRAZOLE SODIUM 40 MG PO TBEC
40.0000 mg | DELAYED_RELEASE_TABLET | Freq: Every day | ORAL | Status: DC
  Administered 2017-05-10 – 2017-05-12 (×3): 40 mg via ORAL
  Filled 2017-05-10 (×3): qty 1

## 2017-05-10 MED ORDER — CLOPIDOGREL BISULFATE 75 MG PO TABS
75.0000 mg | ORAL_TABLET | Freq: Every day | ORAL | Status: DC
  Administered 2017-05-10 – 2017-05-12 (×3): 75 mg via ORAL
  Filled 2017-05-10 (×3): qty 1

## 2017-05-10 MED ORDER — ACETAMINOPHEN 325 MG PO TABS: 325.0000 mg | ORAL_TABLET | ORAL | Status: DC | PRN

## 2017-05-10 MED ORDER — LEVOTHYROXINE SODIUM 25 MCG PO TABS
25.0000 ug | ORAL_TABLET | Freq: Every day | ORAL | Status: DC
  Administered 2017-05-10 – 2017-05-12 (×3): 25 ug via ORAL
  Filled 2017-05-10 (×3): qty 1

## 2017-05-10 MED ORDER — LINAGLIPTIN 5 MG PO TABS
5.0000 mg | ORAL_TABLET | Freq: Every day | ORAL | Status: DC
  Administered 2017-05-10 – 2017-05-12 (×3): 5 mg via ORAL
  Filled 2017-05-10 (×4): qty 1

## 2017-05-10 MED ORDER — ATORVASTATIN CALCIUM 10 MG PO TABS
10.0000 mg | ORAL_TABLET | Freq: Every day | ORAL | Status: DC
  Administered 2017-05-10 – 2017-05-12 (×3): 10 mg via ORAL
  Filled 2017-05-10 (×3): qty 1

## 2017-05-10 MED ORDER — HYDRALAZINE HCL 20 MG/ML IJ SOLN: 5.0000 mg | INTRAMUSCULAR | Status: DC | PRN

## 2017-05-10 MED ORDER — METOPROLOL TARTRATE 5 MG/5ML IV SOLN: 2.0000 mg | INTRAVENOUS | Status: DC | PRN

## 2017-05-10 MED ORDER — HYDROCODONE-ACETAMINOPHEN 5-325 MG PO TABS
1.0000 | ORAL_TABLET | ORAL | Status: DC | PRN
  Administered 2017-05-10: 2 via ORAL
  Administered 2017-05-10: 1 via ORAL
  Administered 2017-05-10 – 2017-05-12 (×5): 2 via ORAL
  Filled 2017-05-10 (×7): qty 2

## 2017-05-10 MED ORDER — BISACODYL 5 MG PO TBEC
5.0000 mg | DELAYED_RELEASE_TABLET | Freq: Every day | ORAL | Status: DC | PRN
Start: 2017-05-10 — End: 2017-05-12

## 2017-05-10 MED ORDER — SODIUM CHLORIDE 0.9 % IV SOLN: 500.0000 mL | Freq: Once | INTRAVENOUS | Status: DC | PRN

## 2017-05-10 MED ORDER — GLIMEPIRIDE 4 MG PO TABS
4.0000 mg | ORAL_TABLET | Freq: Every day | ORAL | Status: DC
  Administered 2017-05-10 – 2017-05-12 (×3): 4 mg via ORAL
  Filled 2017-05-10 (×3): qty 1

## 2017-05-10 MED ORDER — HEPARIN (PORCINE) IN NACL 100-0.45 UNIT/ML-% IJ SOLN
1700.0000 [IU]/h | INTRAMUSCULAR | Status: DC
  Administered 2017-05-10: 1200 [IU]/h via INTRAVENOUS
  Administered 2017-05-11: 1400 [IU]/h via INTRAVENOUS
  Filled 2017-05-10 (×2): qty 250

## 2017-05-10 MED ORDER — LABETALOL HCL 5 MG/ML IV SOLN: 10.0000 mg | INTRAVENOUS | Status: DC | PRN

## 2017-05-10 MED ORDER — MAGNESIUM SULFATE 2 GM/50ML IV SOLN: 2.0000 g | Freq: Every day | INTRAVENOUS | Status: DC | PRN

## 2017-05-10 MED ORDER — INSULIN ASPART 100 UNIT/ML ~~LOC~~ SOLN
0.0000 [IU] | Freq: Three times a day (TID) | SUBCUTANEOUS | Status: DC
  Administered 2017-05-10: 3 [IU] via SUBCUTANEOUS
  Administered 2017-05-10: 7 [IU] via SUBCUTANEOUS
  Administered 2017-05-10: 1 [IU] via SUBCUTANEOUS
  Administered 2017-05-11 (×2): 2 [IU] via SUBCUTANEOUS
  Administered 2017-05-12: 1 [IU] via SUBCUTANEOUS
  Administered 2017-05-12: 2 [IU] via SUBCUTANEOUS

## 2017-05-10 MED ORDER — GUAIFENESIN-DM 100-10 MG/5ML PO SYRP: 15.0000 mL | ORAL_SOLUTION | ORAL | Status: DC | PRN

## 2017-05-10 MED ORDER — BENAZEPRIL HCL 5 MG PO TABS
10.0000 mg | ORAL_TABLET | Freq: Every day | ORAL | Status: DC
  Administered 2017-05-10 – 2017-05-12 (×3): 10 mg via ORAL
  Filled 2017-05-10 (×3): qty 2

## 2017-05-10 MED ORDER — SITAGLIPTIN PHOS-METFORMIN HCL 50-1000 MG PO TABS: 1.0000 | ORAL_TABLET | Freq: Two times a day (BID) | ORAL | Status: DC

## 2017-05-10 MED ORDER — ONDANSETRON HCL 4 MG/2ML IJ SOLN: 4.0000 mg | Freq: Four times a day (QID) | INTRAMUSCULAR | Status: DC | PRN

## 2017-05-10 MED ORDER — GABAPENTIN 300 MG PO CAPS
300.0000 mg | ORAL_CAPSULE | Freq: Three times a day (TID) | ORAL | Status: DC
  Administered 2017-05-10 – 2017-05-12 (×6): 300 mg via ORAL
  Filled 2017-05-10 (×6): qty 1

## 2017-05-10 MED ORDER — METFORMIN HCL 500 MG PO TABS
1000.0000 mg | ORAL_TABLET | Freq: Two times a day (BID) | ORAL | Status: DC
  Administered 2017-05-10 – 2017-05-12 (×5): 1000 mg via ORAL
  Filled 2017-05-10 (×5): qty 2

## 2017-05-10 MED ORDER — POTASSIUM CHLORIDE CRYS ER 20 MEQ PO TBCR: 20.0000 meq | EXTENDED_RELEASE_TABLET | Freq: Every day | ORAL | Status: DC | PRN

## 2017-05-10 MED ORDER — PHENOL 1.4 % MT LIQD: 1.0000 | OROMUCOSAL | Status: DC | PRN

## 2017-05-10 MED ORDER — DOCUSATE SODIUM 100 MG PO CAPS
100.0000 mg | ORAL_CAPSULE | Freq: Every day | ORAL | Status: DC
  Administered 2017-05-10 – 2017-05-12 (×2): 100 mg via ORAL
  Filled 2017-05-10 (×3): qty 1

## 2017-05-10 MED ORDER — ALUM & MAG HYDROXIDE-SIMETH 200-200-20 MG/5ML PO SUSP: 15.0000 mL | ORAL | Status: DC | PRN

## 2017-05-10 MED ORDER — CEFAZOLIN SODIUM-DEXTROSE 2-4 GM/100ML-% IV SOLN
2.0000 g | Freq: Three times a day (TID) | INTRAVENOUS | Status: AC
  Administered 2017-05-10 (×2): 2 g via INTRAVENOUS
  Filled 2017-05-10 (×2): qty 100

## 2017-05-10 MED ORDER — METOPROLOL TARTRATE 25 MG PO TABS
25.0000 mg | ORAL_TABLET | Freq: Two times a day (BID) | ORAL | Status: DC
  Administered 2017-05-10 – 2017-05-12 (×6): 25 mg via ORAL
  Filled 2017-05-10 (×6): qty 1

## 2017-05-10 MED ORDER — MORPHINE SULFATE (PF) 2 MG/ML IV SOLN: 1.0000 mg | INTRAVENOUS | Status: DC | PRN

## 2017-05-10 MED ORDER — SENNOSIDES-DOCUSATE SODIUM 8.6-50 MG PO TABS: 1.0000 | ORAL_TABLET | Freq: Every evening | ORAL | Status: DC | PRN

## 2017-05-10 MED ORDER — ENOXAPARIN SODIUM 30 MG/0.3ML ~~LOC~~ SOLN: 30.0000 mg | SUBCUTANEOUS | Status: DC

## 2017-05-10 MED ORDER — PREGABALIN 50 MG PO CAPS
50.0000 mg | ORAL_CAPSULE | Freq: Three times a day (TID) | ORAL | Status: DC
  Administered 2017-05-10 – 2017-05-12 (×6): 50 mg via ORAL
  Filled 2017-05-10 (×6): qty 1

## 2017-05-10 MED ORDER — ACETAMINOPHEN 325 MG RE SUPP: 325.0000 mg | RECTAL | Status: DC | PRN

## 2017-05-10 NOTE — Evaluation (Signed)
Physical Therapy Evaluation Patient Details Name: Willie Morales MRN: [id number] DOB: 08/19/1960 Today's Date: 05/10/2017   History of Present Illness  Pt is a 57 y.o. male s/p L iliofemoral endarterectomy and L fem-popliteal artery bypass graft on 05/09/17. PMH includes a-fib, HTN, DM, CAD, tobacco abuse.    Clinical Impression  Pt presents with an overall decrease in functional mobility secondary to above. PTA, pt indep and lives with family available for 24/7 support. Today, pt limited by significant LLE pain; unable to WB fully through LLE and reliant on UE support to offload. Amb 100' with RW and supervision balance; encouraged to increased WBAT as able. Expect pt to progress well with mobility once pain is increased. Pt would benefit from continued acute PT services to maximize functional mobility and independence prior to d/c home.     Follow Up Recommendations No PT follow up;Supervision for mobility/OOB    Equipment Recommendations  Rolling walker with 5" wheels    Recommendations for Other Services OT consult     Precautions / Restrictions Precautions Precautions: Fall Restrictions Weight Bearing Restrictions: No      Mobility  Bed Mobility Overal bed mobility: Independent             General bed mobility comments: Increased effort secondary to LLE discomfort  Transfers Overall transfer level: Needs assistance Equipment used: 1 person hand held assist;Rolling walker (2 wheeled) Transfers: Sit to/from Stand Sit to Stand: Supervision;Min guard         General transfer comment: Able to stand from low bed height on 3rd attempt, reliant on momentum and reaching to sink for UE support; min guard for balance. Sit<>stand with RW and supervision  Ambulation/Gait Ambulation/Gait assistance: Supervision;Min guard Ambulation Distance (Feet): 100 Feet Assistive device: Rolling walker (2 wheeled) Gait Pattern/deviations: Step-to pattern;Antalgic Gait velocity:  Decreased Gait velocity interpretation: <1.8 ft/sec, indicative of risk for recurrent falls General Gait Details: Slow, controlled ambulation with RW and min guard, progressing to supervision for safety. Decreased tolerance for WB through LLE due to pain, utilizing a TDWB "hop-to" gait pattern. Encouraged to increase WBAT through LLE  Stairs Stairs: (Declined; reports "I'll make it workConservation officer, historic buildings")          Wheelchair Mobility    Modified Rankin (Stroke Patients Only)       Balance Overall balance assessment: Needs assistance   Sitting balance-Leahy Scale: Good Sitting balance - Comments: Attempted to don L sock sitting EOB, but unable due to decreased L knee ROM     Standing balance-Leahy Scale: Poor Standing balance comment: Reliant on UE support to offload LLE                             Pertinent Vitals/Pain Pain Assessment: Faces Faces Pain Scale: Hurts whole lot Pain Location: LLE with mobility/ROM Pain Descriptors / Indicators: Grimacing;Guarding Pain Intervention(s): Monitored during session;Limited activity within patient's tolerance    Home Living Family/patient expects to be discharged to:: Private residence Living Arrangements: Spouse/significant other Available Help at Discharge: Family;Available 24 hours/day Type of Home: House Home Access: Stairs to enter Entrance Stairs-Rails: Right Entrance Stairs-Number of Steps: 3 Home Layout: One level Home Equipment: None      Prior Function Level of Independence: Independent               Hand Dominance        Extremity/Trunk Assessment   Upper Extremity Assessment Upper Extremity Assessment: Overall WFL for tasks assessed  Lower Extremity Assessment Lower Extremity Assessment: LLE deficits/detail LLE Deficits / Details: Knee flexion AROM grossly 15-70' due to pain and stiffness at incision site LLE: Unable to fully assess due to pain LLE Coordination: decreased gross motor     Cervical / Trunk Assessment Cervical / Trunk Assessment: Normal  Communication   Communication: No difficulties  Cognition Arousal/Alertness: Awake/alert Behavior During Therapy: WFL for tasks assessed/performed Overall Cognitive Status: Within Functional Limits for tasks assessed                                        General Comments      Exercises     Assessment/Plan    PT Assessment Patient needs continued PT services  PT Problem List Decreased range of motion;Decreased activity tolerance;Decreased balance;Decreased mobility;Pain       PT Treatment Interventions DME instruction;Gait training;Stair training;Functional mobility training;Therapeutic activities;Therapeutic exercise;Balance training;Patient/family education    PT Goals (Current goals can be found in the Care Plan section)  Acute Rehab PT Goals Patient Stated Goal: Return home; decreased pain PT Goal Formulation: With patient Time For Goal Achievement: 05/24/17 Potential to Achieve Goals: Good    Frequency Min 3X/week   Barriers to discharge        Co-evaluation               AM-PAC PT "6 Clicks" Daily Activity  Outcome Measure Difficulty turning over in bed (including adjusting bedclothes, sheets and blankets)?: None Difficulty moving from lying on back to sitting on the side of the bed? : None Difficulty sitting down on and standing up from a chair with arms (e.g., wheelchair, bedside commode, etc,.)?: A Little Help needed moving to and from a bed to chair (including a wheelchair)?: A Little Help needed walking in hospital room?: A Little Help needed climbing 3-5 steps with a railing? : A Little 6 Click Score: 20    End of Session Equipment Utilized During Treatment: Gait belt Activity Tolerance: Patient limited by pain Patient left: in chair;with call bell/phone within reach;with nursing/sitter in room Nurse Communication: Mobility status PT Visit Diagnosis: Other  abnormalities of gait and mobility (R26.89)    Time: 1610-9604 PT Time Calculation (min) (ACUTE ONLY): 20 min   Charges:   PT Evaluation $PT Eval Moderate Complexity: 1 Mod     PT G Codes:       Willie Morales, PT, DPT Acute Rehab Services  Pager: [phone]  Malachy Chamber 05/10/2017, 8:40 AM

## 2017-05-10 NOTE — Evaluation (Signed)
Occupational Therapy Evaluation Patient Details Name: Willie CarmelGeorge A Morales MRN: [id number] DOB: 02/25/1960 Today's Date: 05/10/2017    History of Present Illness Pt is a 57 y.o. male s/p L iliofemoral endarterectomy and L fem-popliteal artery bypass graft on 05/09/17. PMH includes a-fib, HTN, DM, CAD, tobacco abuse.   Clinical Impression   This 57 y/o M presents with the above. At baseline is independent with ADLs and functional mobility. Pt presenting with pain in LLE, decreased dynamic balance. Pt completing room level functional mobility, toileting and standing grooming ADLs using RW with overall minguard assist. Pt overall maintaining TDWB in LLE during mobility due to pain, though with no significant LOB. Pt will benefit from continued acute OT services to maximize his overall safety and independence with ADLs and functional mobility prior to discharge home. Anticipate pt will not need follow-up OT services after discharge.     Follow Up Recommendations  No OT follow up;Supervision/Assistance - 24 hour(24 hr initially )    Equipment Recommendations  None recommended by OT;Other (comment)(pt declining shower seat )           Precautions / Restrictions Precautions Precautions: Fall Restrictions Weight Bearing Restrictions: No      Mobility Bed Mobility Overal bed mobility: Independent             General bed mobility comments: Increased effort secondary to LLE discomfort  Transfers Overall transfer level: Needs assistance Equipment used: Rolling walker (2 wheeled) Transfers: Sit to/from Stand Sit to Stand: Min guard         General transfer comment: minguard for safety; verbal cues for UE placement, stood from EOB     Balance Overall balance assessment: Needs assistance   Sitting balance-Leahy Scale: Good       Standing balance-Leahy Scale: Fair Standing balance comment: pt able to static stand at sink to complete grooming ADLs with minguard for safety                            ADL either performed or assessed with clinical judgement   ADL Overall ADL's : Needs assistance/impaired Eating/Feeding: Modified independent;Sitting   Grooming: Wash/dry face;Oral care;Min guard;Standing   Upper Body Bathing: Set up;Sitting   Lower Body Bathing: Min guard;Sit to/from stand   Upper Body Dressing : Set up;Sitting   Lower Body Dressing: Minimal assistance;Sit to/from stand Lower Body Dressing Details (indicate cue type and reason): pt with increased difficulty reaching towards feet, reports he was able to don socks earlier with increased time/effort  Toilet Transfer: Min guard;Ambulation;RW;Regular Social workerToilet   Toileting- Clothing Manipulation and Hygiene: Min guard;Sit to/from stand Toileting - Clothing Manipulation Details (indicate cue type and reason): pt standing to void bladder in toilet with minguard for safety      Functional mobility during ADLs: Min guard;Rolling walker General ADL Comments: pt completing toileting and standing grooming ADLs with overall minguard using RW; mostly maintaining TDWB in LLE due to pain but with no significant LOB; discussed use of shower chair during task completion, pt currently declining reports feeling comfortable completing task in standing (reports has been dealing with pain in LLE for a while)                          Pertinent Vitals/Pain Pain Assessment: Faces Faces Pain Scale: Hurts even more Pain Location: LLE with mobility Pain Descriptors / Indicators: Grimacing;Guarding Pain Intervention(s): Monitored during session;Repositioned  Extremity/Trunk Assessment Upper Extremity Assessment Upper Extremity Assessment: Overall WFL for tasks assessed   Lower Extremity Assessment Lower Extremity Assessment: Defer to PT evaluation   Cervical / Trunk Assessment Cervical / Trunk Assessment: Normal   Communication Communication Communication: No difficulties   Cognition  Arousal/Alertness: Awake/alert Behavior During Therapy: WFL for tasks assessed/performed Overall Cognitive Status: Within Functional Limits for tasks assessed                                                      Home Living Family/patient expects to be discharged to:: Private residence Living Arrangements: Spouse/significant other Available Help at Discharge: Family;Available 24 hours/day Type of Home: House Home Access: Stairs to enter [employer] of Steps: 3 Entrance Stairs-Rails: Right Home Layout: One level     Bathroom Shower/Tub: Tub/shower unit;Walk-in shower   Bathroom Toilet: Standard     Home Equipment: None          Prior Functioning/Environment Level of Independence: Independent                 OT Problem List: Decreased strength;Impaired balance (sitting and/or standing);Pain;Decreased range of motion;Decreased activity tolerance      OT Treatment/Interventions: Self-care/ADL training;DME and/or AE instruction;Therapeutic activities;Balance training;Therapeutic exercise;Energy conservation;Patient/family education    OT Goals(Current goals can be found in the care plan section) Acute Rehab OT Goals Patient Stated Goal: Return home; decreased pain OT Goal Formulation: With patient Time For Goal Achievement: 05/24/17 Potential to Achieve Goals: Good  OT Frequency: Min 2X/week                             AM-PAC PT "6 Clicks" Daily Activity     Outcome Measure Help from another person eating meals?: None Help from another person taking care of personal grooming?: A Little Help from another person toileting, which includes using toliet, bedpan, or urinal?: A Little Help from another person bathing (including washing, rinsing, drying)?: A Little Help from another person to put on and taking off regular upper body clothing?: None Help from another person to put on and taking off regular lower body clothing?: A  Lot 6 Click Score: 19   End of Session Equipment Utilized During Treatment: Gait belt;Rolling walker Nurse Communication: Mobility status  Activity Tolerance: Patient tolerated treatment well Patient left: in bed;with call bell/phone within reach;with bed alarm set  OT Visit Diagnosis: Unsteadiness on feet (R26.81);Pain Pain - Right/Left: Left Pain - part of body: Leg                Time: 1610-9604 OT Time Calculation (min): 17 min Charges:  OT General Charges $OT Visit: 1 Visit OT Evaluation $OT Eval Moderate Complexity: 1 Mod G-Codes:     Marcy Siren, OT Pager [phone] 05/10/2017   Orlando Penner 05/10/2017, 3:49 PM

## 2017-05-10 NOTE — Progress Notes (Signed)
   Daily Progress Note   Assessment/Planning:   POD #1 s/p L iliofemoral EA w/ DPA, BPA BTK pop, L CFA to BK pop BPG w/ Propaten   No evidence of bleeding currently  Heparin gtt for now for afib.  If no further bleeding by beginning of week, will restart Eliquis  PT/OT ambulation  BLE ABI today   Subjective  - 1 Day Post-Op   L foot greatly improved, no rest pain, surgical pain tolerable   Objective   Vitals:   05/09/17 2300 05/09/17 2333 05/10/17 0300 05/10/17 0745  BP: 127/78 (!) 144/77 (!) 148/90 128/89  Pulse: 97 68  80  Resp: 19 16    Temp: 99.2 F (37.3 C) 98.3 F (36.8 C) 98.2 F (36.8 C) 98.7 F (37.1 C)  TempSrc:  Oral Oral Oral  SpO2: 100% 100%    Weight:  178 lb 5.6 oz (80.9 kg)    Height:  6' (1.829 m)       Intake/Output Summary (Last 24 hours) at 05/10/2017 0808 Last data filed at 05/10/2017 [id number] Gross per 24 hour  Intake 2500 ml  Output 1650 ml  Net 850 ml    PULM  BLL rales  CV  RRR  GI  soft, NTND  VASC All incision c/d/i, vein harvest bandaged without active bleeding, palpable DP  NEURO B foot motor intact, decreased sensation in R foot    Laboratory   CBC CBC Latest Ref Rng & Units 05/10/2017 05/09/2017 05/07/2017  WBC 4.0 - 10.5 K/uL 20.4(H) 13.4(H) 15.0(H)  Hemoglobin 13.0 - 17.0 g/dL 12.9(L) 15.8 14.3  Hematocrit 39.0 - 52.0 % 40.4 46.2 43.9  Platelets 150 - 400 K/uL 179 187 213    BMET    Component Value Date/Time   NA 135 05/10/2017 0018   K 4.2 05/10/2017 0018   CL 101 05/10/2017 0018   CO2 24 05/10/2017 0018   GLUCOSE 228 (H) 05/10/2017 0018   BUN 11 05/10/2017 0018   CREATININE 1.03 05/10/2017 0018   CALCIUM 8.6 (L) 05/10/2017 0018   GFRNONAA >60 05/10/2017 0018   GFRAA >60 05/10/2017 0018     Leonides SakeBrian Oswald Pott, MD, FACS [facility] Office: [phone] Pager: [phone]  05/10/2017, 8:08 AM

## 2017-05-10 NOTE — Progress Notes (Signed)
OT Cancellation Note  Patient Details Name: Willie Morales MRN: [id number] DOB: 08/24/1960   Cancelled Treatment:    Reason Eval/Treat Not Completed: Patient at procedure or test/ unavailable; will follow up as schedule allows and as pt is back on unit.   Willie Morales, OT Pager [phone] 05/10/2017   Willie Morales 05/10/2017, 1:54 PM

## 2017-05-10 NOTE — Progress Notes (Signed)
Post Op ABIs and TBIs completed. Right- ABI 0.74 TBI 0.56 Left ABI 0.91 TBI 0.44. [place] [employer] Trinetta Alemu, RVS 05/10/2017 4:02 PM

## 2017-05-10 NOTE — Progress Notes (Signed)
ANTICOAGULATION CONSULT NOTE - Initial Consult  Pharmacy Consult for heparin Indication: atrial fibrillation  Allergies  Allergen Reactions  . Ciprofloxacin Rash    Patient Measurements: Height: 6' (182.9 cm) Weight: 178 lb 5.6 oz (80.9 kg) IBW/kg (Calculated) : 77.6  Vital Signs: Temp: 98.7 F (37.1 C) (03/22 0745) Temp Source: Oral (03/22 0745) BP: 128/89 (03/22 0745) Pulse Rate: 84 (03/22 1142)  Labs: Recent Labs    05/09/17 0953 05/10/17 0018 05/10/17 0923 05/10/17 1637  HGB 15.8 12.9* 13.8  --   HCT 46.2 40.4 41.8  --   PLT 187 179 158  --   APTT 30  --   --   --   LABPROT 14.2  --   --   --   INR 1.11  --   --   --   HEPARINUNFRC  --   --  <0.10* <0.10*  CREATININE 0.98 1.03  --   --     Estimated Creatinine Clearance: 88.9 mL/min (by C-G formula based on SCr of 1.03 mg/dL).   Medical History: Past Medical History:  Diagnosis Date  . Coronary artery disease   . Diabetes mellitus   . Dyslipidemia   . History of kidney stones   . Hypertension   . Paroxysmal atrial fibrillation (HCC) 12/2010  . Tobacco abuse     Assessment: 57yo male on Eliquis PTA for Afib, now s/p endarterectomy and fem-pop bypass, on IV heparin for anticoagulation. per Dr. Nicky Pughhen's note, will restart Eliquis the begging of next week if no bleeding.  Heparin level undetectable on 1200 units/hr  Goal of Therapy:  Heparin level 0.3-0.7 units/ml Monitor platelets by anticoagulation protocol: Yes   Plan:  Increase heparin rate to 1400 units/hr F/u AM labs Daily heparin level and CBC F/u plans for restarting Eliquis  Bayard HuggerMei Keymiah Lyles, PharmD, BCPS  Clinical Pharmacist  Pager: [phone]    05/10/2017,5:33 PM

## 2017-05-10 NOTE — Discharge Instructions (Signed)
 [facility]  Discharge instructions  Lower Extremity Bypass Surgery  Please refer to the following instruction for your post-procedure care. Your surgeon or physician assistant will discuss any changes with you.  Activity  You are encouraged to walk as much as you can. You can slowly return to normal activities during the month after your surgery. Avoid strenuous activity and heavy lifting until your doctor tells you it's OK. Avoid activities such as vacuuming or swinging a golf club. Do not drive until your doctor give the OK and you are no longer taking prescription pain medications. It is also normal to have difficulty with sleep habits, eating and bowel movement after surgery. These will go away with time.  Bathing/Showering  Shower daily after you go home. Do not soak in a bathtub, hot tub, or swim until the incision heals completely.  Incision Care  Clean your incision with mild soap and water. Shower every day. Pat the area dry with a clean towel. You do not need a bandage unless otherwise instructed. Do not apply any ointments or creams to your incision. If you have open wounds you will be instructed how to care for them or a visiting nurse may be arranged for you. If you have staples or sutures along your incision they will be removed at your post-op appointment. You may have skin glue on your incision. Do not peel it off. It will come off on its own in about one week.  Wash the groin wound with soap and water daily and pat dry. (No tub bath-only shower)  Then put a dry gauze or washcloth in the groin to keep this area dry to help prevent wound infection.  Do this daily and as needed.  Do not use Vaseline or neosporin on your incisions.  Only use soap and water on your incisions and then protect and keep dry.  Diet  Resume your normal diet. There are no special food restrictions following this procedure. A low fat/ low cholesterol diet is  recommended for all patients with vascular disease. In order to heal from your surgery, it is CRITICAL to get adequate nutrition. Your body requires vitamins, minerals, and protein. Vegetables are the best source of vitamins and minerals. Vegetables also provide the perfect balance of protein. Processed food has little nutritional value, so try to avoid this.  Medications  Resume taking all your medications unless your doctor or physician assistant tells you not to. If your incision is causing pain, you may take over-the-counter pain relievers such as acetaminophen (Tylenol). If you were prescribed a stronger pain medication, please aware these medication can cause nausea and constipation. Prevent nausea by taking the medication with a snack or meal. Avoid constipation by drinking plenty of fluids and eating foods with high amount of fiber, such as fruits, vegetables, and grains. Take Colace 100 mg (an over-the-counter stool softener) twice a day as needed for constipation.  Do not take Tylenol if you are taking prescription pain medications.  Follow Up  Our office will schedule a follow up appointment 2-3 weeks following discharge.  Please call us immediately for any of the following conditions  Severe or worsening pain in your legs or feet while at rest or while walking Increase pain, redness, warmth, or drainage (pus) from your incision site(s) Fever of 101 degree or higher The swelling in your leg with the bypass suddenly worsens and becomes more painful than when you were in the hospital If you have   been instructed to feel your graft pulse then you should do so every day. If you can no longer feel this pulse, call the office immediately. Not all patients are given this instruction.  Leg swelling is common after leg bypass surgery.  The swelling should improve over a few months following surgery. To improve the swelling, you may elevate your legs above the level of your heart while you are  sitting or resting. Your surgeon or physician assistant may ask you to apply an ACE wrap or wear compression (TED) stockings to help to reduce swelling.  Reduce your risk of vascular disease  Stop smoking. If you would like help call [facility] at 1-800-QUIT-NOW (1-800-784-8669) or [place] at 336-586-4000.  Manage your cholesterol Maintain a desired weight Control your diabetes weight Control your diabetes Keep your blood pressure down  If you have any questions, please call the office at [phone]  

## 2017-05-10 NOTE — Care Management Note (Signed)
Case Management Note Donn PieriniKristi Darrall Strey RN, BSN Unit 4E-Case Manager [phone]  Patient Details  Name: Willie Morales MRN: [id number] Date of Birth: 02/22/1960  Subjective/Objective:   Pt admitted s/p L iliofemoral EA w/ DPA, BPA BTK pop, L CFA to BK pop BPG w/ Propaten                 Action/Plan: PTA pt lived at home with family who will be able to assist 24/7. [facility] will need a RW for home- order has been placed- [facility]- RW will need to be delivered to room prior to discharge.  CM to follow for any further transition of care needs  Expected Discharge Date:                  Expected Discharge Plan:  Home/Self Care  In-House Referral:  NA  Discharge planning Services  CM Consult  Post Acute Care Choice:  Durable Medical Equipment Choice offered to:  Patient  DME Arranged:  Walker rolling DME Agency:  [facility]  [facility]    [facility]     Status of Service:  In process, will continue to follow  If discussed at Long Length of Stay Meetings, dates discussed:    Discharge Disposition:   Additional Comments:  Darrold SpanWebster, Clotiel Troop Hall, RN 05/10/2017, 3:47 PM

## 2017-05-10 NOTE — Progress Notes (Signed)
ANTICOAGULATION CONSULT NOTE - Initial Consult  Pharmacy Consult for heparin Indication: atrial fibrillation  Allergies  Allergen Reactions  . Ciprofloxacin Rash    Patient Measurements: Height: 6' (182.9 cm) Weight: 178 lb 5.6 oz (80.9 kg) IBW/kg (Calculated) : 77.6  Vital Signs: Temp: 98.3 F (36.8 C) (03/21 2333) Temp Source: Oral (03/21 2333) BP: 144/77 (03/21 2333) Pulse Rate: 68 (03/21 2333)  Labs: Recent Labs    05/07/17 0243 05/09/17 0953 05/10/17 0018  HGB 14.3 15.8 12.9*  HCT 43.9 46.2 40.4  PLT 213 187 179  APTT  --  30  --   LABPROT  --  14.2  --   INR  --  1.11  --   CREATININE 0.93 0.98 1.03    Estimated Creatinine Clearance: 88.9 mL/min (by C-G formula based on SCr of 1.03 mg/dL).   Medical History: Past Medical History:  Diagnosis Date  . Coronary artery disease   . Diabetes mellitus   . Dyslipidemia   . History of kidney stones   . Hypertension   . Paroxysmal atrial fibrillation (HCC) 12/2010  . Tobacco abuse     Medications:  Medications Prior to Admission  Medication Sig Dispense Refill Last Dose  . atorvastatin (LIPITOR) 10 MG tablet Take 10 mg by mouth daily.   1 Past Week at Unknown time  . benazepril (LOTENSIN) 10 MG tablet Take 1 tablet (10 mg total) by mouth daily. 30 tablet 6 Past Week at Unknown time  . gabapentin (NEURONTIN) 300 MG capsule Take 1 capsule (300 mg total) by mouth 3 (three) times daily. (Patient taking differently: Take 300 mg by mouth daily. ) 90 capsule 11 Past Week at Unknown time  . glimepiride (AMARYL) 4 MG tablet Take 1 tablet (4 mg total) by mouth daily with breakfast. 30 tablet 6 05/08/2017 at Unknown time  . HYDROcodone-acetaminophen (NORCO/VICODIN) 5-325 MG tablet Take one-two tabs po q 4-6 hrs prn pain (Patient taking differently: Take 1 tablet by mouth at bedtime as needed for moderate pain. ) 15 tablet 0 Past Month at Unknown time  . levothyroxine (SYNTHROID, LEVOTHROID) 25 MCG tablet Take 25 mcg by  mouth daily before breakfast.    05/08/2017 at Unknown time  . metoprolol tartrate (LOPRESSOR) 25 MG tablet Take 1 tablet (25 mg total) by mouth 2 (two) times daily. 60 tablet 6 05/08/2017 at Unknown time  . sitaGLIPtin-metformin (JANUMET) 50-1000 MG tablet Take 1 tablet by mouth 2 (two) times daily.   Past Week at Unknown time  . apixaban (ELIQUIS) 5 MG TABS tablet Take 1 tablet (5 mg total) by mouth 2 (two) times daily. 60 tablet 6 05/02/2017  . clopidogrel (PLAVIX) 75 MG tablet Take 1 tablet (75 mg total) by mouth daily. 30 tablet 11 05/02/2017  . pregabalin (LYRICA) 50 MG capsule Take 1 capsule (50 mg total) by mouth 3 (three) times daily. (Patient not taking: Reported on 05/01/2017) 90 capsule 2 Unknown at Unknown time   Scheduled:  . atorvastatin  10 mg Oral Daily  . benazepril  10 mg Oral Daily  . clopidogrel  75 mg Oral Daily  . docusate sodium  100 mg Oral Daily  . enoxaparin (LOVENOX) injection  30 mg Subcutaneous Q24H  . gabapentin  300 mg Oral TID  . glimepiride  4 mg Oral Q breakfast  . HYDROmorphone      . insulin aspart  0-9 Units Subcutaneous TID WC  . levothyroxine  25 mcg Oral QAC breakfast  . linagliptin  5 mg  Oral Daily  . meperidine      . metFORMIN  1,000 mg Oral BID WC  . metoprolol tartrate  25 mg Oral BID  . pantoprazole  40 mg Oral Daily  . pregabalin  50 mg Oral TID   Infusions:  . sodium chloride    . sodium chloride 125 mL/hr at 05/09/17 1909  .  ceFAZolin (ANCEF) IV 2 g (05/10/17 0044)  . lactated ringers 10 mL/hr at 05/09/17 0958  . magnesium sulfate 1 - 4 g bolus IVPB      Assessment: 57yo male on Eliquis PTA for Afib, now s/p endarterectomy and fem-pop bypass, to start heparin bridge ~12h after surgery; per med hx pt last took Eliquis 3/14 so anti-Xa should no longer be affected.  Goal of Therapy:  Heparin level 0.3-0.7 units/ml Monitor platelets by anticoagulation protocol: Yes   Plan:  At 8a will start heparin gtt at 1200 units/hr and monitor  heparin levels and CBC.  Vernard Gambles, PharmD, BCPS  05/10/2017,1:34 AM

## 2017-05-11 LAB — HEPARIN LEVEL (UNFRACTIONATED)
Heparin Unfractionated: 0.16 IU/mL — ABNORMAL LOW (ref 0.30–0.70)
Heparin Unfractionated: 0.31 IU/mL (ref 0.30–0.70)

## 2017-05-11 LAB — CBC
HCT: 37.4 % — ABNORMAL LOW (ref 39.0–52.0)
Hemoglobin: 12.1 g/dL — ABNORMAL LOW (ref 13.0–17.0)
MCH: 29.1 pg (ref 26.0–34.0)
MCHC: 32.4 g/dL (ref 30.0–36.0)
MCV: 89.9 fL (ref 78.0–100.0)
Platelets: 160 10*3/uL (ref 150–400)
RBC: 4.16 MIL/uL — ABNORMAL LOW (ref 4.22–5.81)
RDW: 13.8 % (ref 11.5–15.5)
WBC: 16.3 10*3/uL — ABNORMAL HIGH (ref 4.0–10.5)

## 2017-05-11 LAB — GLUCOSE, CAPILLARY
Glucose-Capillary: 184 mg/dL — ABNORMAL HIGH (ref 65–99)
Glucose-Capillary: 168 mg/dL — ABNORMAL HIGH (ref 65–99)
Glucose-Capillary: 174 mg/dL — ABNORMAL HIGH (ref 65–99)
Glucose-Capillary: 164 mg/dL — ABNORMAL HIGH (ref 65–99)

## 2017-05-11 MED ORDER — APIXABAN 5 MG PO TABS
5.0000 mg | ORAL_TABLET | Freq: Two times a day (BID) | ORAL | Status: DC
Start: 2017-05-11 — End: 2017-05-12
  Administered 2017-05-11 – 2017-05-12 (×3): 5 mg via ORAL
  Filled 2017-05-11 (×3): qty 1

## 2017-05-11 NOTE — Progress Notes (Signed)
ANTICOAGULATION CONSULT NOTE  Pharmacy Consult for heparin Indication: atrial fibrillation  Allergies  Allergen Reactions  . Ciprofloxacin Rash    Patient Measurements: Height: 6' (182.9 cm) Weight: 178 lb 5.6 oz (80.9 kg) IBW/kg (Calculated) : 77.6  Vital Signs: Temp: 98.6 F (37 C) (03/23 0812) Temp Source: Oral (03/23 0812) BP: 126/76 (03/23 0812) Pulse Rate: 81 (03/23 0812)  Labs: Recent Labs    05/09/17 0953 05/10/17 0018  05/10/17 0923 05/10/17 1637 05/11/17 0247 05/11/17 0944  HGB 15.8 12.9*  --  13.8  --  12.1*  --   HCT 46.2 40.4  --  41.8  --  37.4*  --   PLT 187 179  --  158  --  160  --   APTT 30  --   --   --   --   --   --   LABPROT 14.2  --   --   --   --   --   --   INR 1.11  --   --   --   --   --   --   HEPARINUNFRC  --   --    < > <0.10* <0.10* 0.16* 0.31  CREATININE 0.98 1.03  --   --   --   --   --    < > = values in this interval not displayed.    Estimated Creatinine Clearance: 88.9 mL/min (by C-G formula based on SCr of 1.03 mg/dL).   Medical History: Past Medical History:  Diagnosis Date  . Coronary artery disease   . Diabetes mellitus   . Dyslipidemia   . History of kidney stones   . Hypertension   . Paroxysmal atrial fibrillation (HCC) 12/2010  . Tobacco abuse     Assessment: 57yo male on Eliquis PTA for Afib, now s/p endarterectomy and fem-pop bypass, on IV heparin for anticoagulation.  Heparin level this morning came back therapeutic at 0.31, on 1650 units/hr. Plan to now transition to apixaban. Was on prior to admission. Hgb is 12.1 today, plts WNL - no s/sx of bleeding. Will resume full dose apixaban since age<80, Scr<1.5, and weight>60 kg.   Goal of Therapy:  Monitor platelets by anticoagulation protocol: Yes   Plan:  Discontinue heparin infusion Start apixaban 5 mg twice daily at time of heparin discontinuation  Monitor CBC, renal fx, and s/sx of bleeding  Girard CooterKimberly Perkins, PharmD Clinical Pharmacist  Pager:  [phone] Clinical Phone for 05/11/2017 until 3:30pm: [phone] If after 3:30pm, please call main pharmacy at [phone] 05/11/2017 10:48 AM

## 2017-05-11 NOTE — Progress Notes (Addendum)
  Progress Note    05/11/2017 8:49 AM 2 Days Post-Op  Subjective:  C/o swelling in his left leg & soreness; left foot pain is better  Afebrile HR 70's-90's Afib 80's-120's systolic 100% RA  Vitals:   05/11/17 0331 05/11/17 0812  BP: 113/72 126/76  Pulse: 87 81  Resp:  (!) 22  Temp: 98.4 F (36.9 C) 98.6 F (37 C)  SpO2:  100%    Physical Exam: Cardiac:  irregular Lungs:  Non labored Incisions:  Left groin incision is clean and dry without hematoma Extremities:  Easily palpable left DP; mild LLE swelling   CBC    Component Value Date/Time   WBC 16.3 (H) 05/11/2017 0247   RBC 4.16 (L) 05/11/2017 0247   HGB 12.1 (L) 05/11/2017 0247   HCT 37.4 (L) 05/11/2017 0247   PLT 160 05/11/2017 0247   MCV 89.9 05/11/2017 0247   MCH 29.1 05/11/2017 0247   MCHC 32.4 05/11/2017 0247   RDW 13.8 05/11/2017 0247   LYMPHSABS 2.9 12/10/2016 1332   MONOABS 1.3 (H) 12/10/2016 1332   EOSABS 0.4 12/10/2016 1332   BASOSABS 0.1 12/10/2016 1332    BMET    Component Value Date/Time   NA 135 05/10/2017 0018   K 4.2 05/10/2017 0018   CL 101 05/10/2017 0018   CO2 24 05/10/2017 0018   GLUCOSE 228 (H) 05/10/2017 0018   BUN 11 05/10/2017 0018   CREATININE 1.03 05/10/2017 0018   CALCIUM 8.6 (L) 05/10/2017 0018   GFRNONAA >60 05/10/2017 0018   GFRAA >60 05/10/2017 0018    INR    Component Value Date/Time   INR 1.11 05/09/2017 0953     Intake/Output Summary (Last 24 hours) at 05/11/2017 0849 Last data filed at 05/11/2017 0332 Gross per 24 hour  Intake 3493.43 ml  Output 1050 ml  Net 2443.43 ml     Assessment:  57 y.o. male is s/p:  1.  Left iliofemoral endarterectomy with Dacron patch angioplasty 2.  Left common femoral artery to below-the-knee popliteal artery bypass with Propaten 3.  Linton patch placement on below-the-knee popliteal artery   2 Days Post-Op  Plan: -pt doing well this morning with palpable left DP pulse -he does have some mild swelling in the left  leg-discussed leg elevation  -DVT prophylaxis:  Heparin gtt for afib-he was on Eliquis pre-op-possibly start back tomorrow -leukocytosis improving -continue mobilization and oob   Doreatha MassedSamantha Rhyne, PA-C [facility] [phone] 05/11/2017 8:49 AM  Agree with above Restart oral anticoagulation  D/c home tomorrow  Fabienne Brunsharles Fields, MD [facility] [place] Office: [phone] Pager: [phone]

## 2017-05-11 NOTE — Progress Notes (Signed)
ANTICOAGULATION CONSULT NOTE  Pharmacy Consult for heparin Indication: atrial fibrillation  Allergies  Allergen Reactions  . Ciprofloxacin Rash    Patient Measurements: Height: 6' (182.9 cm) Weight: 178 lb 5.6 oz (80.9 kg) IBW/kg (Calculated) : 77.6  Vital Signs: Temp: 98.4 F (36.9 C) (03/23 0331) Temp Source: Oral (03/23 0331) BP: 113/72 (03/23 0331) Pulse Rate: 87 (03/23 0331)  Labs: Recent Labs    05/09/17 0953 05/10/17 0018 05/10/17 0923 05/10/17 1637 05/11/17 0247  HGB 15.8 12.9* 13.8  --  12.1*  HCT 46.2 40.4 41.8  --  37.4*  PLT 187 179 158  --  160  APTT 30  --   --   --   --   LABPROT 14.2  --   --   --   --   INR 1.11  --   --   --   --   HEPARINUNFRC  --   --  <0.10* <0.10* 0.16*  CREATININE 0.98 1.03  --   --   --     Estimated Creatinine Clearance: 88.9 mL/min (by C-G formula based on SCr of 1.03 mg/dL).   Medical History: Past Medical History:  Diagnosis Date  . Coronary artery disease   . Diabetes mellitus   . Dyslipidemia   . History of kidney stones   . Hypertension   . Paroxysmal atrial fibrillation (HCC) 12/2010  . Tobacco abuse     Assessment: [age]yo male on Eliquis PTA for Afib, now s/p endarterectomy and fem-pop bypass, on IV heparin for anticoagulation. per Dr. Nicky Pughhen's note, will restart Eliquis the beginning of next week if no bleeding.  Heparin level remains subtherapeutic.   Goal of Therapy:  Heparin level 0.3-0.7 units/ml Monitor platelets by anticoagulation protocol: Yes    Plan:  Increase heparin rate to 1650 units/hr Daily heparin level and CBC Check confirmatory in 6 hours    Baldemar FridayMasters, Wylee Ogden M 05/11/2017 3:54 AM

## 2017-05-12 LAB — GLUCOSE, CAPILLARY
Glucose-Capillary: 155 mg/dL — ABNORMAL HIGH (ref 65–99)
Glucose-Capillary: 138 mg/dL — ABNORMAL HIGH (ref 65–99)

## 2017-05-12 MED ORDER — HYDROCODONE-ACETAMINOPHEN 5-325 MG PO TABS: 1.0000 | ORAL_TABLET | Freq: Four times a day (QID) | ORAL | 0 refills | Status: DC | PRN

## 2017-05-12 NOTE — Care Management Note (Signed)
Case Management Note Donn PieriniKristi Siearra Amberg RN, BSN Unit 4E-Case Manager [phone]  Patient Details  Name: Willie CarmelGeorge A Kempker MRN: [id number] Date of Birth: 03/03/1960  Subjective/Objective:   Pt admitted s/p L iliofemoral EA w/ DPA, BPA BTK pop, L CFA to BK pop BPG w/ Propaten                 Action/Plan: PTA pt lived at home with family who will be able to assist 24/7. [facility] PT [facility]- will need a RW for home- order has been placed- [facility]- RW will need to be delivered to room prior to discharge.  CM to follow for any further transition of care needs  Expected Discharge Date:  05/12/17               Expected Discharge Plan:  Home/Self Care  In-House Referral:  NA  Discharge planning Services  CM Consult  Post Acute Care Choice:  Durable Medical Equipment Choice offered to:  Patient  DME Arranged:  Dan HumphreysWalker rolling DME Agency:  [facility]  [facility] Ar[facility]  [facility]  Status of Service:  Completed, signed off  If discussed at Long Length of Stay Meetings, dates discussed:    Discharge Disposition: home/home health   Additional Comments:  05/13/17- 1230- Silva BandyKristi Amiley Shishido RN CM- pt for d/c home today- [facility]- which has been delivered to room- received call from bedside RN- Dahlia ClientHannah- regarding need for HHPT- per Dahlia ClientHannah [facility]- but no return call yet- CM spoke with pt who is wanted HHPT for a few visits to increase mobility- choice offered- pt has no preference- if fine [facility]- [facility]- referral accepted  Darrold SpanWebster, Dari Carpenito Hall, RN 05/12/2017, 1:06 PM

## 2017-05-12 NOTE — Progress Notes (Addendum)
  Progress Note    05/12/2017 7:47 AM 3 Days Post-Op  Subjective:  Ready to go home; foot feeling better  Tm 99.9 now afebrile HR  80's-100's afib 110's-140's systolic 93% RA  Vitals:   05/11/17 2022 05/12/17 0557  BP: (!) 141/84 116/74  Pulse: 100   Resp: 17 (!) 27  Temp: 99.9 F (37.7 C) 98.4 F (36.9 C)  SpO2: 99% 93%    Physical Exam: Cardiac:  irregular Lungs:  Non labored Incisions:  All incisions look good Extremities:  +palpable left DP pulse   CBC    Component Value Date/Time   WBC 16.3 (H) 05/11/2017 0247   RBC 4.16 (L) 05/11/2017 0247   HGB 12.1 (L) 05/11/2017 0247   HCT 37.4 (L) 05/11/2017 0247   PLT 160 05/11/2017 0247   MCV 89.9 05/11/2017 0247   MCH 29.1 05/11/2017 0247   MCHC 32.4 05/11/2017 0247   RDW 13.8 05/11/2017 0247   LYMPHSABS 2.9 12/10/2016 1332   MONOABS 1.3 (H) 12/10/2016 1332   EOSABS 0.4 12/10/2016 1332   BASOSABS 0.1 12/10/2016 1332    BMET    Component Value Date/Time   NA 135 05/10/2017 0018   K 4.2 05/10/2017 0018   CL 101 05/10/2017 0018   CO2 24 05/10/2017 0018   GLUCOSE 228 (H) 05/10/2017 0018   BUN 11 05/10/2017 0018   CREATININE 1.03 05/10/2017 0018   CALCIUM 8.6 (L) 05/10/2017 0018   GFRNONAA >60 05/10/2017 0018   GFRAA >60 05/10/2017 0018    INR    Component Value Date/Time   INR 1.11 05/09/2017 0953     Intake/Output Summary (Last 24 hours) at 05/12/2017 0747 Last data filed at 05/12/2017 0602 Gross per 24 hour  Intake 840 ml  Output 850 ml  Net -10 ml     Assessment:  57 y.o. male is s/p:  1. Left iliofemoral endarterectomy with Dacron patch angioplasty 2. Leftcommon femoral artery to below-the-knee popliteal artery bypass with Propaten 3. Linton patch placement on below-the-kneepopliteal artery   3 Days Post-Op   Plan: -pt doing well this morning with palpable left DP pulse -DVT prophylaxis:  Eliquis restarted yesterday -pt will discharge today-RW ordered for home and will ask PT  will see pt prior to discharge. -f/u with Dr. Imogene Burnhen in a couple of weeks-office will arrange. -discussed wound care with pt   Doreatha MassedSamantha Rhyne, PA-C [facility] [phone] 05/12/2017 7:47 AM  Agree with above D/c home  Fabienne Brunsharles Fields, MD [facility] [place] Office: [phone] Pager: [phone]

## 2017-05-12 NOTE — Discharge Summary (Addendum)
Discharge Summary     Willie Morales [birth year] 57 y.o. male  [id number]  Admission Date: 05/09/2017  Discharge Date: 05/12/17  Physician: Fransisco Hertz, MD  Admission Diagnosis: PERIPERAL ARTERY DISEASE   HPI:   This is a 57 y.o. male who presents with left leg rest pain for one month after occlusion of his left superficial femoral artery stents.  I recommended: left iliofemoral endarterectomy with patch angioplasty, left common femoral artery to below-the-knee popliteal artery bypass.  The risk, benefits, and alternative for bypass operations were discussed with the patient.  The patient is aware the risks include but are not limited to: bleeding, infection, myocardial infarction, stroke, limb loss, nerve damage, limb edema, need for additional procedures in the future, wound complications, and inability to complete the bypass.  The patient is aware of these risks and agreed to proceed.   Hospital Course:  The patient was admitted to the hospital and taken to the operating room on 05/09/2017 and underwent: 1.  Left iliofemoral endarterectomy with Dacron patch angioplasty 2.  Left common femoral artery to below-the-knee popliteal artery bypass with Propaten 3.  Linton patch placement on below-the-knee popliteal artery    FINDING(S): 1.  >75% stenosis in common femoral artery extending into distal external iliac artery: resolved with endarterectomy and Dacron patch angioplasty 2.  Occluded right superficial femoral artery 3.  Limited backbleeding from profunda femoral artery  4.  Heavily calcified below-the-knee popliteal artery with eccentric plaque: improved with bovine patch angioplasty 5.  Dopplerable anterior tibial artery and posterior tibial artery at end of case: signals near completely abort with graft compression  The pt tolerated the procedure well and was transported to the PACU in good condition.   By POD 1, pt was doing well.  He was on heparin gtt for afib.  He  had no evidence of bleeding.    On POD 2, the pt had a palpable left DP pulse.  He had mild leg swelling and discussed leg elevation with the pt.  His leukocytosis was improving.  Continue mobilization.  Pt was converted from heparin gtt back to Eliquis.  On POD 3, pt was doing well with no evidence of bleeding.  He was discharged home.   The remainder of the hospital course consisted of increasing mobilization and increasing intake of solids without difficulty.  CBC    Component Value Date/Time   WBC 16.3 (H) 05/11/2017 0247   RBC 4.16 (L) 05/11/2017 0247   HGB 12.1 (L) 05/11/2017 0247   HCT 37.4 (L) 05/11/2017 0247   PLT 160 05/11/2017 0247   MCV 89.9 05/11/2017 0247   MCH 29.1 05/11/2017 0247   MCHC 32.4 05/11/2017 0247   RDW 13.8 05/11/2017 0247   LYMPHSABS 2.9 12/10/2016 1332   MONOABS 1.3 (H) 12/10/2016 1332   EOSABS 0.4 12/10/2016 1332   BASOSABS 0.1 12/10/2016 1332    BMET    Component Value Date/Time   NA 135 05/10/2017 0018   K 4.2 05/10/2017 0018   CL 101 05/10/2017 0018   CO2 24 05/10/2017 0018   GLUCOSE 228 (H) 05/10/2017 0018   BUN 11 05/10/2017 0018   CREATININE 1.03 05/10/2017 0018   CALCIUM 8.6 (L) 05/10/2017 0018   GFRNONAA >60 05/10/2017 0018   GFRAA >60 05/10/2017 0018     Discharge Instructions    Discharge patient   Complete by:  As directed    Discharge pt after all HH needs are addressed and pt has been seen by PT.  Thanks!   Discharge disposition:  01-Home or Self Care   Discharge patient date:  05/12/2017      Discharge Diagnosis:  PERIPERAL ARTERY DISEASE  Secondary Diagnosis: Patient Active Problem List   Diagnosis Date Noted  . PAD (peripheral artery disease) (HCC) 05/09/2017  . Atherosclerosis of artery of extremity with rest pain (HCC)   . Preoperative cardiovascular examination   . Leukocytosis 12/10/2016  . Atherosclerosis of native arteries of extremity with rest pain (HCC) 07/05/2016  . Paroxysmal atrial fibrillation  (HCC)   . Coronary artery disease    Past Medical History:  Diagnosis Date  . Coronary artery disease   . Diabetes mellitus   . Dyslipidemia   . History of kidney stones   . Hypertension   . Paroxysmal atrial fibrillation (HCC) 12/2010  . Tobacco abuse      Allergies as of 05/12/2017      Reactions   Ciprofloxacin Rash      Medication List    TAKE these medications   apixaban 5 MG Tabs tablet Commonly known as:  ELIQUIS Take 1 tablet (5 mg total) by mouth 2 (two) times daily.   atorvastatin 10 MG tablet Commonly known as:  LIPITOR Take 10 mg by mouth daily.   benazepril 10 MG tablet Commonly known as:  LOTENSIN Take 1 tablet (10 mg total) by mouth daily.   clopidogrel 75 MG tablet Commonly known as:  PLAVIX Take 1 tablet (75 mg total) by mouth daily.   gabapentin 300 MG capsule Commonly known as:  NEURONTIN Take 1 capsule (300 mg total) by mouth 3 (three) times daily. What changed:  when to take this   glimepiride 4 MG tablet Commonly known as:  AMARYL Take 1 tablet (4 mg total) by mouth daily with breakfast.   HYDROcodone-acetaminophen 5-325 MG tablet Commonly known as:  NORCO/VICODIN Take 1 tablet by mouth every 6 (six) hours as needed for moderate pain. What changed:    how much to take  how to take this  when to take this  reasons to take this  additional instructions   levothyroxine 25 MCG tablet Commonly known as:  SYNTHROID, LEVOTHROID Take 25 mcg by mouth daily before breakfast.   metoprolol tartrate 25 MG tablet Commonly known as:  LOPRESSOR Take 1 tablet (25 mg total) by mouth 2 (two) times daily.   pregabalin 50 MG capsule Commonly known as:  LYRICA Take 1 capsule (50 mg total) by mouth 3 (three) times daily.   sitaGLIPtin-metformin 50-1000 MG tablet Commonly known as:  JANUMET Take 1 tablet by mouth 2 (two) times daily.            Durable Medical Equipment  (From admission, onward)        Start     Ordered   05/10/17  1507  For home use only DME Walker rolling  Once    Question:  Patient needs a walker to treat with the following condition  Answer:  S/P femoral-popliteal bypass surgery   05/10/17 1506      Discharge Instructions: [facility] Discharge instructions Lower Extremity Bypass Surgery  Please refer to the following instruction for your post-procedure care. Your surgeon or physician assistant will discuss any changes with you.  Activity  You are encouraged to walk as much as you can. You can slowly return to normal activities during the month after your surgery. Avoid strenuous activity and heavy lifting until your doctor tells you it's OK. Avoid activities  such as vacuuming or swinging a golf club. Do not drive until your doctor give the OK and you are no longer taking prescription pain medications. It is also normal to have difficulty with sleep habits, eating and bowel movement after surgery. These will go away with time.  Bathing/Showering  You may shower after you go home. Do not soak in a bathtub, hot tub, or swim until the incision heals completely.  Incision Care  Clean your incision with mild soap and water. Shower every day. Pat the area dry with a clean towel. You do not need a bandage unless otherwise instructed. Do not apply any ointments or creams to your incision. If you have open wounds you will be instructed how to care for them or a visiting nurse may be arranged for you. If you have staples or sutures along your incision they will be removed at your post-op appointment. You may have skin glue on your incision. Do not peel it off. It will come off on its own in about one week.  Wash the groin wound with soap and water daily and pat dry. (No tub bath-only shower)  Then put a dry gauze or washcloth in the groin to keep this area dry to help prevent wound infection.  Do this daily and as needed.  Do not use Vaseline or neosporin on your incisions.   Only use soap and water on your incisions and then protect and keep dry.  Diet  Resume your normal diet. There are no special food restrictions following this procedure. A low fat/ low cholesterol diet is recommended for all patients with vascular disease. In order to heal from your surgery, it is CRITICAL to get adequate nutrition. Your body requires vitamins, minerals, and protein. Vegetables are the best source of vitamins and minerals. Vegetables also provide the perfect balance of protein. Processed food has little nutritional value, so try to avoid this.  Medications  Resume taking all your medications unless your doctor or Physician Assistant tells you not to. If your incision is causing pain, you may take over-the-counter pain relievers such as acetaminophen (Tylenol). If you were prescribed a stronger pain medication, please aware these medication can cause nausea and constipation. Prevent nausea by taking the medication with a snack or meal. Avoid constipation by drinking plenty of fluids and eating foods with high amount of fiber, such as fruits, vegetables, and grains. Take Colace 100 mg (an over-the-counter stool softener) twice a day as needed for constipation. Do not take Tylenol if you are taking prescription pain medications.  Follow Up  Our office will schedule a follow up appointment 2-3 weeks following discharge.  Please call us immediately for any of the following conditions  .Severe or worsening pain in your legs or feet while at rest or while walking .Increase pain, redness, warmth, or drainage (pus) from your incision site(s) Fever of 101 degree or higher The swelling in your leg with the bypass suddenly worsens and becomes more painful than when you were in the hospital If you have been instructed to feel your graft pulse then you should do so every day. If you can no longer feel this pulse, call the office immediately. Not all patients are given this instruction.  Leg  swelling is common after leg bypass surgery.  The swelling should improve over a few months following surgery. To improve the swelling, you may elevate your legs above the level of your heart while you are sitting or resting. Your Careers adviser or  physician assistant may ask you to apply an ACE wrap or wear compression (TED) stockings to help to reduce swelling.  Reduce your risk of vascular disease  Stop smoking. If you would like help call [facility] at 1-800-QUIT-NOW (61517033711-(828) 831-3593) or [facility] at 573-256-7846([phone] 503-9579.  Manage your cholesterol Maintain a desired weight Control your diabetes weight Control your diabetes Keep your blood pressure down  If you have any questions, please call the office at [phone]   Prescriptions given: 1.  Norco #30 No Refill  Disposition: home  Patient's condition: is Good  Follow up: 1. Dr. Imogene Burnhen in 2 weeks   Doreatha MassedSamantha Rhyne, PA-C [facility] [phone] 05/12/2017  7:55 AM   Addendum  I agree with my PA's discharge summary.  Pt required extensive L iliofemoral endarterectomy with long Dacron patch angioplasty due to L EIA disease.  His distal target also had heavy calcification which required a bovine patch angioplasty.  His L CFA to BK popliteal artery bypass was completed without difficulty.  Patient had markedly augmentation of left tibial arterial blood flow post-op.  His post-operative course was unremarkable.  Patient will follow up with me in 2 weeks.  Leonides SakeBrian Chinenye Katzenberger, MD, FACS [facility] [place] Office: [phone] Pager: [phone]  05/15/2017, 10:14 AM    - For [facility] Registry use ---   Post-op:  Wound infection: No  Graft infection: No  Transfusion: No    If yes, n/a units given New Arrhythmia: No Ipsilateral amputation: No, [ ]  Minor, [ ]  BKA, [ ]  AKA Discharge patency: [x ] Primary, [ ]  Primary assisted, [ ]  Secondary, [ ]  Occluded Patency judged by: [ ]  Dopper only, [ ]  Palpable  graft pulse, [x]  Palpable distal pulse, [ ]  ABI inc. > 0.15, [ ]  Duplex Discharge ABI: R 0.74, L 0.91 D/C Ambulatory Status: Ambulatory with Assistance -RW  Complications: MI: No, [ ]  Troponin only, [ ]  EKG or Clinical CHF: No Resp failure:No, [ ]  Pneumonia, [ ]  Ventilator Chg in renal function: No, [ ]  Inc. Cr > 0.5, [ ]  Temp. Dialysis,  [ ]  Permanent dialysis Stroke: No, [ ]  Minor, [ ]  Major Return to OR: No  Reason for return to OR: [ ]  Bleeding, [ ]  Infection, [ ]  Thrombosis, [ ]  Revision  Discharge medications: Statin use:  yes ASA use:  no Plavix use:  yes Beta blocker use: yes CCB use:  No ACEI use:   yes ARB use:  no Coumadin use: no Eliquis:  Yes (afib)

## 2017-05-12 NOTE — Progress Notes (Signed)
D/c instructions and prescription given to patient. IV removed, clean and intact. Telemetry removed. RW delivered to room and sent home with patient.  Versie StarksHanna  Camelia Stelzner, RN

## 2017-05-12 NOTE — Progress Notes (Signed)
Physical Therapy Treatment Patient Details Name: Willie Morales MRN: [id number] DOB: 02/02/1961 Today's Date: 05/12/2017    History of Present Illness Pt is a 57 y.o. male s/p L iliofemoral endarterectomy and L fem-popliteal artery bypass graft on 05/09/17. PMH includes a-fib, HTN, DM, CAD, tobacco abuse.    PT Comments    Continuing work on functional mobility and activity tolerance;  Noting modest improvements in gait pattern; updating recs to include HHPT for HEP, optimal activity and positioning for healing   Follow Up Recommendations  Home health PT     Equipment Recommendations  Rolling walker with 5" wheels    Recommendations for Other Services       Precautions / Restrictions Precautions Precautions: Fall    Mobility  Bed Mobility Overal bed mobility: Independent                Transfers Overall transfer level: Needs assistance Equipment used: Rolling walker (2 wheeled) Transfers: Sit to/from Stand Sit to Stand: Supervision         General transfer comment: Cues for hand placement  Ambulation/Gait Ambulation/Gait assistance: Supervision Ambulation Distance (Feet): 200 Feet Assistive device: Rolling walker (2 wheeled) Gait Pattern/deviations: Antalgic;Step-through pattern(emerging) Gait velocity: Decreased   General Gait Details: Initially minimizing weight through LLE, and advancing both legs simultaneously; cues to smooth out giat pattern, and by the end of the walk, moreable to take at least partial weight bearing through LLE   Stairs Stairs: (Pt opted not to perform stairs, but we did discuss sequencing and use of rails)          Wheelchair Mobility    Modified Rankin (Stroke Patients Only)       Balance             Standing balance-Leahy Scale: Fair                              Cognition Arousal/Alertness: Awake/alert Behavior During Therapy: WFL for tasks assessed/performed Overall Cognitive Status: Within  Functional Limits for tasks assessed                                        Exercises      General Comments General comments (skin integrity, edema, etc.): We discussed optimal positioning for long duration stretch to improve knee extension      Pertinent Vitals/Pain Pain Assessment: Faces Faces Pain Scale: Hurts little more Pain Location: LLE with mobility Pain Descriptors / Indicators: Grimacing;Guarding Pain Intervention(s): Monitored during session    Home Living                      Prior Function            PT Goals (current goals can now be found in the care plan section) Acute Rehab PT Goals Patient Stated Goal: Return home; decreased pain PT Goal Formulation: With patient Time For Goal Achievement: 05/24/17 Potential to Achieve Goals: Good Progress towards PT goals: Progressing toward goals    Frequency    Min 3X/week      PT Plan Discharge plan needs to be updated    Co-evaluation              AM-PAC PT "6 Clicks" Daily Activity  Outcome Measure  Difficulty turning over in bed (including adjusting bedclothes, sheets and blankets)?: None Difficulty  moving from lying on back to sitting on the side of the bed? : None Difficulty sitting down on and standing up from a chair with arms (e.g., wheelchair, bedside commode, etc,.)?: A Little Help needed moving to and from a bed to chair (including a wheelchair)?: A Little Help needed walking in hospital room?: A Little Help needed climbing 3-5 steps with a railing? : A Little 6 Click Score: 20    End of Session Equipment Utilized During Treatment: Gait belt Activity Tolerance: Patient tolerated treatment well Patient left: in bed;with call bell/phone within reach   PT Visit Diagnosis: Other abnormalities of gait and mobility (R26.89)     Time: 1610-9604 PT Time Calculation (min) (ACUTE ONLY): 19 min  Charges:  $Gait Training: 8-22 mins                    G Codes:        Van Clines, PT  Acute Rehabilitation Services Pager [phone] Office [phone]    Levi Aland 05/12/2017, 1:43 PM

## 2017-05-13 ENCOUNTER — Telehealth: Payer: Self-pay | Admitting: Vascular Surgery

## 2017-05-13 LAB — GLUCOSE, CAPILLARY: Glucose-Capillary: 135 mg/dL — ABNORMAL HIGH (ref 65–99)

## 2017-05-13 NOTE — Telephone Encounter (Signed)
-----   Message from Sharee PimpleMarilyn K McChesney, RN sent at 05/10/2017  4:45 PM EDT ----- Regarding: 2-3 weeks    ----- Message ----- From: Dara Lordshyne, Samantha J, PA-C Sent: 05/10/2017   3:05 PM To: Vvs Charge Pool  S/p left fem endarterectomy and fem pop.  F/u with Dr. Imogene Burnhen in 2-3 weeks.  Thanks

## 2017-05-13 NOTE — Telephone Encounter (Signed)
Confirmed 05/29/17 appt with pt. Per Candise BowensJen put pt on PA schedule. 05/13/17  LS

## 2017-05-16 ENCOUNTER — Encounter (HOSPITAL_COMMUNITY): Payer: Self-pay | Admitting: Vascular Surgery

## 2017-05-23 NOTE — Anesthesia Postprocedure Evaluation (Signed)
Anesthesia Post Note  Patient: Willie Morales  Procedure(s) Performed: BYPASS GRAFT FEMORAL-POPLITEAL ARTERY LEFT (Left Leg Upper) ENDARTERECTOMY FEMORAL WITH DACRON PATCH ANGIOPLASTY (Left Leg Upper) PATCH ANGIOPLASTY WITH PROPATEN GRAFT  OF BELOW KNEE POPLITEAL ARTERY (Left Leg Lower)     Patient location during evaluation: PACU Anesthesia Type: General Level of consciousness: awake and alert Pain management: pain level controlled Vital Signs Assessment: post-procedure vital signs reviewed and stable Respiratory status: spontaneous breathing, nonlabored ventilation, respiratory function stable and patient connected to nasal cannula oxygen Cardiovascular status: blood pressure returned to baseline and stable Postop Assessment: no apparent nausea or vomiting Anesthetic complications: no    Last Vitals:  Vitals:   05/12/17 0756 05/12/17 0801  BP: 122/77   Pulse: 80 (!) 101  Resp: 20   Temp: 36.9 C   SpO2: 97%     Last Pain:  Vitals:   05/12/17 0948  TempSrc:   PainSc: 0-No pain                 Dehlia Kilner

## 2017-05-28 ENCOUNTER — Encounter: Payer: Self-pay | Admitting: Cardiovascular Disease

## 2017-05-28 ENCOUNTER — Ambulatory Visit: Payer: BLUE CROSS/BLUE SHIELD | Admitting: Cardiovascular Disease

## 2017-05-28 VITALS — BP 122/66 | HR 68 | Ht 72.0 in | Wt 178.0 lb

## 2017-05-28 DIAGNOSIS — I482 Chronic atrial fibrillation, unspecified: Secondary | ICD-10-CM

## 2017-05-28 DIAGNOSIS — I739 Peripheral vascular disease, unspecified: Secondary | ICD-10-CM

## 2017-05-28 DIAGNOSIS — E785 Hyperlipidemia, unspecified: Secondary | ICD-10-CM

## 2017-05-28 DIAGNOSIS — Z720 Tobacco use: Secondary | ICD-10-CM

## 2017-05-28 DIAGNOSIS — I2510 Atherosclerotic heart disease of native coronary artery without angina pectoris: Secondary | ICD-10-CM | POA: Diagnosis not present

## 2017-05-28 NOTE — Patient Instructions (Signed)
Medication Instructions:  No medication changes    Follow-Up: Your physician recommends that you schedule a follow-up appointment in: 3 months with Dr. Lyman BishopLawrence   Any Other Special Instructions Will Be Listed Below (If Applicable).     If you need a refill on your cardiac medications before your next appointment, please call your pharmacy.

## 2017-05-28 NOTE — Progress Notes (Signed)
Cardiology Office Note   Date:  05/29/2017   ID:  Willie Morales, DOB 09/15/1960, MRN [id number]  PCP:  Benita StabileHall, John Z, MD  Cardiologist:   Lorine BearsMuhammad Valori Hollenkamp, MD   No chief complaint on file.     History of Present Illness: Willie Morales is a 57 y.o. male who presents for a follow-up visit regarding atrial fibrillation and mild nonobstructive coronary artery disease.  The patient [place] by [place] in [place] in [place].  He has been followed mostly by Bailey MechKatherine Lawrence.  He is known to have severe peripheral arterial disease and was hospitalized recently for left femoropopliteal bypass.  He was seen by our consult team for preoperative cardiovascular evaluation.  Previous remote cardiac catheterization showed mild nonobstructive coronary artery disease.  The patient underwent surgery without complications.  He was noted to be in atrial fibrillation with controlled ventricular rate and metoprolol.  He is on long-term anticoagulation with Eliquis. He has been doing reasonably well and denies any chest pain, shortness of breath or palpitations.  He is taking his medications regularly.  He continues to smoke almost 1 pack/day and he reports inability to quit smoking.    Past Medical History:  Diagnosis Date  . Coronary artery disease   . Diabetes mellitus   . Dyslipidemia   . History of kidney stones   . Hypertension   . Paroxysmal atrial fibrillation (HCC) 12/2010  . Tobacco abuse     Past Surgical History:  Procedure Laterality Date  . ABDOMINAL AORTOGRAM W/LOWER EXTREMITY N/A 07/05/2016   Procedure: Abdominal Aortogram w/Lower Extremity;  Surgeon: Fransisco Hertzhen, Brian L, MD;  [facility];  Service: Cardiovascular;  Laterality: N/A;  . ABDOMINAL AORTOGRAM W/LOWER EXTREMITY N/A 05/06/2017   Procedure: ABDOMINAL AORTOGRAM W/LOWER EXTREMITY;  Surgeon: Maeola Harmanain, Brandon Christopher, MD;  [facility];  Service: Cardiovascular;  Laterality: N/A;  . BACK SURGERY  1991  . CARDIAC  CATHETERIZATION  07/31/2007   This showed nonobstructive coronary artery disease   He had 30% lesion in the mid RCA, 30% lesion in the PDA, 45% lesion in the LAD and mid  LAD, and 30% lesion in the diagonal.  The left circumflex was normal.  The LV function was 60%.     Marland Kitchen. ENDARTERECTOMY FEMORAL Left 05/09/2017   Procedure: ENDARTERECTOMY FEMORAL WITH DACRON PATCH ANGIOPLASTY;  Surgeon: Fransisco Hertzhen, Brian L, MD;  [facility];  Service: Vascular;  Laterality: Left;  . FEMORAL-POPLITEAL BYPASS GRAFT Left 05/09/2017   Procedure: BYPASS GRAFT FEMORAL-POPLITEAL ARTERY LEFT;  Surgeon: Fransisco Hertzhen, Brian L, MD;  [facility];  Service: Vascular;  Laterality: Left;  . KIDNEY STONE SURGERY  2011  . LOWER EXTREMITY ANGIOGRAPHY N/A 12/06/2016   Procedure: Lower Extremity Angiography;  Surgeon: Fransisco Hertzhen, Brian L, MD;  [facility];  Service: Cardiovascular;  Laterality: N/A;  . PATCH ANGIOPLASTY Left 05/09/2017   Procedure: PATCH ANGIOPLASTY WITH PROPATEN GRAFT  OF BELOW KNEE POPLITEAL ARTERY;  Surgeon: Fransisco Hertzhen, Brian L, MD;  [facility];  Service: Vascular;  Laterality: Left;  . PERIPHERAL VASCULAR ATHERECTOMY Right 07/19/2016   Procedure: Peripheral Vascular Atherectomy;  Surgeon: Fransisco Hertzhen, Brian L, MD;  [facility];  Service: Cardiovascular;  Laterality: Right;  . PERIPHERAL VASCULAR BALLOON ANGIOPLASTY Left 07/05/2016   Procedure: Peripheral Vascular Balloon Angioplasty;  Surgeon: Fransisco Hertzhen, Brian L, MD;  [facility];  Service: Cardiovascular;  Laterality: Left;  SFA  . PERIPHERAL VASCULAR INTERVENTION  12/06/2016  Procedure: PERIPHERAL VASCULAR INTERVENTION;  Surgeon: Fransisco Hertz, MD;  Location: [facility] INVASIVE CV LAB;  Service: Cardiovascular;;  LEFT     Current Outpatient Medications  Medication Sig Dispense Refill  . apixaban (ELIQUIS) 5 MG TABS tablet Take 1 tablet (5 mg total) by mouth 2 (two) times daily. 60 tablet 6  . atorvastatin (LIPITOR) 10 MG tablet Take 10 mg by  mouth daily.   1  . benazepril (LOTENSIN) 10 MG tablet Take 1 tablet (10 mg total) by mouth daily. 30 tablet 6  . clopidogrel (PLAVIX) 75 MG tablet Take 1 tablet (75 mg total) by mouth daily. 30 tablet 11  . gabapentin (NEURONTIN) 300 MG capsule Take 1 capsule (300 mg total) by mouth 3 (three) times daily. (Patient taking differently: Take 300 mg by mouth daily. ) 90 capsule 11  . glimepiride (AMARYL) 4 MG tablet Take 1 tablet (4 mg total) by mouth daily with breakfast. 30 tablet 6  . HYDROcodone-acetaminophen (NORCO/VICODIN) 5-325 MG tablet Take 1 tablet by mouth every 6 (six) hours as needed for moderate pain. 30 tablet 0  . levothyroxine (SYNTHROID, LEVOTHROID) 25 MCG tablet Take 25 mcg by mouth daily before breakfast.     . metoprolol tartrate (LOPRESSOR) 25 MG tablet Take 1 tablet (25 mg total) by mouth 2 (two) times daily. 60 tablet 6  . sitaGLIPtin-metformin (JANUMET) 50-1000 MG tablet Take 1 tablet by mouth 2 (two) times daily.    . pregabalin (LYRICA) 50 MG capsule Take 1 capsule (50 mg total) by mouth 3 (three) times daily. (Patient not taking: Reported on 05/28/2017) 90 capsule 2   No current facility-administered medications for this visit.     Allergies:   Ciprofloxacin    Social History:  The patient  reports that he has been smoking cigarettes.  He has a 35.00 pack-year smoking history. He has never used smokeless tobacco. He reports that he does not drink alcohol or use drugs.   Family History:  The patient's family history includes Cancer in his mother; Coronary artery disease in his father; Diabetes in his maternal grandmother; Heart attack in his father; Heart disease in his brother, father, and mother; SIDS in his sister.    ROS:  Please see the history of present illness.   Otherwise, review of systems are positive for none.   All other systems are reviewed and negative.    PHYSICAL EXAM: VS:  BP 122/66   Pulse 68   Ht 6' (1.829 m)   Wt 178 lb (80.7 kg)   BMI 24.14  kg/m  , BMI Body mass index is 24.14 kg/m. GEN: Well nourished, well developed, in no acute distress  HEENT: normal  Neck: no JVD, carotid bruits, or masses Cardiac: Irregularly irregular; no murmurs, rubs, or gallops,no edema  Respiratory:  clear to auscultation bilaterally, normal work of breathing GI: soft, nontender, nondistended, + BS MS: no deformity or atrophy  Skin: warm and dry, no rash Neuro:  Strength and sensation are intact Psych: euthymic mood, full affect   EKG:  EKG is not ordered today.    Recent Labs: 05/09/2017: ALT 14 05/10/2017: BUN 11; Creatinine, Ser 1.03; Potassium 4.2; Sodium 135 05/11/2017: Hemoglobin 12.1; Platelets 160    Lipid Panel    Component Value Date/Time   CHOL 189 03/20/2016 0755   TRIG 185 (H) 03/20/2016 0755   HDL 50 03/20/2016 0755   CHOLHDL 3.8 03/20/2016 0755   VLDL 37 03/20/2016 0755   LDLCALC 102 (H) 03/20/2016 0755  Wt Readings from Last 3 Encounters:  05/28/17 178 lb (80.7 kg)  05/09/17 178 lb 5.6 oz (80.9 kg)  05/06/17 180 lb (81.6 kg)       No flowsheet data found.    ASSESSMENT AND PLAN:  1.  Atrial fibrillation: Likely chronic at this stage.  Ventricular rate seems to be reasonably controlled on metoprolol 25 mg twice daily.  He is on long-term anticoagulation with Eliquis 5 mg twice daily.  2.  History of mild nonobstructive coronary artery disease: He has no anginal symptoms.  Continue medical therapy.  3.  Hyperlipidemia: Continue treatment with atorvastatin with a target LDL of less than 70.  4.  Tobacco use: The patient reports inability to quit although he knows the need to.  5.  Peripheral arterial disease: Status post recent left femoropopliteal bypass.  Followed by Dr. Imogene Burn.  The patient is on Plavix.  Given that he is on Eliquis, we should consider stopping Plavix to minimize the risk of bleeding unless there is a strong indication.  I will forward this to Dr. Imogene Burn.    Disposition:   FU with  Joni Reining in 3 months  Signed,  Lorine Bears, MD  05/29/2017 8:22 AM    [facility]

## 2017-05-29 ENCOUNTER — Ambulatory Visit (INDEPENDENT_AMBULATORY_CARE_PROVIDER_SITE_OTHER): Payer: BLUE CROSS/BLUE SHIELD | Admitting: Physician Assistant

## 2017-05-29 ENCOUNTER — Other Ambulatory Visit: Payer: Self-pay

## 2017-05-29 ENCOUNTER — Encounter: Payer: Self-pay | Admitting: Physician Assistant

## 2017-05-29 ENCOUNTER — Other Ambulatory Visit: Payer: Self-pay | Admitting: Vascular Surgery

## 2017-05-29 VITALS — BP 133/83 | HR 91 | Temp 97.9°F | Resp 16 | Ht 72.0 in | Wt 178.0 lb

## 2017-05-29 DIAGNOSIS — I70222 Atherosclerosis of native arteries of extremities with rest pain, left leg: Secondary | ICD-10-CM

## 2017-05-29 DIAGNOSIS — M24562 Contracture, left knee: Secondary | ICD-10-CM

## 2017-05-29 MED ORDER — HYDROCODONE-ACETAMINOPHEN 5-325 MG PO TABS: 1.0000 | ORAL_TABLET | Freq: Four times a day (QID) | ORAL | 0 refills | Status: AC | PRN

## 2017-05-29 NOTE — Progress Notes (Addendum)
[facility]  Progress Note Bypass Surgery  Date of Surgery: 05/09/2017 PROCEDURE: 1.  Left iliofemoral endarterectomy with Dacron patch angioplasty 2.  Left common femoral artery to below-the-knee popliteal artery bypass with Propaten 3.  Linton patch placement on below-the-knee popliteal artery     Prior procedure include: 1. L SFA PTA+S (12/06/16) 2. OA+DCB PTA R fem-pop (07/19/16)for sub-total occlusion of R SFA and pop 3. L SFA SIA x 2 (07/05/16)for L SFA occlusion     Surgeon: Dr. Leonides SakeBrian Chen  History of Present Illness  Willie Morales is a 57 y.o. male who is  S/pLeft common femoral artery to below-the-knee popliteal artery bypass with Propaten.  He states he is slowly doing better, but continues to have pain issues and left groin wound/irritation.  He can't straighten out his left knee because of the staples and irritation when he tries to straighten his knee.      The patient's pre-op symptoms of left leg pain with claudication at short distances.  These limitation interfered with his activities of daily living.   He states he he can tell he has better blood flow and his left GT nail is growing out and looking better.  VASC. LAB Studies: pre-op        ABI: Right 0.77;  Left 0.37;    Significant Diagnostic Studies: CBC Lab Results  Component Value Date   WBC 16.3 (H) 05/11/2017   HGB 12.1 (L) 05/11/2017   HCT 37.4 (L) 05/11/2017   MCV 89.9 05/11/2017   PLT 160 05/11/2017    BMET    Component Value Date/Time   NA 135 05/10/2017 0018   K 4.2 05/10/2017 0018   CL 101 05/10/2017 0018   CO2 24 05/10/2017 0018   GLUCOSE 228 (H) 05/10/2017 0018   BUN 11 05/10/2017 0018   CREATININE 1.03 05/10/2017 0018   CALCIUM 8.6 (L) 05/10/2017 0018   GFRNONAA >60 05/10/2017 0018   GFRAA >60 05/10/2017 0018    COAG Lab Results  Component Value Date   INR 1.11 05/09/2017   INR 1.0 07/30/2007   No results found for: PTT  Physical Examination  BP  Readings from Last 3 Encounters:  05/29/17 133/83  05/28/17 122/66  05/12/17 122/77   Temp Readings from Last 3 Encounters:  05/29/17 97.9 F (36.6 C) (Oral)  05/12/17 98.4 F (36.9 C) (Oral)  05/07/17 98.8 F (37.1 C) (Oral)    Pulse Readings from Last 3 Encounters:  05/29/17 91  05/28/17 68  05/12/17 (!) 101    Palpable DP pulses 2+ B Left leg incisions well healed with out skin break down.   Un able to fully extend left knee -7-10 degrees of extension. Left groin with superficial skin breakdown.  No frank infection, no drainage, slight pink discoloration at skin edges.   Heart RRR Lungs non labored breathing CTA   Assessment/Plan: S/P PROCEDURE: 1.  Left iliofemoral endarterectomy with Dacron patch angioplasty 2.  Left common femoral artery to below-the-knee popliteal artery bypass with Propaten 3.  Linton patch placement on below-the-knee popliteal artery  Residual skin glue was removed, staples were removed.  Patient tolerated this well.  The left groin was cleaned and dry 4 x 4 was placed over incisional area.    His by pass is patent with palpable DP pulse B equal.  His post op ABI were performed at the hospital and showed Left 0.91 with TBI of 0.44, right 0.74 with TBI 0.56.  I have asked him  to be diligent and wash the groin area daily, dry it well and cover it with a dry dressing.    He has developed a left knee contracture.  I have ordered out patient PT for mobility and left knee contracture therapeutic exercises.   He will f/u with Dr. Imogene Burn in 4 weeks.  If he has problems or concerns prior to this he will call.  I gave him a refill of Norco 5/325 # 20.  He can stop his Plavix and start 81 mg Aspirin daily.  [name]  05/29/2017 3:49 PM

## 2017-06-11 ENCOUNTER — Other Ambulatory Visit: Payer: Self-pay | Admitting: Adult Health

## 2017-06-17 ENCOUNTER — Ambulatory Visit (INDEPENDENT_AMBULATORY_CARE_PROVIDER_SITE_OTHER): Payer: Self-pay | Admitting: Family

## 2017-06-17 ENCOUNTER — Encounter: Payer: Self-pay | Admitting: Family

## 2017-06-17 ENCOUNTER — Telehealth: Payer: Self-pay | Admitting: *Deleted

## 2017-06-17 VITALS — BP 110/72 | HR 84 | Temp 97.0°F | Resp 16 | Ht 72.0 in | Wt 182.4 lb

## 2017-06-17 DIAGNOSIS — Z95828 Presence of other vascular implants and grafts: Secondary | ICD-10-CM

## 2017-06-17 DIAGNOSIS — Z8673 Personal history of transient ischemic attack (TIA), and cerebral infarction without residual deficits: Secondary | ICD-10-CM

## 2017-06-17 DIAGNOSIS — F17200 Nicotine dependence, unspecified, uncomplicated: Secondary | ICD-10-CM

## 2017-06-17 DIAGNOSIS — I779 Disorder of arteries and arterioles, unspecified: Secondary | ICD-10-CM

## 2017-06-17 DIAGNOSIS — E1151 Type 2 diabetes mellitus with diabetic peripheral angiopathy without gangrene: Secondary | ICD-10-CM

## 2017-06-17 MED ORDER — CEPHALEXIN 500 MG PO CAPS: 500.0000 mg | ORAL_CAPSULE | Freq: Three times a day (TID) | ORAL | 0 refills | Status: DC

## 2017-06-17 NOTE — Telephone Encounter (Signed)
Patient called c/o" open area approx 1 1/2 inches at groin incision. Red and some drainage". Unable to determine exactly how the patient is treating this area. Decided to have patient come in for wound check today with NP.

## 2017-06-17 NOTE — Progress Notes (Signed)
Postoperative Visit   History of Present Illness  Willie Morales is a 57 y.o. year old male who is s/p left iliofemoral endarterectomy with Dacron patch angioplasty, leftcommon femoral artery to below-the-knee popliteal artery bypass with Propaten, and Linton patch placement on below-the-kneepopliteal artery on 05-09-17 by Dr. Imogene Burn.    Prior procedure include: 1. L SFA PTA+S (12/06/16) 2. OA+DCB PTA R fem-pop (07/19/16)for sub-total occlusion of R SFA and pop 3. L SFA SIA x 2 (07/05/16)for L SFA occlusion   He was evaluated on 05-29-17 by M. Collins PA-C and Dr. Imogene Burn. At that time pt had palpable DP pulses 2+ B Left leg incisions well healed with out skin break down.   Unable to fully extend left knee -7-10 degrees of extension. Left groin with superficial skin breakdown.  No frank infection, no drainage, slight pink discoloration at skin edges.   Residual skin glue was removed, staples were removed. The left groin was cleaned and dry 4 x 4 was placed over incisional area.   His by pass was patent with palpable DP pulse B equal.  His post op ABI were performed at the hospital and showed Left 0.91 with TBI of 0.44, right 0.74 with TBI 0.56.  Narda Amber PA-C asked pt to be diligent and wash the groin area daily, dry it well and cover it with a dry dressing.   Pt developed a left knee contracture.  Narda Amber PA-C ordered out patient PT for mobility and left knee contracture therapeutic exercises.   Pt was to f/u with Dr. Imogene Burn in 4 weeks.  If he has problems or concerns prior to this he will call. M. Colllins PA-C gave pt a refill of Norco 5/325 # 20.  He can stop his Plavix and start 81 mg Aspirin daily.  He returns today with c/o " open area approx 1 1/2 inches at groin incision. Red and some drainage". Pt has been showering and cleansing his left groin daily.  He has not been contacted re physical therapy, but he is working his left knee and the contracture is resolving.   His left  leg incisions are healing well.  He notes much improved circulation in his left leg.    He continues to smoke. His DM is not in control, last A1C result on file was 7.3 on 05-10-17.   The patient is able to complete their activities of daily living.    For VQI Use Only  PRE-ADM LIVING: Home  AMB STATUS: Ambulatory    Past Medical History:  Diagnosis Date  . Coronary artery disease   . Diabetes mellitus   . Dyslipidemia   . History of kidney stones   . Hypertension   . Paroxysmal atrial fibrillation (HCC) 12/2010  . Tobacco abuse     Past Surgical History:  Procedure Laterality Date  . ABDOMINAL AORTOGRAM W/LOWER EXTREMITY N/A 07/05/2016   Procedure: Abdominal Aortogram w/Lower Extremity;  Surgeon: Fransisco Hertz, MD;  Location: [facility] INVASIVE CV LAB;  Service: Cardiovascular;  Laterality: N/A;  . ABDOMINAL AORTOGRAM W/LOWER EXTREMITY N/A 05/06/2017   Procedure: ABDOMINAL AORTOGRAM W/LOWER EXTREMITY;  Surgeon: Maeola Harman, MD;  [facility] INVASIVE CV LAB;  Service: Cardiovascular;  Laterality: N/A;  . BACK SURGERY  1991  . CARDIAC CATHETERIZATION  07/31/2007   This showed nonobstructive coronary artery disease   He had 30% lesion in the mid RCA, 30% lesion in the PDA, 45% lesion in the LAD and mid  LAD, and 30%  lesion in the diagonal.  The left circumflex was normal.  The LV function was 60%.     Marland Kitchen ENDARTERECTOMY FEMORAL Left 05/09/2017   Procedure: ENDARTERECTOMY FEMORAL WITH DACRON PATCH ANGIOPLASTY;  Surgeon: Fransisco Hertz, MD;  Location: [facility] OR;  Service: Vascular;  Laterality: Left;  . FEMORAL-POPLITEAL BYPASS GRAFT Left 05/09/2017   Procedure: BYPASS GRAFT FEMORAL-POPLITEAL ARTERY LEFT;  Surgeon: Fransisco Hertz, MD;  Location: [facility] OR;  Service: Vascular;  Laterality: Left;  . KIDNEY STONE SURGERY  2011  . LOWER EXTREMITY ANGIOGRAPHY N/A 12/06/2016   Procedure: Lower Extremity Angiography;  Surgeon: Fransisco Hertz, MD;  Location: [facility] INVAS[facility] CV LAB;  Service:  Cardiovascular;  Laterality: N/A;  . PATCH ANGIOPLASTY Left 05/09/2017   Procedure: PATCH ANGIOPLASTY WITH PROPATEN GRAFT  OF BELOW KNEE POPLITEAL ARTERY;  Surgeon: Fransisco Hertz, MD;  Location: [facility] OR;  Service: Vascular;  Laterality: Left;  . PERIPHERAL VASCULAR ATHERECTOMY Right 07/19/2016   Procedure: Peripheral Vascular Atherectomy;  Surgeon: Fransisco Hertz, MD;  Location: [facility] INVASIVE CV LAB;  Service: Cardiovascular;  Laterality: Right;  . PERIPHERAL VASCULAR BALLOON ANGIOPLASTY Left 07/05/2016   Procedure: Peripheral Vascular Balloon Angioplasty;  Surgeon: Fransisco Hertz, MD;  Location: [facility] INVASIVE CV LAB;  Service: Cardiovascular;  Laterality: Left;  SFA  . PERIPHERAL VASCULAR INTERVENTION  12/06/2016   Procedure: PERIPHERAL VASCULAR INTERVENTION;  Surgeon: Fransisco Hertz, MD;  Location: [facility] INVASIVE CV LAB;  Service: Cardiovascular;;  LEFT    Social History   Socioeconomic History  . Marital status: Married    Spouse name: Not on file  . Number of children: Not on file  . Years of education: Not on file  . Highest education level: Not on file  Occupational History  . Occupation: Nutritional therapist    Comment: by trade  Social Needs  . Financial resource strain: Not on file  . Food insecurity:    Worry: Not on file    Inability: Not on file  . Transportation needs:    Medical: Not on file    Non-medical: Not on file  Tobacco Use  . Smoking status: Current Every Day Smoker    Packs/day: 1.00    Years: 35.00    Pack years: 35.00    Types: Cigarettes    Last attempt to quit: 12/18/2015    Years since quitting: 1.4  . Smokeless tobacco: Never Used  . Tobacco comment: 3/4 pack per day  Substance and Sexual Activity  . Alcohol use: No  . Drug use: No  . Sexual activity: Not on file  Lifestyle  . Physical activity:    Days per week: Not on file    Minutes per session: Not on file  . Stress: Not on file  Relationships  . Social connections:    Talks on phone: Not on file    Gets  together: Not on file    Attends religious service: Not on file    Active member of club or organization: Not on file    Attends meetings of clubs or organizations: Not on file    Relationship status: Not on file  . Intimate partner violence:    Fear of current or ex partner: Not on file    Emotionally abused: Not on file    Physically abused: Not on file    Forced sexual activity: Not on file  Other Topics Concern  . Not on file  Social History Narrative   Has 2 children    Lives  in [place] with wife   Currently unemployed    Family History  Problem Relation Age of Onset  . Coronary artery disease Father        CABG in early 52's  . Heart attack Father   . Heart disease Father   . Cancer Mother   . Heart disease Mother   . Heart disease Brother   . Diabetes Maternal Grandmother   . SIDS Sister     Allergies  Allergen Reactions  . Ciprofloxacin Rash    Current Outpatient Medications on File Prior to Visit  Medication Sig Dispense Refill  . apixaban (ELIQUIS) 5 MG TABS tablet Take 1 tablet (5 mg total) by mouth 2 (two) times daily. 60 tablet 6  . atorvastatin (LIPITOR) 10 MG tablet Take 10 mg by mouth daily.   1  . benazepril (LOTENSIN) 10 MG tablet Take 1 tablet (10 mg total) by mouth daily. 30 tablet 6  . clopidogrel (PLAVIX) 75 MG tablet Take 1 tablet (75 mg total) by mouth daily. 30 tablet 11  . gabapentin (NEURONTIN) 300 MG capsule Take 1 capsule (300 mg total) by mouth 3 (three) times daily. (Patient taking differently: Take 300 mg by mouth daily. ) 90 capsule 11  . glimepiride (AMARYL) 4 MG tablet Take 1 tablet (4 mg total) by mouth daily with breakfast. 30 tablet 6  . HYDROcodone-acetaminophen (NORCO/VICODIN) 5-325 MG tablet Take 1 tablet by mouth every 6 (six) hours as needed for moderate pain. 20 tablet 0  . levothyroxine (SYNTHROID, LEVOTHROID) 25 MCG tablet Take 25 mcg by mouth daily before breakfast.     . metoprolol tartrate (LOPRESSOR) 25 MG tablet  TAKE ONE TABLET BY MOUTH TWICE DAILY 60 tablet 6  . sitaGLIPtin-metformin (JANUMET) 50-1000 MG tablet Take 1 tablet by mouth 2 (two) times daily.     No current facility-administered medications on file prior to visit.       Physical Examination  Vitals:   06/17/17 1409  BP: 110/72  Pulse: 84  Resp: 16  Temp: (!) 97 F (36.1 C)  TempSrc: Oral  SpO2: 100%  Weight: 182 lb 6.4 oz (82.7 kg)  Height: 6' (1.829 m)   Body mass index is 24.74 kg/m.    Left groin, 3.75 cm x 1.75 cm wound with fibrinous exudate base. Depth probes to 1 cm laterally with sterile cotton swab    Bilateral DP pulses are 1+ palpable.  Left leg incisions are all healing well  Medical Decision Making  DORIN STOOKSBURY is a 57 y.o. year old male who presents s/p left iliofemoral endarterectomy with Dacron patch angioplasty, leftcommon femoral artery to below-the-knee popliteal artery bypass with Propaten, and Linton patch placement on below-the-kneepopliteal artery on 05-09-17 by Dr. Imogene Burn.   Pt noted an opeinng in his left groin incision a few days ago, has no fever or chills, his left leg feel improved since the above procedure; he reports drainage as watery and brown tinged.   Dr. Myra Gianotti spoke with and examined pt, probed left groin wound with sterile cotton swab. Left groin wound packed with NS dampened sterile gauze, more 4x4's applied atop this. HH to monitor daily or bid  dressing changes: W to D.   Keflex 500 mg po tid x 2 weeks sent to his pharmacy.   Follow up with Dr. Imogene Burn next week for wound check.   I discussed in depth with the patient the nature of atherosclerosis, and emphasized the importance of maximal medical management including strict  control of blood pressure, blood glucose, and lipid levels, obtaining regular exercise, and cessation of smoking.  The patient is aware that without maximal medical management the underlying atherosclerotic disease process will progress, limiting the  benefit of any interventions.   Thank you for allowing [place] to participate in this patient's care.  Charisse March, RN, MSN, FNP-C [facility] [place] Office: [phone]  06/17/2017, 2:28 PM  Clinic MD: Myra Gianotti

## 2017-06-17 NOTE — Progress Notes (Signed)
Pt. Is here for his left groin wound open up.

## 2017-06-19 ENCOUNTER — Other Ambulatory Visit: Payer: Self-pay

## 2017-06-19 DIAGNOSIS — I779 Disorder of arteries and arterioles, unspecified: Secondary | ICD-10-CM

## 2017-06-26 ENCOUNTER — Other Ambulatory Visit: Payer: Self-pay

## 2017-06-26 ENCOUNTER — Ambulatory Visit (INDEPENDENT_AMBULATORY_CARE_PROVIDER_SITE_OTHER): Payer: Self-pay | Admitting: Physician Assistant

## 2017-06-26 VITALS — BP 97/69 | HR 81 | Temp 97.5°F | Resp 16 | Ht 72.0 in | Wt 181.0 lb

## 2017-06-26 DIAGNOSIS — I779 Disorder of arteries and arterioles, unspecified: Secondary | ICD-10-CM

## 2017-06-26 DIAGNOSIS — T8189XA Other complications of procedures, not elsewhere classified, initial encounter: Secondary | ICD-10-CM

## 2017-06-26 NOTE — Progress Notes (Signed)
Willie Morales is a 57 y.o. year old male who is s/p left iliofemoral endarterectomy with Dacron patch angioplasty, leftcommon femoral artery to below-the-knee popliteal artery bypass with Propaten, and Linton patch placement on below-the-kneepopliteal artery on 05-09-17 by Dr. Imogene Burn.    Prior procedure include: 1. L SFA PTA+S (12/06/16) 2. OA+DCB PTA R fem-pop (07/19/16)for sub-total occlusion of R SFA and pop 3. L SFA SIA x 2 (07/05/16)for L SFA occlusion   He was last seen on 06/17/2017.  At this time his by pass was patent with palpable DP pulse B equal. His post op ABI were performed at the hospital and showed Left 0.91 with TBI of 0.44, right 0.74 with TBI 0.56.  Further wound breakdown occurred in the left groin, 3.75 cm x 1.75 cm with fibrinous exudate base.  The wound was packed NS dampened sterile gauze, more 4x4's applied atop this.  HH was ordered to monitor daily  dressing changes: wet to dry dressing changes daily.  He was placed on oral  Keflex 500 TID for 14 days.    He is here today for f/u wound check.        Past Medical History:  Diagnosis Date  . Coronary artery disease   . Diabetes mellitus   . Dyslipidemia   . History of kidney stones   . Hypertension   . Paroxysmal atrial fibrillation (HCC) 12/2010  . Tobacco abuse     Past Surgical History:  Procedure Laterality Date  . ABDOMINAL AORTOGRAM W/LOWER EXTREMITY N/A 07/05/2016   Procedure: Abdominal Aortogram w/Lower Extremity;  Surgeon: Fransisco Hertz, MD;  Location: [facility] INVASIVE CV LAB;  Service: Cardiovascular;  Laterality: N/A;  . ABDOMINAL AORTOGRAM W/LOWER EXTREMITY N/A 05/06/2017   Procedure: ABDOMINAL AORTOGRAM W/LOWER EXTREMITY;  Surgeon: Maeola Harman, MD;  Location: [facility] INVASIVE CV LAB;  Service: Cardiovascular;  Laterality: N/A;  . BACK SURGERY  1991  . CARDIAC CATHETERIZATION  07/31/2007   This showed nonobstructive coronary artery disease   He had 30% lesion in the mid RCA, 30%  lesion in the PDA, 45% lesion in the LAD and mid  LAD, and 30% lesion in the diagonal.  The left circumflex was normal.  The LV function was 60%.     Marland Kitchen ENDARTERECTOMY FEMORAL Left 05/09/2017   Procedure: ENDARTERECTOMY FEMORAL WITH DACRON PATCH ANGIOPLASTY;  Surgeon: Fransisco Hertz, MD;  Location: [facility] OR;  Service: Vascular;  Laterality: Left;  . FEMORAL-POPLITEAL BYPASS GRAFT Left 05/09/2017   Procedure: BYPASS GRAFT FEMORAL-POPLITEAL ARTERY LEFT;  Surgeon: Fransisco Hertz, MD;  Location: [facility] OR;  Service: Vascular;  Laterality: Left;  . KIDNEY STONE SURGERY  2011  . LOWER EXTREMITY ANGIOGRAPHY N/A 12/06/2016   Procedure: Lower Extremity Angiography;  Surgeon: Fransisco Hertz, MD;  Location: [facility] INVASIVE CV LAB;  Service: Cardiovascular;  Laterality: N/A;  . PATCH ANGIOPLASTY Left 05/09/2017   Procedure: PATCH ANGIOPLASTY WITH PROPATEN GRAFT  OF BELOW KNEE POPLITEAL ARTERY;  Surgeon: Fransisco Hertz, MD;  Location: [facility] OR;  Service: Vascular;  Laterality: Left;  . PERIPHERAL VASCULAR ATHERECTOMY Right 07/19/2016   Procedure: Peripheral Vascular Atherectomy;  Surgeon: Fransisco Hertz, MD;  Location: [facility] INVASIVE CV LAB;  Service: Cardiovascular;  Laterality: Right;  . PERIPHERAL VASCULAR BALLOON ANGIOPLASTY Left 07/05/2016   Procedure: Peripheral Vascular Balloon Angioplasty;  Surgeon: Fransisco Hertz, MD;  Location: [facility] INVASIVE CV LAB;  Service: Cardiovascular;  Laterality: Left;  SFA  . PERIPHERAL VASCULAR INTERVENTION  12/06/2016  Procedure: PERIPHERAL VASCULAR INTERVENTION;  Surgeon: Fransisco Hertz, MD;  Location: [facility] INVASIVE CV LAB;  Service: Cardiovascular;;  LEFT    ROS:   General:  No weight loss, Fever, chills  HEENT: No recent headaches, no nasal bleeding, no visual changes, no sore throat  Neurologic: No dizziness, blackouts, seizures. No recent symptoms of stroke or mini- stroke. No recent episodes of slurred speech, or temporary blindness.  Cardiac: No recent episodes of chest pain/pressure, no  shortness of breath at rest.  No shortness of breath with exertion.  Denies history of atrial fibrillation or irregular heartbeat  Vascular: No history of rest pain in feet.  No history of claudication.  No history of non-healing ulcer, No history of DVT   Pulmonary: No home oxygen, no productive cough, no hemoptysis,  No asthma or wheezing  Musculoskeletal:   Arthritis,  Low back pain,   Joint pain  Hematologic:No history of hypercoagulable state.  No history of easy bleeding.  No history of anemia  Gastrointestinal: No hematochezia or melena,  No gastroesophageal reflux, no trouble swallowing  Urinary:  chronic Kidney disease,  on HD -  MWF or  TTHS,  Burning with urination,  Frequent urination,  Difficulty urinating;   Skin: left groin wound, surgical incision  Psychological: No history of anxiety,  No history of depression  Social History Social History   Tobacco Use  . Smoking status: Current Every Day Smoker    Packs/day: 1.00    Years: 35.00    Pack years: 35.00    Types: Cigarettes    Last attempt to quit: 12/18/2015    Years since quitting: 1.5  . Smokeless tobacco: Never Used  . Tobacco comment: 3/4 pack per day  Substance Use Topics  . Alcohol use: No  . Drug use: No    Family History Family History  Problem Relation Age of Onset  . Coronary artery disease Father        CABG in early 67's  . Heart attack Father   . Heart disease Father   . Cancer Mother   . Heart disease Mother   . Heart disease Brother   . Diabetes Maternal Grandmother   . SIDS Sister     Allergies  Allergies  Allergen Reactions  . Ciprofloxacin Rash     Current Outpatient Medications  Medication Sig Dispense Refill  . apixaban (ELIQUIS) 5 MG TABS tablet Take 1 tablet (5 mg total) by mouth 2 (two) times daily. 60 tablet 6  . atorvastatin (LIPITOR) 10 MG tablet Take 10 mg by mouth daily.   1  . benazepril (LOTENSIN) 10 MG tablet Take 1 tablet (10  mg total) by mouth daily. 30 tablet 6  . cephALEXin (KEFLEX) 500 MG capsule Take 1 capsule (500 mg total) by mouth 3 (three) times daily. 42 capsule 0  . clopidogrel (PLAVIX) 75 MG tablet Take 1 tablet (75 mg total) by mouth daily. 30 tablet 11  . gabapentin (NEURONTIN) 300 MG capsule Take 1 capsule (300 mg total) by mouth 3 (three) times daily. (Patient taking differently: Take 300 mg by mouth daily. ) 90 capsule 11  . glimepiride (AMARYL) 4 MG tablet Take 1 tablet (4 mg total) by mouth daily with breakfast. 30 tablet 6  . HYDROcodone-acetaminophen (NORCO/VICODIN) 5-325 MG tablet Take 1 tablet by mouth every 6 (six) hours as needed for moderate pain. 20 tablet 0  . levothyroxine (SYNTHROID, LEVOTHROID) 25 MCG tablet  Take 25 mcg by mouth daily before breakfast.     . metoprolol tartrate (LOPRESSOR) 25 MG tablet TAKE ONE TABLET BY MOUTH TWICE DAILY 60 tablet 6  . sitaGLIPtin-metformin (JANUMET) 50-1000 MG tablet Take 1 tablet by mouth 2 (two) times daily.     No current facility-administered medications for this visit.     Physical Examination  There were no vitals filed for this visit.  There is no height or weight on file to calculate BMI.  General:  Alert and oriented, no acute distress HEENT: Normal Neck: No bruit or JVD Pulmonary: Clear to auscultation bilaterally Cardiac: Regular Rate and Rhythm without murmur Abdomen: Soft, non-tender, non-distended, no mass, no scars Skin: yellow eschar base with areas of beefy red healing.  No erythema or active drainage.  No visible bypass graft in the base of the wound. Extremity Pulses: Palpable DP pulses 2+ B    Musculoskeletal: positive left LE edema, knee extension -5 degrees improved.  Neurologic: Upper and lower extremity motor grossly intact B   ASSESSMENT: S/P left fem-pop by pass with groin wound dehiscence.   PLAN:  He will continue Keflex TID until they are gone.  Daily wet to dry dressing changes.  HH never showed up or  called.  At this point he is comfortable doing his own dressing changes and demonstrated this in the office today.    Encouraged ambulation as tolerates and elevation of the left LE when at rest for edema.  Patent by pass with palpable pedal pulse.     [name] PA-C [facility]  MD in clinic Dr. Imogene Burn

## 2017-07-10 ENCOUNTER — Other Ambulatory Visit: Payer: Self-pay

## 2017-07-10 ENCOUNTER — Encounter (HOSPITAL_COMMUNITY): Payer: BLUE CROSS/BLUE SHIELD

## 2017-07-10 ENCOUNTER — Ambulatory Visit (INDEPENDENT_AMBULATORY_CARE_PROVIDER_SITE_OTHER): Payer: Self-pay | Admitting: Vascular Surgery

## 2017-07-10 ENCOUNTER — Encounter: Payer: Self-pay | Admitting: Vascular Surgery

## 2017-07-10 VITALS — BP 109/71 | HR 86 | Temp 97.4°F | Resp 18 | Ht 72.0 in | Wt 182.0 lb

## 2017-07-10 DIAGNOSIS — I70229 Atherosclerosis of native arteries of extremities with rest pain, unspecified extremity: Secondary | ICD-10-CM

## 2017-07-10 NOTE — Progress Notes (Signed)
    Postoperative Visit   History of Present Illness   Willie Morales is a 57 y.o. year old male who presents for postoperative follow-up for: L iliofem EA w/ DPA, L CFA to BK pop BPG w/ Propaten, Linton patch on BK pop (05/09/17).  The patient has been following up with my PA rather than myself as he has missed his appointments.  The patient's wounds are healing.  The patient notes near total resolution of lower extremity symptoms.  The patient is able to complete their activities of daily living.  The patient's current symptoms are: intermittent swelling in L leg.  Pt is doing wet-to-dry dressing to L groin.   For VQI Use Only   PRE-ADM LIVING: Home  AMB STATUS: Ambulatory   Physical Examination   Vitals:   07/10/17 1203  BP: 109/71  Pulse: 86  Resp: 18  Temp: (!) 97.4 F (36.3 C)  TempSrc: Oral  SpO2: 100%  Weight: 182 lb (82.6 kg)  Height: 6' (1.829 m)    LLE: Vein harvest and calf incision healed, eschar in L groin trimmed, good granulation in L groin, wound ~1 cm deep, no drainage, no odor, no erythema,    Medical Decision Making   Willie Morales is a 57 y.o. year old male who presents s/p L iliofem EA w/ DPA, L CFA to BK pop BPG w/ Propaten, Linton patch on BK pop.   The patient's bypass incisions are healing appropriately with resolution of pre-operative symptoms.  Would continue wet-to-dry dressing to L groin.  Wash L groin wound with soap and water daily and dry.  I expect the L groin to complete heal in 2-4 weeks.  I discussed in depth with the patient the nature of atherosclerosis, and emphasized the importance of maximal medical management including strict control of blood pressure, blood glucose, and lipid levels, obtaining regular exercise, and cessation of smoking.  The patient is aware that without maximal medical management the underlying atherosclerotic disease process will progress, limiting the benefit of any interventions. The patient's surveillance  will included ABI and bypass duplex studies which will be completed in: 3 months, at which time the patient will be re-evaluated.   I emphasized the importance of routine surveillance of the patient's bypass, as the vascular surgery literature emphasize the improved patency possible with assisted primary patency procedures versus secondary patency procedures. The patient agrees to participate in their maximal medical care and routine surveillance. Thank you for allowing [place] to participate in this patient's care.   Leonides Sake, MD, FACS [facility] [place] Office: [phone] Pager: [phone]

## 2017-07-17 ENCOUNTER — Other Ambulatory Visit: Payer: Self-pay

## 2017-07-17 DIAGNOSIS — Z95828 Presence of other vascular implants and grafts: Secondary | ICD-10-CM

## 2017-07-17 DIAGNOSIS — I779 Disorder of arteries and arterioles, unspecified: Secondary | ICD-10-CM

## 2017-07-17 DIAGNOSIS — I70229 Atherosclerosis of native arteries of extremities with rest pain, unspecified extremity: Secondary | ICD-10-CM

## 2017-08-07 ENCOUNTER — Ambulatory Visit (INDEPENDENT_AMBULATORY_CARE_PROVIDER_SITE_OTHER)
Admission: RE | Admit: 2017-08-07 | Discharge: 2017-08-07 | Disposition: A | Discharge status: Home / Self Care | Payer: BLUE CROSS/BLUE SHIELD | Source: Ambulatory Visit | Attending: Family | Admitting: Family

## 2017-08-07 ENCOUNTER — Ambulatory Visit (HOSPITAL_COMMUNITY)
Admission: RE | Admit: 2017-08-07 | Discharge: 2017-08-07 | Disposition: A | Discharge status: Home / Self Care | Payer: BLUE CROSS/BLUE SHIELD | Source: Ambulatory Visit | Attending: Family | Admitting: Family

## 2017-08-07 DIAGNOSIS — Z95828 Presence of other vascular implants and grafts: Secondary | ICD-10-CM

## 2017-08-07 DIAGNOSIS — I779 Disorder of arteries and arterioles, unspecified: Secondary | ICD-10-CM | POA: Insufficient documentation

## 2017-08-07 DIAGNOSIS — F17200 Nicotine dependence, unspecified, uncomplicated: Secondary | ICD-10-CM | POA: Insufficient documentation

## 2017-08-07 DIAGNOSIS — I70229 Atherosclerosis of native arteries of extremities with rest pain, unspecified extremity: Secondary | ICD-10-CM

## 2017-08-13 NOTE — Progress Notes (Signed)
  Established Previous Bypass   History of Present Illness   Willie Morales is a 57 y.o. (01/17/1961) male who presents with chief complaint: nearly healed L groin.    Prior procedures include: 1.  L iliofem EA w/ DPA, L CFA to BK pop BPG w/ Propaten, Linton patch (05/09/17)   The patient's symptoms have not progressed.  The patient's symptoms are: scab in L groin. The patient's treatment regimen currently included: maximal medical management.  The patient's PMH, PSH, SH, and FamHx were reviewed on 08/14/17 are unchanged from 07/10/17.  Current Outpatient Medications  Medication Sig Dispense Refill  . apixaban (ELIQUIS) 5 MG TABS tablet Take 1 tablet (5 mg total) by mouth 2 (two) times daily. 60 tablet 6  . atorvastatin (LIPITOR) 10 MG tablet Take 10 mg by mouth daily.   1  . benazepril (LOTENSIN) 10 MG tablet Take 1 tablet (10 mg total) by mouth daily. 30 tablet 6  . cephALEXin (KEFLEX) 500 MG capsule Take 1 capsule (500 mg total) by mouth 3 (three) times daily. 42 capsule 0  . clopidogrel (PLAVIX) 75 MG tablet Take 1 tablet (75 mg total) by mouth daily. 30 tablet 11  . gabapentin (NEURONTIN) 300 MG capsule Take 1 capsule (300 mg total) by mouth 3 (three) times daily. (Patient taking differently: Take 300 mg by mouth daily. ) 90 capsule 11  . glimepiride (AMARYL) 4 MG tablet Take 1 tablet (4 mg total) by mouth daily with breakfast. 30 tablet 6  . HYDROcodone-acetaminophen (NORCO/VICODIN) 5-325 MG tablet Take 1 tablet by mouth every 6 (six) hours as needed for moderate pain. 20 tablet 0  . levothyroxine (SYNTHROID, LEVOTHROID) 25 MCG tablet Take 25 mcg by mouth daily before breakfast.     . metoprolol tartrate (LOPRESSOR) 25 MG tablet TAKE ONE TABLET BY MOUTH TWICE DAILY 60 tablet 6  . sitaGLIPtin-metformin (JANUMET) 50-1000 MG tablet Take 1 tablet by mouth 2 (two) times daily.     No current facility-administered medications for this visit.     On ROS today: no intermittent  claudication , no fever or chills   Physical Examination   Vitals:   08/14/17 1344  BP: (!) 88/58  Pulse: 88  Temp: (!) 97 F (36.1 C)  TempSrc: Oral  SpO2: 98%  Weight: 175 lb (79.4 kg)  Height: 6' (1.829 m)   Body mass index is 23.73 kg/m.  General Alert, O x 3, WD, NAD  Pulmonary Sym exp, good B air movt, CTA B  Cardiac RRR, Nl S1, S2, no Murmurs, No rubs, No S3,S4  Vascular Vessel Right Left  Radial Palpable Palpable  Brachial Palpable Palpable  Carotid Palpable, No Bruit Palpable, No Bruit  Aorta Not palpable N/A  Femoral Palpable Palpable  Popliteal Not palpable Not palpable  PT Not palpable Faintly palpable  DP Not palpable Faintly palpable    Gastro- intestinal soft, non-distended, non-tender to palpation, No guarding or rebound, no HSM, no masses, no CVAT B, No palpable prominent aortic pulse,    Musculo- skeletal M/S 5/5 throughout  , Extremities without ischemic changes  , No edema present, No visible varicosities , No Lipodermatosclerosis present, scab in L groin, L calf healed  Neurologic Pain and light touch intact in extremities , Motor exam as listed above    Non-Invasive Vascular Imaging ABI (08/07/17)  R:   ABI: 0.96 (0.74),   PT: mono  DP: mono  TBI:  0.55  L:   ABI: 0.8l (0.91, 0.37 preop),     PT: mono  DP: mono  TBI: 0.58  Bypass Duplex (08/07/17)  Proximal to bypass: 104 c/s  With-in bypass: 27-82 c/s  Distal to bypass: 438 c/s   Medical Decision Making   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   Based on arterial duplex, would recommend: left leg runoff and possible intervention due to elevated PSV distal to bypass, 438 c/s. I discussed with the patient the nature of angiographic procedures, especially the limited patencies of any endovascular intervention.   The patient is aware of that  the risks of an angiographic procedure include but are not limited to: bleeding, infection, access site complications, renal failure, embolization, rupture of vessel, dissection, arteriovenous fistula, possible need for emergent surgical intervention, possible need for surgical procedures to treat the patient's pathology, anaphylactic reaction to contrast, and stroke and death.   The patient is aware of the risks and agrees to proceed.  He is schedule for the 18 JUL 19.  I discussed in depth with the patient the nature of atherosclerosis, and emphasized the importance of maximal medical management including strict control of blood pressure, blood glucose, and lipid levels, obtaining regular exercise, and cessation of smoking.    The patient is aware that without maximal medical management the underlying atherosclerotic disease process will progress, limiting the benefit of any interventions.  I discussed in depth with the patient the need for long term surveillance to improve the primary assisted patency of his bypass.  The patient agrees to cooperate with such.  The patient is currently on a statin: Lipitor.   The patient is currently on an anti-platelet: Plavix.   Pt is also on Eliquis.  Thank you for allowing us to participate in this patient's care.   Samantha Ragen, MD, FACS [facility] [place] Office: [phone] Pager: [phone]  

## 2017-08-13 NOTE — H&P (View-Only) (Signed)
Established Previous Bypass   History of Present Illness   Willie Morales is a 57 y.o. (03/07/1960) male who presents with chief complaint: nearly healed L groin.    Prior procedures include: 1.  L iliofem EA w/ DPA, L CFA to BK pop BPG w/ Propaten, Linton patch (05/09/17)   The patient's symptoms have not progressed.  The patient's symptoms are: scab in L groin. The patient's treatment regimen currently included: maximal medical management.  The patient's PMH, PSH, SH, and FamHx were reviewed on 08/14/17 are unchanged from 07/10/17.  Current Outpatient Medications  Medication Sig Dispense Refill  . apixaban (ELIQUIS) 5 MG TABS tablet Take 1 tablet (5 mg total) by mouth 2 (two) times daily. 60 tablet 6  . atorvastatin (LIPITOR) 10 MG tablet Take 10 mg by mouth daily.   1  . benazepril (LOTENSIN) 10 MG tablet Take 1 tablet (10 mg total) by mouth daily. 30 tablet 6  . cephALEXin (KEFLEX) 500 MG capsule Take 1 capsule (500 mg total) by mouth 3 (three) times daily. 42 capsule 0  . clopidogrel (PLAVIX) 75 MG tablet Take 1 tablet (75 mg total) by mouth daily. 30 tablet 11  . gabapentin (NEURONTIN) 300 MG capsule Take 1 capsule (300 mg total) by mouth 3 (three) times daily. (Patient taking differently: Take 300 mg by mouth daily. ) 90 capsule 11  . glimepiride (AMARYL) 4 MG tablet Take 1 tablet (4 mg total) by mouth daily with breakfast. 30 tablet 6  . HYDROcodone-acetaminophen (NORCO/VICODIN) 5-325 MG tablet Take 1 tablet by mouth every 6 (six) hours as needed for moderate pain. 20 tablet 0  . levothyroxine (SYNTHROID, LEVOTHROID) 25 MCG tablet Take 25 mcg by mouth daily before breakfast.     . metoprolol tartrate (LOPRESSOR) 25 MG tablet TAKE ONE TABLET BY MOUTH TWICE DAILY 60 tablet 6  . sitaGLIPtin-metformin (JANUMET) 50-1000 MG tablet Take 1 tablet by mouth 2 (two) times daily.     No current facility-administered medications for this visit.     On ROS today: no intermittent  claudication , no fever or chills   Physical Examination   Vitals:   08/14/17 1344  BP: (!) 88/58  Pulse: 88  Temp: (!) 97 F (36.1 C)  TempSrc: Oral  SpO2: 98%  Weight: 175 lb (79.4 kg)  Height: 6' (1.829 m)   Body mass index is 23.73 kg/m.  General Alert, O x 3, WD, NAD  Pulmonary Sym exp, good B air movt, CTA B  Cardiac RRR, Nl S1, S2, no Murmurs, No rubs, No S3,S4  Vascular Vessel Right Left  Radial Palpable Palpable  Brachial Palpable Palpable  Carotid Palpable, No Bruit Palpable, No Bruit  Aorta Not palpable N/A  Femoral Palpable Palpable  Popliteal Not palpable Not palpable  PT Not palpable Faintly palpable  DP Not palpable Faintly palpable    Gastro- intestinal soft, non-distended, non-tender to palpation, No guarding or rebound, no HSM, no masses, no CVAT B, No palpable prominent aortic pulse,    Musculo- skeletal M/S 5/5 throughout  , Extremities without ischemic changes  , No edema present, No visible varicosities , No Lipodermatosclerosis present, scab in L groin, L calf healed  Neurologic Pain and light touch intact in extremities , Motor exam as listed above    Non-Invasive Vascular Imaging ABI (08/07/17)  R:   ABI: 0.96 (0.74),   PT: mono  DP: mono  TBI:  0.55  L:   ABI: 0.8l (0.91, 0.37 preop),  PT: mono  DP: mono  TBI: 0.58  Bypass Duplex (08/07/17)  Proximal to bypass: 104 c/s  With-in bypass: 27-82 c/s  Distal to bypass: 438 c/s   Medical Decision Making   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 male who presents with:  s/p L iliofem EA w/ DPa, L CFA to BK pop BPG w/ Propaten, Linton patch for L leg rest pain, thrombosis of R SFA stent .   Based on arterial duplex, would recommend: left leg runoff and possible intervention due to elevated PSV distal to bypass, 438 c/s. I discussed with the patient the nature of angiographic procedures, especially the limited patencies of any endovascular intervention.   The patient is aware of that  the risks of an angiographic procedure include but are not limited to: bleeding, infection, access site complications, renal failure, embolization, rupture of vessel, dissection, arteriovenous fistula, possible need for emergent surgical intervention, possible need for surgical procedures to treat the patient's pathology, anaphylactic reaction to contrast, and stroke and death.   The patient is aware of the risks and agrees to proceed.  He is schedule for the 18 JUL 19.  I discussed in depth with the patient the nature of atherosclerosis, and emphasized the importance of maximal medical management including strict control of blood pressure, blood glucose, and lipid levels, obtaining regular exercise, and cessation of smoking.    The patient is aware that without maximal medical management the underlying atherosclerotic disease process will progress, limiting the benefit of any interventions.  I discussed in depth with the patient the need for long term surveillance to improve the primary assisted patency of his bypass.  The patient agrees to cooperate with such.  The patient is currently on a statin: Lipitor.   The patient is currently on an anti-platelet: Plavix.   Pt is also on Eliquis.  Thank you for allowing [place] to participate in this patient's care.   Leonides Sake, MD, FACS [facility] [place] Office: [phone] Pager: [phone]

## 2017-08-14 ENCOUNTER — Encounter: Payer: Self-pay | Admitting: Vascular Surgery

## 2017-08-14 ENCOUNTER — Encounter: Payer: Self-pay | Admitting: *Deleted

## 2017-08-14 ENCOUNTER — Other Ambulatory Visit: Payer: Self-pay | Admitting: *Deleted

## 2017-08-14 ENCOUNTER — Ambulatory Visit (INDEPENDENT_AMBULATORY_CARE_PROVIDER_SITE_OTHER): Payer: Self-pay | Admitting: Vascular Surgery

## 2017-08-14 VITALS — BP 88/58 | HR 88 | Temp 97.0°F | Ht 72.0 in | Wt 175.0 lb

## 2017-08-14 DIAGNOSIS — I70229 Atherosclerosis of native arteries of extremities with rest pain, unspecified extremity: Secondary | ICD-10-CM

## 2017-08-15 ENCOUNTER — Telehealth: Payer: Self-pay

## 2017-08-15 ENCOUNTER — Telehealth: Payer: Self-pay | Admitting: *Deleted

## 2017-08-15 NOTE — Telephone Encounter (Signed)
   [facility] Pre-operative Risk Assessment    Request for surgical clearance:  1. What type of surgery is being performed? angiogram   2. When is this surgery scheduled? 09/05/17   3. What type of clearance is required (medical clearance vs. Pharmacy clearance to hold med vs. Both)? both  4. Are there any medications that need to be held prior to surgery and how long? eliquis not listed   5. Practice name and name of physician performing surgery? [facility] Dr. Adele Barthel   6. What is your office phone number? [phone]   7.   What is your office fax number?  [phone]  8.   Anesthesia type (None, local, MAC, general)?  Not Listed    Willie Morales 08/15/2017, 3:25 PM  _________________________________________________________________   (provider comments below)

## 2017-08-15 NOTE — Telephone Encounter (Signed)
-----   Message from Retta Macebecca J Deneane Stifter, RN sent at 08/15/2017 11:02 AM EDT ----- Regarding: Eliquis hold for procedure Hope I now have this going to the right box!!   Requesting clearance for medication. Patient is scheduled for angiogram possible intervention for 09/05/17 with Dr. Leonides SakeBrian Chen. Thank you for your assistance. Becky RN

## 2017-08-16 NOTE — Telephone Encounter (Signed)
Patient takes Eliquis for afib with CHADS2VASc score of 3 (HTN, DM, CAD/PAD, although BP has been on low end recently). Renal function is normal. Ok to hold Eliquis for 3 days as requested, pt has already been advised of this by Dr Imogene Burnhen.

## 2017-08-16 NOTE — Telephone Encounter (Signed)
Follow up   Patient is returning call that he received about his pre-op clearance. Please call to discuss.

## 2017-08-16 NOTE — Telephone Encounter (Addendum)
   Mr. Willie Morales was seen by Dr. Kirke CorinArida 05/28/2017, he was doing well at that time. Attempted to contact him by phone, left voicemail.  Patient called back and I was able to reach him.  From a cardiac standpoint, he is doing well.  His major frustration is with needing repeated peripheral vascular procedures.  I will route this to the pharmacist to get permission for holding Eliquis for his procedure.  Per the patient, he has received instructions from Dr. Imogene Burnhen to hold it for 3 days.  In his last note, Dr. Kirke CorinArida mentions that perhaps we should consider stopping Plavix because of the Eliquis, but was leaving this up to Dr. Imogene Burnhen.  I will defer holding the Plavix to Dr. Imogene Burnhen.  Willie DemarkRhonda Barrett, PA-C 08/16/2017 10:06 AM Beeper [phone]  I will route this to the requesting physician and removed from the preop pool. [facility]- Dr. Leonides SakeBrian Chen   office phone number? [phone]   office fax number?  [phone]

## 2017-09-05 ENCOUNTER — Encounter (HOSPITAL_COMMUNITY)
Admission: RE | Disposition: A | Discharge status: Home / Self Care | Payer: Self-pay | Source: Ambulatory Visit | Attending: Vascular Surgery

## 2017-09-05 ENCOUNTER — Ambulatory Visit (HOSPITAL_COMMUNITY)
Admission: RE | Admit: 2017-09-05 | Discharge: 2017-09-05 | Disposition: A | Discharge status: Home / Self Care | Payer: BLUE CROSS/BLUE SHIELD | Source: Ambulatory Visit | Attending: Vascular Surgery | Admitting: Vascular Surgery

## 2017-09-05 ENCOUNTER — Telehealth: Payer: Self-pay | Admitting: Vascular Surgery

## 2017-09-05 ENCOUNTER — Other Ambulatory Visit: Payer: Self-pay

## 2017-09-05 DIAGNOSIS — I70222 Atherosclerosis of native arteries of extremities with rest pain, left leg: Secondary | ICD-10-CM | POA: Diagnosis not present

## 2017-09-05 DIAGNOSIS — I70229 Atherosclerosis of native arteries of extremities with rest pain, unspecified extremity: Secondary | ICD-10-CM | POA: Diagnosis present

## 2017-09-05 DIAGNOSIS — I739 Peripheral vascular disease, unspecified: Secondary | ICD-10-CM | POA: Diagnosis present

## 2017-09-05 HISTORY — PX: PERIPHERAL VASCULAR BALLOON ANGIOPLASTY: CATH118281

## 2017-09-05 HISTORY — PX: LOWER EXTREMITY ANGIOGRAPHY: CATH118251

## 2017-09-05 LAB — POCT I-STAT, CHEM 8
BUN: 11 mg/dL (ref 6–20)
Calcium, Ion: 1.24 mmol/L (ref 1.15–1.40)
Chloride: 104 mmol/L (ref 98–111)
Creatinine, Ser: 0.8 mg/dL (ref 0.61–1.24)
Glucose, Bld: 221 mg/dL — ABNORMAL HIGH (ref 70–99)
HCT: 45 % (ref 39.0–52.0)
Hemoglobin: 15.3 g/dL (ref 13.0–17.0)
Potassium: 4.2 mmol/L (ref 3.5–5.1)
Sodium: 139 mmol/L (ref 135–145)
TCO2: 25 mmol/L (ref 22–32)

## 2017-09-05 LAB — GLUCOSE, CAPILLARY
Glucose-Capillary: 236 mg/dL — ABNORMAL HIGH (ref 70–99)
Glucose-Capillary: 127 mg/dL — ABNORMAL HIGH (ref 70–99)

## 2017-09-05 LAB — POCT ACTIVATED CLOTTING TIME
Activated Clotting Time: 202 seconds
Activated Clotting Time: 257 seconds
Activated Clotting Time: 164 seconds

## 2017-09-05 SURGERY — LOWER EXTREMITY ANGIOGRAPHY
Anesthesia: LOCAL

## 2017-09-05 MED ORDER — HYDRALAZINE HCL 20 MG/ML IJ SOLN: 5.0000 mg | INTRAMUSCULAR | Status: DC | PRN

## 2017-09-05 MED ORDER — LABETALOL HCL 5 MG/ML IV SOLN
INTRAVENOUS | Status: AC
  Filled 2017-09-05: qty 4

## 2017-09-05 MED ORDER — IODIXANOL 320 MG/ML IV SOLN
INTRAVENOUS | Status: DC | PRN
  Administered 2017-09-05: 150 mL via INTRA_ARTERIAL

## 2017-09-05 MED ORDER — HEPARIN SODIUM (PORCINE) 1000 UNIT/ML IJ SOLN
INTRAMUSCULAR | Status: AC
  Filled 2017-09-05: qty 1

## 2017-09-05 MED ORDER — ONDANSETRON HCL 4 MG/2ML IJ SOLN: 4.0000 mg | Freq: Four times a day (QID) | INTRAMUSCULAR | Status: DC | PRN

## 2017-09-05 MED ORDER — LABETALOL HCL 5 MG/ML IV SOLN
10.0000 mg | INTRAVENOUS | Status: DC | PRN
  Administered 2017-09-05 (×3): 10 mg via INTRAVENOUS

## 2017-09-05 MED ORDER — HEPARIN (PORCINE) IN NACL 1000-0.9 UT/500ML-% IV SOLN
INTRAVENOUS | Status: AC
  Filled 2017-09-05: qty 1000

## 2017-09-05 MED ORDER — FENTANYL CITRATE (PF) 100 MCG/2ML IJ SOLN
INTRAMUSCULAR | Status: DC | PRN
  Administered 2017-09-05 (×2): 50 ug via INTRAVENOUS

## 2017-09-05 MED ORDER — SODIUM CHLORIDE 0.9 % WEIGHT BASED INFUSION: 1.0000 mL/kg/h | INTRAVENOUS | Status: DC

## 2017-09-05 MED ORDER — SODIUM CHLORIDE 0.9 % IV SOLN: 250.0000 mL | INTRAVENOUS | Status: DC | PRN

## 2017-09-05 MED ORDER — LIDOCAINE HCL (PF) 1 % IJ SOLN
INTRAMUSCULAR | Status: AC
  Filled 2017-09-05: qty 30

## 2017-09-05 MED ORDER — SODIUM CHLORIDE 0.9% FLUSH: 3.0000 mL | Freq: Two times a day (BID) | INTRAVENOUS | Status: DC

## 2017-09-05 MED ORDER — MIDAZOLAM HCL 2 MG/2ML IJ SOLN
INTRAMUSCULAR | Status: AC
  Filled 2017-09-05: qty 2

## 2017-09-05 MED ORDER — MIDAZOLAM HCL 2 MG/2ML IJ SOLN
INTRAMUSCULAR | Status: DC | PRN
  Administered 2017-09-05: 1 mg via INTRAVENOUS

## 2017-09-05 MED ORDER — ACETAMINOPHEN 325 MG PO TABS: 650.0000 mg | ORAL_TABLET | ORAL | Status: DC | PRN

## 2017-09-05 MED ORDER — HEPARIN SODIUM (PORCINE) 1000 UNIT/ML IJ SOLN
INTRAMUSCULAR | Status: DC | PRN
  Administered 2017-09-05: 9000 [IU] via INTRAVENOUS

## 2017-09-05 MED ORDER — SODIUM CHLORIDE 0.9 % IV SOLN
INTRAVENOUS | Status: DC
  Administered 2017-09-05: 09:00:00 via INTRAVENOUS

## 2017-09-05 MED ORDER — HEPARIN (PORCINE) IN NACL 1000-0.9 UT/500ML-% IV SOLN
INTRAVENOUS | Status: DC | PRN
  Administered 2017-09-05 (×2): 500 mL

## 2017-09-05 MED ORDER — SODIUM CHLORIDE 0.9% FLUSH: 3.0000 mL | INTRAVENOUS | Status: DC | PRN

## 2017-09-05 MED ORDER — LIDOCAINE HCL (PF) 1 % IJ SOLN
INTRAMUSCULAR | Status: DC | PRN
  Administered 2017-09-05: 18 mL

## 2017-09-05 MED ORDER — FENTANYL CITRATE (PF) 100 MCG/2ML IJ SOLN
INTRAMUSCULAR | Status: AC
  Filled 2017-09-05: qty 2

## 2017-09-05 SURGICAL SUPPLY — 22 items
BALLN MUSTANG 6X100X135 (BALLOONS) ×3
BALLN STERLING OTW 3X40X150 (BALLOONS)
BALLOON MUSTANG 6X100X135 (BALLOONS) ×2 IMPLANT
BALLOON STERLING OTW 3X40X150 (BALLOONS) IMPLANT
CATH ANGIO 5F BER2 65CM (CATHETERS) ×3 IMPLANT
CATH MUSTANG 3X40X135 (BALLOONS) ×3 IMPLANT
CATH NAVICROSS ANGLED 135CM (MICROCATHETER) ×3 IMPLANT
CATH OMNI FLUSH 5F 65CM (CATHETERS) ×3 IMPLANT
DEVICE TORQUE .025-.038 (MISCELLANEOUS) ×3 IMPLANT
GUIDEWIRE ANGLED .035X260CM (WIRE) ×3 IMPLANT
KIT ENCORE 26 ADVANTAGE (KITS) ×3 IMPLANT
KIT MICROPUNCTURE NIT STIFF (SHEATH) ×3 IMPLANT
KIT PV (KITS) ×3 IMPLANT
SHEATH PINNACLE 5F 10CM (SHEATH) ×3 IMPLANT
SHEATH PINNACLE ST 6F 45CM (SHEATH) ×3 IMPLANT
SHEATH PROBE COVER 6X72 (BAG) ×6 IMPLANT
SYR MEDRAD MARK V 150ML (SYRINGE) ×3 IMPLANT
TRANSDUCER W/STOPCOCK (MISCELLANEOUS) ×3 IMPLANT
TRAY PV CATH (CUSTOM PROCEDURE TRAY) ×3 IMPLANT
WIRE BENTSON .035X145CM (WIRE) ×3 IMPLANT
WIRE G V18X300CM (WIRE) ×3 IMPLANT
WIRE ROSEN-J .035X260CM (WIRE) ×3 IMPLANT

## 2017-09-05 NOTE — Telephone Encounter (Signed)
sch appt lvm 09/25/17 10am ABI 11am f/u MD

## 2017-09-05 NOTE — Progress Notes (Signed)
Site area:Right Femoral artery  Site Prior to Removal: level 0  Pressure Applied For:  20 mins  Minutes Beginning at: 1420  Manual:yes  Patient Status During Pull: without complaints, A/O, resting comfortably  Post Pull Groin Site: level 0  Post Pull Instructions Given:  yes, verbalized and demonstrated understanding  Post Pull Pulses Present:  yes, DP  Dressing Applied:  Gauze, tegaderm  Bedrest started at:  1440

## 2017-09-05 NOTE — Discharge Instructions (Signed)
**Note -identified via Obfuscation** Femoral Site Care °Refer to this sheet in the next few weeks. These instructions provide you with information about caring for yourself after your procedure. Your health care provider may also give you more specific instructions. Your treatment has been planned according to current medical practices, but problems sometimes occur. Call your health care provider if you have any problems or questions after your procedure. °What can I expect after the procedure? °After your procedure, it is typical to have the following: °· Bruising at the site that usually fades within 1-2 weeks. °· Blood collecting in the tissue (hematoma) that may be painful to the touch. It should usually decrease in size and tenderness within 1-2 weeks. ° °Follow these instructions at home: °· Take medicines only as directed by your health care provider. °· You may shower 24-48 hours after the procedure or as directed by your health care provider. Remove the bandage (dressing) and gently wash the site with plain soap and water. Pat the area dry with a clean towel. Do not rub the site, because this may cause bleeding. °· Do not take baths, swim, or use a hot tub until your health care provider approves. °· Check your insertion site every day for redness, swelling, or drainage. °· Do not apply powder or lotion to the site. °· Limit use of stairs to twice a day for the first 2-3 days or as directed by your health care provider. °· Do not squat for the first 2-3 days or as directed by your health care provider. °· Do not lift over 10 lb (4.5 kg) for 5 days after your procedure or as directed by your health care provider. °· Ask your health care provider when it is okay to: °? Return to work or school. °? Resume usual physical activities or sports. °? Resume sexual activity. °· Do not drive home if you are discharged the same day as the procedure. Have someone else drive you. °· You may drive 24 hours after the procedure unless otherwise instructed by  your health care provider. °· Do not operate machinery or power tools for 24 hours after the procedure or as directed by your health care provider. °· If your procedure was done as an outpatient procedure, which means that you went home the same day as your procedure, a responsible adult should be with you for the first 24 hours after you arrive home. °· Keep all follow-up visits as directed by your health care provider. This is important. °Contact a health care provider if: °· You have a fever. °· You have chills. °· You have increased bleeding from the site. Hold pressure on the site. °Get help right away if: °· You have unusual pain at the site. °· You have redness, warmth, or swelling at the site. °· You have drainage (other than a small amount of blood on the dressing) from the site. °· The site is bleeding, and the bleeding does not stop after 30 minutes of holding steady pressure on the site. °· Your leg or foot becomes pale, cool, tingly, or numb. °This information is not intended to replace advice given to you by your health care provider. Make sure you discuss any questions you have with your health care provider. °Document Released: 10/09/2013 Document Revised: 07/14/2015 Document Reviewed: 08/25/2013 °Elsevier Interactive Patient Education © 2018 [employer] ° °

## 2017-09-05 NOTE — Interval H&P Note (Signed)
   History and Physical Update  The patient was interviewed and re-examined.  The patient's previous History and Physical has been reviewed and is unchanged from my consult.  There is no change in the plan of care: Left leg runoff, possible intervention.   I discussed with the patient the nature of angiographic procedures, especially the limited patencies of any endovascular intervention.    The patient is aware of that the risks of an angiographic procedure include but are not limited to: bleeding, infection, access site complications, renal failure, embolization, rupture of vessel, dissection, arteriovenous fistula, possible need for emergent surgical intervention, possible need for surgical procedures to treat the patient's pathology, anaphylactic reaction to contrast, and stroke and death.    The patient is aware of the risks and agrees to proceed.   Leonides SakeBrian Turhan Chill, MD, FACS [facility] [place] Office: [phone] Pager: [phone]  09/05/2017, 7:59 AM

## 2017-09-05 NOTE — Op Note (Addendum)
OPERATIVE NOTE   PROCEDURE: 1.  right common femoral artery cannulation under ultrasound guidance 2.  Placement of catheter in aorta 3.  Bilateral pelvic angiogram  4.  Second order arterial selection 5.  Left leg runoff  6.  Angioplasty of left below-the-knee popliteal artery (3 mm x 40 mm) 7.  Angioplasty of left profunda femoral artery (3 mm x 40 mm) 8.  Angioplasty of left external iliac artery (6 mm x 100 mm) 9.  Conscious sedation for 101 minutes  PRE-OPERATIVE DIAGNOSIS: left leg rest pain, high grade stenosis distal to bypass  POST-OPERATIVE DIAGNOSIS: same as above   SURGEON: Adele Barthel, MD  ANESTHESIA: conscious sedation  ESTIMATED BLOOD LOSS: 50 cc  CONTRAST: 150 cc  FINDING(S): 1.  Serial stenoses in left external iliac artery: 50%, 75-90% just proximal to prior endarterectomy and patch angioplasty (30-50% residual after serial angioplasty) 2.  Patent left femoropopliteal bypass 3.  Left profunda femoral artery stenosis: 50-75% (no change with angioplasty) 4.  Left below-the-knee popliteal artery distal to anastomosis has sub-total occlusion: resolved to 3 mm lumen 5.  Intact left anterior tibial artery, posterior tibial artery, and peroneal artery  6.  No evidence of distal embolization  SPECIMEN(S):  none  INDICATIONS:   Willie Morales is a 57 y.o. male who presents with prior left leg rest pain requiring surgical revascularization.  On follow-up surveillance, high grade stenosis in outflow artery was discovered.  The patient presents for: left leg runoff and possible intervention.  I discussed with the patient the nature of angiographic procedures, especially the limited patencies of any endovascular intervention.  The patient is aware of that the risks of an angiographic procedure include but are not limited to: bleeding, infection, access site complications, renal failure, embolization, rupture of vessel, dissection, possible need for emergent surgical  intervention, possible need for surgical procedures to treat the patient's pathology, and stroke and death.  The patient is aware of the risks and agrees to proceed.  DESCRIPTION: After full informed consent was obtained from the patient, the patient was brought back to the angiography suite.  The patient was placed supine upon the angiography table and connected to cardiopulmonary monitoring equipment.  The patient was then given conscious sedation, the amounts of which are documented in the patient's chart.  A circulating radiologic technician maintained continuous monitoring of the patient's cardiopulmonary status.  Additionally, the control room radiologic technician provided backup monitoring throughout the procedure.  The patient was prepped and drape in the standard fashion for an angiographic procedure.  At this point, attention was turned to the right groin.  Under ultrasound guidance, the subcutaneous tissue surrounding the right common femoral artery was anesthesized with 1% lidocaine with epinephrine.  Under Sonosite guidance, the patency of the artery was noted to be: patent with minimal anterior wall calcification.  Ultrasound image was permanently recorded.  The artery was then cannulated with a micropuncture needle.  The microwire was advanced into the iliac arterial system.  The needle was exchanged for a microsheath, which was loaded into the common femoral artery over the wire.  The microwire was exchanged for a Bentson wire which was advanced into the aorta.  The microsheath was then exchanged for a 5-Fr sheath which was loaded into the common femoral artery.  The Omniflush catheter was then loaded over the wire up to the level of aortic bifurcation.  The catheter was connected to the power injector circuit.  After de-airring and de-clotting the circuit, a bilateral  pelvic angiogram was completed.  This demonstrated the serial stenoses in the left iliac artery segment as documented  above.  Using a Bentson wire and Omniflush catheter, the left common iliac artery was selected.  The catheter and wire were advanced into the external iliac artery.  The wire was removed and then catheter was connected to the power injector circuit.  An automated left leg runoff was completed.  The findings are listed above.  Based on the images, I felt intervention on the below-the-knee popliteal artery, profunda femoral artery, and distal external iliac artery was needed.  The wire was exchanged for a Rosen wire.  The patient's right femoral sheath was exchanged for a 6-Fr Destination sheath, which was lodged in the right common femoral artery.  The dilator was removed.  The patient was given 9000 units of Heparin intravenously, which was a therapeutic bolus.    A Navicross catheter was loaded over the wire and then the wire was exchanged for a Glidewire.  Using these two, I selected the femoropopliteal bypass and then advanced into the distal bypass.  I did a hand injection to image the distal anastomosis.  Using this combination, I was able to eventually get into the tibioperoneal trunk with the wire.  The tibial arteries measured to be roughly 3 mm.  A 3 mm x 40 mm angioplasty balloon was selected and centered on the distal below-the-knee popliteal artery and anastomosis.  The balloon was inflated to 10 atm for 3 minutes and then deflated to 4 atm for 2 minutes.  I removed the balloon.  Completion demonstrated greatly improved lumen, 3 mm with intact tibial flow.  I then pulled the wire into the common femoral artery.  With some difficulty, I eventually was able to select the left profunda femoral artery using a Bentson wire and BER-2 catheter.  I exchanged the wire for a Rosen wire.  The 3 mm x 40 mm balloon was selected based on measurements.  I centered the balloon on the proximal profunda femoral artery stenosis.  The balloon was inflated to 8 atm for 2 minutes.  I deflated the balloon and removed  it.  Completion demonstrated no change in the profunda femoral artery lumen.  From intraoperative findings, this profunda femoral artery is heavily calcified, so I felt attempting further angioplasty was likely to result in a dissection.  I then pulled the sheath in to the left common iliac artery.  Hand injection verified the position of the external iliac artery stenoses.  I elected to try to angioplasty the left external iliac artery first as this patient has previously thrombosed his left superficial femoral artery stents spontaneously.  I selected a 6 mm x 100 mm balloon and centered it on the left external iliac artery stenoses.  The balloon was inflated to 10 atm for 2 minutes.  The balloon was deflated and removed.  Completion injection demonstrated: reduction in 75-90% stenosis to 30-50% stenosis (5 mm residual lumen).  I pulled out the wire at this time.  I then did hand injection to image the femoropopliteal graft and runoff. The flow was greatly augmented from previous with improved tibial artery flow.  There no evidence of distal embolism.  The sheath was pulled back into the right external iliac artery.  The sheath was aspirated.  No clots were present and the sheath was reloaded with heparinized saline.  The sheath will be removed in holding once anticoagulation reverses.   COMPLICATIONS: none  CONDITION: stable   Willie Morales  Bridgett Larsson, MD, FACS [facility] [place] Office: [phone] Pager: [phone]  09/05/2017, 1:35 PM

## 2017-09-06 ENCOUNTER — Encounter (HOSPITAL_COMMUNITY): Payer: Self-pay | Admitting: Vascular Surgery

## 2017-09-09 ENCOUNTER — Other Ambulatory Visit: Payer: Self-pay

## 2017-09-09 DIAGNOSIS — I779 Disorder of arteries and arterioles, unspecified: Secondary | ICD-10-CM

## 2017-09-09 DIAGNOSIS — I70229 Atherosclerosis of native arteries of extremities with rest pain, unspecified extremity: Secondary | ICD-10-CM

## 2017-09-09 DIAGNOSIS — Z48812 Encounter for surgical aftercare following surgery on the circulatory system: Secondary | ICD-10-CM

## 2017-09-17 ENCOUNTER — Emergency Department (HOSPITAL_COMMUNITY): Payer: BLUE CROSS/BLUE SHIELD | Admitting: Anesthesiology

## 2017-09-17 ENCOUNTER — Encounter (HOSPITAL_COMMUNITY): Payer: Self-pay | Admitting: Emergency Medicine

## 2017-09-17 ENCOUNTER — Inpatient Hospital Stay (HOSPITAL_COMMUNITY)
Admission: EM | Admit: 2017-09-17 | Discharge: 2017-09-18 | DRG: 254 | Disposition: A | Discharge status: Home / Self Care | Payer: BLUE CROSS/BLUE SHIELD | Attending: Vascular Surgery | Admitting: Vascular Surgery

## 2017-09-17 ENCOUNTER — Other Ambulatory Visit: Payer: Self-pay

## 2017-09-17 ENCOUNTER — Inpatient Hospital Stay (HOSPITAL_COMMUNITY)
Admission: EM | Disposition: A | Discharge status: Home / Self Care | Payer: Self-pay | Source: Home / Self Care | Attending: Vascular Surgery

## 2017-09-17 DIAGNOSIS — I480 Paroxysmal atrial fibrillation: Secondary | ICD-10-CM | POA: Diagnosis present

## 2017-09-17 DIAGNOSIS — Z79890 Hormone replacement therapy (postmenopausal): Secondary | ICD-10-CM

## 2017-09-17 DIAGNOSIS — Z7902 Long term (current) use of antithrombotics/antiplatelets: Secondary | ICD-10-CM | POA: Diagnosis not present

## 2017-09-17 DIAGNOSIS — I10 Essential (primary) hypertension: Secondary | ICD-10-CM | POA: Diagnosis present

## 2017-09-17 DIAGNOSIS — E785 Hyperlipidemia, unspecified: Secondary | ICD-10-CM | POA: Diagnosis present

## 2017-09-17 DIAGNOSIS — Z7901 Long term (current) use of anticoagulants: Secondary | ICD-10-CM

## 2017-09-17 DIAGNOSIS — Z881 Allergy status to other antibiotic agents status: Secondary | ICD-10-CM

## 2017-09-17 DIAGNOSIS — I70209 Unspecified atherosclerosis of native arteries of extremities, unspecified extremity: Secondary | ICD-10-CM | POA: Diagnosis present

## 2017-09-17 DIAGNOSIS — I2510 Atherosclerotic heart disease of native coronary artery without angina pectoris: Secondary | ICD-10-CM | POA: Diagnosis present

## 2017-09-17 DIAGNOSIS — F17210 Nicotine dependence, cigarettes, uncomplicated: Secondary | ICD-10-CM | POA: Diagnosis present

## 2017-09-17 DIAGNOSIS — I998 Other disorder of circulatory system: Secondary | ICD-10-CM | POA: Diagnosis not present

## 2017-09-17 DIAGNOSIS — I4891 Unspecified atrial fibrillation: Secondary | ICD-10-CM

## 2017-09-17 DIAGNOSIS — Z7984 Long term (current) use of oral hypoglycemic drugs: Secondary | ICD-10-CM

## 2017-09-17 DIAGNOSIS — I739 Peripheral vascular disease, unspecified: Secondary | ICD-10-CM | POA: Diagnosis present

## 2017-09-17 DIAGNOSIS — L8990 Pressure ulcer of unspecified site, unspecified stage: Secondary | ICD-10-CM

## 2017-09-17 DIAGNOSIS — E119 Type 2 diabetes mellitus without complications: Secondary | ICD-10-CM | POA: Diagnosis present

## 2017-09-17 DIAGNOSIS — E1151 Type 2 diabetes mellitus with diabetic peripheral angiopathy without gangrene: Secondary | ICD-10-CM | POA: Diagnosis present

## 2017-09-17 DIAGNOSIS — T82868A Thrombosis of vascular prosthetic devices, implants and grafts, initial encounter: Principal | ICD-10-CM | POA: Diagnosis present

## 2017-09-17 HISTORY — PX: ENDARTERECTOMY POPLITEAL: SHX5806

## 2017-09-17 HISTORY — PX: FEMORAL-POPLITEAL BYPASS GRAFT: SHX937

## 2017-09-17 HISTORY — PX: PATCH ANGIOPLASTY: SHX6230

## 2017-09-17 LAB — CBC
HCT: 47.2 % (ref 39.0–52.0)
Hemoglobin: 15.2 g/dL (ref 13.0–17.0)
MCH: 29.8 pg (ref 26.0–34.0)
MCHC: 32.2 g/dL (ref 30.0–36.0)
MCV: 92.5 fL (ref 78.0–100.0)
Platelets: 167 10*3/uL (ref 150–400)
RBC: 5.1 MIL/uL (ref 4.22–5.81)
RDW: 13.9 % (ref 11.5–15.5)
WBC: 17.9 10*3/uL — ABNORMAL HIGH (ref 4.0–10.5)

## 2017-09-17 LAB — BASIC METABOLIC PANEL
Anion gap: 12 (ref 5–15)
BUN: 20 mg/dL (ref 6–20)
CO2: 21 mmol/L — ABNORMAL LOW (ref 22–32)
Calcium: 10 mg/dL (ref 8.9–10.3)
Chloride: 105 mmol/L (ref 98–111)
Creatinine, Ser: 1.24 mg/dL (ref 0.61–1.24)
GFR calc Af Amer: >60 mL/min (ref >60)
GFR calc non Af Amer: >60 mL/min (ref >60)
Glucose, Bld: 239 mg/dL — ABNORMAL HIGH (ref 70–99)
Potassium: 4.2 mmol/L (ref 3.5–5.1)
Sodium: 138 mmol/L (ref 135–145)

## 2017-09-17 LAB — PROTIME-INR
INR: 1.13
Prothrombin Time: 14.4 s (ref 11.4–15.2)

## 2017-09-17 SURGERY — BYPASS GRAFT FEMORAL-POPLITEAL ARTERY
Anesthesia: General | Laterality: Left

## 2017-09-17 MED ORDER — DILTIAZEM HCL-DEXTROSE 100-5 MG/100ML-% IV SOLN (PREMIX)
5.0000 mg/h | INTRAVENOUS | Status: DC
  Administered 2017-09-17: 5 mg/h via INTRAVENOUS
  Filled 2017-09-17: qty 100

## 2017-09-17 MED ORDER — PROPOFOL 10 MG/ML IV BOLUS
INTRAVENOUS | Status: DC | PRN
  Administered 2017-09-17: 170 mg via INTRAVENOUS
  Administered 2017-09-17: 30 mg via INTRAVENOUS

## 2017-09-17 MED ORDER — ROCURONIUM BROMIDE 100 MG/10ML IV SOLN
INTRAVENOUS | Status: DC | PRN
  Administered 2017-09-17: 20 mg via INTRAVENOUS
  Administered 2017-09-17: 30 mg via INTRAVENOUS

## 2017-09-17 MED ORDER — 0.9 % SODIUM CHLORIDE (POUR BTL) OPTIME
TOPICAL | Status: DC | PRN
  Administered 2017-09-17: 2000 mL

## 2017-09-17 MED ORDER — FENTANYL CITRATE (PF) 250 MCG/5ML IJ SOLN
INTRAMUSCULAR | Status: DC | PRN
  Administered 2017-09-17 (×5): 50 ug via INTRAVENOUS

## 2017-09-17 MED ORDER — HEPARIN BOLUS VIA INFUSION
2000.0000 [IU] | Freq: Once | INTRAVENOUS | Status: DC
  Filled 2017-09-17: qty 2000

## 2017-09-17 MED ORDER — MIDAZOLAM HCL 2 MG/2ML IJ SOLN
INTRAMUSCULAR | Status: AC
  Filled 2017-09-17: qty 2

## 2017-09-17 MED ORDER — ARTIFICIAL TEARS OPHTHALMIC OINT
TOPICAL_OINTMENT | OPHTHALMIC | Status: AC
  Filled 2017-09-17: qty 3.5

## 2017-09-17 MED ORDER — DILTIAZEM LOAD VIA INFUSION
15.0000 mg | Freq: Once | INTRAVENOUS | Status: AC
  Administered 2017-09-17: 15 mg via INTRAVENOUS
  Filled 2017-09-17: qty 15

## 2017-09-17 MED ORDER — MORPHINE SULFATE (PF) 4 MG/ML IV SOLN
INTRAVENOUS | Status: AC
  Filled 2017-09-17: qty 1

## 2017-09-17 MED ORDER — SODIUM CHLORIDE 0.9 % IV SOLN
INTRAVENOUS | Status: DC | PRN
  Administered 2017-09-17: 21:00:00 via INTRAVENOUS

## 2017-09-17 MED ORDER — ROCURONIUM BROMIDE 10 MG/ML (PF) SYRINGE
PREFILLED_SYRINGE | INTRAVENOUS | Status: AC
  Filled 2017-09-17: qty 10

## 2017-09-17 MED ORDER — HEPARIN SODIUM (PORCINE) 1000 UNIT/ML IJ SOLN
INTRAMUSCULAR | Status: AC
  Filled 2017-09-17: qty 1

## 2017-09-17 MED ORDER — SUGAMMADEX SODIUM 200 MG/2ML IV SOLN
INTRAVENOUS | Status: DC | PRN
  Administered 2017-09-17: 200 mg via INTRAVENOUS

## 2017-09-17 MED ORDER — HEPARIN (PORCINE) IN NACL 100-0.45 UNIT/ML-% IJ SOLN
1400.0000 [IU]/h | INTRAMUSCULAR | Status: DC
  Administered 2017-09-17: 1400 [IU]/h via INTRAVENOUS
  Filled 2017-09-17: qty 250

## 2017-09-17 MED ORDER — PHENYLEPHRINE 40 MCG/ML (10ML) SYRINGE FOR IV PUSH (FOR BLOOD PRESSURE SUPPORT)
PREFILLED_SYRINGE | INTRAVENOUS | Status: AC
  Filled 2017-09-17: qty 10

## 2017-09-17 MED ORDER — SUCCINYLCHOLINE CHLORIDE 200 MG/10ML IV SOSY
PREFILLED_SYRINGE | INTRAVENOUS | Status: AC
  Filled 2017-09-17: qty 10

## 2017-09-17 MED ORDER — THROMBIN 20000 UNITS EX SOLR
CUTANEOUS | Status: DC | PRN
  Administered 2017-09-17: via TOPICAL

## 2017-09-17 MED ORDER — FENTANYL CITRATE (PF) 250 MCG/5ML IJ SOLN
INTRAMUSCULAR | Status: AC
  Filled 2017-09-17: qty 5

## 2017-09-17 MED ORDER — ONDANSETRON HCL 4 MG/2ML IJ SOLN
INTRAMUSCULAR | Status: AC
  Filled 2017-09-17: qty 2

## 2017-09-17 MED ORDER — SUGAMMADEX SODIUM 200 MG/2ML IV SOLN
INTRAVENOUS | Status: AC
  Filled 2017-09-17: qty 2

## 2017-09-17 MED ORDER — LIDOCAINE 2% (20 MG/ML) 5 ML SYRINGE
INTRAMUSCULAR | Status: AC
  Filled 2017-09-17: qty 5

## 2017-09-17 MED ORDER — DEXAMETHASONE SODIUM PHOSPHATE 10 MG/ML IJ SOLN
INTRAMUSCULAR | Status: AC
  Filled 2017-09-17: qty 1

## 2017-09-17 MED ORDER — PROPOFOL 10 MG/ML IV BOLUS
INTRAVENOUS | Status: AC
  Filled 2017-09-17: qty 20

## 2017-09-17 MED ORDER — HEPARIN SODIUM (PORCINE) 1000 UNIT/ML IJ SOLN
INTRAMUSCULAR | Status: DC | PRN
  Administered 2017-09-17: 7000 [IU] via INTRAVENOUS

## 2017-09-17 MED ORDER — LACTATED RINGERS IV SOLN
INTRAVENOUS | Status: DC | PRN
  Administered 2017-09-17 (×4): via INTRAVENOUS

## 2017-09-17 MED ORDER — CEFAZOLIN SODIUM-DEXTROSE 2-4 GM/100ML-% IV SOLN
INTRAVENOUS | Status: AC
  Administered 2017-09-18: 2000 mg
  Filled 2017-09-17: qty 100

## 2017-09-17 MED ORDER — PROTAMINE SULFATE 10 MG/ML IV SOLN
INTRAVENOUS | Status: DC | PRN
  Administered 2017-09-17: 20 mg via INTRAVENOUS

## 2017-09-17 MED ORDER — SODIUM CHLORIDE 0.9 % IJ SOLN
INTRAMUSCULAR | Status: AC
  Filled 2017-09-17: qty 10

## 2017-09-17 MED ORDER — MIDAZOLAM HCL 5 MG/5ML IJ SOLN
INTRAMUSCULAR | Status: DC | PRN
  Administered 2017-09-17 (×2): 1 mg via INTRAVENOUS

## 2017-09-17 MED ORDER — SODIUM CHLORIDE 0.9 % IV SOLN
INTRAVENOUS | Status: AC
  Filled 2017-09-17: qty 1.2

## 2017-09-17 MED ORDER — MORPHINE SULFATE (PF) 4 MG/ML IV SOLN
6.0000 mg | Freq: Once | INTRAVENOUS | Status: AC
  Administered 2017-09-17: 6 mg via INTRAVENOUS

## 2017-09-17 MED ORDER — SUCCINYLCHOLINE CHLORIDE 20 MG/ML IJ SOLN
INTRAMUSCULAR | Status: DC | PRN
  Administered 2017-09-17: 100 mg via INTRAVENOUS

## 2017-09-17 MED ORDER — ONDANSETRON HCL 4 MG/2ML IJ SOLN
INTRAMUSCULAR | Status: DC | PRN
  Administered 2017-09-17: 4 mg via INTRAVENOUS

## 2017-09-17 MED ORDER — CEFAZOLIN SODIUM-DEXTROSE 2-3 GM-%(50ML) IV SOLR
INTRAVENOUS | Status: DC | PRN
  Administered 2017-09-17: 2 g via INTRAVENOUS

## 2017-09-17 MED ORDER — LIDOCAINE HCL (CARDIAC) PF 100 MG/5ML IV SOSY
PREFILLED_SYRINGE | INTRAVENOUS | Status: DC | PRN
  Administered 2017-09-17: 60 mg via INTRATRACHEAL

## 2017-09-17 MED ORDER — THROMBIN 5000 UNITS EX SOLR
CUTANEOUS | Status: AC
  Filled 2017-09-17: qty 5000

## 2017-09-17 MED ORDER — SODIUM CHLORIDE 0.9 % IV SOLN
INTRAVENOUS | Status: DC | PRN
  Administered 2017-09-17: 40 ug/min via INTRAVENOUS

## 2017-09-17 MED ORDER — SODIUM CHLORIDE 0.9 % IV SOLN
INTRAVENOUS | Status: DC | PRN
  Administered 2017-09-17: 500 mL

## 2017-09-17 MED ORDER — PROTAMINE SULFATE 10 MG/ML IV SOLN
INTRAVENOUS | Status: AC
  Filled 2017-09-17: qty 5

## 2017-09-17 SURGICAL SUPPLY — 68 items
BANDAGE ACE 4X5 VEL STRL LF (GAUZE/BANDAGES/DRESSINGS) ×3 IMPLANT
BANDAGE ESMARK 6X9 LF (GAUZE/BANDAGES/DRESSINGS) ×2 IMPLANT
BNDG ESMARK 6X9 LF (GAUZE/BANDAGES/DRESSINGS) ×3
CANISTER SUCT 3000ML PPV (MISCELLANEOUS) ×3 IMPLANT
CANNULA VESSEL 3MM 2 BLNT TIP (CANNULA) ×6 IMPLANT
CATH EMB 3FR 80CM (CATHETERS) ×3 IMPLANT
CATH EMB 4FR 80CM (CATHETERS) ×6 IMPLANT
CLIP VESOCCLUDE MED 24/CT (CLIP) ×3 IMPLANT
CLIP VESOCCLUDE SM WIDE 24/CT (CLIP) ×3 IMPLANT
COVER PROBE W GEL 5X96 (DRAPES) ×3 IMPLANT
CUFF TOURNIQUET SINGLE 24IN (TOURNIQUET CUFF) ×3 IMPLANT
CUFF TOURNIQUET SINGLE 34IN LL (TOURNIQUET CUFF) IMPLANT
CUFF TOURNIQUET SINGLE 44IN (TOURNIQUET CUFF) IMPLANT
DERMABOND ADVANCED (GAUZE/BANDAGES/DRESSINGS) ×1
DERMABOND ADVANCED .7 DNX12 (GAUZE/BANDAGES/DRESSINGS) ×2 IMPLANT
DRAIN CHANNEL 15F RND FF W/TCR (WOUND CARE) ×3 IMPLANT
DRAPE HALF SHEET 40X57 (DRAPES) ×3 IMPLANT
DRAPE X-RAY CASS 24X20 (DRAPES) IMPLANT
DRSG COVADERM 4X8 (GAUZE/BANDAGES/DRESSINGS) IMPLANT
ELECT REM PT RETURN 9FT ADLT (ELECTROSURGICAL) ×3
ELECTRODE REM PT RTRN 9FT ADLT (ELECTROSURGICAL) ×2 IMPLANT
EVACUATOR SILICONE 100CC (DRAIN) ×3 IMPLANT
GAUZE SPONGE 2X2 8PLY STRL LF (GAUZE/BANDAGES/DRESSINGS) ×2 IMPLANT
GAUZE SPONGE 4X4 12PLY STRL LF (GAUZE/BANDAGES/DRESSINGS) IMPLANT
GAUZE SPONGE 4X4 16PLY XRAY LF (GAUZE/BANDAGES/DRESSINGS) ×3 IMPLANT
GLOVE BIO SURGEON STRL SZ7.5 (GLOVE) ×3 IMPLANT
GLOVE BIOGEL PI IND STRL 7.0 (GLOVE) ×6 IMPLANT
GLOVE BIOGEL PI IND STRL 7.5 (GLOVE) ×2 IMPLANT
GLOVE BIOGEL PI IND STRL 8 (GLOVE) ×2 IMPLANT
GLOVE BIOGEL PI INDICATOR 7.0 (GLOVE) ×3
GLOVE BIOGEL PI INDICATOR 7.5 (GLOVE) ×1
GLOVE BIOGEL PI INDICATOR 8 (GLOVE) ×1
GLOVE ECLIPSE 7.0 STRL STRAW (GLOVE) ×3 IMPLANT
GLOVE SURG SS PI 6.5 STRL IVOR (GLOVE) ×3 IMPLANT
GOWN STRL REUS W/ TWL LRG LVL3 (GOWN DISPOSABLE) ×6 IMPLANT
GOWN STRL REUS W/TWL LRG LVL3 (GOWN DISPOSABLE) ×3
KIT BASIN OR (CUSTOM PROCEDURE TRAY) ×3 IMPLANT
KIT TURNOVER KIT B (KITS) ×3 IMPLANT
MARKER GRAFT CORONARY BYPASS (MISCELLANEOUS) IMPLANT
NS IRRIG 1000ML POUR BTL (IV SOLUTION) ×6 IMPLANT
PACK PERIPHERAL VASCULAR (CUSTOM PROCEDURE TRAY) ×3 IMPLANT
PAD ARMBOARD 7.5X6 YLW CONV (MISCELLANEOUS) ×6 IMPLANT
PATCH VASC XENOSURE 1CMX6CM (Vascular Products) ×1 IMPLANT
PATCH VASC XENOSURE 1X6 (Vascular Products) ×2 IMPLANT
SET COLLECT BLD 21X3/4 12 (NEEDLE) IMPLANT
SLEEVE SURGEON STRL (DRAPES) ×3 IMPLANT
SPONGE GAUZE 2X2 STER 10/PKG (GAUZE/BANDAGES/DRESSINGS) ×1
SPONGE SURGIFOAM ABS GEL 100 (HEMOSTASIS) ×3 IMPLANT
STOPCOCK 4 WAY LG BORE MALE ST (IV SETS) IMPLANT
SUT ETHILON 3 0 PS 1 (SUTURE) ×3 IMPLANT
SUT PROLENE 5 0 C 1 24 (SUTURE) ×3 IMPLANT
SUT PROLENE 6 0 BV (SUTURE) ×15 IMPLANT
SUT SILK 2 0 PERMA HAND 18 BK (SUTURE) IMPLANT
SUT SILK 2 0 SH (SUTURE) IMPLANT
SUT SILK 3 0 (SUTURE)
SUT SILK 3-0 18XBRD TIE 12 (SUTURE) IMPLANT
SUT VIC AB 2-0 CTB1 (SUTURE) ×3 IMPLANT
SUT VIC AB 3-0 SH 27 (SUTURE) ×1
SUT VIC AB 3-0 SH 27X BRD (SUTURE) ×2 IMPLANT
SUT VICRYL 4-0 PS2 18IN ABS (SUTURE) ×3 IMPLANT
SYR 3ML LL SCALE MARK (SYRINGE) ×6 IMPLANT
SYR TB 1ML LUER SLIP (SYRINGE) ×3 IMPLANT
TAPE CLOTH SURG 4X10 WHT LF (GAUZE/BANDAGES/DRESSINGS) ×3 IMPLANT
TOWEL GREEN STERILE (TOWEL DISPOSABLE) ×6 IMPLANT
TRAY FOLEY MTR SLVR 16FR STAT (SET/KITS/TRAYS/PACK) ×3 IMPLANT
TUBING EXTENTION W/L.L. (IV SETS) IMPLANT
UNDERPAD 30X30 (UNDERPADS AND DIAPERS) ×3 IMPLANT
WATER STERILE IRR 1000ML POUR (IV SOLUTION) ×3 IMPLANT

## 2017-09-17 NOTE — ED Provider Notes (Addendum)
[facility] EMERGENCY DEPARTMENT Provider Note   CSN: [id number] Arrival date & time: 09/17/17  1928     History   Chief Complaint Chief Complaint  Patient presents with  . Leg Pain    HPI Willie Morales is a 57 y.o. male.  HPI   57 yo male who underwent recent left leg vascular procedure by Dr Imogene Burn (see below)  PROCEDURE: 1.  right common femoral artery cannulation under ultrasound guidance 2.  Placement of catheter in aorta 3.  Bilateral pelvic angiogram  4.  Second order arterial selection 5.  Left leg runoff  6.  Angioplasty of left below-the-knee popliteal artery (3 mm x 40 mm) 7.  Angioplasty of left profunda femoral artery (3 mm x 40 mm) 8.  Angioplasty of left external iliac artery (6 mm x 100 mm) 9.  Conscious sedation for 101 minutes  Presents to the ER today with 2 hours of acute left leg pain and weakness of the left leg. Had been sitting with his legs crossed on the ground. Stood up and noticed the significant pain and weakness in the left lower extremity. Pain is severe in severity at this time. Hx of afib. Noncompliant with his eliquis. Last dose was yesterday morning. No fevers. No other complaints   Past Medical History:  Diagnosis Date  . Coronary artery disease   . Diabetes mellitus   . Dyslipidemia   . History of kidney stones   . Hypertension   . Paroxysmal atrial fibrillation (HCC) 12/2010  . Tobacco abuse     Patient Active Problem List   Diagnosis Date Noted  . PAD (peripheral artery disease) (HCC) 05/09/2017  . Atherosclerosis of artery of extremity with rest pain (HCC)   . Preoperative cardiovascular examination   . Leukocytosis 12/10/2016  . Paroxysmal atrial fibrillation (HCC)   . Coronary artery disease     Past Surgical History:  Procedure Laterality Date  . ABDOMINAL AORTOGRAM W/LOWER EXTREMITY N/A 07/05/2016   Procedure: Abdominal Aortogram w/Lower Extremity;  Surgeon: Fransisco Hertz, MD;  Location: [facility]  INVASIVE CV LAB;  Service: Cardiovascular;  Laterality: N/A;  . ABDOMINAL AORTOGRAM W/LOWER EXTREMITY N/A 05/06/2017   Procedure: ABDOMINAL AORTOGRAM W/LOWER EXTREMITY;  Surgeon: Maeola Harman, MD;  Location: [facility] INVASIVE CV LAB;  Service: Cardiovascular;  Laterality: N/A;  . BACK SURGERY  1991  . CARDIAC CATHETERIZATION  07/31/2007   This showed nonobstructive coronary artery disease   He had 30% lesion in the mid RCA, 30% lesion in the PDA, 45% lesion in the LAD and mid  LAD, and 30% lesion in the diagonal.  The left circumflex was normal.  The LV function was 60%.     Marland Kitchen ENDARTERECTOMY FEMORAL Left 05/09/2017   Procedure: ENDARTERECTOMY FEMORAL WITH DACRON PATCH ANGIOPLASTY;  Surgeon: Fransisco Hertz, MD;  Location: [facility] OR;  Service: Vascular;  Laterality: Left;  . FEMORAL-POPLITEAL BYPASS GRAFT Left 05/09/2017   Procedure: BYPASS GRAFT FEMORAL-POPLITEAL ARTERY LEFT;  Surgeon: Fransisco Hertz, MD;  Location: [facility] OR;  Service: Vascular;  Laterality: Left;  . KIDNEY STONE SURGERY  2011  . LOWER EXTREMITY ANGIOGRAPHY N/A 12/06/2016   Procedure: Lower Extremity Angiography;  Surgeon: Fransisco Hertz, MD;  Location: [facility] INVASIVE CV LAB;  Service: Cardiovascular;  Laterality: N/A;  . LOWER EXTREMITY ANGIOGRAPHY N/A 09/05/2017   Procedure: LOWER EXTREMITY ANGIOGRAPHY;  Surgeon: Fransisco Hertz, MD;  Location: [facility] INVASIVE CV LAB;  Service: Cardiovascular;  Laterality: N/A;  . PATCH ANGIOPLASTY Left  05/09/2017   Procedure: PATCH ANGIOPLASTY WITH PROPATEN GRAFT  OF BELOW KNEE POPLITEAL ARTERY;  Surgeon: Fransisco Hertzhen, Brian L, MD;  [facility];  Service: Vascular;  Laterality: Left;  . PERIPHERAL VASCULAR ATHERECTOMY Right 07/19/2016   Procedure: Peripheral Vascular Atherectomy;  Surgeon: Fransisco Hertzhen, Brian L, MD;  [facility];  Service: Cardiovascular;  Laterality: Right;  . PERIPHERAL VASCULAR BALLOON ANGIOPLASTY Left 07/05/2016   Procedure: Peripheral Vascular Balloon Angioplasty;  Surgeon: Fransisco Hertzhen, Brian  L, MD;  [facility];  Service: Cardiovascular;  Laterality: Left;  SFA  . PERIPHERAL VASCULAR BALLOON ANGIOPLASTY Left 09/05/2017   Procedure: PERIPHERAL VASCULAR BALLOON ANGIOPLASTY;  Surgeon: Fransisco Hertzhen, Brian L, MD;  [facility];  Service: Cardiovascular;  Laterality: Left;  Popliteal, profunda femoral, external iliac  . PERIPHERAL VASCULAR INTERVENTION  12/06/2016   Procedure: PERIPHERAL VASCULAR INTERVENTION;  Surgeon: Fransisco Hertzhen, Brian L, MD;  [facility];  Service: Cardiovascular;;  LEFT        Home Medications    Prior to Admission medications   Medication Sig Start Date End Date Taking? Authorizing Provider  apixaban (ELIQUIS) 5 MG TABS tablet Take 1 tablet (5 mg total) by mouth 2 (two) times daily. 03/26/16   Jodelle GrossLawrence, Kathryn M, NP  atorvastatin (LIPITOR) 10 MG tablet Take 10 mg by mouth daily.  11/12/16   [provider]  benazepril (LOTENSIN) 10 MG tablet Take 1 tablet (10 mg total) by mouth daily. 04/03/16   Jodelle GrossLawrence, Kathryn M, NP  clopidogrel (PLAVIX) 75 MG tablet Take 1 tablet (75 mg total) by mouth daily. 07/05/16   Fransisco Hertzhen, Brian L, MD  gabapentin (NEURONTIN) 300 MG capsule Take 1 capsule (300 mg total) by mouth 3 (three) times daily. 08/17/16   Fransisco Hertzhen, Brian L, MD  glimepiride (AMARYL) 4 MG tablet Take 1 tablet (4 mg total) by mouth daily with breakfast. 03/19/16   Jodelle GrossLawrence, Kathryn M, NP  HYDROcodone-acetaminophen (NORCO/VICODIN) 5-325 MG tablet Take 1 tablet by mouth every 6 (six) hours as needed for moderate pain. 05/29/17   Lars Mageollins, Emma M, PA-C  levothyroxine (SYNTHROID, LEVOTHROID) 50 MCG tablet Take 25 mcg by mouth daily before breakfast.     [provider]  metoprolol tartrate (LOPRESSOR) 25 MG tablet TAKE ONE TABLET BY MOUTH TWICE DAILY 06/12/17   Iran OuchArida, Muhammad A, MD  sitaGLIPtin-metformin (JANUMET) 50-1000 MG tablet Take 1 tablet by mouth 2 (two) times daily.    [provider]    Family History Family  History  Problem Relation Age of Onset  . Coronary artery disease Father        CABG in early 7250's  . Heart attack Father   . Heart disease Father   . Cancer Mother   . Heart disease Mother   . Heart disease Brother   . Diabetes Maternal Grandmother   . SIDS Sister     Social History Social History   Tobacco Use  . Smoking status: Current Every Day Smoker    Packs/day: 1.00    Years: 35.00    Pack years: 35.00    Types: Cigarettes    Last attempt to quit: 12/18/2015    Years since quitting: 1.7  . Smokeless tobacco: Never Used  . Tobacco comment: 3/4 pack per day  Substance Use Topics  . Alcohol use: No  . Drug use: No     Allergies   Ciprofloxacin   Review of Systems Review of Systems  All other systems reviewed and are  negative.    Physical Exam Updated Vital Signs BP (!) 140/117 (BP Location: Right Arm)   Pulse (!) 107   Temp 97.9 F (36.6 C) (Oral)   Resp 18   Ht 6' (1.829 m)   Wt 81.2 kg (179 lb)   SpO2 100%   BMI 24.28 kg/m   Physical Exam  Constitutional: He is oriented to person, place, and time. He appears well-developed and well-nourished.  HENT:  Head: Normocephalic.  Eyes: EOM are normal.  Neck: Normal range of motion.  Cardiovascular:  Irregularly rhythm. Tachycardic rate  Pulmonary/Chest: Effort normal.  Abdominal: He exhibits no distension.  Musculoskeletal: Normal range of motion.  No dopplerable or palpable flow in the PT DP artery on the left foot.  Abnormal Doppler flow in the left popliteal fossa.  Normal Doppler flow in the left groin.  Left foot is pale.  Is able to wiggle the toes on the left foot  Neurological: He is alert and oriented to person, place, and time.  Psychiatric: He has a normal mood and affect.  Nursing note and vitals reviewed.    ED Treatments / Results  Labs (all labs ordered are listed, but only abnormal results are displayed) Labs Reviewed  CBC  BASIC METABOLIC PANEL  PROTIME-INR  HEPARIN LEVEL  (UNFRACTIONATED)    EKG None  Radiology No results found.  Procedures .Critical Care Performed by: Azalia Bilis, MD Authorized by: Azalia Bilis, MD    CRITICAL CARE Performed by: Azalia Bilis Total critical care time: 35 minutes Critical care time was exclusive of separately billable procedures and treating other patients. Critical care was necessary to treat or prevent imminent or life-threatening deterioration. Critical care was time spent personally by me on the following activities: development of treatment plan with patient and/or surrogate as well as nursing, discussions with consultants, evaluation of patient's response to treatment, examination of patient, obtaining history from patient or surrogate, ordering and performing treatments and interventions, ordering and review of laboratory studies, ordering and review of radiographic studies, pulse oximetry and re-evaluation of patient's condition.   Medications Ordered in ED Medications  diltiazem (CARDIZEM) 1 mg/mL load via infusion 15 mg (15 mg Intravenous Bolus from Bag 09/17/17 2019)    And  diltiazem (CARDIZEM) 100 mg in dextrose 5% (1 mg/mL) infusion (5 mg/hr Intravenous New Bag/Given 09/17/17 2018)  morphine 4 MG/ML injection (has no administration in time range)  heparin ADULT infusion 100 units/mL (25000 units/270mL sodium chloride 0.45%) (1,400 Units/hr Intravenous New Bag/Given 09/17/17 2027)  heparin bolus via infusion 2,000 Units (has no administration in time range)  morphine 4 MG/ML injection 6 mg (6 mg Intravenous Given 09/17/17 2009)     Initial Impression / Assessment and Plan / ED Course  I have reviewed the triage vital signs and the nursing notes.  Pertinent labs & imaging results that were available during my care of the patient were reviewed by me and considered in my medical decision making (see chart for details).   Acute ischemic left lower extremity.  Long-standing history of recurrent  arterial vascular issues in the left lower extremity.  Noncompliance with anticoagulation.  History of paroxysmal A. fib.  Now in A. fib with rapid ventricular response.  Patient will be started on IV Cardizem.  Last dose of Eliquis was yesterday morning.  Patient will be started on heparin at this time.  Emergent vascular surgery consultation needed.  N.p.o. status.  Pain treated.  Patient updated     8:06 PM Spoke  with Dr Edilia Bo of vascular surgery who will evaluate in the ER.   Final Clinical Impressions(s) / ED Diagnoses   Final diagnoses:  Ischemic leg    ED Discharge Orders    None       Azalia Bilis, MD 09/17/17 2028    Azalia Bilis, MD 09/17/17 2044

## 2017-09-17 NOTE — ED Notes (Signed)
Pt keys, wallet ($74($1:x4, $10: X1, $20: x 3)), and cell phone inventoried with security.  Envelope# [phone] Security Envelope#: [phone] Yellow form tubed to OR to follow patient

## 2017-09-17 NOTE — Op Note (Signed)
NAME: Willie Morales    MRN: [id number] DOB: 04/17/1960    DATE OF OPERATION: 09/17/2017  PREOP DIAGNOSIS:    Ischemic left lower extremity secondary to occlusion of left femoropopliteal bypass graft  POSTOP DIAGNOSIS:    Same  PROCEDURE:    1.  Thrombectomy of left femoropopliteal bypass graft 2.  Endarterectomy of left popliteal artery and tibial peroneal trunk 3.  Bovine pericardial patch angioplasty of below-knee popliteal artery and tibioperoneal trunk  SURGEON: Di Kindlehristopher S. Edilia Boickson, MD, FACS  ASSIST: Clinton GallantEmma Collins, PA  ANESTHESIA: General  EBL: Per anesthesia record  INDICATIONS:    Willie Morales is a 57 y.o. male who is undergone multiple previous revascularization attempts on the left leg as outlined in the history and physical.  He presented with an occluded left femoropopliteal bypass graft and was taken urgently to the operating room for attempted thrombectomy of his graft.  FINDINGS:   There was plaque in the popliteal artery extending into the tibial peroneal trunk which was endarterectomized.  The distal anastomosis was patched with a bovine pericardial patch extending down onto the tibial peroneal trunk.  There was a posterior tibial artery and dorsalis pedis arterial signal at the completion of the procedure.  TECHNIQUE:   The patient was taken to the operating room and received a general anesthetic.  The left lower extremity was prepped and draped in usual sterile fashion.  A longitudinal incision was made below the knee and through dense scar tissue the distal aspect of the graft was dissected free.  Separated the distal anastomosis and below-knee popliteal artery from the popliteal vein.  The anterior tibial vein was divided between 2-0 silk ties allowing exposure of the tibioperoneal trunk and takeoff of the anterior tibial artery.  Patient was then heparinized.  A tourniquet was placed on the thigh.  The graft was opened longitudinally at the distal  anastomosis.  Using a #4 Fogarty catheter thrombectomy was performed of the graft and this was passed multiple times until no further clot was retrieved.  There was excellent inflow.  The graft was flushed with heparinized saline and clamped.  Next in order to perform distal thrombectomy tourniquet was placed on the upper thigh the leg was exsanguinated with an Esmarch bandage and the tourniquet inflated to 250 mmHg.  The graftotomy was extended down onto the popliteal artery and there was extensive plaque here.  I continued the dissection down onto the posterior tibial artery.  Next I was able to direct a 3 Fogarty catheter down the anterior tibial artery this was passed multiple times until no further clot was retrieved.  The catheter was then directed down the posterior tibial artery the entire length and no further clot was retrieved.  The plaque in the popliteal artery which extended into the tibioperoneal trunk was endarterectomized and there was a smooth taper and no distal tacking sutures were required.  A bovine pericardial patch was then sewn which started at the distal anastomosis and extended down onto the tibioperoneal trunk.  Prior to completing this anastomosis, the tourniquet was released.  The arteries were backbled and flushed appropriately and the graft flushed.  The anastomosis was completed.  At this point it was a good posterior tibial and anterior tibial signal with the Doppler in the color of the foot improved.  Calf compartments were soft.  Heparin was partially reversed with protamine.  A 15 Blake drain was placed.  Hemostasis was obtained in the wound.  The wound was  then closed with 2 deep layers of 3-0 Vicryl and the skin closed with 4-0 Vicryl.  Sterile dressing was applied.  Patient tolerated the procedure well was transferred to the recovery room in stable condition.  All needle sponge counts were correct.  Waverly Ferrari, MD, FACS [facility]  DATE OF DICTATION:   09/17/2017

## 2017-09-17 NOTE — Progress Notes (Signed)
ANTICOAGULATION CONSULT NOTE - Initial Consult  Pharmacy Consult for heparin Indication: ischemic leg  Allergies  Allergen Reactions  . Ciprofloxacin Rash    Patient Measurements: Height: 6' (182.9 cm) Weight: 179 lb (81.2 kg) IBW/kg (Calculated) : 77.6 Heparin Dosing Weight: 81 Kg  Vital Signs: Temp: 97.9 F (36.6 C) (07/30 1934) Temp Source: Oral (07/30 1934) BP: 181/95 (07/30 2005) Pulse Rate: 50 (07/30 2005)  Labs: No results for input(s): HGB, HCT, PLT, APTT, LABPROT, INR, HEPARINUNFRC, HEPRLOWMOCWT, CREATININE, CKTOTAL, CKMB, TROPONINI in the last 72 hours.  Estimated Creatinine Clearance: 114.5 mL/min (by C-G formula based on SCr of 0.8 mg/dL).   Medical History: Past Medical History:  Diagnosis Date  . Coronary artery disease   . Diabetes mellitus   . Dyslipidemia   . History of kidney stones   . Hypertension   . Paroxysmal atrial fibrillation (HCC) 12/2010  . Tobacco abuse    Assessment: [age] yoM with recent stents in leg here with leg pain and likely occlusion. Discharged on Eliquis, last dose PTA 7/29 in the evening. Dr. Edilia Boickson at bedside preparing to take the patient to the OR. He did not want to give a heparin bolus at this time, but asked to initiate heparin infusion.   Goal of Therapy:  Heparin level 0.3-0.7 units/ml Monitor platelets by anticoagulation protocol: Yes   Plan:  Start heparin infusion at 1400 units/hr Check anti-Xa level in 8 hours and daily while on heparin Continue to monitor H&H and platelets F/u heparin plan post OR  Willie Morales 09/17/2017,8:28 PM

## 2017-09-17 NOTE — ED Triage Notes (Signed)
Pt reports he sat down and when he tried to get up he found he could not bear any weight to his left leg.  Pt has three stints placed in that leg on the 18 of July.  Pale leg, cool to touch but faint pulse, no edema.  Pain 10/10.  Dr. Patria Maneampos bedside in traige

## 2017-09-17 NOTE — H&P (Addendum)
Patient name: Willie Morales MRN: [id number] DOB: 05/17/1960 Sex: male   REASON FOR CONSULT:    Ischemic left lower extremity.  The consult is requested by the emergency department.  HPI:   Willie CarmelGeorge A Morten is a pleasant 57 y.o. male, with an extensive vascular history.  He is undergone multiple procedures by Dr. Leonides SakeBrian Chen.  12/06/2016: He underwent angioplasty and stenting of the left superficial femoral artery for a left foot ulcer.  This subsequently occluded and he developed rest pain.   05/09/2017: He underwent left iliofemoral endarterectomy with Dacron patch angioplasty, left common femoral artery to below-knee popliteal artery bypass with PTFE graft, and bovine pericardial patch angioplasty of the below-knee popliteal artery.  In reviewing the operative report, it was noted that the below-knee popliteal artery, tibial peroneal trunk and anterior tibial artery were heavily calcified.  In addition the left great saphenous vein was harvested but was not adequate to be used as a bypass conduit.   09/05/2017: He underwent angioplasty of the left below the knee popliteal artery and left deep femoral artery in addition to the left external iliac artery.  His arteriogram at that time showed: Serial stenoses in the left external iliac artery just proximal to the endarterectomy patch, a 75% stenosis of the left deep femoral artery which did not change with angioplasty, and a subtotal occlusion of the below-knee popliteal artery below the bypass which improved after angioplasty.    The patient is on Eliquis for atrial fibrillation and tells me that he took his last dose last night.  At 6 PM tonight he noted the sudden onset of pain coolness and paresthesias in the left leg.  He came to the emergency department and was found to have a profoundly ischemic left leg.  In addition he has atrial fibrillation with a rapid response and has been started on Cardizem by the emergency department.  His risk  factors for peripheral vascular disease include diabetes, hypertension, hypercholesterolemia, and tobacco use.  He currently smokes a half a pack per day of cigarettes.  Past Medical History:  Diagnosis Date  . Coronary artery disease   . Diabetes mellitus   . Dyslipidemia   . History of kidney stones   . Hypertension   . Paroxysmal atrial fibrillation (HCC) 12/2010  . Tobacco abuse     Family History  Problem Relation Age of Onset  . Coronary artery disease Father        CABG in early 5750's  . Heart attack Father   . Heart disease Father   . Cancer Mother   . Heart disease Mother   . Heart disease Brother   . Diabetes Maternal Grandmother   . SIDS Sister     SOCIAL HISTORY: He smokes a half a pack per day of cigarettes. Social History   Socioeconomic History  . Marital status: Married    Spouse name: Not on file  . Number of children: Not on file  . Years of education: Not on file  . Highest education level: Not on file  Occupational History  . Occupation: Nutritional therapistplumber    Comment: by trade  Social Needs  . Financial resource strain: Not on file  . Food insecurity:    Worry: Not on file    Inability: Not on file  . Transportation needs:    Medical: Not on file    Non-medical: Not on file  Tobacco Use  . Smoking status: Current Every Day Smoker    Packs/day:  1.00    Years: 35.00    Pack years: 35.00    Types: Cigarettes    Last attempt to quit: 12/18/2015    Years since quitting: 1.7  . Smokeless tobacco: Never Used  . Tobacco comment: 3/4 pack per day  Substance and Sexual Activity  . Alcohol use: No  . Drug use: No  . Sexual activity: Not on file  Lifestyle  . Physical activity:    Days per week: Not on file    Minutes per session: Not on file  . Stress: Not on file  Relationships  . Social connections:    Talks on phone: Not on file    Gets together: Not on file    Attends religious service: Not on file    Active member of club or organization: Not on  file    Attends meetings of clubs or organizations: Not on file    Relationship status: Not on file  . Intimate partner violence:    Fear of current or ex partner: Not on file    Emotionally abused: Not on file    Physically abused: Not on file    Forced sexual activity: Not on file  Other Topics Concern  . Not on file  Social History Narrative   Has 2 children    Lives in [place] with wife   Currently unemployed    Allergies  Allergen Reactions  . Ciprofloxacin Rash    Current Facility-Administered Medications  Medication Dose Route Frequency Provider Last Rate Last Dose  . diltiazem (CARDIZEM) 100 mg in dextrose 5% (1 mg/mL) infusion  5-15 mg/hr Intravenous Continuous Azalia Bilis, MD 5 mL/hr at 09/17/17 2030 5 mg/hr at 09/17/17 2030  . heparin ADULT infusion 100 units/mL (25000 units/226mL sodium chloride 0.45%)  1,400 Units/hr Intravenous Continuous Smitty Cords, RPH 14 mL/hr at 09/17/17 2030 1,400 Units/hr at 09/17/17 2030  . morphine 4 MG/ML injection            Current Outpatient Medications  Medication Sig Dispense Refill  . apixaban (ELIQUIS) 5 MG TABS tablet Take 1 tablet (5 mg total) by mouth 2 (two) times daily. 60 tablet 6  . atorvastatin (LIPITOR) 10 MG tablet Take 10 mg by mouth daily.   1  . benazepril (LOTENSIN) 10 MG tablet Take 1 tablet (10 mg total) by mouth daily. 30 tablet 6  . clopidogrel (PLAVIX) 75 MG tablet Take 1 tablet (75 mg total) by mouth daily. 30 tablet 11  . gabapentin (NEURONTIN) 300 MG capsule Take 1 capsule (300 mg total) by mouth 3 (three) times daily. 90 capsule 11  . glimepiride (AMARYL) 4 MG tablet Take 1 tablet (4 mg total) by mouth daily with breakfast. 30 tablet 6  . HYDROcodone-acetaminophen (NORCO/VICODIN) 5-325 MG tablet Take 1 tablet by mouth every 6 (six) hours as needed for moderate pain. 20 tablet 0  . levothyroxine (SYNTHROID, LEVOTHROID) 50 MCG tablet Take 25 mcg by mouth daily before breakfast.     . metoprolol  tartrate (LOPRESSOR) 25 MG tablet TAKE ONE TABLET BY MOUTH TWICE DAILY 60 tablet 6  . sitaGLIPtin-metformin (JANUMET) 50-1000 MG tablet Take 1 tablet by mouth 2 (two) times daily.      REVIEW OF SYSTEMS:  [X]  denotes positive finding, [ ]  denotes negative finding Cardiac  Comments:  Chest pain or chest pressure:    Shortness of breath upon exertion:    Short of breath when lying flat:    Irregular heart rhythm: x  Vascular    Pain in calf, thigh, or hip brought on by ambulation:    Pain in feet at night that wakes you up from your sleep:  x   Blood clot in your veins:    Leg swelling:         Pulmonary    Oxygen at home:    Productive cough:     Wheezing:         Neurologic    Sudden weakness in arms or legs:  x  left leg  Sudden numbness in arms or legs:  x  left leg  Sudden onset of difficulty speaking or slurred speech:    Temporary loss of vision in one eye:     Problems with dizziness:         Gastrointestinal    Blood in stool:     Vomited blood:         Genitourinary    Burning when urinating:     Blood in urine:        Psychiatric    Major depression:         Hematologic    Bleeding problems:    Problems with blood clotting too easily:        Skin    Rashes or ulcers:        Constitutional    Fever or chills:     PHYSICAL EXAM:   Vitals:   09/17/17 1934 09/17/17 1958 09/17/17 2000 09/17/17 2005  BP: (!) 140/117 (!) 166/139  (!) 181/95  Pulse: (!) 107  (!) 107 (!) 50  Resp: 18  19 (!) 21  Temp: 97.9 F (36.6 C)     TempSrc: Oral     SpO2: 100%  99% 99%  Weight: 179 lb (81.2 kg)     Height: 6' (1.829 m)       GENERAL: The patient is a well-nourished male, in no acute distress. The vital signs are documented above. CARDIAC: There is an irregular rhythm with a rapid rate. VASCULAR: He has a left carotid bruit. He has slightly diminished but palpable femoral pulses. On the left side, which is the symptomatic side, there is no Doppler  signals in the left foot.  Left foot is cool and pale with decreased motor and sensory function. On the right side, he has monophasic but brisk dorsalis pedis and posterior tibial signals with the Doppler.  PULMONARY: There is good air exchange bilaterally without wheezing or rales. ABDOMEN: Soft and non-tender with normal pitched bowel sounds.  MUSCULOSKELETAL: There are no major deformities or cyanosis. NEUROLOGIC: No focal weakness or paresthesias are detected. SKIN: There are no ulcers or rashes noted. PSYCHIATRIC: The patient has a normal affect.  DATA:    LABS: Pending  ARTERIOGRAM: I have reviewed his arteriogram that was performed  months 09/05/2017.  He has multi-level disease.  He has disease in the external iliac artery above his Dacron patch in addition to a stenosis in the proximal deep femoral artery.  He has disease in the popliteal artery below the bypass which was addressed with angioplasty.  His tibial vessels are small.  MEDICAL ISSUES:   ISCHEMIC LEFT LOWER EXTREMITY: This patient has profound ischemia of the left lower extremity with an occluded left femoropopliteal bypass graft.  He has a diminished but palpable left femoral pulse.  I have reviewed his previous arteriogram and he has multiple issues involved.  He has some iliac disease proximally which was addressed with angioplasty.  He  has an extensive endarterectomy with patch angioplasty in the left groin but has a stenosis in the deep femoral artery proximally.  His bypass graft was to a calcified below-knee popliteal artery with a Linton patch.  Below that there was a subtotal occlusion which was addressed with angioplasty.  The tibial vessels are small.  I have explained that without attempted revascularization he clearly has a limb threatening problem.  His only chance for limb salvage is attempted thrombectomy of his bypass graft and possible revision or possible angioplasty.  We have also discussed the potential need  for fasciotomy.  We will proceed urgently.  He states that he last ate at 1 PM.  He is on Eliquis however given the severity of his ischemia I think we need to proceed urgently despite the increased risk for bleeding.  I have discussed the situation with his family member on the phone and with the patient.  Waverly Ferrari [facility] [phone]

## 2017-09-17 NOTE — Anesthesia Procedure Notes (Signed)
Procedure Name: Intubation Date/Time: 09/17/2017 9:45 PM Performed by: Claudina LickMahony, Nevyn Bossman D, CRNA Pre-anesthesia Checklist: Patient identified, Emergency Drugs available, Suction available, Patient being monitored and Timeout performed Patient Re-evaluated:Patient Re-evaluated prior to induction Oxygen Delivery Method: Circle system utilized Preoxygenation: Pre-oxygenation with 100% oxygen Induction Type: IV induction Ventilation: Mask ventilation without difficulty Laryngoscope Size: Miller and 2 Grade View: Grade I Tube type: Subglottic suction tube Tube size: 7.5 mm Number of attempts: 1 Airway Equipment and Method: Stylet Placement Confirmation: ETT inserted through vocal cords under direct vision,  positive ETCO2 and breath sounds checked- equal and bilateral Secured at: 23 cm Tube secured with: Tape Dental Injury: Teeth and Oropharynx as per pre-operative assessment

## 2017-09-17 NOTE — Anesthesia Preprocedure Evaluation (Signed)
Anesthesia Evaluation  Patient identified by MRN, date of birth, ID band Patient awake    Reviewed: Allergy & Precautions, NPO status , Patient's Chart, lab work & pertinent test results  Airway Mallampati: I  TM Distance: >3 FB Neck ROM: Full    Dental  (+) Edentulous Upper, Loose, Dental Advisory Given,    Pulmonary Current Smoker,     + decreased breath sounds      Cardiovascular hypertension, Pt. on medications and Pt. on home beta blockers + CAD and + Peripheral Vascular Disease  + dysrhythmias Atrial Fibrillation  Rhythm:Irregular Rate:Tachycardia     Neuro/Psych negative neurological ROS  negative psych ROS   GI/Hepatic negative GI ROS, Neg liver ROS,   Endo/Other  diabetes, Type 2, Oral Hypoglycemic Agents  Renal/GU negative Renal ROS     Musculoskeletal negative musculoskeletal ROS (+)   Abdominal Normal abdominal exam  (+)   Peds  Hematology negative hematology ROS (+)   Anesthesia Other Findings   Reproductive/Obstetrics                             Lab Results  Component Value Date   WBC 17.9 (H) 09/17/2017   HGB 15.2 09/17/2017   HCT 47.2 09/17/2017   MCV 92.5 09/17/2017   PLT 167 09/17/2017   Lab Results  Component Value Date   CREATININE 0.80 09/05/2017   BUN 11 09/05/2017   NA 139 09/05/2017   K 4.2 09/05/2017   CL 104 09/05/2017   CO2 24 05/10/2017   Lab Results  Component Value Date   INR 1.13 09/17/2017   INR 1.11 05/09/2017   INR 1.0 07/30/2007   EKG: atrial fibrillation.   Anesthesia Physical Anesthesia Plan  ASA: III  Anesthesia Plan: General   Post-op Pain Management:    Induction: Intravenous  PONV Risk Score and Plan: 2 and Ondansetron and Treatment may vary due to age or medical condition  Airway Management Planned: Oral ETT  Additional Equipment: Arterial line  Intra-op Plan:   Post-operative Plan: Extubation in OR  Informed  Consent: I have reviewed the patients History and Physical, chart, labs and discussed the procedure including the risks, benefits and alternatives for the proposed anesthesia with the patient or authorized representative who has indicated his/her understanding and acceptance.   Dental advisory given  Plan Discussed with: CRNA  Anesthesia Plan Comments:         Anesthesia Quick Evaluation

## 2017-09-18 ENCOUNTER — Encounter (HOSPITAL_COMMUNITY): Payer: Self-pay | Admitting: Vascular Surgery

## 2017-09-18 DIAGNOSIS — L8990 Pressure ulcer of unspecified site, unspecified stage: Secondary | ICD-10-CM

## 2017-09-18 LAB — BASIC METABOLIC PANEL
Anion gap: 8 (ref 5–15)
BUN: 15 mg/dL (ref 6–20)
CO2: 23 mmol/L (ref 22–32)
Calcium: 8.8 mg/dL — ABNORMAL LOW (ref 8.9–10.3)
Chloride: 107 mmol/L (ref 98–111)
Creatinine, Ser: 0.93 mg/dL (ref 0.61–1.24)
GFR calc Af Amer: >60 mL/min (ref >60)
GFR calc non Af Amer: >60 mL/min (ref >60)
Glucose, Bld: 150 mg/dL — ABNORMAL HIGH (ref 70–99)
Potassium: 3.8 mmol/L (ref 3.5–5.1)
Sodium: 138 mmol/L (ref 135–145)

## 2017-09-18 LAB — GLUCOSE, CAPILLARY: Glucose-Capillary: 157 mg/dL — ABNORMAL HIGH (ref 70–99)

## 2017-09-18 LAB — CBC
HCT: 39.8 % (ref 39.0–52.0)
Hemoglobin: 12.9 g/dL — ABNORMAL LOW (ref 13.0–17.0)
MCH: 30.1 pg (ref 26.0–34.0)
MCHC: 32.4 g/dL (ref 30.0–36.0)
MCV: 93 fL (ref 78.0–100.0)
Platelets: 130 10*3/uL — ABNORMAL LOW (ref 150–400)
RBC: 4.28 MIL/uL (ref 4.22–5.81)
RDW: 14.1 % (ref 11.5–15.5)
WBC: 17.8 10*3/uL — ABNORMAL HIGH (ref 4.0–10.5)

## 2017-09-18 MED ORDER — HYDROMORPHONE HCL 1 MG/ML IJ SOLN
0.2500 mg | INTRAMUSCULAR | Status: DC | PRN
  Administered 2017-09-18 (×2): 0.5 mg via INTRAVENOUS

## 2017-09-18 MED ORDER — CEFAZOLIN SODIUM-DEXTROSE 2-4 GM/100ML-% IV SOLN
2.0000 g | Freq: Three times a day (TID) | INTRAVENOUS | Status: DC
  Administered 2017-09-18: 2 g via INTRAVENOUS
  Filled 2017-09-18 (×2): qty 100

## 2017-09-18 MED ORDER — SODIUM CHLORIDE 0.9 % IV SOLN: 500.0000 mL | Freq: Once | INTRAVENOUS | Status: DC | PRN

## 2017-09-18 MED ORDER — HEPARIN (PORCINE) IN NACL 100-0.45 UNIT/ML-% IJ SOLN
400.0000 [IU]/h | INTRAMUSCULAR | Status: DC
  Administered 2017-09-18: 400 [IU]/h via INTRAVENOUS

## 2017-09-18 MED ORDER — METOPROLOL TARTRATE 25 MG PO TABS
25.0000 mg | ORAL_TABLET | Freq: Two times a day (BID) | ORAL | Status: DC
  Administered 2017-09-18 (×2): 25 mg via ORAL
  Filled 2017-09-18 (×2): qty 1

## 2017-09-18 MED ORDER — PROMETHAZINE HCL 25 MG/ML IJ SOLN: 6.2500 mg | INTRAMUSCULAR | Status: DC | PRN

## 2017-09-18 MED ORDER — MEPERIDINE HCL 50 MG/ML IJ SOLN: 6.2500 mg | INTRAMUSCULAR | Status: DC | PRN

## 2017-09-18 MED ORDER — CLOPIDOGREL BISULFATE 75 MG PO TABS
75.0000 mg | ORAL_TABLET | Freq: Every day | ORAL | Status: DC
  Administered 2017-09-18: 75 mg via ORAL
  Filled 2017-09-18: qty 1

## 2017-09-18 MED ORDER — LABETALOL HCL 5 MG/ML IV SOLN: 10.0000 mg | INTRAVENOUS | Status: DC | PRN

## 2017-09-18 MED ORDER — DOCUSATE SODIUM 100 MG PO CAPS
100.0000 mg | ORAL_CAPSULE | Freq: Every day | ORAL | Status: DC
  Filled 2017-09-18: qty 1

## 2017-09-18 MED ORDER — PHENOL 1.4 % MT LIQD: 1.0000 | OROMUCOSAL | Status: DC | PRN

## 2017-09-18 MED ORDER — HYDRALAZINE HCL 20 MG/ML IJ SOLN: 5.0000 mg | INTRAMUSCULAR | Status: DC | PRN

## 2017-09-18 MED ORDER — ACETAMINOPHEN 325 MG PO TABS: 325.0000 mg | ORAL_TABLET | ORAL | Status: DC | PRN

## 2017-09-18 MED ORDER — SENNOSIDES-DOCUSATE SODIUM 8.6-50 MG PO TABS: 1.0000 | ORAL_TABLET | Freq: Every evening | ORAL | Status: DC | PRN

## 2017-09-18 MED ORDER — HYDROMORPHONE HCL 1 MG/ML IJ SOLN
INTRAMUSCULAR | Status: AC
Start: 2017-09-18 — End: 2017-09-18
  Filled 2017-09-18: qty 1

## 2017-09-18 MED ORDER — ALUM & MAG HYDROXIDE-SIMETH 200-200-20 MG/5ML PO SUSP
15.0000 mL | ORAL | Status: DC | PRN
Start: 2017-09-18 — End: 2017-09-18

## 2017-09-18 MED ORDER — METFORMIN HCL 500 MG PO TABS
1000.0000 mg | ORAL_TABLET | Freq: Two times a day (BID) | ORAL | Status: DC
  Administered 2017-09-18: 1000 mg via ORAL
  Filled 2017-09-18: qty 2

## 2017-09-18 MED ORDER — LACTATED RINGERS IV SOLN
INTRAVENOUS | Status: DC
  Administered 2017-09-18: 01:00:00 via INTRAVENOUS

## 2017-09-18 MED ORDER — GABAPENTIN 300 MG PO CAPS
300.0000 mg | ORAL_CAPSULE | Freq: Three times a day (TID) | ORAL | Status: DC
  Administered 2017-09-18: 300 mg via ORAL
  Filled 2017-09-18: qty 1

## 2017-09-18 MED ORDER — SODIUM CHLORIDE 0.9 % IV SOLN
INTRAVENOUS | Status: DC
  Administered 2017-09-18: 02:00:00 via INTRAVENOUS

## 2017-09-18 MED ORDER — ACETAMINOPHEN 325 MG RE SUPP: 325.0000 mg | RECTAL | Status: DC | PRN

## 2017-09-18 MED ORDER — BISACODYL 5 MG PO TBEC: 5.0000 mg | DELAYED_RELEASE_TABLET | Freq: Every day | ORAL | Status: DC | PRN

## 2017-09-18 MED ORDER — METOPROLOL TARTRATE 5 MG/5ML IV SOLN: 2.0000 mg | INTRAVENOUS | Status: DC | PRN

## 2017-09-18 MED ORDER — ATORVASTATIN CALCIUM 10 MG PO TABS
10.0000 mg | ORAL_TABLET | Freq: Every day | ORAL | Status: DC
  Administered 2017-09-18: 10 mg via ORAL
  Filled 2017-09-18: qty 1

## 2017-09-18 MED ORDER — PANTOPRAZOLE SODIUM 40 MG PO TBEC
40.0000 mg | DELAYED_RELEASE_TABLET | Freq: Every day | ORAL | Status: DC
  Administered 2017-09-18: 40 mg via ORAL
  Filled 2017-09-18: qty 1

## 2017-09-18 MED ORDER — POTASSIUM CHLORIDE CRYS ER 20 MEQ PO TBCR: 20.0000 meq | EXTENDED_RELEASE_TABLET | Freq: Every day | ORAL | Status: DC | PRN

## 2017-09-18 MED ORDER — MORPHINE SULFATE (PF) 2 MG/ML IV SOLN: 1.0000 mg | INTRAVENOUS | Status: DC | PRN

## 2017-09-18 MED ORDER — FLEET ENEMA 7-19 GM/118ML RE ENEM: 1.0000 | ENEMA | Freq: Once | RECTAL | Status: DC | PRN

## 2017-09-18 MED ORDER — SITAGLIPTIN PHOS-METFORMIN HCL 50-1000 MG PO TABS: 1.0000 | ORAL_TABLET | Freq: Two times a day (BID) | ORAL | Status: DC

## 2017-09-18 MED ORDER — LINAGLIPTIN 5 MG PO TABS
5.0000 mg | ORAL_TABLET | Freq: Every day | ORAL | Status: DC
  Administered 2017-09-18: 5 mg via ORAL
  Filled 2017-09-18: qty 1

## 2017-09-18 MED ORDER — GLIMEPIRIDE 4 MG PO TABS
4.0000 mg | ORAL_TABLET | Freq: Every day | ORAL | Status: DC
  Administered 2017-09-18: 4 mg via ORAL
  Filled 2017-09-18: qty 1

## 2017-09-18 MED ORDER — LEVOTHYROXINE SODIUM 25 MCG PO TABS
25.0000 ug | ORAL_TABLET | Freq: Every day | ORAL | Status: DC
  Administered 2017-09-18: 25 ug via ORAL
  Filled 2017-09-18: qty 1

## 2017-09-18 MED ORDER — ONDANSETRON HCL 4 MG/2ML IJ SOLN: 4.0000 mg | Freq: Four times a day (QID) | INTRAMUSCULAR | Status: DC | PRN

## 2017-09-18 MED ORDER — APIXABAN 5 MG PO TABS
5.0000 mg | ORAL_TABLET | Freq: Two times a day (BID) | ORAL | Status: DC
  Administered 2017-09-18: 5 mg via ORAL
  Filled 2017-09-18: qty 1

## 2017-09-18 MED ORDER — BENAZEPRIL HCL 5 MG PO TABS
10.0000 mg | ORAL_TABLET | Freq: Every day | ORAL | Status: DC
  Administered 2017-09-18: 10 mg via ORAL
  Filled 2017-09-18: qty 2

## 2017-09-18 MED ORDER — MAGNESIUM SULFATE 2 GM/50ML IV SOLN: 2.0000 g | Freq: Every day | INTRAVENOUS | Status: DC | PRN

## 2017-09-18 MED ORDER — GUAIFENESIN-DM 100-10 MG/5ML PO SYRP: 15.0000 mL | ORAL_SOLUTION | ORAL | Status: DC | PRN

## 2017-09-18 MED ORDER — HYDROCODONE-ACETAMINOPHEN 5-325 MG PO TABS
1.0000 | ORAL_TABLET | ORAL | Status: DC | PRN
  Administered 2017-09-18 (×2): 2 via ORAL
  Filled 2017-09-18 (×3): qty 2

## 2017-09-18 MED FILL — Thrombin For Soln 5000 Unit: CUTANEOUS | Qty: 3 | Status: AC

## 2017-09-18 NOTE — Progress Notes (Signed)
Patient given valuables from Security Patient open envelope himself cell phone, keys, and wallet present. Patient did not want to look in his wallet at this time.

## 2017-09-18 NOTE — Evaluation (Signed)
Occupational Therapy Evaluation Patient Details Name: Willie Morales MRN: [id number] DOB: 04/20/1960 Today's Date: 09/18/2017    History of Present Illness 57 y.o. male who is undergone multiple previous revascularization attempts on the left leg. S/p left femoropopliteal bypass graft and left below-knee popliteal artery and tibioperoneal trunk 7/30. PMH including DM. HTN, CAD, and kidney stones.      Clinical Impression   PTA, pt was living alone and was independent. Pt currently requiring Min Guard A for LB ADLs and functional mobility with RW. Pt presenting with decreased WBing through LLE due to pain which decreases his balance. Providing pt with education on compensatory techniques for LB ADLs and use of 3N1 as shower chair to increase safety and independence. Pt would benefit from further acute OT to facilitate safe dc. Recommend dc to home once medially stable per physician.    Follow Up Recommendations  No OT follow up;Supervision - Intermittent    Equipment Recommendations  3 in 1 bedside commode    Recommendations for Other Services PT consult     Precautions / Restrictions Precautions Precautions: Fall Restrictions Weight Bearing Restrictions: No      Mobility Bed Mobility Overal bed mobility: Modified Independent             General bed mobility comments: Increased time. No physical A needed  Transfers Overall transfer level: Needs assistance Equipment used: None;Rolling walker (2 wheeled) Transfers: Sit to/from Stand Sit to Stand: Min guard         General transfer comment: MIn GUard A for safety in standing    Balance Overall balance assessment: Needs assistance Sitting-balance support: No upper extremity supported;Feet supported Sitting balance-Leahy Scale: Good     Standing balance support: No upper extremity supported;During functional activity Standing balance-Leahy Scale: Fair Standing balance comment: Able to maintian static standing  without UE support. Benefits from RW during functional mobility.                            ADL either performed or assessed with clinical judgement   ADL Overall ADL's : Needs assistance/impaired Eating/Feeding: Set up;Sitting   Grooming: Set up;Supervision/safety;Standing;Oral care Grooming Details (indicate cue type and reason): Pt with decreased wbing through LLE and maintaining balance with self TDWBing.  Upper Body Bathing: Set up;Supervision/ safety;Sitting   Lower Body Bathing: Min guard;Sit to/from stand   Upper Body Dressing : Set up;Supervision/safety;Sitting   Lower Body Dressing: Min guard;Sit to/from stand Lower Body Dressing Details (indicate cue type and reason): Pt able to don slippers. Presenting with decreased ROM due to pain. Providing verbal education on compensatory techniques for Donning/doffing pants and underwear (but pt reading text froim ex-wife and is distracted. unsure of reception of education). MIn Guard A for safety in standing.  Toilet Transfer: Min guard;Ambulation;RW(Simulated to recliner)           Functional mobility during ADLs: Min guard;Rolling walker General ADL Comments: Pt with decreased WBing through LLE decreasing his balance. Pt slightly impulsive but feel this is at baseline.      Vision Baseline Vision/History: Wears glasses Wears Glasses: Reading only Patient Visual Report: No change from baseline       Perception     Praxis      Pertinent Vitals/Pain Pain Assessment: Faces Faces Pain Scale: Hurts little more Pain Location: LLE Pain Descriptors / Indicators: Constant;Discomfort Pain Intervention(s): Limited activity within patient's tolerance;Monitored during session;Repositioned     Hand Dominance Right  Extremity/Trunk Assessment Upper Extremity Assessment Upper Extremity Assessment: Overall WFL for tasks assessed   Lower Extremity Assessment Lower Extremity Assessment: LLE deficits/detail LLE  Deficits / Details: s/p left femoropopliteal bypass graft. Decreased WBing due to pain LLE Coordination: decreased gross motor   Cervical / Trunk Assessment Cervical / Trunk Assessment: Normal   Communication Communication Communication: No difficulties   Cognition Arousal/Alertness: Awake/alert Behavior During Therapy: WFL for tasks assessed/performed Overall Cognitive Status: Within Functional Limits for tasks assessed                                     General Comments  SpO2 98% on RA.     Exercises     Shoulder Instructions      Home Living Family/patient expects to be discharged to:: Private residence Living Arrangements: Alone Available Help at Discharge: Friend(s);Available PRN/intermittently Type of Home: House Home Access: Stairs to enter [employer] of Steps: 2 Entrance Stairs-Rails: Can reach both Home Layout: One level     Bathroom Shower/Tub: Tub/shower unit;Walk-in shower   Bathroom Toilet: Handicapped height     Home Equipment: Walker - 2 wheels   Additional Comments: Pt with a recent seperation from his wife. Reports that friends will check on him upon dc.      Prior Functioning/Environment Level of Independence: Independent        Comments: ADLs, IADLs, and driving. Pt enjoys taking care of his (140lb) [place] "Buster".        OT Problem List: Decreased range of motion;Decreased activity tolerance;Impaired balance (sitting and/or standing);Decreased knowledge of use of DME or AE;Decreased knowledge of precautions;Pain      OT Treatment/Interventions: Self-care/ADL training;Therapeutic exercise;Energy conservation;DME and/or AE instruction;Therapeutic activities;Patient/family education    OT Goals(Current goals can be found in the care plan section) Acute Rehab OT Goals Patient Stated Goal: "Get home to my Buster" OT Goal Formulation: With patient Time For Goal Achievement: 10/02/17 Potential to Achieve  Goals: Good ADL Goals Pt Will Perform Lower Body Dressing: with modified independence;sit to/from stand Pt Will Transfer to Toilet: with modified independence;ambulating;regular height toilet Pt Will Perform Tub/Shower Transfer: Shower transfer;3 in 1;rolling walker;ambulating;with modified independence  OT Frequency: Min 2X/week   Barriers to D/C:            Co-evaluation              AM-PAC PT "6 Clicks" Daily Activity     Outcome Measure Help from another person eating meals?: None Help from another person taking care of personal grooming?: None Help from another person toileting, which includes using toliet, bedpan, or urinal?: A Little Help from another person bathing (including washing, rinsing, drying)?: A Little Help from another person to put on and taking off regular upper body clothing?: None Help from another person to put on and taking off regular lower body clothing?: A Little 6 Click Score: 21   End of Session Equipment Utilized During Treatment: Rolling walker Nurse Communication: Mobility status  Activity Tolerance: Patient tolerated treatment well Patient left: in chair;with call bell/phone within reach  OT Visit Diagnosis: Unsteadiness on feet (R26.81);Other abnormalities of gait and mobility (R26.89);Muscle weakness (generalized) (M62.81);Pain Pain - Right/Left: Left Pain - part of body: Leg                Time: 1610-9604 OT Time Calculation (min): 23 min Charges:  OT General Charges $OT Visit: 1 Visit OT Evaluation $  OT Eval Moderate Complexity: 1 Mod OT Treatments $Self Care/Home Management : 8-22 mins  Iyonna Rish MSOT, OTR/L Acute Rehab Pager: [phone] Office: [phone]  Theodoro Grist Jah Alarid 09/18/2017, 9:51 AM

## 2017-09-18 NOTE — Progress Notes (Signed)
[facility]  Subjective  - Doing well over all left foot feels much better.   Objective (!) 92/51 60 97.7 F (36.5 C) (Oral) 15 97%  Intake/Output Summary (Last 24 hours) at 09/18/2017 0742 Last data filed at 09/18/2017 0653 Gross per 24 hour  Intake 2236.33 ml  Output 1031 ml  Net 1205.33 ml    Doppler DP/PT left LE JP drain < 10 cc out put Heart irregularly irregular Lungs non labored    Assessment/Planning: POD # 1  1.  Thrombectomy of left femoropopliteal bypass graft 2.  Endarterectomy of left popliteal artery and tibial peroneal trunk 3.  Bovine pericardial patch angioplasty of below-knee popliteal artery and tibioperoneal trunk   Patent bypass with doppler signals Plan to d/c JP drain Patient is adamant about going home today If he ambulates, voids and tolerates PO's I will d/c the drain prior to discharge.   Mosetta Pigeonmma Maureen Linday Rhodes 09/18/2017 7:42 AM --  Laboratory Lab Results: Recent Labs    09/17/17 2002 09/18/17 0436  WBC 17.9* 17.8*  HGB 15.2 12.9*  HCT 47.2 39.8  PLT 167 130*   BMET Recent Labs    09/17/17 2002 09/18/17 0436  NA 138 138  K 4.2 3.8  CL 105 107  CO2 21* 23  GLUCOSE 239* 150*  BUN 20 15  CREATININE 1.24 0.93  CALCIUM 10.0 8.8*    COAG Lab Results  Component Value Date   INR 1.13 09/17/2017   INR 1.11 05/09/2017   INR 1.0 07/30/2007   No results found for: PTT

## 2017-09-18 NOTE — Anesthesia Postprocedure Evaluation (Signed)
Anesthesia Post Note  Patient: Willie Morales  Procedure(s) Performed: THROMBECTOMY LEFT FEMORAL-POPLITEAL BYPASS GRAFT (Left ) PATCH ANGIOPLASTY WITH PERICARDIAL BOVINE PATCH OF DISTAL ANASTOMOSIS (Left ) ENDARTERECTOMY POPLITEAL ARTERY AND TIBIAL FIBULAR TRUNK     Patient location during evaluation: PACU Anesthesia Type: General Level of consciousness: awake and alert Pain management: pain level controlled Vital Signs Assessment: post-procedure vital signs reviewed and stable Respiratory status: spontaneous breathing, nonlabored ventilation, respiratory function stable and patient connected to nasal cannula oxygen Cardiovascular status: blood pressure returned to baseline and stable Postop Assessment: no apparent nausea or vomiting Anesthetic complications: no    Last Vitals:  Vitals:   09/17/17 2005 09/18/17 0012  BP: (!) 181/95 115/73  Pulse: (!) 50 (!) 102  Resp: (!) 21 12  Temp:  36.6 C  SpO2: 99% 99%                  Shelton SilvasKevin D Venancio Chenier

## 2017-09-18 NOTE — Progress Notes (Deleted)
Cardiology Office Note   Date:  09/18/2017   ID:  Willie CarmelGeorge A Morales, DOB 12/08/1960, MRN [id number]  PCP:  Benita StabileHall, John Z, MD  Cardiologist:  Dr.Arida  No chief complaint on file.    History of Present Illness: Willie Morales is a 57 y.o. male who presents for post hospital follow up after admission for ischemic left lower extremity secondary to left femoropopliteal bypass graft. The required thrombectomy and endarterectomy of the left popliteal and tibal peroneal trunk by Dr. Edilia Boickson on 09/17/2017.    Other history includes atrial fibrillation on Eliquis, and was found to be in rapid rhythm on admission to ER on 09/17/2017 in the setting of severe pain of the left leg. He also is being treated for hypertension, hypercholesterolemia, with ongoing tobacco abuse.     Past Medical History:  Diagnosis Date  . Coronary artery disease   . Diabetes mellitus   . Dyslipidemia   . History of kidney stones   . Hypertension   . Paroxysmal atrial fibrillation (HCC) 12/2010  . Tobacco abuse     Past Surgical History:  Procedure Laterality Date  . ABDOMINAL AORTOGRAM W/LOWER EXTREMITY N/A 07/05/2016   Procedure: Abdominal Aortogram w/Lower Extremity;  Surgeon: Fransisco Hertzhen, Brian L, MD;  [facility];  Service: Cardiovascular;  Laterality: N/A;  . ABDOMINAL AORTOGRAM W/LOWER EXTREMITY N/A 05/06/2017   Procedure: ABDOMINAL AORTOGRAM W/LOWER EXTREMITY;  Surgeon: Maeola Harmanain, Brandon Christopher, MD;  [facility];  Service: Cardiovascular;  Laterality: N/A;  . BACK SURGERY  1991  . CARDIAC CATHETERIZATION  07/31/2007   This showed nonobstructive coronary artery disease   He had 30% lesion in the mid RCA, 30% lesion in the PDA, 45% lesion in the LAD and mid  LAD, and 30% lesion in the diagonal.  The left circumflex was normal.  The LV function was 60%.     Marland Kitchen. ENDARTERECTOMY FEMORAL Left 05/09/2017   Procedure: ENDARTERECTOMY FEMORAL WITH DACRON PATCH ANGIOPLASTY;  Surgeon: Fransisco Hertzhen, Brian L, MD;   [facility];  Service: Vascular;  Laterality: Left;  . FEMORAL-POPLITEAL BYPASS GRAFT Left 05/09/2017   Procedure: BYPASS GRAFT FEMORAL-POPLITEAL ARTERY LEFT;  Surgeon: Fransisco Hertzhen, Brian L, MD;  [facility];  Service: Vascular;  Laterality: Left;  . KIDNEY STONE SURGERY  2011  . LOWER EXTREMITY ANGIOGRAPHY N/A 12/06/2016   Procedure: Lower Extremity Angiography;  Surgeon: Fransisco Hertzhen, Brian L, MD;  [facility];  Service: Cardiovascular;  Laterality: N/A;  . LOWER EXTREMITY ANGIOGRAPHY N/A 09/05/2017   Procedure: LOWER EXTREMITY ANGIOGRAPHY;  Surgeon: Fransisco Hertzhen, Brian L, MD;  [facility];  Service: Cardiovascular;  Laterality: N/A;  . PATCH ANGIOPLASTY Left 05/09/2017   Procedure: PATCH ANGIOPLASTY WITH PROPATEN GRAFT  OF BELOW KNEE POPLITEAL ARTERY;  Surgeon: Fransisco Hertzhen, Brian L, MD;  [facility];  Service: Vascular;  Laterality: Left;  . PERIPHERAL VASCULAR ATHERECTOMY Right 07/19/2016   Procedure: Peripheral Vascular Atherectomy;  Surgeon: Fransisco Hertzhen, Brian L, MD;  [facility];  Service: Cardiovascular;  Laterality: Right;  . PERIPHERAL VASCULAR BALLOON ANGIOPLASTY Left 07/05/2016   Procedure: Peripheral Vascular Balloon Angioplasty;  Surgeon: Fransisco Hertzhen, Brian L, MD;  [facility];  Service: Cardiovascular;  Laterality: Left;  SFA  . PERIPHERAL VASCULAR BALLOON ANGIOPLASTY Left 09/05/2017   Procedure: PERIPHERAL VASCULAR BALLOON ANGIOPLASTY;  Surgeon: Fransisco Hertzhen, Brian L, MD;  [facility];  Service: Cardiovascular;  Laterality: Left;  Popliteal, profunda femoral, external iliac  . PERIPHERAL  VASCULAR INTERVENTION  12/06/2016   Procedure: PERIPHERAL VASCULAR INTERVENTION;  Surgeon: Fransisco Hertz, MD;  Location[facility];  Service: Cardiovascular;;  LEFT     No current facility-administered medications for this visit.    No current outpatient medications on file.   Facility-Administered Medications Ordered in Other Visits    Medication Dose Route Frequency Provider Last Rate Last Dose  . 0.9 %  sodium chloride infusion  500 mL Intravenous Once PRN Clinton Gallant M, PA-C      . 0.9 %  sodium chloride infusion   Intravenous Continuous Lars Mage, PA-C 100 mL/hr at 09/18/17 0204    . acetaminophen (TYLENOL) tablet 325-650 mg  325-650 mg Oral Q4H PRN Clinton Gallant M, PA-C       Or  . acetaminophen (TYLENOL) suppository 325-650 mg  325-650 mg Rectal Q4H PRN Lars Mage, PA-C      . alum & mag hydroxide-simeth (MAALOX/MYLANTA) 200-200-20 MG/5ML suspension 15-30 mL  15-30 mL Oral Q2H PRN Clinton Gallant M, PA-C      . apixaban Everlene Balls) tablet 5 mg  5 mg Oral BID Clinton Gallant M, PA-C      . atorvastatin (LIPITOR) tablet 10 mg  10 mg Oral Daily Clinton Gallant M, PA-C      . benazepril (LOTENSIN) tablet 10 mg  10 mg Oral Daily Collins, Emma M, PA-C      . bisacodyl (DULCOLAX) EC tablet 5 mg  5 mg Oral Daily PRN Clinton Gallant M, PA-C      . ceFAZolin (ANCEF) IVPB 2g/100 mL premix  2 g Intravenous Q8H Clinton Gallant M, PA-C 200 mL/hr at 09/18/17 1610 2 g at 09/18/17 9604  . clopidogrel (PLAVIX) tablet 75 mg  75 mg Oral Daily Collins, Emma M, PA-C      . docusate sodium (COLACE) capsule 100 mg  100 mg Oral Daily Clinton Gallant M, PA-C      . gabapentin (NEURONTIN) capsule 300 mg  300 mg Oral TID Clinton Gallant M, PA-C      . glimepiride (AMARYL) tablet 4 mg  4 mg Oral Q breakfast Collins, Emma M, PA-C      . guaiFENesin-dextromethorphan (ROBITUSSIN DM) 100-10 MG/5ML syrup 15 mL  15 mL Oral Q4H PRN Clinton Gallant M, PA-C      . heparin ADULT infusion 100 units/mL (25000 units/210mL sodium chloride 0.45%)  400 Units/hr Intravenous Continuous Lars Mage, PA-C 4 mL/hr at 09/18/17 0208 400 Units/hr at 09/18/17 0208  . hydrALAZINE (APRESOLINE) injection 5 mg  5 mg Intravenous Q20 Min PRN Clinton Gallant M, PA-C      . HYDROcodone-acetaminophen (NORCO/VICODIN) 5-325 MG per tablet 1-2 tablet  1-2 tablet Oral Q4H PRN Lars Mage, PA-C   2 tablet at 09/18/17 5409  . HYDROmorphone (DILAUDID) 1 MG/ML injection           . labetalol (NORMODYNE,TRANDATE) injection 10 mg  10 mg Intravenous Q10 min PRN Clinton Gallant M, PA-C      . levothyroxine (SYNTHROID, LEVOTHROID) tablet 25 mcg  25 mcg Oral QAC breakfast Clinton Gallant M, PA-C   25 mcg at 09/18/17 0636  . linagliptin (TRADJENTA) tablet 5 mg  5 mg Oral Daily Clinton Gallant M, PA-C      . magnesium sulfate IVPB 2 g 50 mL  2 g Intravenous Daily PRN Clinton Gallant M, PA-C      . metFORMIN (GLUCOPHAGE) tablet 1,000 mg  1,000 mg Oral BID WC Clinton Gallant  M, PA-C      . metoprolol tartrate (LOPRESSOR) injection 2-5 mg  2-5 mg Intravenous Q2H PRN Clinton Gallant M, PA-C      . metoprolol tartrate (LOPRESSOR) tablet 25 mg  25 mg Oral BID Clinton Gallant M, PA-C   25 mg at 09/18/17 0224  . morphine 2 MG/ML injection 1-2 mg  1-2 mg Intravenous Q2H PRN Clinton Gallant M, PA-C      . morphine 4 MG/ML injection           . ondansetron (ZOFRAN) injection 4 mg  4 mg Intravenous Q6H PRN Clinton Gallant M, PA-C      . pantoprazole (PROTONIX) EC tablet 40 mg  40 mg Oral Daily Collins, Emma M, PA-C      . phenol (CHLORASEPTIC) mouth spray 1 spray  1 spray Mouth/Throat PRN Clinton Gallant M, PA-C      . potassium chloride SA (K-DUR,KLOR-CON) CR tablet 20-40 mEq  20-40 mEq Oral Daily PRN Clinton Gallant M, PA-C      . senna-docusate (Senokot-S) tablet 1 tablet  1 tablet Oral QHS PRN Clinton Gallant M, PA-C      . sodium phosphate (FLEET) 7-19 GM/118ML enema 1 enema  1 enema Rectal Once PRN Lars Mage, PA-C        Allergies:   Ciprofloxacin    Social History:  The patient  reports that he has been smoking cigarettes.  He has a 35.00 pack-year smoking history. He has never used smokeless tobacco. He reports that he does not drink alcohol or use drugs.   Family History:  The patient's family history includes Cancer in his mother; Coronary artery disease in his father; Diabetes in his maternal  grandmother; Heart attack in his father; Heart disease in his brother, father, and mother; SIDS in his sister.    ROS: All other systems are reviewed and negative. Unless otherwise mentioned in H&P    PHYSICAL EXAM: VS:  There were no vitals taken for this visit. , BMI There is no height or weight on file to calculate BMI. GEN: Well nourished, well developed, in no acute distress  HEENT: normal  Neck: no JVD, carotid bruits, or masses Cardiac: ***RRR; no murmurs, rubs, or gallops,no edema  Respiratory:  clear to auscultation bilaterally, normal work of breathing GI: soft, nontender, nondistended, + BS MS: no deformity or atrophy  Skin: warm and dry, no rash Neuro:  Strength and sensation are intact Psych: euthymic mood, full affect   EKG:  EKG {ACTION; IS/IS [id number]} ordered today. The ekg ordered today demonstrates ***   Recent Labs: 05/09/2017: ALT 14 09/18/2017: BUN 15; Creatinine, Ser 0.93; Hemoglobin 12.9; Platelets 130; Potassium 3.8; Sodium 138    Lipid Panel    Component Value Date/Time   CHOL 189 03/20/2016 0755   TRIG 185 (H) 03/20/2016 0755   HDL 50 03/20/2016 0755   CHOLHDL 3.8 03/20/2016 0755   VLDL 37 03/20/2016 0755   LDLCALC 102 (H) 03/20/2016 0755      Wt Readings from Last 3 Encounters:  09/18/17 174 lb 2.6 oz (79 kg)  09/05/17 178 lb (80.7 kg)  08/14/17 175 lb (79.4 kg)      Other studies Reviewed: Additional studies/ records that were reviewed today include: ***. Review of the above records demonstrates: ***   ASSESSMENT AND PLAN:  1.  ***   Current medicines are reviewed at length with the patient today.    Labs/ tests ordered today include: *** Bettey Mare. Liborio Nixon,  ANP, [facility]   09/18/2017 7:36 AM    [facility] [address], [place], [place] [address] Phone: [phone]; Fax: [phone]

## 2017-09-18 NOTE — Discharge Instructions (Signed)
 [facility]  Discharge instructions  Lower Extremity Bypass Surgery  Please refer to the following instruction for your post-procedure care. Your surgeon or physician assistant will discuss any changes with you.  Activity  You are encouraged to walk as much as you can. You can slowly return to normal activities during the month after your surgery. Avoid strenuous activity and heavy lifting until your doctor tells you it's OK. Avoid activities such as vacuuming or swinging a golf club. Do not drive until your doctor give the OK and you are no longer taking prescription pain medications. It is also normal to have difficulty with sleep habits, eating and bowel movement after surgery. These will go away with time.  Bathing/Showering  You may shower after you go home. Do not soak in a bathtub, hot tub, or swim until the incision heals completely.  Incision Care  Clean your incision with mild soap and water. Shower every day. Pat the area dry with a clean towel. You do not need a bandage unless otherwise instructed. Do not apply any ointments or creams to your incision. If you have open wounds you will be instructed how to care for them or a visiting nurse may be arranged for you. If you have staples or sutures along your incision they will be removed at your post-op appointment. You may have skin glue on your incision. Do not peel it off. It will come off on its own in about one week. If you have a great deal of moisture in your groin, use a gauze help keep this area dry.  Diet  Resume your normal diet. There are no special food restrictions following this procedure. A low fat/ low cholesterol diet is recommended for all patients with vascular disease. In order to heal from your surgery, it is CRITICAL to get adequate nutrition. Your body requires vitamins, minerals, and protein. Vegetables are the best source of vitamins and minerals. Vegetables also provide the  perfect balance of protein. Processed food has little nutritional value, so try to avoid this.  Medications  Resume taking all your medications unless your doctor or nurse practitioner tells you not to. If your incision is causing pain, you may take over-the-counter pain relievers such as acetaminophen (Tylenol). If you were prescribed a stronger pain medication, please aware these medication can cause nausea and constipation. Prevent nausea by taking the medication with a snack or meal. Avoid constipation by drinking plenty of fluids and eating foods with high amount of fiber, such as fruits, vegetables, and grains. Take Colase 100 mg (an over-the-counter stool softener) twice a day as needed for constipation. Do not take Tylenol if you are taking prescription pain medications.  Follow Up  Our office will schedule a follow up appointment 2-3 weeks following discharge.  Please call us immediately for any of the following conditions  Severe or worsening pain in your legs or feet while at rest or while walking Increase pain, redness, warmth, or drainage (pus) from your incision site(s) Fever of 101 degree or higher The swelling in your leg with the bypass suddenly worsens and becomes more painful than when you were in the hospital If you have been instructed to feel your graft pulse then you should do so every day. If you can no longer feel this pulse, call the office immediately. Not all patients are given this instruction.  Leg swelling is common after leg bypass surgery.  The swelling should improve over a few months   following surgery. To improve the swelling, you may elevate your legs above the level of your heart while you are sitting or resting. Your surgeon or physician assistant may ask you to apply an ACE wrap or wear compression (TED) stockings to help to reduce swelling.  Reduce your risk of vascular disease  Stop smoking. If you would like help call [facility] at 1-800-QUIT-NOW  (1-800-784-8669) or [place] at 336-586-4000.  Manage your cholesterol Maintain a desired weight Control your diabetes weight Control your diabetes Keep your blood pressure down  If you have any questions, please call the office at [phone]   

## 2017-09-18 NOTE — Progress Notes (Signed)
Left knee/thigh JP drained removed without difficulty. Dry sterile dressing applied.

## 2017-09-18 NOTE — Evaluation (Signed)
Physical Therapy Evaluation Patient Details Name: Willie Morales MRN: [id number] DOB: 11-22-1960 Today's Date: 09/18/2017   History of Present Illness  57 y.o. male who is undergone multiple previous revascularization attempts on the left leg. S/p left femoropopliteal bypass graft and left below-knee popliteal artery and tibioperoneal trunk 7/30. PMH including DM. HTN, CAD, and kidney stones.   Clinical Impression  Pt presents to PT able to amb with rolling walker. Tends to rush with movements. Instructed pt to slow down when moving.    Follow Up Recommendations No PT follow up    Equipment Recommendations  None recommended by PT    Recommendations for Other Services       Precautions / Restrictions Precautions Precautions: Fall Restrictions Weight Bearing Restrictions: No      Mobility  Bed Mobility Overal bed mobility: Modified Independent             General bed mobility comments: Pt up in chair  Transfers Overall transfer level: Modified independent Equipment used: None Transfers: Sit to/from Stand Sit to Stand: Modified independent (Device/Increase time)         General transfer comment: From recliner  Ambulation/Gait Ambulation/Gait assistance: Supervision Gait Distance (Feet): 200 Feet Assistive device: Rolling walker (2 wheeled) Gait Pattern/deviations: Step-through pattern;Decreased stance time - left;Decreased step length - right;Decreased weight shift to left;Antalgic   Gait velocity interpretation: >2.62 ft/sec, indicative of community ambulatory General Gait Details: Supervision for safety and verbal cues to slow down and not rush. Pt with weight bearing on forefoot on rt. encouraged to work toward getting heel down  Careers information officer    Modified Rankin (Stroke Patients Only)       Balance Overall balance assessment: Needs assistance Sitting-balance support: No upper extremity supported;Feet supported Sitting  balance-Leahy Scale: Good     Standing balance support: No upper extremity supported;During functional activity Standing balance-Leahy Scale: Fair Standing balance comment: Able to maintian static standing without UE support. Benefits from RW during functional mobility.                              Pertinent Vitals/Pain Pain Assessment: 0-10 Pain Score: 7  Faces Pain Scale: Hurts little more Pain Location: LLE Pain Descriptors / Indicators: Constant;Discomfort Pain Intervention(s): Limited activity within patient's tolerance;Monitored during session;Patient requesting pain meds-RN notified    Home Living Family/patient expects to be discharged to:: Private residence Living Arrangements: Alone Available Help at Discharge: Friend(s);Available PRN/intermittently Type of Home: House Home Access: Stairs to enter Entrance Stairs-Rails: Can reach both Entrance Stairs-Number of Steps: 2 Home Layout: One level Home Equipment: Walker - 2 wheels Additional Comments: Pt with a recent seperation from his wife. Reports that friends will check on him upon dc.    Prior Function Level of Independence: Independent         Comments: ADLs, IADLs, and driving. Pt enjoys taking care of his (140lb) [place] "Buster".     Hand Dominance   Dominant Hand: Right    Extremity/Trunk Assessment   Upper Extremity Assessment Upper Extremity Assessment: Overall WFL for tasks assessed    Lower Extremity Assessment Lower Extremity Assessment: LLE deficits/detail LLE Deficits / Details: s/p left femoropopliteal bypass graft. Decreased WBing due to pain LLE Coordination: decreased gross motor(due to pain)    Cervical / Trunk Assessment Cervical / Trunk Assessment: Normal  Communication   Communication: No difficulties  Cognition Arousal/Alertness: Awake/alert Behavior During Therapy: WFL for tasks assessed/performed;Impulsive Overall Cognitive Status: Within Functional Limits  for tasks assessed                                        General Comments General comments (skin integrity, edema, etc.): SpO2 98% on RA.     Exercises     Assessment/Plan    PT Assessment Patent does not need any further PT services  PT Problem List         PT Treatment Interventions      PT Goals (Current goals can be found in the Care Plan section)  Acute Rehab PT Goals Patient Stated Goal: "Get home to my Buster" PT Goal Formulation: All assessment and education complete, DC therapy    Frequency     Barriers to discharge        Co-evaluation               AM-PAC PT "6 Clicks" Daily Activity  Outcome Measure Difficulty turning over in bed (including adjusting bedclothes, sheets and blankets)?: None Difficulty moving from lying on back to sitting on the side of the bed? : None Difficulty sitting down on and standing up from a chair with arms (e.g., wheelchair, bedside commode, etc,.)?: A Little Help needed moving to and from a bed to chair (including a wheelchair)?: None Help needed walking in hospital room?: A Little Help needed climbing 3-5 steps with a railing? : A Little 6 Click Score: 21    End of Session   Activity Tolerance: Patient tolerated treatment well Patient left: in chair;with call bell/phone within reach;with nursing/sitter in room Nurse Communication: Mobility status;Patient requests pain meds PT Visit Diagnosis: Pain;Other abnormalities of gait and mobility (R26.89) Pain - Right/Left: Left Pain - part of body: Leg    Time: [id number] PT Time Calculation (min) (ACUTE ONLY): 13 min   Charges:   PT Evaluation $PT Eval Low Complexity: 1 Low          [facility] Gregor Dershem PT [phone]   Angelina OkCary W [facility] 09/18/2017, 12:22 PM

## 2017-09-18 NOTE — Progress Notes (Signed)
3/1 to be delivered to room, patient denied any other DME needs, has RW at home already. No other CM needs identified.

## 2017-09-18 NOTE — Progress Notes (Signed)
Patient received from PACU via bed with R.N. Alert and orin. Orin. Patient to room.R.N. Into assess patient.

## 2017-09-18 NOTE — Transfer of Care (Signed)
Immediate Anesthesia Transfer of Care Note  Patient: Willie Morales  Procedure(s) Performed: THROMBECTOMY LEFT FEMORAL-POPLITEAL BYPASS GRAFT (Left ) PATCH ANGIOPLASTY WITH PERICARDIAL BOVINE PATCH OF DISTAL ANASTOMOSIS (Left ) ENDARTERECTOMY POPLITEAL ARTERY AND TIBIAL FIBULAR TRUNK  Patient Location: PACU  Anesthesia Type:General  Level of Consciousness: awake  Airway & Oxygen Therapy: Patient Spontanous Breathing and Patient connected to face mask oxygen  Post-op Assessment: Report given to RN and Post -op Vital signs reviewed and stable  Post vital signs: Reviewed and stable  Last Vitals:  Vitals Value Taken Time  BP 115/73 09/18/2017 12:12 AM  Temp    Pulse 73 09/18/2017 12:13 AM  Resp 15 09/18/2017 12:13 AM  SpO2 99 % 09/18/2017 12:13 AM  Vitals shown include unvalidated device data.  Last Pain:  Vitals:   09/17/17 2029  TempSrc:   PainSc: 10-Worst pain ever         Complications: No apparent anesthesia complications

## 2017-09-19 ENCOUNTER — Ambulatory Visit: Payer: BLUE CROSS/BLUE SHIELD | Admitting: Adult Health

## 2017-09-20 ENCOUNTER — Encounter: Payer: Self-pay | Admitting: Physician Assistant

## 2017-09-20 ENCOUNTER — Ambulatory Visit: Payer: BLUE CROSS/BLUE SHIELD | Admitting: Physician Assistant

## 2017-09-20 VITALS — BP 102/64 | HR 101 | Ht 72.0 in | Wt 172.0 lb

## 2017-09-20 DIAGNOSIS — I739 Peripheral vascular disease, unspecified: Secondary | ICD-10-CM | POA: Diagnosis not present

## 2017-09-20 DIAGNOSIS — I481 Persistent atrial fibrillation: Secondary | ICD-10-CM | POA: Diagnosis not present

## 2017-09-20 DIAGNOSIS — I4819 Other persistent atrial fibrillation: Secondary | ICD-10-CM

## 2017-09-20 DIAGNOSIS — E119 Type 2 diabetes mellitus without complications: Secondary | ICD-10-CM

## 2017-09-20 DIAGNOSIS — I10 Essential (primary) hypertension: Secondary | ICD-10-CM

## 2017-09-20 DIAGNOSIS — E785 Hyperlipidemia, unspecified: Secondary | ICD-10-CM

## 2017-09-20 DIAGNOSIS — I2510 Atherosclerotic heart disease of native coronary artery without angina pectoris: Secondary | ICD-10-CM | POA: Diagnosis not present

## 2017-09-20 MED ORDER — CLOPIDOGREL BISULFATE 75 MG PO TABS: 75.0000 mg | ORAL_TABLET | Freq: Every day | ORAL | 3 refills | Status: DC

## 2017-09-20 NOTE — Patient Instructions (Signed)
Medication Instructions:  Your physician recommends that you continue on your current medications as directed. Please refer to the Current Medication list given to you today.  Labwork: NONE   Testing/Procedures: NONE   Follow-Up: Your physician wants you to follow-up in: 4-6 MONTHS WITH DR ARIDA. You will receive a reminder letter in the mail two months in advance. If you don't receive a letter, please call our office to schedule the follow-up appointment.  Any Other Special Instructions Will Be Listed Below (If Applicable). If you need a refill on your cardiac medications before your next appointment, please call your pharmacy.

## 2017-09-20 NOTE — Discharge Summary (Signed)
[facility] Discharge Summary   Patient ID:  Willie Morales MRN: [id number] DOB/AGE: 09-22-1960 57 y.o.  Admit date: 09/17/2017 Discharge date: 09/18/2017 Date of Surgery: 09/17/2017 - 09/18/2017 Surgeon: Moishe Spice): Chuck Hint, MD  Admission Diagnosis: Ischemic leg [I99.8] Atrial fibrillation with rapid ventricular response (HCC) [I48.91] PAD (peripheral artery disease) (HCC) [I73.9]  Discharge Diagnoses:  Ischemic leg [I99.8] Atrial fibrillation with rapid ventricular response (HCC) [I48.91] PAD (peripheral artery disease) (HCC) [I73.9]  Secondary Diagnoses: Past Medical History:  Diagnosis Date  . Coronary artery disease   . Diabetes mellitus   . Dyslipidemia   . History of kidney stones   . Hypertension   . Paroxysmal atrial fibrillation (HCC) 12/2010  . Tobacco abuse     Procedure(s): THROMBECTOMY LEFT FEMORAL-POPLITEAL BYPASS GRAFT PATCH ANGIOPLASTY WITH PERICARDIAL BOVINE PATCH OF DISTAL ANASTOMOSIS ENDARTERECTOMY POPLITEAL ARTERY AND TIBIAL FIBULAR TRUNK  Discharged Condition: stable  HPI: Willie Morales is a pleasant 57 y.o. male, with an extensive vascular history.  He is undergone multiple procedures by Dr. Leonides Sake.  12/06/2016: He underwent angioplasty and stenting of the left superficial femoral artery for a left foot ulcer.  This subsequently occluded and he developed rest pain.   05/09/2017: He underwent left iliofemoral endarterectomy with Dacron patch angioplasty, left common femoral artery to below-knee popliteal artery bypass with PTFE graft, and bovine pericardial patch angioplasty of the below-knee popliteal artery.  In reviewing the operative report, it was noted that the below-knee popliteal artery, tibial peroneal trunk and anterior tibial artery were heavily calcified.  In addition the left great saphenous vein was harvested but was not adequate to be used as a bypass conduit.   09/05/2017: He underwent angioplasty  of the left below the knee popliteal artery and left deep femoral artery in addition to the left external iliac artery.  His arteriogram at that time showed: Serial stenoses in the left external iliac artery just proximal to the endarterectomy patch, a 75% stenosis of the left deep femoral artery which did not change with angioplasty, and a subtotal occlusion of the below-knee popliteal artery below the bypass which improved after angioplasty.   He noted sudden left LE sudden coolness and numbness and reported to the ED for care.  Dr. Edilia Bo scheduled him for emergent surgery.     Hospital Course:  Willie Morales is a 57 y.o. male is S/P Procedure(s): THROMBECTOMY LEFT FEMORAL-POPLITEAL BYPASS GRAFT PATCH ANGIOPLASTY WITH PERICARDIAL BOVINE PATCH OF DISTAL ANASTOMOSIS ENDARTERECTOMY POPLITEAL ARTERY AND TIBIAL FIBULAR TRUNK  Consults:  Treatment Team:  Chuck Hint, MD   Patent bypass with doppler signals Plan to d/c JP drain Patient is adamant about going home today  Discharged in stable condition.  Significant Diagnostic Studies: CBC Lab Results  Component Value Date   WBC 17.8 (H) 09/18/2017   HGB 12.9 (L) 09/18/2017   HCT 39.8 09/18/2017   MCV 93.0 09/18/2017   PLT 130 (L) 09/18/2017    BMET    Component Value Date/Time   NA 138 09/18/2017 0436   K 3.8 09/18/2017 0436   CL 107 09/18/2017 0436   CO2 23 09/18/2017 0436   GLUCOSE 150 (H) 09/18/2017 0436   BUN 15 09/18/2017 0436   CREATININE 0.93 09/18/2017 0436   CALCIUM 8.8 (L) 09/18/2017 0436   GFRNONAA >60 09/18/2017 0436   GFRAA >60 09/18/2017 0436   COAG Lab Results  Component Value Date   INR 1.13 09/17/2017   INR 1.11 05/09/2017   INR  1.0 07/30/2007     Disposition:  Discharge to :Home Discharge Instructions    Call MD for:  redness, tenderness, or signs of infection (pain, swelling, bleeding, redness, odor or green/yellow discharge around incision site)   Complete by:  As directed     Call MD for:  redness, tenderness, or signs of infection (pain, swelling, bleeding, redness, odor or green/yellow discharge around incision site)   Complete by:  As directed    Call MD for:  severe or increased pain, loss or decreased feeling  in affected limb(s)   Complete by:  As directed    Call MD for:  severe or increased pain, loss or decreased feeling  in affected limb(s)   Complete by:  As directed    Call MD for:  temperature >100.5   Complete by:  As directed    Call MD for:  temperature >100.5   Complete by:  As directed    Resume previous diet   Complete by:  As directed    Resume previous diet   Complete by:  As directed      Allergies as of 09/18/2017      Reactions   Ciprofloxacin Rash      Medication List    TAKE these medications   apixaban 5 MG Tabs tablet Commonly known as:  ELIQUIS Take 1 tablet (5 mg total) by mouth 2 (two) times daily.   atorvastatin 10 MG tablet Commonly known as:  LIPITOR Take 10 mg by mouth daily.   benazepril 10 MG tablet Commonly known as:  LOTENSIN Take 1 tablet (10 mg total) by mouth daily.   gabapentin 300 MG capsule Commonly known as:  NEURONTIN Take 1 capsule (300 mg total) by mouth 3 (three) times daily.   glimepiride 4 MG tablet Commonly known as:  AMARYL Take 1 tablet (4 mg total) by mouth daily with breakfast.   HYDROcodone-acetaminophen 5-325 MG tablet Commonly known as:  NORCO/VICODIN Take 1 tablet by mouth every 6 (six) hours as needed for moderate pain.   levothyroxine 50 MCG tablet Commonly known as:  SYNTHROID, LEVOTHROID Take 25 mcg by mouth daily before breakfast.   metoprolol tartrate 25 MG tablet Commonly known as:  LOPRESSOR TAKE ONE TABLET BY MOUTH TWICE DAILY   sitaGLIPtin-metformin 50-1000 MG tablet Commonly known as:  JANUMET Take 1 tablet by mouth 2 (two) times daily.      Verbal and written Discharge instructions given to the patient. Wound care per Discharge AVS Follow-up Information     Benita StabileHall, John Z, MD.   Specialty:  Internal Medicine Contact information: [address] [place] [facility] [id number] [phone]        Fransisco Hertzhen, Brian L, MD.   Specialties:  Vascular Surgery, Cardiology Contact information: [address] [place] [address] [phone]           Signed: Mosetta Pigeonmma Maureen Alysson Geist 09/20/2017, 10:55 AM - For VQI Registry use --- Instructions: Press F2 to tab through selections.  Delete question if not applicable.   Post-op:  Wound infection: No  Graft infection: No  Transfusion: No  If yes, 0 units given New Arrhythmia: No Ipsilateral amputation: [x ] no, [ ]  Minor, [ ]  BKA, [ ]  AKA Discharge patency: [ ]  Primary, [x ] Primary assisted, [ ]  Secondary, [ ]  Occluded Patency judged by: [x ] Dopper only, [ ]  Palpable graft pulse, [ ]  Palpable distal pulse, [ ]  ABI inc. > 0.15, [ ]  Duplex  D/C Ambulatory Status: Ambulatory  Complications: MI: [x ] No, [ ]  Troponin only, [ ]  EKG or Clinical CHF: No Resp failure: [x ] none, [ ]  Pneumonia, [ ]  Ventilator Chg in renal function: [x ] none, [ ]  Inc. Cr > 0.5, [ ]  Temp. Dialysis, [ ]  Permanent dialysis Stroke: [x ] None, [ ]  Minor, [ ]  Major Return to OR: No  Reason for return to OR: [ ]  Bleeding, [ ]  Infection, [ ]  Thrombosis, [ ]  Revision  Discharge medications: Statin use:  Yes ASA use:  No  for medical reason   Plavix use:  No  for medical reason   Beta blocker use: Yes Coumadin use: No  for medical reason  Eliquis

## 2017-09-20 NOTE — Progress Notes (Signed)
Cardiology Office Note    Date:  09/20/2017   ID:  Willie Morales, DOB 1961/01/30, MRN [id number]  PCP:  Benita Stabile, MD  Cardiologist:  Dr. Kirke Corin   Chief Complaint  Patient presents with  . Follow-up    3 months. Seen for Dr. Kirke Corin    History of Present Illness:  Willie Morales is a 57 y.o. male with PMH of CAD, PAD, DM 2, hyperlipidemia, history of kidney stone, hypertension, tobacco abuse and persistent atrial fibrillation on Eliquis.  Patient apparently had a remote cardiac catheterization that only showed mild CAD.  He has been diagnosed with paroxysmal atrial fibrillation since at least 2012.  He has known severe peripheral arterial disease status post a left femoropopliteal bypass and has been followed by Dr. Imogene Burn of vascular surgery.  More recently, patient has underwent left femoropopliteal bypass graft thrombectomy, he also underwent enterectomy of the left popliteal artery and the tibioperoneal trunk.  Patient presents today for cardiology office visit.  He denies any recent chest pain or shortness of breath.  His left lower extremity is still healing and he has upcoming procedure with Dr. Imogene Burn for lower extremity ABI and arterial ultrasound before his visit.  His leg wound seems to be well-healed without significant bleeding or drainage.  I will refill his Plavix, however will defer the decision to Dr. Imogene Burn to decide the duration to Plavix.  Otherwise he can follow-up with Dr. Kirke Corin in 4 to 6 months.   Past Medical History:  Diagnosis Date  . Coronary artery disease   . Diabetes mellitus   . Dyslipidemia   . History of kidney stones   . Hypertension   . Paroxysmal atrial fibrillation (HCC) 12/2010  . Tobacco abuse     Past Surgical History:  Procedure Laterality Date  . ABDOMINAL AORTOGRAM W/LOWER EXTREMITY N/A 07/05/2016   Procedure: Abdominal Aortogram w/Lower Extremity;  Surgeon: Fransisco Hertz, MD;  Location: [facility] INVASIVE CV LAB;  Service: Cardiovascular;  Laterality:  N/A;  . ABDOMINAL AORTOGRAM W/LOWER EXTREMITY N/A 05/06/2017   Procedure: ABDOMINAL AORTOGRAM W/LOWER EXTREMITY;  Surgeon: Maeola Harman, MD;  Location: [facility] INVASIVE CV LAB;  Service: Cardiovascular;  Laterality: N/A;  . BACK SURGERY  1991  . CARDIAC CATHETERIZATION  07/31/2007   This showed nonobstructive coronary artery disease   He had 30% lesion in the mid RCA, 30% lesion in the PDA, 45% lesion in the LAD and mid  LAD, and 30% lesion in the diagonal.  The left circumflex was normal.  The LV function was 60%.     Marland Kitchen ENDARTERECTOMY FEMORAL Left 05/09/2017   Procedure: ENDARTERECTOMY FEMORAL WITH DACRON PATCH ANGIOPLASTY;  Surgeon: Fransisco Hertz, MD;  Location: [facility] OR;  Service: Vascular;  Laterality: Left;  . ENDARTERECTOMY POPLITEAL  09/17/2017   Procedure: ENDARTERECTOMY POPLITEAL ARTERY AND TIBIAL FIBULAR TRUNK;  Surgeon: Chuck Hint, MD;  Location: [facility] OR;  Service: Vascular;;  . FEMORAL-POPLITEAL BYPASS GRAFT Left 05/09/2017   Procedure: BYPASS GRAFT FEMORAL-POPLITEAL ARTERY LEFT;  Surgeon: Fransisco Hertz, MD;  Location: [facility] OR;  Service: Vascular;  Laterality: Left;  . FEMORAL-POPLITEAL BYPASS GRAFT Left 09/17/2017   Procedure: THROMBECTOMY LEFT FEMORAL-POPLITEAL BYPASS GRAFT;  Surgeon: Chuck Hint, MD;  Location: [facility] OR;  Service: Vascular;  Laterality: Left;  . KIDNEY STONE SURGERY  2011  . LOWER EXTREMITY ANGIOGRAPHY N/A 12/06/2016   Procedure: Lower Extremity Angiography;  Surgeon: Fransisco Hertz, MD;  Location: [facility] INVASIVE CV LAB;  Service: Cardiovascular;  Laterality: N/A;  . LOWER EXTREMITY ANGIOGRAPHY N/A 09/05/2017   Procedure: LOWER EXTREMITY ANGIOGRAPHY;  Surgeon: Fransisco Hertz, MD;  Location: [facility] INVASIVE CV LAB;  Service: Cardiovascular;  Laterality: N/A;  . PATCH ANGIOPLASTY Left 05/09/2017   Procedure: PATCH ANGIOPLASTY WITH PROPATEN GRAFT  OF BELOW KNEE POPLITEAL ARTERY;  Surgeon: Fransisco Hertz, MD;  Location: [facility] OR;  Service: Vascular;  Laterality: Left;   . PATCH ANGIOPLASTY Left 09/17/2017   Procedure: PATCH ANGIOPLASTY WITH PERICARDIAL BOVINE PATCH OF DISTAL ANASTOMOSIS;  Surgeon: Chuck Hint, MD;  Location: [facility] OR;  Service: Vascular;  Laterality: Left;  . PERIPHERAL VASCULAR ATHERECTOMY Right 07/19/2016   Procedure: Peripheral Vascular Atherectomy;  Surgeon: Fransisco Hertz, MD;  Location: [facility] INVASIVE CV LAB;  Service: Cardiovascular;  Laterality: Right;  . PERIPHERAL VASCULAR BALLOON ANGIOPLASTY Left 07/05/2016   Procedure: Peripheral Vascular Balloon Angioplasty;  Surgeon: Fransisco Hertz, MD;  Location: [facility] INVASIVE CV LAB;  Service: Cardiovascular;  Laterality: Left;  SFA  . PERIPHERAL VASCULAR BALLOON ANGIOPLASTY Left 09/05/2017   Procedure: PERIPHERAL VASCULAR BALLOON ANGIOPLASTY;  Surgeon: Fransisco Hertz, MD;  Location: [facility] INVASIVE CV LAB;  Service: Cardiovascular;  Laterality: Left;  Popliteal, profunda femoral, external iliac  . PERIPHERAL VASCULAR INTERVENTION  12/06/2016   Procedure: PERIPHERAL VASCULAR INTERVENTION;  Surgeon: Fransisco Hertz, MD;  Location: [facility] INVASIVE CV LAB;  Service: Cardiovascular;;  LEFT    Current Medications: Outpatient Medications Prior to Visit  Medication Sig Dispense Refill  . apixaban (ELIQUIS) 5 MG TABS tablet Take 1 tablet (5 mg total) by mouth 2 (two) times daily. 60 tablet 6  . atorvastatin (LIPITOR) 10 MG tablet Take 10 mg by mouth daily.   1  . benazepril (LOTENSIN) 10 MG tablet Take 1 tablet (10 mg total) by mouth daily. 30 tablet 6  . gabapentin (NEURONTIN) 300 MG capsule Take 1 capsule (300 mg total) by mouth 3 (three) times daily. 90 capsule 11  . glimepiride (AMARYL) 4 MG tablet Take 1 tablet (4 mg total) by mouth daily with breakfast. 30 tablet 6  . HYDROcodone-acetaminophen (NORCO/VICODIN) 5-325 MG tablet Take 1 tablet by mouth every 6 (six) hours as needed for moderate pain. 20 tablet 0  . levothyroxine (SYNTHROID, LEVOTHROID) 50 MCG tablet Take 25 mcg by mouth daily before breakfast.       . metoprolol tartrate (LOPRESSOR) 25 MG tablet TAKE ONE TABLET BY MOUTH TWICE DAILY 60 tablet 6  . sitaGLIPtin-metformin (JANUMET) 50-1000 MG tablet Take 1 tablet by mouth 2 (two) times daily.    . clopidogrel (PLAVIX) 75 MG tablet Take 1 tablet (75 mg total) by mouth daily. 30 tablet 11   No facility-administered medications prior to visit.      Allergies:   Ciprofloxacin   Social History   Socioeconomic History  . Marital status: Married    Spouse name: Not on file  . Number of children: Not on file  . Years of education: Not on file  . Highest education level: Not on file  Occupational History  . Occupation: Nutritional therapist    Comment: by trade  Social Needs  . Financial resource strain: Not on file  . Food insecurity:    Worry: Not on file    Inability: Not on file  . Transportation needs:    Medical: Not on file    Non-medical: Not on file  Tobacco Use  . Smoking status: Current Every Day Smoker    Packs/day: 1.00    Years: 35.00  Pack years: 35.00    Types: Cigarettes    Last attempt to quit: 12/18/2015    Years since quitting: 1.7  . Smokeless tobacco: Never Used  . Tobacco comment: 3/4 pack per day  Substance and Sexual Activity  . Alcohol use: No  . Drug use: No  . Sexual activity: Not on file  Lifestyle  . Physical activity:    Days per week: Not on file    Minutes per session: Not on file  . Stress: Not on file  Relationships  . Social connections:    Talks on phone: Not on file    Gets together: Not on file    Attends religious service: Not on file    Active member of club or organization: Not on file    Attends meetings of clubs or organizations: Not on file    Relationship status: Not on file  Other Topics Concern  . Not on file  Social History Narrative   Has 2 children    [place] in [place]   Currently unemployed     Family History:  The patient's family history includes Cancer in his mother; Coronary artery disease in his  father; Diabetes in his maternal grandmother; Heart attack in his father; Heart disease in his brother, father, and mother; SIDS in his sister.   ROS:   Please see the history of present illness.    ROS All other systems reviewed and are negative.   PHYSICAL EXAM:   VS:  BP 102/64 (BP Location: Left Arm, Patient Position: Sitting, Cuff Size: Normal)   Pulse (!) 101   Ht 6' (1.829 m)   Wt 172 lb (78 kg)   BMI 23.33 kg/m    GEN: Well nourished, well developed, in no acute distress  HEENT: normal  Neck: no JVD, carotid bruits, or masses Cardiac: Irregularly irregular; no murmurs, rubs, or gallops,no edema  Respiratory:  clear to auscultation bilaterally, normal work of breathing GI: soft, nontender, nondistended, + BS MS: no deformity or atrophy  Skin: warm and dry, no rash Neuro:  Alert and Oriented x 3, Strength and sensation are intact Psych: euthymic mood, full affect  Wt Readings from Last 3 Encounters:  09/20/17 172 lb (78 kg)  09/18/17 174 lb 2.6 oz (79 kg)  09/05/17 178 lb (80.7 kg)      Studies/Labs Reviewed:   EKG:  EKG is ordered today.  The ekg ordered today demonstrates atrial fibrillation, borderline tachycardic, no obvious T wave changes  Recent Labs: 05/09/2017: ALT 14 09/18/2017: BUN 15; Creatinine, Ser 0.93; Hemoglobin 12.9; Platelets 130; Potassium 3.8; Sodium 138   Lipid Panel    Component Value Date/Time   CHOL 189 03/20/2016 0755   TRIG 185 (H) 03/20/2016 0755   HDL 50 03/20/2016 0755   CHOLHDL 3.8 03/20/2016 0755   VLDL 37 03/20/2016 0755   LDLCALC 102 (H) 03/20/2016 0755    Additional studies/ records that were reviewed today include:   Echo 03/20/2016 LV EF: 60% -   65% Study Conclusions  - Left ventricle: The cavity size was normal. Wall thickness was   increased in a pattern of mild LVH. Systolic function was normal.   The estimated ejection fraction was in the range of 60% to 65%.   Wall motion was normal; there were no regional wall  motion   abnormalities. The study was not technically sufficient to allow   evaluation of LV diastolic dysfunction due to atrial   fibrillation. - Aortic  valve: Mildly calcified annulus. Trileaflet; mildly   thickened leaflets. - Mitral valve: Mildly thickened leaflets . There was mild   regurgitation. - Left atrium: The atrium was moderately dilated. - Right ventricle: Systolic function was mildly reduced. - Tricuspid valve: There was mild regurgitation.   ASSESSMENT:    1. Coronary artery disease involving native coronary artery of native heart without angina pectoris   2. Persistent atrial fibrillation (HCC)   3. PAD (peripheral artery disease) (HCC)   4. Controlled type 2 diabetes mellitus without complication, without long-term current use of insulin (HCC)   5. Hyperlipidemia, unspecified hyperlipidemia type   6. Essential hypertension      PLAN:  In order of problems listed above:  1. CAD: No obvious chest discomfort.  Mild disease on remote cardiac catheterization.  2. Persistent atrial fibrillation: On Eliquis, borderline tachycardic today, however heart rate improved since his surgery.  He continued to have some degree of discomfort.  We will continue on the current therapy.  Blood pressure does not allow further titration of rate control medication  3. PAD: Underwent left femoropopliteal procedure, however due to thrombosis of the femoropopliteal graft, he recently underwent extensive procedure including thrombectomy of femoropopliteal graft and enterectomy of left popliteal artery.  Will defer to vascular surgery regarding the duration of Plavix.  He is not on Plavix due to coronary artery disease.   4. Hypertension: Blood pressure stable  5. Hyperlipidemia: On Lipitor 10 mg daily.  Will defer annual lipid panel to primary care provider.  Last lipid panel was in January 2018.  LDL goal less than 70  6. DM2: Managed by primary care provider    Medication  Adjustments/Labs and Tests Ordered: Current medicines are reviewed at length with the patient today.  Concerns regarding medicines are outlined above.  Medication changes, Labs and Tests ordered today are listed in the Patient Instructions below. Patient Instructions  Medication Instructions:  Your physician recommends that you continue on your current medications as directed. Please refer to the Current Medication list given to you today.  Labwork: NONE   Testing/Procedures: NONE   Follow-Up: Your physician wants you to follow-up in: 4-6 MONTHS WITH DR ARIDA. You will receive a reminder letter in the mail two months in advance. If you don't receive a letter, please call our office to schedule the follow-up appointment.  Any Other Special Instructions Will Be Listed Below (If Applicable). If you need a refill on your cardiac medications before your next appointment, please call your pharmacy.     Ramond Dial, [place]  09/20/2017 8:01 PM    [facility] [address] [place], [place], [place]  [address] Phone: [phone]; Fax: [phone]

## 2017-09-23 NOTE — Progress Notes (Signed)
    Postoperative Visit   History of Present Illness   Willie Morales is a 57 y.o. year old male who presents for postoperative follow-up for: TE L fem-pop BPG, EA L pop and TPT, BPA BK pop and TPT by Dr. Edilia Boickson (09/17/17).  The patient's wounds are healing.  The patient notes resolution of lower extremity symptoms.  The patient is able to complete their activities of daily living.  The patient's current symptoms are: resolving incisional pain.  Rest pain is gone from left foot.  Patient is trying to quit smokin..   For VQI Use Only   PRE-ADM LIVING: Home  AMB STATUS: Ambulatory with Assistance   Physical Examination   Vitals:   09/25/17 1041  BP: 108/73  Pulse: 89  Resp: 16  Temp: 98.3 F (36.8 C)  TempSrc: Oral  SpO2: 99%  Weight: 171 lb (77.6 kg)  Height: 6' (1.829 m)    LLE: Incision is healing, pedal pulses are palpable   Non-invasive Vascular Imaging   ABI (09/25/2017)  R:   ABI: 0.88 (0.89),   PT: mono  DP: mono  TBI:  0.89  L:   ABI: 0.99 (0.84),   PT: tri  DP: bi  TBI: 0.84   Medical Decision Making   Willie Morales is a 57 y.o. year old male who presents s/p TE L fem-pop BPG, EA and BPA L pop and TPT   Multiple physicians and staff members have reiterated to this patient he needs to stop smoking.    His distal disease reflects his underlying atherosclerosis in the tibial arteries, aggravated by his continued smoking.    The patient's incisions are healing appropriately with resolution of pre-operative symptoms. I discussed in depth with the patient the nature of atherosclerosis, and emphasized the importance of maximal medical management including strict control of blood pressure, blood glucose, and lipid levels, obtaining regular exercise, and cessation of smoking.  The patient is aware that without maximal medical management the underlying atherosclerotic disease process will progress, limiting the benefit of any interventions. The  patient's surveillance will included ABI, aortoiliac and bypass duplex studies which will be completed in: 3 months, at which time the patient will be re-evaluated.   I emphasized the importance of routine surveillance of the patient's bypass, as the vascular surgery literature emphasize the improved patency possible with assisted primary patency procedures versus secondary patency procedures. The patient agrees to participate in their maximal medical care and routine surveillance. I was able to convince the patient to try Chantix for smoking cessation.  The one week load (Chantix 0.5 mg 1 po daily x 3 days, Chantix 0.5 m 1 po BID x 4 days)  and then maintenance dose (Chantix 1 mg 1 PO BID) was ordered. Thank you for allowing us to participate in this patient's care.   Leonides SakeBrian Jamin Humphries, MD, FACS [facility] [place] Office: [phone] Pager: [phone]

## 2017-09-25 ENCOUNTER — Ambulatory Visit (INDEPENDENT_AMBULATORY_CARE_PROVIDER_SITE_OTHER): Payer: BLUE CROSS/BLUE SHIELD | Admitting: Vascular Surgery

## 2017-09-25 ENCOUNTER — Ambulatory Visit (HOSPITAL_COMMUNITY)
Admission: RE | Admit: 2017-09-25 | Discharge: 2017-09-25 | Disposition: A | Discharge status: Home / Self Care | Payer: BLUE CROSS/BLUE SHIELD | Source: Ambulatory Visit | Attending: Vascular Surgery | Admitting: Vascular Surgery

## 2017-09-25 ENCOUNTER — Encounter: Payer: Self-pay | Admitting: Vascular Surgery

## 2017-09-25 ENCOUNTER — Other Ambulatory Visit: Payer: Self-pay

## 2017-09-25 VITALS — BP 108/73 | HR 89 | Temp 98.3°F | Resp 16 | Ht 72.0 in | Wt 171.0 lb

## 2017-09-25 DIAGNOSIS — I70229 Atherosclerosis of native arteries of extremities with rest pain, unspecified extremity: Secondary | ICD-10-CM

## 2017-09-25 DIAGNOSIS — I779 Disorder of arteries and arterioles, unspecified: Secondary | ICD-10-CM | POA: Diagnosis not present

## 2017-09-25 DIAGNOSIS — Z48812 Encounter for surgical aftercare following surgery on the circulatory system: Secondary | ICD-10-CM | POA: Diagnosis not present

## 2017-09-25 MED ORDER — VARENICLINE TARTRATE 0.5 MG PO TABS: ORAL_TABLET | ORAL | 0 refills | Status: DC

## 2017-09-25 MED ORDER — VARENICLINE TARTRATE 1 MG PO TABS: 1.0000 mg | ORAL_TABLET | Freq: Two times a day (BID) | ORAL | 2 refills | Status: DC

## 2017-09-26 ENCOUNTER — Other Ambulatory Visit: Payer: Self-pay

## 2017-09-26 DIAGNOSIS — Z95828 Presence of other vascular implants and grafts: Secondary | ICD-10-CM

## 2017-09-26 DIAGNOSIS — I779 Disorder of arteries and arterioles, unspecified: Secondary | ICD-10-CM

## 2017-09-26 DIAGNOSIS — I70229 Atherosclerosis of native arteries of extremities with rest pain, unspecified extremity: Secondary | ICD-10-CM

## 2017-10-07 ENCOUNTER — Other Ambulatory Visit: Payer: Self-pay | Admitting: Surgery

## 2017-10-07 MED ORDER — GABAPENTIN 300 MG PO CAPS: 300.0000 mg | ORAL_CAPSULE | Freq: Three times a day (TID) | ORAL | 11 refills | Status: AC

## 2017-10-23 ENCOUNTER — Ambulatory Visit (INDEPENDENT_AMBULATORY_CARE_PROVIDER_SITE_OTHER)
Admission: RE | Admit: 2017-10-23 | Discharge: 2017-10-23 | Disposition: A | Discharge status: Home / Self Care | Payer: BLUE CROSS/BLUE SHIELD | Source: Ambulatory Visit | Attending: Vascular Surgery | Admitting: Vascular Surgery

## 2017-10-23 ENCOUNTER — Ambulatory Visit (HOSPITAL_COMMUNITY)
Admission: RE | Admit: 2017-10-23 | Discharge: 2017-10-23 | Disposition: A | Discharge status: Home / Self Care | Payer: BLUE CROSS/BLUE SHIELD | Source: Ambulatory Visit | Attending: Vascular Surgery | Admitting: Vascular Surgery

## 2017-10-23 DIAGNOSIS — I779 Disorder of arteries and arterioles, unspecified: Secondary | ICD-10-CM

## 2017-10-23 DIAGNOSIS — I708 Atherosclerosis of other arteries: Secondary | ICD-10-CM | POA: Diagnosis not present

## 2017-10-23 DIAGNOSIS — I70229 Atherosclerosis of native arteries of extremities with rest pain, unspecified extremity: Secondary | ICD-10-CM

## 2017-10-23 DIAGNOSIS — Z09 Encounter for follow-up examination after completed treatment for conditions other than malignant neoplasm: Secondary | ICD-10-CM | POA: Insufficient documentation

## 2017-10-23 DIAGNOSIS — Z95828 Presence of other vascular implants and grafts: Secondary | ICD-10-CM | POA: Diagnosis present

## 2017-11-06 ENCOUNTER — Encounter: Payer: Self-pay | Admitting: Vascular Surgery

## 2017-11-06 ENCOUNTER — Ambulatory Visit (HOSPITAL_COMMUNITY)
Admission: RE | Admit: 2017-11-06 | Discharge: 2017-11-06 | Disposition: A | Discharge status: Home / Self Care | Payer: BLUE CROSS/BLUE SHIELD | Source: Ambulatory Visit | Attending: Vascular Surgery | Admitting: Vascular Surgery

## 2017-11-06 ENCOUNTER — Other Ambulatory Visit: Payer: Self-pay

## 2017-11-06 ENCOUNTER — Ambulatory Visit (INDEPENDENT_AMBULATORY_CARE_PROVIDER_SITE_OTHER): Payer: BLUE CROSS/BLUE SHIELD | Admitting: Vascular Surgery

## 2017-11-06 VITALS — BP 111/76 | HR 91 | Temp 98.8°F | Resp 20 | Ht 72.0 in | Wt 175.0 lb

## 2017-11-06 DIAGNOSIS — Z95828 Presence of other vascular implants and grafts: Secondary | ICD-10-CM | POA: Insufficient documentation

## 2017-11-06 DIAGNOSIS — I779 Disorder of arteries and arterioles, unspecified: Secondary | ICD-10-CM

## 2017-11-06 DIAGNOSIS — I70229 Atherosclerosis of native arteries of extremities with rest pain, unspecified extremity: Secondary | ICD-10-CM | POA: Insufficient documentation

## 2017-11-06 DIAGNOSIS — R0989 Other specified symptoms and signs involving the circulatory and respiratory systems: Secondary | ICD-10-CM | POA: Diagnosis present

## 2017-11-06 NOTE — Progress Notes (Signed)
Patient name: Willie Morales MRN: [id number] DOB: 01/16/1961 Sex: male  REASON FOR VISIT:   Follow-up of peripheral vascular disease.  HPI:   Willie Morales is a pleasant 57 y.o. male with an extensive past surgical history. He has undergone multiple procedures by Dr. Leonides Sake.  12/06/2016: He underwent angioplasty and stenting of the left superficial femoral artery for a left foot ulcer.  This subsequently occluded and he developed rest pain.   05/09/2017: He underwent left iliofemoral endarterectomy with Dacron patch angioplasty, left common femoral artery to below-knee popliteal artery bypass with PTFE graft, and bovine pericardial patch angioplasty of the below-knee popliteal artery.  In reviewing the operative report, it was noted that the below-knee popliteal artery, tibial peroneal trunk and anterior tibial artery were heavily calcified.  In addition the left great saphenous vein was harvested but was not adequate to be used as a bypass conduit.   09/05/2017: He underwent angioplasty of the left below the knee popliteal artery and left deep femoral artery in addition to the left external iliac artery.  His arteriogram at that time showed: Serial stenoses in the left external iliac artery just proximal to the endarterectomy patch, a 75% stenosis of the left deep femoral artery which did not change with angioplasty, and a subtotal occlusion of the below-knee popliteal artery below the bypass which improved after angioplasty.    On 09/17/2017, I performed thrombectomy of his left femoropopliteal bypass graft, endarterectomy of the left popliteal artery and tibial peroneal trunk and bovine pericardial patch angioplasty of the below-knee popliteal artery and tibial peroneal trunk.  Of note he is on Eliquis for atrial fibrillation.  He comes in for routine follow-up visit.  Since I saw him last, the patient denies any significant claudication in the left lower extremity.  He does continue to  smoke several cigarettes a day but is trying to quit.  He denies any rest pain.  He denies any history of stroke, TIAs, expressive or receptive aphasia, or amaurosis fugax.  Past Medical History:  Diagnosis Date  . Coronary artery disease   . Diabetes mellitus   . Dyslipidemia   . History of kidney stones   . Hypertension   . Paroxysmal atrial fibrillation (HCC) 12/2010  . Tobacco abuse     Family History  Problem Relation Age of Onset  . Coronary artery disease Father        CABG in early 11's  . Heart attack Father   . Heart disease Father   . Cancer Mother   . Heart disease Mother   . Heart disease Brother   . Diabetes Maternal Grandmother   . SIDS Sister     SOCIAL HISTORY: Social History   Tobacco Use  . Smoking status: Current Every Day Smoker    Packs/day: 1.00    Years: 35.00    Pack years: 35.00    Types: Cigarettes  . Smokeless tobacco: Never Used  . Tobacco comment: 2-3 cigarettes per day  Substance Use Topics  . Alcohol use: No    Allergies  Allergen Reactions  . Ciprofloxacin Rash    Current Outpatient Medications  Medication Sig Dispense Refill  . apixaban (ELIQUIS) 5 MG TABS tablet Take 1 tablet (5 mg total) by mouth 2 (two) times daily. 60 tablet 6  . atorvastatin (LIPITOR) 10 MG tablet Take 10 mg by mouth daily.   1  . benazepril (LOTENSIN) 10 MG tablet Take 1 tablet (10 mg total) by mouth daily. 30  tablet 6  . clopidogrel (PLAVIX) 75 MG tablet Take 1 tablet (75 mg total) by mouth daily. 90 tablet 3  . gabapentin (NEURONTIN) 300 MG capsule Take 1 capsule (300 mg total) by mouth 3 (three) times daily. 90 capsule 11  . glimepiride (AMARYL) 4 MG tablet Take 1 tablet (4 mg total) by mouth daily with breakfast. 30 tablet 6  . HYDROcodone-acetaminophen (NORCO/VICODIN) 5-325 MG tablet Take 1 tablet by mouth every 6 (six) hours as needed for moderate pain. 20 tablet 0  . levothyroxine (SYNTHROID, LEVOTHROID) 50 MCG tablet Take 25 mcg by mouth daily  before breakfast.     . metoprolol tartrate (LOPRESSOR) 25 MG tablet TAKE ONE TABLET BY MOUTH TWICE DAILY 60 tablet 6  . sitaGLIPtin-metformin (JANUMET) 50-1000 MG tablet Take 1 tablet by mouth 2 (two) times daily.    . varenicline (CHANTIX CONTINUING MONTH PAK) 1 MG tablet Take 1 tablet (1 mg total) by mouth 2 (two) times daily. 60 tablet 2  . varenicline (CHANTIX) 0.5 MG tablet Take 0.5 mg by mouth daily for the first three days, then Take 0.5 mg by mouth twice a day for the next four days. 11 tablet 0   No current facility-administered medications for this visit.     REVIEW OF SYSTEMS:  [X]  denotes positive finding, [ ]  denotes negative finding Cardiac  Comments:  Chest pain or chest pressure:    Shortness of breath upon exertion:    Short of breath when lying flat:    Irregular heart rhythm:        Vascular    Pain in calf, thigh, or hip brought on by ambulation:    Pain in feet at night that wakes you up from your sleep:     Blood clot in your veins:    Leg swelling:         Pulmonary    Oxygen at home:    Productive cough:     Wheezing:         Neurologic    Sudden weakness in arms or legs:     Sudden numbness in arms or legs:     Sudden onset of difficulty speaking or slurred speech:    Temporary loss of vision in one eye:     Problems with dizziness:         Gastrointestinal    Blood in stool:     Vomited blood:         Genitourinary    Burning when urinating:     Blood in urine:        Psychiatric    Major depression:         Hematologic    Bleeding problems:    Problems with blood clotting too easily:        Skin    Rashes or ulcers:        Constitutional    Fever or chills:     PHYSICAL EXAM:   Vitals:   11/06/17 1412  BP: 111/76  Pulse: 91  Resp: 20  Temp: 98.8 F (37.1 C)  TempSrc: Oral  SpO2: 98%  Weight: 175 lb (79.4 kg)  Height: 6' (1.829 m)    GENERAL: The patient is a well-nourished male, in no acute distress. The vital signs  are documented above. CARDIAC: There is a regular rate and rhythm.  VASCULAR: He has a left carotid bruit. He has palpable femoral pulses.  I cannot palpate pedal pulses.  Both feet are warm and  well-perfused. He has no significant lower extremity swelling. PULMONARY: There is good air exchange bilaterally without wheezing or rales. ABDOMEN: Soft and non-tender with normal pitched bowel sounds.  MUSCULOSKELETAL: There are no major deformities or cyanosis. NEUROLOGIC: No focal weakness or paresthesias are detected. SKIN: There are no ulcers or rashes noted. PSYCHIATRIC: The patient has a normal affect.  DATA:    DUPLEX OF LEFT LOWER EXTREMITY BYPASS -I have independently interpreted his duplex of his left femoropopliteal bypass.  The graft is widely patent with triphasic flow throughout.  There are no areas of stenosis identified within the graft.  MEDICAL ISSUES:   PERIPHERAL VASCULAR DISEASE: This patient is undergone multiple previous revascularization attempts of the left lower extremity by Dr. Imogene Burn.  I performed the most recent procedure on 09/17/2017.  He had a thrombectomy of his left femoropopliteal bypass graft with endarterectomy of the left popliteal artery and tibial peroneal trunk.  His bypass graft is patent and he is essentially asymptomatic.  We have again discussed the importance of tobacco cessation.  I explained that I would like to see him back in 6 months with a duplex of his graft and ABIs.  He has some strong concerns about his financial situation and therefore does not want to return for 1 year.  In addition he is having a difficult time visiting his son in prison because of his stent in his leg which apparently sets off the alarm.  He lost his card which documents that he has a stent.  I have offered to make him a copy of his operative report but he is not sure that this will satisfy the requirements.  If this still is an issue we could potentially track down his paper  medical record and get formal documentation of his stent.  Waverly Ferrari [facility] [phone]

## 2017-11-27 ENCOUNTER — Encounter (HOSPITAL_COMMUNITY): Payer: BLUE CROSS/BLUE SHIELD

## 2017-12-04 ENCOUNTER — Ambulatory Visit: Payer: BLUE CROSS/BLUE SHIELD | Admitting: Vascular Surgery

## 2017-12-04 ENCOUNTER — Encounter (HOSPITAL_COMMUNITY): Payer: BLUE CROSS/BLUE SHIELD

## 2017-12-11 ENCOUNTER — Encounter: Payer: Self-pay | Admitting: Internal Medicine

## 2018-01-21 ENCOUNTER — Ambulatory Visit: Payer: BLUE CROSS/BLUE SHIELD | Admitting: Cardiovascular Disease

## 2018-01-21 ENCOUNTER — Other Ambulatory Visit: Payer: Self-pay | Admitting: Cardiovascular Disease

## 2018-01-21 NOTE — Telephone Encounter (Signed)
Please review for refills.

## 2018-01-21 NOTE — Telephone Encounter (Signed)
This is a [facility] patient with an appointment scheduled for [facility].

## 2018-01-22 NOTE — Telephone Encounter (Signed)
Rx request sent to pharmacy.  

## 2018-01-23 ENCOUNTER — Emergency Department (HOSPITAL_COMMUNITY): Payer: BLUE CROSS/BLUE SHIELD | Admitting: Critical Care Medicine

## 2018-01-23 ENCOUNTER — Encounter (HOSPITAL_COMMUNITY)
Admission: EM | Disposition: A | Discharge status: Home Health | Payer: Self-pay | Source: Home / Self Care | Attending: Vascular Surgery

## 2018-01-23 ENCOUNTER — Inpatient Hospital Stay (HOSPITAL_COMMUNITY)
Admission: EM | Admit: 2018-01-23 | Discharge: 2018-01-31 | DRG: 270 | Disposition: A | Discharge status: Home Health | Payer: BLUE CROSS/BLUE SHIELD | Attending: Vascular Surgery | Admitting: Vascular Surgery

## 2018-01-23 ENCOUNTER — Encounter (HOSPITAL_COMMUNITY): Payer: Self-pay

## 2018-01-23 DIAGNOSIS — Y92230 Patient room in hospital as the place of occurrence of the external cause: Secondary | ICD-10-CM | POA: Diagnosis not present

## 2018-01-23 DIAGNOSIS — J9601 Acute respiratory failure with hypoxia: Secondary | ICD-10-CM | POA: Diagnosis not present

## 2018-01-23 DIAGNOSIS — IMO0002 Reserved for concepts with insufficient information to code with codable children: Secondary | ICD-10-CM

## 2018-01-23 DIAGNOSIS — I708 Atherosclerosis of other arteries: Secondary | ICD-10-CM | POA: Diagnosis present

## 2018-01-23 DIAGNOSIS — J9600 Acute respiratory failure, unspecified whether with hypoxia or hypercapnia: Secondary | ICD-10-CM

## 2018-01-23 DIAGNOSIS — I952 Hypotension due to drugs: Secondary | ICD-10-CM | POA: Diagnosis not present

## 2018-01-23 DIAGNOSIS — I739 Peripheral vascular disease, unspecified: Secondary | ICD-10-CM

## 2018-01-23 DIAGNOSIS — R05 Cough: Secondary | ICD-10-CM

## 2018-01-23 DIAGNOSIS — F05 Delirium due to known physiological condition: Secondary | ICD-10-CM | POA: Diagnosis not present

## 2018-01-23 DIAGNOSIS — I482 Chronic atrial fibrillation, unspecified: Secondary | ICD-10-CM | POA: Diagnosis present

## 2018-01-23 DIAGNOSIS — T79A0XA Compartment syndrome, unspecified, initial encounter: Secondary | ICD-10-CM | POA: Diagnosis not present

## 2018-01-23 DIAGNOSIS — I340 Nonrheumatic mitral (valve) insufficiency: Secondary | ICD-10-CM | POA: Diagnosis not present

## 2018-01-23 DIAGNOSIS — I472 Ventricular tachycardia: Secondary | ICD-10-CM | POA: Diagnosis not present

## 2018-01-23 DIAGNOSIS — F419 Anxiety disorder, unspecified: Secondary | ICD-10-CM | POA: Diagnosis not present

## 2018-01-23 DIAGNOSIS — Z720 Tobacco use: Secondary | ICD-10-CM | POA: Diagnosis not present

## 2018-01-23 DIAGNOSIS — M6282 Rhabdomyolysis: Secondary | ICD-10-CM | POA: Diagnosis not present

## 2018-01-23 DIAGNOSIS — E1165 Type 2 diabetes mellitus with hyperglycemia: Secondary | ICD-10-CM

## 2018-01-23 DIAGNOSIS — I480 Paroxysmal atrial fibrillation: Secondary | ICD-10-CM | POA: Diagnosis present

## 2018-01-23 DIAGNOSIS — R059 Cough, unspecified: Secondary | ICD-10-CM

## 2018-01-23 DIAGNOSIS — I70229 Atherosclerosis of native arteries of extremities with rest pain, unspecified extremity: Secondary | ICD-10-CM | POA: Diagnosis present

## 2018-01-23 DIAGNOSIS — I361 Nonrheumatic tricuspid (valve) insufficiency: Secondary | ICD-10-CM | POA: Diagnosis not present

## 2018-01-23 DIAGNOSIS — D72829 Elevated white blood cell count, unspecified: Secondary | ICD-10-CM | POA: Diagnosis present

## 2018-01-23 DIAGNOSIS — E1151 Type 2 diabetes mellitus with diabetic peripheral angiopathy without gangrene: Secondary | ICD-10-CM | POA: Diagnosis present

## 2018-01-23 DIAGNOSIS — I998 Other disorder of circulatory system: Secondary | ICD-10-CM

## 2018-01-23 DIAGNOSIS — I9589 Other hypotension: Secondary | ICD-10-CM | POA: Diagnosis not present

## 2018-01-23 DIAGNOSIS — T82868A Thrombosis of vascular prosthetic devices, implants and grafts, initial encounter: Principal | ICD-10-CM | POA: Diagnosis present

## 2018-01-23 DIAGNOSIS — I110 Hypertensive heart disease with heart failure: Secondary | ICD-10-CM | POA: Diagnosis present

## 2018-01-23 DIAGNOSIS — Z79890 Hormone replacement therapy (postmenopausal): Secondary | ICD-10-CM

## 2018-01-23 DIAGNOSIS — T444X5A Adverse effect of predominantly alpha-adrenoreceptor agonists, initial encounter: Secondary | ICD-10-CM | POA: Diagnosis not present

## 2018-01-23 DIAGNOSIS — F17210 Nicotine dependence, cigarettes, uncomplicated: Secondary | ICD-10-CM | POA: Diagnosis present

## 2018-01-23 DIAGNOSIS — Z7902 Long term (current) use of antithrombotics/antiplatelets: Secondary | ICD-10-CM

## 2018-01-23 DIAGNOSIS — E8770 Fluid overload, unspecified: Secondary | ICD-10-CM | POA: Diagnosis not present

## 2018-01-23 DIAGNOSIS — I10 Essential (primary) hypertension: Secondary | ICD-10-CM

## 2018-01-23 DIAGNOSIS — J449 Chronic obstructive pulmonary disease, unspecified: Secondary | ICD-10-CM | POA: Diagnosis present

## 2018-01-23 DIAGNOSIS — E785 Hyperlipidemia, unspecified: Secondary | ICD-10-CM

## 2018-01-23 DIAGNOSIS — E11649 Type 2 diabetes mellitus with hypoglycemia without coma: Secondary | ICD-10-CM | POA: Diagnosis not present

## 2018-01-23 DIAGNOSIS — E876 Hypokalemia: Secondary | ICD-10-CM | POA: Diagnosis not present

## 2018-01-23 DIAGNOSIS — G9340 Encephalopathy, unspecified: Secondary | ICD-10-CM | POA: Diagnosis not present

## 2018-01-23 DIAGNOSIS — Z833 Family history of diabetes mellitus: Secondary | ICD-10-CM

## 2018-01-23 DIAGNOSIS — E7800 Pure hypercholesterolemia, unspecified: Secondary | ICD-10-CM | POA: Diagnosis not present

## 2018-01-23 DIAGNOSIS — Z87442 Personal history of urinary calculi: Secondary | ICD-10-CM

## 2018-01-23 DIAGNOSIS — Z7901 Long term (current) use of anticoagulants: Secondary | ICD-10-CM

## 2018-01-23 DIAGNOSIS — I5031 Acute diastolic (congestive) heart failure: Secondary | ICD-10-CM | POA: Diagnosis not present

## 2018-01-23 DIAGNOSIS — Z79899 Other long term (current) drug therapy: Secondary | ICD-10-CM

## 2018-01-23 DIAGNOSIS — E872 Acidosis: Secondary | ICD-10-CM | POA: Diagnosis not present

## 2018-01-23 DIAGNOSIS — I4891 Unspecified atrial fibrillation: Secondary | ICD-10-CM

## 2018-01-23 DIAGNOSIS — Z881 Allergy status to other antibiotic agents status: Secondary | ICD-10-CM

## 2018-01-23 DIAGNOSIS — Z978 Presence of other specified devices: Secondary | ICD-10-CM

## 2018-01-23 DIAGNOSIS — E039 Hypothyroidism, unspecified: Secondary | ICD-10-CM | POA: Diagnosis present

## 2018-01-23 DIAGNOSIS — J810 Acute pulmonary edema: Secondary | ICD-10-CM | POA: Diagnosis not present

## 2018-01-23 DIAGNOSIS — Z8249 Family history of ischemic heart disease and other diseases of the circulatory system: Secondary | ICD-10-CM

## 2018-01-23 DIAGNOSIS — N179 Acute kidney failure, unspecified: Secondary | ICD-10-CM | POA: Diagnosis not present

## 2018-01-23 DIAGNOSIS — Z8673 Personal history of transient ischemic attack (TIA), and cerebral infarction without residual deficits: Secondary | ICD-10-CM

## 2018-01-23 DIAGNOSIS — Z781 Physical restraint status: Secondary | ICD-10-CM

## 2018-01-23 DIAGNOSIS — I2510 Atherosclerotic heart disease of native coronary artery without angina pectoris: Secondary | ICD-10-CM | POA: Diagnosis present

## 2018-01-23 DIAGNOSIS — Z452 Encounter for adjustment and management of vascular access device: Secondary | ICD-10-CM

## 2018-01-23 HISTORY — PX: LOWER EXTREMITY ANGIOGRAM: SHX5508

## 2018-01-23 HISTORY — DX: Disorder of arteries and arterioles, unspecified: I77.9

## 2018-01-23 HISTORY — DX: Chronic atrial fibrillation, unspecified: I48.20

## 2018-01-23 HISTORY — DX: Peripheral vascular disease, unspecified: I73.9

## 2018-01-23 LAB — FIBRINOGEN
Fibrinogen: 356 mg/dL (ref 210–475)
Fibrinogen: 385 mg/dL (ref 210–475)

## 2018-01-23 LAB — I-STAT CHEM 8, ED
BUN: 21 mg/dL — ABNORMAL HIGH (ref 6–20)
Calcium, Ion: 1.18 mmol/L (ref 1.15–1.40)
Chloride: 104 mmol/L (ref 98–111)
Creatinine, Ser: 1 mg/dL (ref 0.61–1.24)
Glucose, Bld: 229 mg/dL — ABNORMAL HIGH (ref 70–99)
HCT: 49 % (ref 39.0–52.0)
Hemoglobin: 16.7 g/dL (ref 13.0–17.0)
Potassium: 3.9 mmol/L (ref 3.5–5.1)
Sodium: 139 mmol/L (ref 135–145)
TCO2: 25 mmol/L (ref 22–32)

## 2018-01-23 LAB — SAMPLE TO BLOOD BANK

## 2018-01-23 LAB — COMPREHENSIVE METABOLIC PANEL
ALT: 20 U/L (ref 0–44)
AST: 24 U/L (ref 15–41)
Albumin: 4.7 g/dL (ref 3.5–5.0)
Alkaline Phosphatase: 73 U/L (ref 38–126)
Anion gap: 11 (ref 5–15)
BUN: 17 mg/dL (ref 6–20)
CO2: 22 mmol/L (ref 22–32)
Calcium: 9.8 mg/dL (ref 8.9–10.3)
Chloride: 103 mmol/L (ref 98–111)
Creatinine, Ser: 1.05 mg/dL (ref 0.61–1.24)
GFR calc Af Amer: >60 mL/min (ref >60)
GFR calc non Af Amer: >60 mL/min (ref >60)
Glucose, Bld: 233 mg/dL — ABNORMAL HIGH (ref 70–99)
Potassium: 4 mmol/L (ref 3.5–5.1)
Sodium: 136 mmol/L (ref 135–145)
Total Bilirubin: 1.3 mg/dL — ABNORMAL HIGH (ref 0.3–1.2)
Total Protein: 8.3 g/dL — ABNORMAL HIGH (ref 6.5–8.1)

## 2018-01-23 LAB — CBC WITH DIFFERENTIAL/PLATELET
Abs Immature Granulocytes: 0.08 10*3/uL — ABNORMAL HIGH (ref 0.00–0.07)
Basophils Absolute: 0.1 10*3/uL (ref 0.0–0.1)
Basophils Relative: 0 %
Eosinophils Absolute: 0.2 10*3/uL (ref 0.0–0.5)
Eosinophils Relative: 2 %
HCT: 46.4 % (ref 39.0–52.0)
Hemoglobin: 15 g/dL (ref 13.0–17.0)
Immature Granulocytes: 1 %
Lymphocytes Relative: 20 %
Lymphs Abs: 2.6 10*3/uL (ref 0.7–4.0)
MCH: 29.4 pg (ref 26.0–34.0)
MCHC: 32.3 g/dL (ref 30.0–36.0)
MCV: 91 fL (ref 80.0–100.0)
Monocytes Absolute: 1.3 10*3/uL — ABNORMAL HIGH (ref 0.1–1.0)
Monocytes Relative: 10 %
Neutro Abs: 9 10*3/uL — ABNORMAL HIGH (ref 1.7–7.7)
Neutrophils Relative %: 67 %
Platelets: 160 10*3/uL (ref 150–400)
RBC: 5.1 MIL/uL (ref 4.22–5.81)
RDW: 13.5 % (ref 11.5–15.5)
WBC: 13.3 10*3/uL — ABNORMAL HIGH (ref 4.0–10.5)
nRBC: 0 % (ref 0.0–0.2)

## 2018-01-23 LAB — CBC
HCT: 43.1 % (ref 39.0–52.0)
HCT: 45.6 % (ref 39.0–52.0)
Hemoglobin: 13.6 g/dL (ref 13.0–17.0)
Hemoglobin: 14.4 g/dL (ref 13.0–17.0)
MCH: 28.5 pg (ref 26.0–34.0)
MCH: 28.9 pg (ref 26.0–34.0)
MCHC: 31.6 g/dL (ref 30.0–36.0)
MCHC: 31.6 g/dL (ref 30.0–36.0)
MCV: 90.4 fL (ref 80.0–100.0)
MCV: 91.4 fL (ref 80.0–100.0)
Platelets: 146 10*3/uL — ABNORMAL LOW (ref 150–400)
Platelets: 170 10*3/uL (ref 150–400)
RBC: 4.77 MIL/uL (ref 4.22–5.81)
RBC: 4.99 MIL/uL (ref 4.22–5.81)
RDW: 13.3 % (ref 11.5–15.5)
RDW: 13.5 % (ref 11.5–15.5)
WBC: 13.1 10*3/uL — ABNORMAL HIGH (ref 4.0–10.5)
WBC: 19.4 10*3/uL — ABNORMAL HIGH (ref 4.0–10.5)
nRBC: 0 % (ref 0.0–0.2)
nRBC: 0 % (ref 0.0–0.2)

## 2018-01-23 LAB — GLUCOSE, CAPILLARY
Glucose-Capillary: 189 mg/dL — ABNORMAL HIGH (ref 70–99)
Glucose-Capillary: 167 mg/dL — ABNORMAL HIGH (ref 70–99)
Glucose-Capillary: 231 mg/dL — ABNORMAL HIGH (ref 70–99)

## 2018-01-23 LAB — APTT: aPTT: 35 seconds (ref 24–36)

## 2018-01-23 LAB — PROTIME-INR
INR: 1.24
Prothrombin Time: 15.5 seconds — ABNORMAL HIGH (ref 11.4–15.2)

## 2018-01-23 LAB — MRSA PCR SCREENING: MRSA by PCR: NEGATIVE

## 2018-01-23 LAB — HEPARIN LEVEL (UNFRACTIONATED)
Heparin Unfractionated: 1.88 IU/mL — ABNORMAL HIGH (ref 0.30–0.70)
Heparin Unfractionated: 1.05 IU/mL — ABNORMAL HIGH (ref 0.30–0.70)

## 2018-01-23 SURGERY — ANGIOGRAM, LOWER EXTREMITY
Anesthesia: General | Laterality: Left

## 2018-01-23 MED ORDER — HEPARIN (PORCINE) 25000 UT/250ML-% IV SOLN
500.0000 [IU]/h | INTRAVENOUS | Status: DC
  Administered 2018-01-23: 500 [IU]/h via INTRAVENOUS
  Filled 2018-01-23: qty 250

## 2018-01-23 MED ORDER — HYDRALAZINE HCL 20 MG/ML IJ SOLN
10.0000 mg | INTRAMUSCULAR | Status: DC | PRN
  Administered 2018-01-23 – 2018-01-24 (×3): 10 mg via INTRAVENOUS
  Filled 2018-01-23 (×2): qty 1

## 2018-01-23 MED ORDER — HYDRALAZINE HCL 20 MG/ML IJ SOLN
INTRAMUSCULAR | Status: AC
  Administered 2018-01-23: 17:00:00
  Filled 2018-01-23: qty 1

## 2018-01-23 MED ORDER — MIDAZOLAM HCL 5 MG/5ML IJ SOLN
INTRAMUSCULAR | Status: DC | PRN
  Administered 2018-01-23: 2 mg via INTRAVENOUS

## 2018-01-23 MED ORDER — ONDANSETRON HCL 4 MG/2ML IJ SOLN
INTRAMUSCULAR | Status: DC | PRN
  Administered 2018-01-23: 4 mg via INTRAVENOUS

## 2018-01-23 MED ORDER — LABETALOL HCL 5 MG/ML IV SOLN
INTRAVENOUS | Status: AC
  Administered 2018-01-23: 17:00:00
  Filled 2018-01-23: qty 4

## 2018-01-23 MED ORDER — MIDAZOLAM HCL 2 MG/2ML IJ SOLN
1.0000 mg | INTRAMUSCULAR | Status: DC | PRN
  Administered 2018-01-23 (×2): 1 mg via INTRAVENOUS

## 2018-01-23 MED ORDER — FENTANYL CITRATE (PF) 250 MCG/5ML IJ SOLN
INTRAMUSCULAR | Status: AC
  Filled 2018-01-23: qty 5

## 2018-01-23 MED ORDER — LACTATED RINGERS IV SOLN
INTRAVENOUS | Status: DC
  Administered 2018-01-23: 11:00:00 via INTRAVENOUS

## 2018-01-23 MED ORDER — HYDRALAZINE HCL 20 MG/ML IJ SOLN
5.0000 mg | INTRAMUSCULAR | Status: AC | PRN
  Administered 2018-01-23 (×2): 5 mg via INTRAVENOUS
  Filled 2018-01-23: qty 1

## 2018-01-23 MED ORDER — HEPARIN (PORCINE) 25000 UT/250ML-% IV SOLN: 500.0000 [IU]/h | INTRAVENOUS | Status: DC

## 2018-01-23 MED ORDER — HYDROMORPHONE HCL 1 MG/ML IJ SOLN
0.2500 mg | INTRAMUSCULAR | Status: DC | PRN
  Administered 2018-01-23 (×2): 1 mg via INTRAVENOUS

## 2018-01-23 MED ORDER — SODIUM CHLORIDE 0.9% FLUSH
3.0000 mL | INTRAVENOUS | Status: DC | PRN
  Administered 2018-01-27: 3 mL via INTRAVENOUS
  Filled 2018-01-23: qty 3

## 2018-01-23 MED ORDER — SODIUM CHLORIDE 0.9 % IV SOLN
1.0000 mg/h | INTRAVENOUS | Status: DC
  Administered 2018-01-23 – 2018-01-24 (×3): 1 mg/h
  Filled 2018-01-23 (×6): qty 10

## 2018-01-23 MED ORDER — METOPROLOL TARTRATE 5 MG/5ML IV SOLN
5.0000 mg | Freq: Four times a day (QID) | INTRAVENOUS | Status: DC | PRN
  Administered 2018-01-24 – 2018-01-27 (×3): 5 mg via INTRAVENOUS
  Filled 2018-01-23 (×4): qty 5

## 2018-01-23 MED ORDER — SODIUM CHLORIDE 0.9 % IV SOLN
INTRAVENOUS | Status: DC | PRN
  Administered 2018-01-23: 13:00:00

## 2018-01-23 MED ORDER — HEPARIN SODIUM (PORCINE) 1000 UNIT/ML IJ SOLN
INTRAMUSCULAR | Status: DC | PRN
  Administered 2018-01-23: 8000 [IU] via INTRAVENOUS

## 2018-01-23 MED ORDER — LIDOCAINE HCL (PF) 1 % IJ SOLN
INTRAMUSCULAR | Status: AC
  Filled 2018-01-23: qty 30

## 2018-01-23 MED ORDER — IODIXANOL 320 MG/ML IV SOLN
INTRAVENOUS | Status: DC | PRN
  Administered 2018-01-23: 64.8 mL via INTRA_ARTERIAL

## 2018-01-23 MED ORDER — MEPERIDINE HCL 50 MG/ML IJ SOLN: 6.2500 mg | INTRAMUSCULAR | Status: DC | PRN

## 2018-01-23 MED ORDER — MIDAZOLAM HCL 2 MG/2ML IJ SOLN
INTRAMUSCULAR | Status: AC
  Filled 2018-01-23: qty 2

## 2018-01-23 MED ORDER — LABETALOL HCL 5 MG/ML IV SOLN
INTRAVENOUS | Status: AC
  Administered 2018-01-23: 15:00:00
  Filled 2018-01-23: qty 4

## 2018-01-23 MED ORDER — MORPHINE SULFATE (PF) 4 MG/ML IV SOLN
5.0000 mg | INTRAVENOUS | Status: DC | PRN
  Administered 2018-01-23 – 2018-01-24 (×2): 5 mg via INTRAVENOUS
  Filled 2018-01-23 (×2): qty 2

## 2018-01-23 MED ORDER — PROPOFOL 10 MG/ML IV BOLUS
INTRAVENOUS | Status: DC | PRN
  Administered 2018-01-23: 130 mg via INTRAVENOUS

## 2018-01-23 MED ORDER — MIDAZOLAM HCL 2 MG/2ML IJ SOLN
INTRAMUSCULAR | Status: AC
  Administered 2018-01-23: 15:00:00
  Filled 2018-01-23: qty 2

## 2018-01-23 MED ORDER — HEPARIN (PORCINE) 25000 UT/250ML-% IV SOLN
500.0000 [IU]/h | INTRAVENOUS | Status: DC
  Administered 2018-01-23: 500 [IU]/h via INTRAVENOUS

## 2018-01-23 MED ORDER — METOPROLOL TARTRATE 25 MG PO TABS
25.0000 mg | ORAL_TABLET | Freq: Two times a day (BID) | ORAL | Status: DC
  Administered 2018-01-23 – 2018-01-24 (×2): 25 mg via ORAL
  Filled 2018-01-23 (×2): qty 1

## 2018-01-23 MED ORDER — HYDROMORPHONE HCL 1 MG/ML IJ SOLN
INTRAMUSCULAR | Status: AC
  Administered 2018-01-23: 16:00:00
  Filled 2018-01-23: qty 1

## 2018-01-23 MED ORDER — PHENYLEPHRINE 40 MCG/ML (10ML) SYRINGE FOR IV PUSH (FOR BLOOD PRESSURE SUPPORT)
PREFILLED_SYRINGE | INTRAVENOUS | Status: DC | PRN
  Administered 2018-01-23: 240 ug via INTRAVENOUS
  Administered 2018-01-23: 80 ug via INTRAVENOUS

## 2018-01-23 MED ORDER — LIDOCAINE 2% (20 MG/ML) 5 ML SYRINGE
INTRAMUSCULAR | Status: DC | PRN
  Administered 2018-01-23: 100 mg via INTRAVENOUS

## 2018-01-23 MED ORDER — HYDROMORPHONE HCL 1 MG/ML IJ SOLN
INTRAMUSCULAR | Status: AC
  Administered 2018-01-23: 15:00:00
  Filled 2018-01-23: qty 1

## 2018-01-23 MED ORDER — SODIUM CHLORIDE 0.9 % IV SOLN
INTRAVENOUS | Status: DC | PRN
  Administered 2018-01-23: 50 ug/min via INTRAVENOUS

## 2018-01-23 MED ORDER — HEPARIN SODIUM (PORCINE) 5000 UNIT/ML IJ SOLN
INTRAMUSCULAR | Status: AC
  Filled 2018-01-23: qty 1.2

## 2018-01-23 MED ORDER — SODIUM CHLORIDE 0.9 % IV SOLN: 250.0000 mL | INTRAVENOUS | Status: DC | PRN

## 2018-01-23 MED ORDER — LEVOTHYROXINE SODIUM 50 MCG PO TABS: 50.0000 ug | ORAL_TABLET | Freq: Every day | ORAL | Status: DC

## 2018-01-23 MED ORDER — SODIUM CHLORIDE 0.9 % IV SOLN
0.2500 mg/h | INTRAVENOUS | Status: DC
  Administered 2018-01-23: 1 mg/h
  Filled 2018-01-23: qty 10

## 2018-01-23 MED ORDER — ONDANSETRON HCL 4 MG/2ML IJ SOLN: 4.0000 mg | Freq: Once | INTRAMUSCULAR | Status: DC | PRN

## 2018-01-23 MED ORDER — SODIUM CHLORIDE 0.9 % IV SOLN
INTRAVENOUS | Status: DC
  Administered 2018-01-23 – 2018-01-28 (×7): via INTRAVENOUS

## 2018-01-23 MED ORDER — HYDROMORPHONE HCL 1 MG/ML IJ SOLN
INTRAMUSCULAR | Status: AC
  Administered 2018-01-23: 17:00:00
  Filled 2018-01-23: qty 1

## 2018-01-23 MED ORDER — ONDANSETRON HCL 4 MG/2ML IJ SOLN
4.0000 mg | Freq: Four times a day (QID) | INTRAMUSCULAR | Status: DC | PRN
  Administered 2018-01-23 – 2018-01-28 (×8): 4 mg via INTRAVENOUS
  Filled 2018-01-23 (×8): qty 2

## 2018-01-23 MED ORDER — FENTANYL CITRATE (PF) 250 MCG/5ML IJ SOLN
INTRAMUSCULAR | Status: DC | PRN
  Administered 2018-01-23: 50 ug via INTRAVENOUS
  Administered 2018-01-23 (×3): 25 ug via INTRAVENOUS
  Administered 2018-01-23: 50 ug via INTRAVENOUS
  Administered 2018-01-23: 25 ug via INTRAVENOUS
  Administered 2018-01-23: 50 ug via INTRAVENOUS

## 2018-01-23 MED ORDER — LABETALOL HCL 5 MG/ML IV SOLN
10.0000 mg | INTRAVENOUS | Status: AC | PRN
  Administered 2018-01-23 (×4): 10 mg via INTRAVENOUS
  Filled 2018-01-23: qty 4

## 2018-01-23 MED ORDER — SODIUM CHLORIDE 0.9 % IV SOLN: INTRAVENOUS | Status: DC

## 2018-01-23 MED ORDER — METOPROLOL TARTRATE 5 MG/5ML IV SOLN
5.0000 mg | Freq: Once | INTRAVENOUS | Status: AC
  Administered 2018-01-23: 5 mg via INTRAVENOUS
  Filled 2018-01-23: qty 5

## 2018-01-23 MED ORDER — SODIUM CHLORIDE 0.9% FLUSH
3.0000 mL | Freq: Two times a day (BID) | INTRAVENOUS | Status: DC
  Administered 2018-01-23 – 2018-01-26 (×6): 3 mL via INTRAVENOUS

## 2018-01-23 MED ORDER — CEFAZOLIN SODIUM-DEXTROSE 2-3 GM-%(50ML) IV SOLR
INTRAVENOUS | Status: DC | PRN
  Administered 2018-01-23: 2 g via INTRAVENOUS

## 2018-01-23 MED ORDER — BENAZEPRIL HCL 10 MG PO TABS
10.0000 mg | ORAL_TABLET | Freq: Every day | ORAL | Status: DC
  Administered 2018-01-23 – 2018-01-24 (×2): 10 mg via ORAL
  Filled 2018-01-23 (×2): qty 1

## 2018-01-23 MED ORDER — 0.9 % SODIUM CHLORIDE (POUR BTL) OPTIME
TOPICAL | Status: DC | PRN
  Administered 2018-01-23: 1000 mL

## 2018-01-23 SURGICAL SUPPLY — 92 items
BAG BANDED W/RUBBER/TAPE 36X54 (MISCELLANEOUS) IMPLANT
BAG ISOLATION DRAPE 18X18 (DRAPES) ×2 IMPLANT
BAG SNAP BAND KOVER 36X36 (MISCELLANEOUS) ×3 IMPLANT
BANDAGE ESMARK 6X9 LF (GAUZE/BANDAGES/DRESSINGS) IMPLANT
BNDG ESMARK 6X9 LF (GAUZE/BANDAGES/DRESSINGS)
CANISTER SUCT 3000ML PPV (MISCELLANEOUS) IMPLANT
CATH ANGIO 5F BER2 65CM (CATHETERS) IMPLANT
CATH INFUS 135CMX50CM (CATHETERS) ×3 IMPLANT
CATH OMNI FLUSH 5F 65CM (CATHETERS) ×3 IMPLANT
CATH QUICKCROSS .035X135CM (MICROCATHETER) ×3 IMPLANT
CATH QUICKCROSS SUPP .035X90CM (MICROCATHETER) ×3 IMPLANT
CHLORAPREP W/TINT 26ML (MISCELLANEOUS) IMPLANT
CLIP VESOCCLUDE MED 24/CT (CLIP) IMPLANT
CLIP VESOCCLUDE SM WIDE 24/CT (CLIP) IMPLANT
COVER DOME SNAP 22 D (MISCELLANEOUS) ×3 IMPLANT
COVER PROBE W GEL 5X96 (DRAPES) ×3 IMPLANT
COVER SURGICAL LIGHT HANDLE (MISCELLANEOUS) ×3 IMPLANT
COVER WAND RF STERILE (DRAPES) ×3 IMPLANT
CUFF TOURNIQUET SINGLE 24IN (TOURNIQUET CUFF) IMPLANT
CUFF TOURNIQUET SINGLE 34IN LL (TOURNIQUET CUFF) IMPLANT
CUFF TOURNIQUET SINGLE 44IN (TOURNIQUET CUFF) IMPLANT
DERMABOND ADHESIVE PROPEN (GAUZE/BANDAGES/DRESSINGS) ×1
DERMABOND ADVANCED .7 DNX6 (GAUZE/BANDAGES/DRESSINGS) ×2 IMPLANT
DEVICE TORQUE KENDALL .025-038 (MISCELLANEOUS) ×3 IMPLANT
DRAIN CHANNEL 15F RND FF W/TCR (WOUND CARE) IMPLANT
DRAPE C-ARM 42X72 X-RAY (DRAPES) IMPLANT
DRAPE FEMORAL ANGIO 80X135IN (DRAPES) IMPLANT
DRAPE INCISE IOBAN 66X45 STRL (DRAPES) ×3 IMPLANT
DRAPE ISOLATION BAG 18X18 (DRAPES) ×1
DRSG TEGADERM 4X4.75 (GAUZE/BANDAGES/DRESSINGS) ×3 IMPLANT
ELECT REM PT RETURN 9FT ADLT (ELECTROSURGICAL) ×3
ELECTRODE REM PT RTRN 9FT ADLT (ELECTROSURGICAL) ×2 IMPLANT
EVACUATOR SILICONE 100CC (DRAIN) IMPLANT
GAUZE 4X4 16PLY RFD (DISPOSABLE) ×3 IMPLANT
GLIDEWIRE ADV .035X260CM (WIRE) ×3 IMPLANT
GLOVE BIO SURGEON STRL SZ 6.5 (GLOVE) ×3 IMPLANT
GLOVE BIO SURGEON STRL SZ7 (GLOVE) ×3 IMPLANT
GLOVE BIO SURGEON STRL SZ7.5 (GLOVE) ×6 IMPLANT
GLOVE BIOGEL PI IND STRL 7.0 (GLOVE) ×6 IMPLANT
GLOVE BIOGEL PI IND STRL 8 (GLOVE) ×2 IMPLANT
GLOVE BIOGEL PI INDICATOR 7.0 (GLOVE) ×3
GLOVE BIOGEL PI INDICATOR 8 (GLOVE) ×1
GLOVE SURG SS PI 6.0 STRL IVOR (GLOVE) ×3 IMPLANT
GOWN STRL REUS W/ TWL LRG LVL3 (GOWN DISPOSABLE) ×6 IMPLANT
GOWN STRL REUS W/ TWL XL LVL3 (GOWN DISPOSABLE) ×2 IMPLANT
GOWN STRL REUS W/TWL LRG LVL3 (GOWN DISPOSABLE) ×3
GOWN STRL REUS W/TWL XL LVL3 (GOWN DISPOSABLE) ×1
GUIDEWIRE ANGLED .035X150CM (WIRE) IMPLANT
HEMOSTAT SPONGE AVITENE ULTRA (HEMOSTASIS) IMPLANT
INSERT FOGARTY SM (MISCELLANEOUS) IMPLANT
KIT BASIN OR (CUSTOM PROCEDURE TRAY) ×3 IMPLANT
KIT TURNOVER KIT B (KITS) ×3 IMPLANT
NEEDLE PERC 18GX7CM (NEEDLE) IMPLANT
NS IRRIG 1000ML POUR BTL (IV SOLUTION) ×3 IMPLANT
PACK PERIPHERAL VASCULAR (CUSTOM PROCEDURE TRAY) IMPLANT
PACK SURGICAL SETUP 50X90 (CUSTOM PROCEDURE TRAY) IMPLANT
PACK UNIVERSAL I (CUSTOM PROCEDURE TRAY) ×3 IMPLANT
PAD ARMBOARD 7.5X6 YLW CONV (MISCELLANEOUS) ×6 IMPLANT
PROTECTION STATION PRESSURIZED (MISCELLANEOUS) ×3
SET MICROPUNCTURE 5F STIFF (MISCELLANEOUS) ×3 IMPLANT
SHEATH AVANTI 11CM 5FR (SHEATH) IMPLANT
SHEATH PINNACLE 5F 10CM (SHEATH) ×3 IMPLANT
SHEATH PINNACLE ST 7F 45CM (SHEATH) ×6 IMPLANT
STAPLER VISISTAT 35W (STAPLE) IMPLANT
STATION PROTECTION PRESSURIZED (MISCELLANEOUS) ×2 IMPLANT
STOPCOCK 4 WAY LG BORE MALE ST (IV SETS) IMPLANT
STOPCOCK MORSE 400PSI 3WAY (MISCELLANEOUS) ×3 IMPLANT
SUT ETHILON 3 0 PS 1 (SUTURE) IMPLANT
SUT GORETEX 5 0 TT13 24 (SUTURE) IMPLANT
SUT GORETEX 6.0 TT13 (SUTURE) IMPLANT
SUT MNCRL AB 4-0 PS2 18 (SUTURE) IMPLANT
SUT PROLENE 5 0 C 1 24 (SUTURE) IMPLANT
SUT PROLENE 6 0 BV (SUTURE) IMPLANT
SUT PROLENE 7 0 BV 1 (SUTURE) IMPLANT
SUT SILK 2 0 PERMA HAND 18 BK (SUTURE) ×3 IMPLANT
SUT SILK 3 0 (SUTURE)
SUT SILK 3-0 18XBRD TIE 12 (SUTURE) IMPLANT
SUT VIC AB 2-0 CT1 27 (SUTURE)
SUT VIC AB 2-0 CT1 TAPERPNT 27 (SUTURE) IMPLANT
SUT VIC AB 3-0 SH 27 (SUTURE)
SUT VIC AB 3-0 SH 27X BRD (SUTURE) IMPLANT
SYR 10ML LL (SYRINGE) ×12 IMPLANT
SYR 20CC LL (SYRINGE) ×6 IMPLANT
SYR MEDRAD MARK V 150ML (SYRINGE) IMPLANT
SYRINGE 60CC LL (MISCELLANEOUS) ×3 IMPLANT
TOWEL GREEN STERILE (TOWEL DISPOSABLE) ×6 IMPLANT
TRAY FOLEY MTR SLVR 16FR STAT (SET/KITS/TRAYS/PACK) IMPLANT
TUBING EXTENTION W/L.L. (IV SETS) IMPLANT
TUBING HIGH PRESSURE 120CM (CONNECTOR) ×3 IMPLANT
UNDERPAD 30X30 (UNDERPADS AND DIAPERS) ×3 IMPLANT
WATER STERILE IRR 1000ML POUR (IV SOLUTION) IMPLANT
WIRE BENTSON .035X145CM (WIRE) ×3 IMPLANT

## 2018-01-23 NOTE — Anesthesia Preprocedure Evaluation (Signed)
Anesthesia Evaluation  Patient identified by MRN, date of birth, ID band Patient awake    Reviewed: Allergy & Precautions, NPO status , Patient's Chart, lab work & pertinent test results  Airway Mallampati: I  TM Distance: >3 FB Neck ROM: Full    Dental   Pulmonary Current Smoker,    Pulmonary exam normal        Cardiovascular hypertension, Pt. on medications + CAD  Normal cardiovascular exam+ dysrhythmias Atrial Fibrillation      Neuro/Psych    GI/Hepatic   Endo/Other  diabetes, Type 2, Insulin Dependent  Renal/GU      Musculoskeletal   Abdominal   Peds  Hematology   Anesthesia Other Findings   Reproductive/Obstetrics                             Anesthesia Physical Anesthesia Plan  ASA: III  Anesthesia Plan: General   Post-op Pain Management:    Induction: Intravenous  PONV Risk Score and Plan: 1 and Ondansetron and Treatment may vary due to age or medical condition  Airway Management Planned: LMA  Additional Equipment:   Intra-op Plan:   Post-operative Plan: Extubation in OR  Informed Consent: I have reviewed the patients History and Physical, chart, labs and discussed the procedure including the risks, benefits and alternatives for the proposed anesthesia with the patient or authorized representative who has indicated his/her understanding and acceptance.     Plan Discussed with: CRNA and Surgeon  Anesthesia Plan Comments:         Anesthesia Quick Evaluation

## 2018-01-23 NOTE — Anesthesia Postprocedure Evaluation (Signed)
Anesthesia Post Note  Patient: Willie Morales  Procedure(s) Performed: LEFT LOWER EXTREMITY ANGIOGRAM, THROMBOLYSIS Catheter Placement (Left ) ABDOMINAL AORTAGRAM     Patient location during evaluation: PACU Anesthesia Type: General Level of consciousness: awake and alert Pain management: pain level controlled Vital Signs Assessment: post-procedure vital signs reviewed and stable Respiratory status: spontaneous breathing, nonlabored ventilation, respiratory function stable and patient connected to nasal cannula oxygen Cardiovascular status: blood pressure returned to baseline and stable Postop Assessment: no apparent nausea or vomiting Anesthetic complications: no    Last Vitals:  Vitals:   01/23/18 1749 01/23/18 1800  BP: (!) 181/90 129/78  Pulse:  (!) 128  Resp:  17  Temp:    SpO2:  95%    Last Pain:  Vitals:   01/23/18 1738  TempSrc: Oral  PainSc: 10-Worst pain ever    LLE Motor Response: (P) Purposeful movement (01/23/18 1800) LLE Sensation: (P) Pain;Numbness (01/23/18 1800) RLE Motor Response: (P) Purposeful movement (01/23/18 1800) RLE Sensation: (P) Full sensation;No numbness;No pain;No tingling (01/23/18 1800)      Jermery Caratachea DAVID

## 2018-01-23 NOTE — Progress Notes (Signed)
Dr Sarina Illlark/vasc in for re-eval.Confirms there are no signals in LLE, now or earlier. ? Occlusion by catheter as well as disease/grafts. Patient demonstrates  He is able to wiggle toes/foot. Infusions to continue

## 2018-01-23 NOTE — Op Note (Signed)
Patient name: EWAN GRAU MRN: [id number] DOB: [birth year] Sex: male  01/23/2018 Pre-operative Diagnosis: Acute on chronic limb ischemia of the left lower extremity with thrombosed common femoral to below-knee popliteal prosthetic bypass Post-operative diagnosis:  Same Surgeon:  Cephus Shelling, MD Procedure Performed: 1.  Ultrasound-guided access of the right common femoral artery 2.  Aortogram 3.  Left lower extremity arteriogram with selection of third order branches 4.  Thrombolytics catheter placement in the left common femoral to below-knee popliteal prosthetic bypass graft 5.  Pharmacologic catheter thrombolysis of the left lower extremity (Unifuse catheter)  Indications: Patient is a 57 year old male who has undergone multiple left lower extremity interventions including a previous left common femoral to below-knee popliteal bypass with prosthetic graft in early 2019.  He additionally required left iliac intervention to treat his inflow disease.  Most recently in July 2019 he thrombosed his bypass and required cutdown on the popliteal artery with thrombectomy and patch angioplasty of the distal graft onto the tibioperoneal trunk.  He presented to the emergency room today with signs of acute limb ischemia of his left lower extremity with ongoing symptoms for 3 days.  Given the complexity of his previous interventions with a preserved motor exam we recommended attempted thrombolysis of the left lower extremity after risks and benefits were discussed.  Findings: Aortogram revealed widely patent infrarenal aorta and a widely patent right common and external iliac artery.  Patient had multilevel high-grade stenosis >75% of the left external iliac artery that was the inflow to the bypass.  Ultimately a long 7 [place] destination sheath was used to cross the aortic bifurcation from the right groin and was placed into the left common femoral artery.  We selected the femoral to below-knee  popliteal bypass which was thrombosed and ultimately placed a UniFuse thrombolytics catheter into the bypass and the catheter was placed into the native below-knee popliteal artery in its distal extent.  Initial left lower extremity arteriogram imaging revealed only a patent common femoral and profunda artery although the profunda had a high-grade proximal stenosis.  There was no evidence of any distal runoff on initial imaging.  Once we were able to get a catheter across his occluded bypass with brief hand-injection through a quick cross catheter we did see filling of tibial vessels and it appeared that the posterior tibial was his dominant runoff.  He will be taken to the ICU tonight for thrombolysis with plans for thrombotic catheter check tomorrow.  Anesthesia: LMA   Procedure:  The patient was identified in the holding area and taken to hybrid OR 16.  The patient was then placed supine on the table and prepped and draped in the usual sterile fashion.  A time out was called.  Ultrasound was used to evaluate the right common femoral artery.  It was patent .  A digital ultrasound image was acquired.  A micropuncture needle was used to access the right common femoral artery under ultrasound guidance.  An 018 wire was advanced without resistance and a micropuncture sheath was placed.  The 018 wire was removed and a benson wire was placed.  The micropuncture sheath was exchanged for a 5 french sheath.  An omniflush catheter was advanced over the wire to the level of L-1.  An abdominal angiogram was obtained.  Next, using the omniflush catheter and a benson wire, the aortic bifurcation was crossed and the catheter was placed into theleft external iliac artery and left runoff was obtained.  As noted above  patient had multilevel high-grade greater than 75% stenosis of the left external iliac and his left common femoral to below-knee popliteal bypass was occluded with no evidence of initial runoff in the left lower  extremity.  Given the decision to perform thrombolysis we exchanged for a Glidewire advantage across the aortic bifurcation into the profunda artery using an Omni Flush catheter.  I then upsized to a 7 [place] long destination sheath in the right groin.  Patient was given 8000 units of IV heparin at that time.  I then used a quick cross catheter and Glidewire advantage to select his bypass which was occluded and our wire easily traversed this occluded bypass given its acute nature.  I was able to get into his native below-knee popliteal artery where I did perform a brief hand-injection through a quick cross catheter to confirm I was in the true lumen.  Ultimately this was exchanged for a long UniFuse catheter through our sheath and with UniFuse catheter was parked through the bypass into the native below-knee popliteal artery.  We will take him to the ICU for thrombolysis overnight with alteplase at 1 mg/h with heparin running at 500 units an hour through the sheath.  Plan to return tomorrow for thrombolytics check and possible intervention.  The right groin sheath was secured with a 3-0 silk and both the sheath and cathter were secured with sterile dressings.  Condition: Stable  Complications: None  Cephus Shellinghristopher J. Maryanna Stuber, MD [facility] [place] Office: [phone] Pager: [phone]  Cephus Shellinghristopher J Dong Nimmons

## 2018-01-23 NOTE — Transfer of Care (Signed)
Immediate Anesthesia Transfer of Care Note  Patient: Willie Morales  Procedure(s) Performed: LEFT LOWER EXTREMITY ANGIOGRAM, THROMBOLYSIS Catheter Placement (Left ) ABDOMINAL AORTAGRAM  Patient Location: PACU  Anesthesia Type:General  Level of Consciousness: awake and alert   Airway & Oxygen Therapy: Patient Spontanous Breathing and Patient connected to nasal cannula oxygen  Post-op Assessment: Report given to RN and Post -op Vital signs reviewed and stable  Post vital signs: Reviewed and stable  Last Vitals:  Vitals Value Taken Time  BP 175/109 01/23/2018  2:16 PM  Temp    Pulse 71 01/23/2018  2:20 PM  Resp 18 01/23/2018  2:20 PM  SpO2 99 % 01/23/2018  2:20 PM  Vitals shown include unvalidated device data.  Last Pain:  Vitals:   01/23/18 0838  TempSrc: Oral  PainSc: 10-Worst pain ever         Complications: No apparent anesthesia complications

## 2018-01-23 NOTE — ED Provider Notes (Signed)
[facility] EMERGENCY DEPARTMENT Provider Note   CSN: [id number] Arrival date & time:        History   Chief Complaint Chief Complaint  Patient presents with  . Leg Pain    HPI RILEN SHUKLA is a 57 y.o. male.  HPI Patient presents with left lower leg pain.  Began 2 to 3 days ago.  Has a history of ischemic disease in this leg.  Is on Eliquis and Plavix but is not had them today.  States pain is been more severe over the last 2 days and is occurring at rest.  States he can really not walk on it.  It is similar to pain he has had before.  Has seen Dr. Durwin Nora who is done numerous operations on the leg.  No fevers.  No trauma.  Leg pain is worse if it is hanging down.  He has not eaten today. Past Medical History:  Diagnosis Date  . Coronary artery disease   . Diabetes mellitus   . Dyslipidemia   . History of kidney stones   . Hypertension   . Paroxysmal atrial fibrillation (HCC) 12/2010  . Tobacco abuse     Patient Active Problem List   Diagnosis Date Noted  . Pressure injury of skin 09/18/2017  . Ischemic leg 09/17/2017  . PAD (peripheral artery disease) (HCC) 05/09/2017  . Atherosclerosis of artery of extremity with rest pain (HCC)   . Preoperative cardiovascular examination   . Leukocytosis 12/10/2016  . Paroxysmal atrial fibrillation (HCC)   . Coronary artery disease     Past Surgical History:  Procedure Laterality Date  . ABDOMINAL AORTOGRAM W/LOWER EXTREMITY N/A 07/05/2016   Procedure: Abdominal Aortogram w/Lower Extremity;  Surgeon: Fransisco Hertz, MD;  Location: [facility] INVASIVE CV LAB;  Service: Cardiovascular;  Laterality: N/A;  . ABDOMINAL AORTOGRAM W/LOWER EXTREMITY N/A 05/06/2017   Procedure: ABDOMINAL AORTOGRAM W/LOWER EXTREMITY;  Surgeon: Maeola Harman, MD;  Location: [facility] INVASIVE CV LAB;  Service: Cardiovascular;  Laterality: N/A;  . BACK SURGERY  1991  . CARDIAC CATHETERIZATION  07/31/2007   This showed nonobstructive coronary  artery disease   He had 30% lesion in the mid RCA, 30% lesion in the PDA, 45% lesion in the LAD and mid  LAD, and 30% lesion in the diagonal.  The left circumflex was normal.  The LV function was 60%.     Marland Kitchen ENDARTERECTOMY FEMORAL Left 05/09/2017   Procedure: ENDARTERECTOMY FEMORAL WITH DACRON PATCH ANGIOPLASTY;  Surgeon: Fransisco Hertz, MD;  Location: [facility] OR;  Service: Vascular;  Laterality: Left;  . ENDARTERECTOMY POPLITEAL  09/17/2017   Procedure: ENDARTERECTOMY POPLITEAL ARTERY AND TIBIAL FIBULAR TRUNK;  Surgeon: Chuck Hint, MD;  Location: [facility] OR;  Service: Vascular;;  . FEMORAL-POPLITEAL BYPASS GRAFT Left 05/09/2017   Procedure: BYPASS GRAFT FEMORAL-POPLITEAL ARTERY LEFT;  Surgeon: Fransisco Hertz, MD;  Location: [facility] OR;  Service: Vascular;  Laterality: Left;  . FEMORAL-POPLITEAL BYPASS GRAFT Left 09/17/2017   Procedure: THROMBECTOMY LEFT FEMORAL-POPLITEAL BYPASS GRAFT;  Surgeon: Chuck Hint, MD;  Location: [facility] OR;  Service: Vascular;  Laterality: Left;  . KIDNEY STONE SURGERY  2011  . LOWER EXTREMITY ANGIOGRAPHY N/A 12/06/2016   Procedure: Lower Extremity Angiography;  Surgeon: Fransisco Hertz, MD;  Location: [facility] INVASIVE CV LAB;  Service: Cardiovascular;  Laterality: N/A;  . LOWER EXTREMITY ANGIOGRAPHY N/A 09/05/2017   Procedure: LOWER EXTREMITY ANGIOGRAPHY;  Surgeon: Fransisco Hertz, MD;  Location: [facility] INVASIVE CV LAB;  Service: Cardiovascular;  Laterality: N/A;  . PATCH ANGIOPLASTY Left 05/09/2017   Procedure: PATCH ANGIOPLASTY WITH PROPATEN GRAFT  OF BELOW KNEE POPLITEAL ARTERY;  Surgeon: Fransisco Hertz, MD;  Location: [facility] OR;  Service: Vascular;  Laterality: Left;  . PATCH ANGIOPLASTY Left 09/17/2017   Procedure: PATCH ANGIOPLASTY WITH PERICARDIAL BOVINE PATCH OF DISTAL ANASTOMOSIS;  Surgeon: Chuck Hint, MD;  Location: [facility] OR;  Service: Vascular;  Laterality: Left;  . PERIPHERAL VASCULAR ATHERECTOMY Right 07/19/2016   Procedure: Peripheral Vascular Atherectomy;  Surgeon: Fransisco Hertz, MD;  Location: [facility] INVASIVE CV LAB;  Service: Cardiovascular;  Laterality: Right;  . PERIPHERAL VASCULAR BALLOON ANGIOPLASTY Left 07/05/2016   Procedure: Peripheral Vascular Balloon Angioplasty;  Surgeon: Fransisco Hertz, MD;  Location: [facility] INVASIVE CV LAB;  Service: Cardiovascular;  Laterality: Left;  SFA  . PERIPHERAL VASCULAR BALLOON ANGIOPLASTY Left 09/05/2017   Procedure: PERIPHERAL VASCULAR BALLOON ANGIOPLASTY;  Surgeon: Fransisco Hertz, MD;  Location: [facility] INVASIVE CV LAB;  Service: Cardiovascular;  Laterality: Left;  Popliteal, profunda femoral, external iliac  . PERIPHERAL VASCULAR INTERVENTION  12/06/2016   Procedure: PERIPHERAL VASCULAR INTERVENTION;  Surgeon: Fransisco Hertz, MD;  Location: [facility] INVASIVE CV LAB;  Service: Cardiovascular;;  LEFT        Home Medications    Prior to Admission medications   Medication Sig Start Date End Date Taking? Authorizing Provider  apixaban (ELIQUIS) 5 MG TABS tablet Take 1 tablet (5 mg total) by mouth 2 (two) times daily. 03/26/16   Jodelle Gross, NP  atorvastatin (LIPITOR) 10 MG tablet Take 10 mg by mouth daily.  11/12/16   [provider]  benazepril (LOTENSIN) 10 MG tablet Take 1 tablet (10 mg total) by mouth daily. 04/03/16   Jodelle Gross, NP  clopidogrel (PLAVIX) 75 MG tablet Take 1 tablet (75 mg total) by mouth daily. 09/20/17   Azalee Course, PA  gabapentin (NEURONTIN) 300 MG capsule Take 1 capsule (300 mg total) by mouth 3 (three) times daily. 10/07/17   Nada Libman, MD  glimepiride (AMARYL) 4 MG tablet Take 1 tablet (4 mg total) by mouth daily with breakfast. 03/19/16   Jodelle Gross, NP  HYDROcodone-acetaminophen (NORCO/VICODIN) 5-325 MG tablet Take 1 tablet by mouth every 6 (six) hours as needed for moderate pain. 05/29/17   Lars Mage, PA-C  levothyroxine (SYNTHROID, LEVOTHROID) 50 MCG tablet Take 25 mcg by mouth daily before breakfast.     [provider]  metoprolol tartrate (LOPRESSOR) 25 MG tablet  TAKE 1 TABLET BY MOUTH TWICE DAILY 01/22/18   Iran Ouch, MD  sitaGLIPtin-metformin (JANUMET) 50-1000 MG tablet Take 1 tablet by mouth 2 (two) times daily.    [provider]  varenicline (CHANTIX CONTINUING MONTH PAK) 1 MG tablet Take 1 tablet (1 mg total) by mouth 2 (two) times daily. 09/25/17   Fransisco Hertz, MD  varenicline (CHANTIX) 0.5 MG tablet Take 0.5 mg by mouth daily for the first three days, then Take 0.5 mg by mouth twice a day for the next four days. 09/25/17   Fransisco Hertz, MD    Family History Family History  Problem Relation Age of Onset  . Coronary artery disease Father        CABG in early 47's  . Heart attack Father   . Heart disease Father   . Cancer Mother   . Heart disease Mother   . Heart disease Brother   . Diabetes Maternal Grandmother   . SIDS Sister  Social History Social History   Tobacco Use  . Smoking status: Current Every Day Smoker    Packs/day: 1.00    Years: 35.00    Pack years: 35.00    Types: Cigarettes  . Smokeless tobacco: Never Used  . Tobacco comment: 2-3 cigarettes per day  Substance Use Topics  . Alcohol use: No  . Drug use: No     Allergies   Ciprofloxacin   Review of Systems Review of Systems  Constitutional: Negative for appetite change.  Respiratory: Negative for shortness of breath.   Cardiovascular: Negative for chest pain and leg swelling.  Gastrointestinal: Negative for abdominal pain.  Genitourinary: Negative for flank pain.  Musculoskeletal: Negative for back pain.       Left lower leg pain.  Neurological: Positive for numbness. Negative for weakness.  Hematological: Negative for adenopathy.     Physical Exam Updated Vital Signs BP (!) 141/97   Pulse 99   Temp 98.1 F (36.7 C) (Oral)   Resp 19   SpO2 100%   Physical Exam  Constitutional: He appears well-developed.  HENT:  Head: Atraumatic.  Neck: Neck supple.  Cardiovascular: Normal rate.  Pulmonary/Chest: Effort normal.    Abdominal: There is no tenderness.  Musculoskeletal: He exhibits tenderness.  Tenderness to left lower leg.  Popliteal pulse dopplerable on left but no dorsalis pedis or posterior tibial.  Delayed capillary refill.  Scars around knee of previous vascular surgeries.  Has strong femoral pulse.  Neurological: He is alert.  Skin: Skin is warm. Capillary refill takes less than 2 seconds.     ED Treatments / Results  Labs (all labs ordered are listed, but only abnormal results are displayed) Labs Reviewed  CBC WITH DIFFERENTIAL/PLATELET - Abnormal; Notable for the following components:      Result Value   WBC 13.3 (*)    Neutro Abs 9.0 (*)    Monocytes Absolute 1.3 (*)    Abs Immature Granulocytes 0.08 (*)    All other components within normal limits  PROTIME-INR - Abnormal; Notable for the following components:   Prothrombin Time 15.5 (*)    All other components within normal limits  I-STAT CHEM 8, ED - Abnormal; Notable for the following components:   BUN 21 (*)    Glucose, Bld 229 (*)    All other components within normal limits  COMPREHENSIVE METABOLIC PANEL  SAMPLE TO BLOOD BANK    EKG EKG Interpretation  Date/Time:  Thursday January 23 2018 09:21:43 EST Ventricular Rate:  87 PR Interval:    QRS Duration: 87 QT Interval:  360 QTC Calculation: 410 R Axis:   82 Text Interpretation:  Atrial fibrillation Confirmed by Benjiman Core [id number]) on 01/23/2018 9:59:33 AM   Radiology No results found.  Procedures Procedures (including critical care time)  Medications Ordered in ED Medications - No data to display   Initial Impression / Assessment and Plan / ED Course  I have reviewed the triage vital signs and the nursing notes.  Pertinent labs & imaging results that were available during my care of the patient were reviewed by me and considered in my medical decision making (see chart for details).     Patient with ischemic lower leg.  History of vascular disease.   Seen by vascular surgery and taken emergently to the OR.  CRITICAL CARE Performed by: Benjiman Core Total critical care time: 30 minutes Critical care time was exclusive of separately billable procedures and treating other patients. Critical care was necessary to treat  or prevent imminent or life-threatening deterioration. Critical care was time spent personally by me on the following activities: development of treatment plan with patient and/or surrogate as well as nursing, discussions with consultants, evaluation of patient's response to treatment, examination of patient, obtaining history from patient or surrogate, ordering and performing treatments and interventions, ordering and review of laboratory studies, ordering and review of radiographic studies, pulse oximetry and re-evaluation of patient's condition.   Final Clinical Impressions(s) / ED Diagnoses   Final diagnoses:  Ischemia of left lower extremity  PAD (peripheral artery disease) [facility](HCC)    ED Discharge Orders    None       Benjiman CorePickering, Dong Nimmons, MD 01/23/18 1016

## 2018-01-23 NOTE — ED Triage Notes (Signed)
Pt presents for evaluation of left lower leg pain x 2-3 days. Reports pain has worsened over past few days, was taking morphine for pain at home. Pt is on elliquis. Hx of significant vascular surgery.

## 2018-01-23 NOTE — Consult Note (Signed)
Hospital Consult    Reason for Consult: Acute on chronic limb ischemia of the left lower extremity in the setting of remote femoral to BK pop prosthetic bypass Referring Physician: D MRN #:  [id number]  History of Present Illness: This is a 57 y.o. male with history of diabetes, hyperlipidemia, hypertension, A. Fib (on eliquis), tobacco abuse that presents with left foot pain that started 3 days ago.  Patient states he initially had pain that progressed to numbness over the last 3 days in his left foot.  He suspects that his bypass is occluded because this feels the same as his most recent bypass occlusion in July of this year.  He previous underwent a left fem to below-knee popliteal bypass with PTFE by Dr. Imogene Burn in March 2019.  This thrombosed and ultimately underwent below-knee pop cutdown with graft thrombectomy and patch angioplasty of the popliteal and TP trunk by Dr. Edilia Bo as recently as July 2019.  Patient was seen in follow-up in September and was doing well with no claudication symptoms and his bypass was patent with triphasic signals although had severely reduced velocities.  He is on Eliquis and states his last dose was last night.  He continues to smoke and is smoking 3 to 4 cigarettes a day.  In reviewing his records he does have evidence of previous inflow disease and has had a external iliac angioplasty and is also had some outflow disease with angioplasty of the below-knee popliteal artery and TP trunk as well as patch angioplasty.  Past Medical History:  Diagnosis Date  . Coronary artery disease   . Diabetes mellitus   . Dyslipidemia   . History of kidney stones   . Hypertension   . Paroxysmal atrial fibrillation (HCC) 12/2010  . Tobacco abuse     Past Surgical History:  Procedure Laterality Date  . ABDOMINAL AORTOGRAM W/LOWER EXTREMITY N/A 07/05/2016   Procedure: Abdominal Aortogram w/Lower Extremity;  Surgeon: Fransisco Hertz, MD;  Location: [facility] INVASIVE CV LAB;  Service:  Cardiovascular;  Laterality: N/A;  . ABDOMINAL AORTOGRAM W/LOWER EXTREMITY N/A 05/06/2017   Procedure: ABDOMINAL AORTOGRAM W/LOWER EXTREMITY;  Surgeon: Maeola Harman, MD;  Location: [facility] INVASIVE CV LAB;  Service: Cardiovascular;  Laterality: N/A;  . BACK SURGERY  1991  . CARDIAC CATHETERIZATION  07/31/2007   This showed nonobstructive coronary artery disease   He had 30% lesion in the mid RCA, 30% lesion in the PDA, 45% lesion in the LAD and mid  LAD, and 30% lesion in the diagonal.  The left circumflex was normal.  The LV function was 60%.     Marland Kitchen ENDARTERECTOMY FEMORAL Left 05/09/2017   Procedure: ENDARTERECTOMY FEMORAL WITH DACRON PATCH ANGIOPLASTY;  Surgeon: Fransisco Hertz, MD;  Location: [facility] OR;  Service: Vascular;  Laterality: Left;  . ENDARTERECTOMY POPLITEAL  09/17/2017   Procedure: ENDARTERECTOMY POPLITEAL ARTERY AND TIBIAL FIBULAR TRUNK;  Surgeon: Chuck Hint, MD;  Location: [facility] OR;  Service: Vascular;;  . FEMORAL-POPLITEAL BYPASS GRAFT Left 05/09/2017   Procedure: BYPASS GRAFT FEMORAL-POPLITEAL ARTERY LEFT;  Surgeon: Fransisco Hertz, MD;  Location: [facility] OR;  Service: Vascular;  Laterality: Left;  . FEMORAL-POPLITEAL BYPASS GRAFT Left 09/17/2017   Procedure: THROMBECTOMY LEFT FEMORAL-POPLITEAL BYPASS GRAFT;  Surgeon: Chuck Hint, MD;  Location: [facility] OR;  Service: Vascular;  Laterality: Left;  . KIDNEY STONE SURGERY  2011  . LOWER EXTREMITY ANGIOGRAPHY N/A 12/06/2016   Procedure: Lower Extremity Angiography;  Surgeon: Fransisco Hertz, MD;  Location: [facility] INVASIVE CV  LAB;  Service: Cardiovascular;  Laterality: N/A;  . LOWER EXTREMITY ANGIOGRAPHY N/A 09/05/2017   Procedure: LOWER EXTREMITY ANGIOGRAPHY;  Surgeon: Fransisco Hertzhen, Brian L, MD;  [facility];  Service: Cardiovascular;  Laterality: N/A;  . PATCH ANGIOPLASTY Left 05/09/2017   Procedure: PATCH ANGIOPLASTY WITH PROPATEN GRAFT  OF BELOW KNEE POPLITEAL ARTERY;  Surgeon: Fransisco Hertzhen, Brian L, MD;  [facility];  Service:  Vascular;  Laterality: Left;  . PATCH ANGIOPLASTY Left 09/17/2017   Procedure: PATCH ANGIOPLASTY WITH PERICARDIAL BOVINE PATCH OF DISTAL ANASTOMOSIS;  Surgeon: Chuck Hintickson, Montgomery Favor S, MD;  [facility];  Service: Vascular;  Laterality: Left;  . PERIPHERAL VASCULAR ATHERECTOMY Right 07/19/2016   Procedure: Peripheral Vascular Atherectomy;  Surgeon: Fransisco Hertzhen, Brian L, MD;  [facility];  Service: Cardiovascular;  Laterality: Right;  . PERIPHERAL VASCULAR BALLOON ANGIOPLASTY Left 07/05/2016   Procedure: Peripheral Vascular Balloon Angioplasty;  Surgeon: Fransisco Hertzhen, Brian L, MD;  [facility];  Service: Cardiovascular;  Laterality: Left;  SFA  . PERIPHERAL VASCULAR BALLOON ANGIOPLASTY Left 09/05/2017   Procedure: PERIPHERAL VASCULAR BALLOON ANGIOPLASTY;  Surgeon: Fransisco Hertzhen, Brian L, MD;  [facility];  Service: Cardiovascular;  Laterality: Left;  Popliteal, profunda femoral, external iliac  . PERIPHERAL VASCULAR INTERVENTION  12/06/2016   Procedure: PERIPHERAL VASCULAR INTERVENTION;  Surgeon: Fransisco Hertzhen, Brian L, MD;  [facility];  Service: Cardiovascular;;  LEFT    Allergies  Allergen Reactions  . Ciprofloxacin Rash    Prior to Admission medications   Medication Sig Start Date End Date Taking? Authorizing Provider  apixaban (ELIQUIS) 5 MG TABS tablet Take 1 tablet (5 mg total) by mouth 2 (two) times daily. 03/26/16   Jodelle GrossLawrence, Kathryn M, NP  atorvastatin (LIPITOR) 10 MG tablet Take 10 mg by mouth daily.  11/12/16   [provider]  benazepril (LOTENSIN) 10 MG tablet Take 1 tablet (10 mg total) by mouth daily. 04/03/16   Jodelle GrossLawrence, Kathryn M, NP  clopidogrel (PLAVIX) 75 MG tablet Take 1 tablet (75 mg total) by mouth daily. 09/20/17   Azalee CourseMeng, Hao, PA  gabapentin (NEURONTIN) 300 MG capsule Take 1 capsule (300 mg total) by mouth 3 (three) times daily. 10/07/17   Nada LibmanBrabham, Vance W, MD  glimepiride (AMARYL) 4 MG tablet Take 1 tablet (4 mg total) by mouth daily  with breakfast. 03/19/16   Jodelle GrossLawrence, Kathryn M, NP  HYDROcodone-acetaminophen (NORCO/VICODIN) 5-325 MG tablet Take 1 tablet by mouth every 6 (six) hours as needed for moderate pain. 05/29/17   Lars Mageollins, Emma M, PA-C  levothyroxine (SYNTHROID, LEVOTHROID) 50 MCG tablet Take 25 mcg by mouth daily before breakfast.     [provider]  metoprolol tartrate (LOPRESSOR) 25 MG tablet TAKE 1 TABLET BY MOUTH TWICE DAILY 01/22/18   Iran OuchArida, Muhammad A, MD  sitaGLIPtin-metformin (JANUMET) 50-1000 MG tablet Take 1 tablet by mouth 2 (two) times daily.    [provider]  varenicline (CHANTIX CONTINUING MONTH PAK) 1 MG tablet Take 1 tablet (1 mg total) by mouth 2 (two) times daily. 09/25/17   Fransisco Hertzhen, Brian L, MD  varenicline (CHANTIX) 0.5 MG tablet Take 0.5 mg by mouth daily for the first three days, then Take 0.5 mg by mouth twice a day for the next four days. 09/25/17   Fransisco Hertzhen, Brian L, MD    Social History   Socioeconomic History  . Marital status: Married    Spouse name: Not on file  . Number of children: Not on file  .  Years of education: Not on file  . Highest education level: Not on file  Occupational History  . Occupation: Nutritional therapist    Comment: by trade  Social Needs  . Financial resource strain: Not on file  . Food insecurity:    Worry: Not on file    Inability: Not on file  . Transportation needs:    Medical: Not on file    Non-medical: Not on file  Tobacco Use  . Smoking status: Current Every Day Smoker    Packs/day: 1.00    Years: 35.00    Pack years: 35.00    Types: Cigarettes  . Smokeless tobacco: Never Used  . Tobacco comment: 2-3 cigarettes per day  Substance and Sexual Activity  . Alcohol use: No  . Drug use: No  . Sexual activity: Not on file  Lifestyle  . Physical activity:    Days per week: Not on file    Minutes per session: Not on file  . Stress: Not on file  Relationships  . Social connections:    Talks on phone: Not on file    Gets together: Not on file     Attends religious service: Not on file    Active member of club or organization: Not on file    Attends meetings of clubs or organizations: Not on file    Relationship status: Not on file  . Intimate partner violence:    Fear of current or ex partner: Not on file    Emotionally abused: Not on file    Physically abused: Not on file    Forced sexual activity: Not on file  Other Topics Concern  . Not on file  Social History Narrative   Has 2 children    Lives in [place] with wife   Currently unemployed     Family History  Problem Relation Age of Onset  . Coronary artery disease Father        CABG in early 94's  . Heart attack Father   . Heart disease Father   . Cancer Mother   . Heart disease Mother   . Heart disease Brother   . Diabetes Maternal Grandmother   . SIDS Sister     ROS: [x]  Positive   [ ]  Negative   [ ]  All sytems reviewed and are negative  Cardiovascular: []  chest pain/pressure []  palpitations []  SOB lying flat []  DOE []  pain in legs while walking [x]  pain in legs at rest (left) []  pain in legs at night []  non-healing ulcers []  hx of DVT []  swelling in legs  Pulmonary: []  productive cough []  asthma/wheezing []  home O2  Neurologic: []  weakness in []  arms []  legs [x]  numbness in []  arms []  legs (left leg) []  hx of CVA []  mini stroke [] difficulty speaking or slurred speech []  temporary loss of vision in one eye []  dizziness  Hematologic: []  hx of cancer []  bleeding problems []  problems with blood clotting easily  Endocrine:   []  diabetes []  thyroid disease  GI []  vomiting blood []  blood in stool  GU: []  CKD/renal failure []  HD--[]  M/W/F or []  T/T/S []  burning with urination []  blood in urine  Psychiatric: []  anxiety []  depression  Musculoskeletal: []  arthritis []  joint pain  Integumentary: []  rashes []  ulcers  Constitutional: []  fever []  chills   Physical Examination  Vitals:   01/23/18 0838 01/23/18 0839  BP:   (!) 124/103  Pulse:  75  Resp:  16  Temp: 98.1 F (  36.7 C)   SpO2:  98%   There is no height or weight on file to calculate BMI.  General:  WDWN in NAD Gait: Not observed HENT: WNL, normocephalic Pulmonary: normal non-labored breathing, without Rales, rhonchi,  wheezing Cardiac: regular, without  Murmurs, rubs or gallops Abdomen: soft, NT/ND, no masses Vascular Exam/Pulses:  Right Left  Radial 2+ (normal) 2+ (normal)  Ulnar    Femoral 2+ (normal) 2+ (normal)  Popliteal  signal  DP  absent  PT  absent   Extremities: Left foot cool to touch, able to wiggle toes, states foot numb Musculoskeletal: no muscle wasting or atrophy  Neurologic: A&O X 3; Appropriate Affect ; SENSATION: normal; MOTOR FUNCTION:  moving all extremities equally. Speech is fluent/normal   CBC    Component Value Date/Time   WBC 17.8 (H) 09/18/2017 0436   RBC 4.28 09/18/2017 0436   HGB 12.9 (L) 09/18/2017 0436   HCT 39.8 09/18/2017 0436   PLT 130 (L) 09/18/2017 0436   MCV 93.0 09/18/2017 0436   MCH 30.1 09/18/2017 0436   MCHC 32.4 09/18/2017 0436   RDW 14.1 09/18/2017 0436   LYMPHSABS 2.9 12/10/2016 1332   MONOABS 1.3 (H) 12/10/2016 1332   EOSABS 0.4 12/10/2016 1332   BASOSABS 0.1 12/10/2016 1332    BMET    Component Value Date/Time   NA 138 09/18/2017 0436   K 3.8 09/18/2017 0436   CL 107 09/18/2017 0436   CO2 23 09/18/2017 0436   GLUCOSE 150 (H) 09/18/2017 0436   BUN 15 09/18/2017 0436   CREATININE 0.93 09/18/2017 0436   CALCIUM 8.8 (L) 09/18/2017 0436   GFRNONAA >60 09/18/2017 0436   GFRAA >60 09/18/2017 0436    COAGS: Lab Results  Component Value Date   INR 1.13 09/17/2017   INR 1.11 05/09/2017   INR 1.0 07/30/2007     Non-Invasive Vascular Imaging:    None to review   ASSESSMENT/PLAN: 57 year old male that presents with acute on chronic limb ischemia of his left lower extremity in the setting of left common femoral to below-knee popliteal bypass with prosthetic that was  placed in March 2019 by Dr. Imogene Burn.  He recently underwent thrombectomy with patch angioplasty of the distal anastomosis on the popliteal and TP trunk.  On exam he has sensory deficit but is able to wiggle his toes and can still move his foot and as a result I recommended an attempt at thrombolysis of his left lower extremity with arteriogram.  Discussed if unable to get catheter down into his graft we may have to perform cutdown with thrombectomy.  He had evidence of inflow disease with external iliac that has been angioplastied as well as treatment of his below-knee popliteal artery and tibioperoneal trunk distal to where the graft looks and with some evidence of previous outflow disease as well.  There is obviously some issues causing the graft to malfunction and if we can lyse him open can be further evaluate this with follow-up imaging.  Discussed he is at risk of limb loss.  Cephus Shelling, MD [facility] Office: [phone] Pager: [phone]

## 2018-01-23 NOTE — Anesthesia Procedure Notes (Signed)
Procedure Name: LMA Insertion Date/Time: 01/23/2018 12:02 PM Performed by: Rachel MouldsLee, Tywone Bembenek B, CRNA Pre-anesthesia Checklist: Patient identified, Emergency Drugs available, Suction available, Patient being monitored and Timeout performed Patient Re-evaluated:Patient Re-evaluated prior to induction Oxygen Delivery Method: Circle system utilized Preoxygenation: Pre-oxygenation with 100% oxygen Induction Type: IV induction Ventilation: Mask ventilation with difficulty LMA: LMA inserted LMA Size: 5.0 Number of attempts: 1 Placement Confirmation: positive ETCO2,  breath sounds checked- equal and bilateral and CO2 detector Dental Injury: Teeth and Oropharynx as per pre-operative assessment

## 2018-01-23 NOTE — Progress Notes (Signed)
OR called to ask the lines to be documented that are in r groin

## 2018-01-24 ENCOUNTER — Encounter (HOSPITAL_COMMUNITY)
Admission: EM | Disposition: A | Discharge status: Home Health | Payer: Self-pay | Source: Home / Self Care | Attending: Vascular Surgery

## 2018-01-24 ENCOUNTER — Other Ambulatory Visit: Payer: Self-pay

## 2018-01-24 ENCOUNTER — Inpatient Hospital Stay (HOSPITAL_COMMUNITY): Payer: BLUE CROSS/BLUE SHIELD | Admitting: Certified Registered Nurse Anesthetist

## 2018-01-24 ENCOUNTER — Inpatient Hospital Stay (HOSPITAL_COMMUNITY): Payer: BLUE CROSS/BLUE SHIELD

## 2018-01-24 ENCOUNTER — Encounter (HOSPITAL_COMMUNITY): Payer: Self-pay | Admitting: Vascular Surgery

## 2018-01-24 DIAGNOSIS — I998 Other disorder of circulatory system: Secondary | ICD-10-CM

## 2018-01-24 DIAGNOSIS — E785 Hyperlipidemia, unspecified: Secondary | ICD-10-CM

## 2018-01-24 DIAGNOSIS — Z720 Tobacco use: Secondary | ICD-10-CM

## 2018-01-24 DIAGNOSIS — I482 Chronic atrial fibrillation, unspecified: Secondary | ICD-10-CM

## 2018-01-24 DIAGNOSIS — I10 Essential (primary) hypertension: Secondary | ICD-10-CM

## 2018-01-24 DIAGNOSIS — IMO0002 Reserved for concepts with insufficient information to code with codable children: Secondary | ICD-10-CM

## 2018-01-24 DIAGNOSIS — J9601 Acute respiratory failure with hypoxia: Secondary | ICD-10-CM

## 2018-01-24 DIAGNOSIS — I4891 Unspecified atrial fibrillation: Secondary | ICD-10-CM

## 2018-01-24 DIAGNOSIS — J810 Acute pulmonary edema: Secondary | ICD-10-CM

## 2018-01-24 DIAGNOSIS — T79A0XA Compartment syndrome, unspecified, initial encounter: Secondary | ICD-10-CM

## 2018-01-24 DIAGNOSIS — E1165 Type 2 diabetes mellitus with hyperglycemia: Secondary | ICD-10-CM

## 2018-01-24 HISTORY — PX: PERCUTANEOUS VENOUS THROMBECTOMY,LYSIS WITH INTRAVASCULAR ULTRASOUND (IVUS): SHX6751

## 2018-01-24 HISTORY — PX: FASCIOTOMY CLOSURE: SHX5829

## 2018-01-24 LAB — POCT I-STAT 7, (LYTES, BLD GAS, ICA,H+H)
Acid-base deficit: 10 mmol/L — ABNORMAL HIGH (ref 0.0–2.0)
Bicarbonate: 16.6 mmol/L — ABNORMAL LOW (ref 20.0–28.0)
Calcium, Ion: 1.22 mmol/L (ref 1.15–1.40)
HCT: 38 % — ABNORMAL LOW (ref 39.0–52.0)
Hemoglobin: 12.9 g/dL — ABNORMAL LOW (ref 13.0–17.0)
O2 Saturation: 96 %
Patient temperature: 37.4
Potassium: 4.7 mmol/L (ref 3.5–5.1)
Sodium: 138 mmol/L (ref 135–145)
TCO2: 18 mmol/L — ABNORMAL LOW (ref 22–32)
pCO2 arterial: 38.7 mmHg (ref 32.0–48.0)
pH, Arterial: 7.243 — ABNORMAL LOW (ref 7.350–7.450)
pO2, Arterial: 98 mmHg (ref 83.0–108.0)

## 2018-01-24 LAB — CBC
HCT: 44.8 % (ref 39.0–52.0)
HCT: 44.4 % (ref 39.0–52.0)
HCT: 33 % — ABNORMAL LOW (ref 39.0–52.0)
Hemoglobin: 14.1 g/dL (ref 13.0–17.0)
Hemoglobin: 14.2 g/dL (ref 13.0–17.0)
Hemoglobin: 10.3 g/dL — ABNORMAL LOW (ref 13.0–17.0)
MCH: 28.7 pg (ref 26.0–34.0)
MCH: 29.6 pg (ref 26.0–34.0)
MCH: 29.3 pg (ref 26.0–34.0)
MCHC: 31.5 g/dL (ref 30.0–36.0)
MCHC: 32 g/dL (ref 30.0–36.0)
MCHC: 31.2 g/dL (ref 30.0–36.0)
MCV: 91.2 fL (ref 80.0–100.0)
MCV: 92.5 fL (ref 80.0–100.0)
MCV: 94 fL (ref 80.0–100.0)
Platelets: 171 10*3/uL (ref 150–400)
Platelets: 105 10*3/uL — ABNORMAL LOW (ref 150–400)
Platelets: 114 10*3/uL — ABNORMAL LOW (ref 150–400)
RBC: 4.91 MIL/uL (ref 4.22–5.81)
RBC: 4.8 MIL/uL (ref 4.22–5.81)
RBC: 3.51 MIL/uL — ABNORMAL LOW (ref 4.22–5.81)
RDW: 13.7 % (ref 11.5–15.5)
RDW: 13.7 % (ref 11.5–15.5)
RDW: 13.9 % (ref 11.5–15.5)
WBC: 16.3 10*3/uL — ABNORMAL HIGH (ref 4.0–10.5)
WBC: 18.9 10*3/uL — ABNORMAL HIGH (ref 4.0–10.5)
WBC: 20.9 10*3/uL — ABNORMAL HIGH (ref 4.0–10.5)
nRBC: 0 % (ref 0.0–0.2)
nRBC: 0 % (ref 0.0–0.2)
nRBC: 0 % (ref 0.0–0.2)

## 2018-01-24 LAB — POCT ACTIVATED CLOTTING TIME
Activated Clotting Time: 0 seconds
Activated Clotting Time: 290 seconds
Activated Clotting Time: 175 seconds
Activated Clotting Time: 202 seconds

## 2018-01-24 LAB — BASIC METABOLIC PANEL
Anion gap: 21 — ABNORMAL HIGH (ref 5–15)
BUN: 12 mg/dL (ref 6–20)
CO2: 14 mmol/L — ABNORMAL LOW (ref 22–32)
Calcium: 9.2 mg/dL (ref 8.9–10.3)
Chloride: 103 mmol/L (ref 98–111)
Creatinine, Ser: 1.23 mg/dL (ref 0.61–1.24)
GFR calc Af Amer: >60 mL/min (ref >60)
GFR calc non Af Amer: >60 mL/min (ref >60)
Glucose, Bld: 302 mg/dL — ABNORMAL HIGH (ref 70–99)
Potassium: 4.1 mmol/L (ref 3.5–5.1)
Sodium: 138 mmol/L (ref 135–145)

## 2018-01-24 LAB — APTT
aPTT: 32 seconds (ref 24–36)
aPTT: 29 seconds (ref 24–36)

## 2018-01-24 LAB — POCT I-STAT 3, ART BLOOD GAS (G3+)
Acid-base deficit: 6 mmol/L — ABNORMAL HIGH (ref 0.0–2.0)
Bicarbonate: 18.4 mmol/L — ABNORMAL LOW (ref 20.0–28.0)
O2 Saturation: 95 %
Patient temperature: 98.6
TCO2: 19 mmol/L — ABNORMAL LOW (ref 22–32)
pCO2 arterial: 33.4 mmHg (ref 32.0–48.0)
pH, Arterial: 7.349 — ABNORMAL LOW (ref 7.350–7.450)
pO2, Arterial: 79 mmHg — ABNORMAL LOW (ref 83.0–108.0)

## 2018-01-24 LAB — TRIGLYCERIDES: Triglycerides: 75 mg/dL (ref <150)

## 2018-01-24 LAB — TSH: TSH: 0.944 u[IU]/mL (ref 0.350–4.500)

## 2018-01-24 LAB — HEMOGLOBIN A1C
Hgb A1c MFr Bld: 7.6 % — ABNORMAL HIGH (ref 4.8–5.6)
Mean Plasma Glucose: 171.42 mg/dL

## 2018-01-24 LAB — MAGNESIUM: Magnesium: 1.5 mg/dL — ABNORMAL LOW (ref 1.7–2.4)

## 2018-01-24 LAB — HEPARIN LEVEL (UNFRACTIONATED)
Heparin Unfractionated: 0.6 IU/mL (ref 0.30–0.70)
Heparin Unfractionated: <0.1 IU/mL — ABNORMAL LOW (ref 0.30–0.70)

## 2018-01-24 LAB — FIBRINOGEN
Fibrinogen: 364 mg/dL (ref 210–475)
Fibrinogen: 193 mg/dL — ABNORMAL LOW (ref 210–475)

## 2018-01-24 LAB — GLUCOSE, CAPILLARY: Glucose-Capillary: 237 mg/dL — ABNORMAL HIGH (ref 70–99)

## 2018-01-24 SURGERY — THROMBECTOMY, MECHANICAL
Anesthesia: General | Laterality: Left

## 2018-01-24 MED ORDER — EPHEDRINE SULFATE-NACL 50-0.9 MG/10ML-% IV SOSY
PREFILLED_SYRINGE | INTRAVENOUS | Status: DC | PRN
  Administered 2018-01-24: 5 mg via INTRAVENOUS
  Administered 2018-01-24: 10 mg via INTRAVENOUS
  Administered 2018-01-24: 5 mg via INTRAVENOUS

## 2018-01-24 MED ORDER — PANTOPRAZOLE SODIUM 40 MG PO TBEC: 40.0000 mg | DELAYED_RELEASE_TABLET | Freq: Every day | ORAL | Status: DC

## 2018-01-24 MED ORDER — FENTANYL BOLUS VIA INFUSION
50.0000 ug | INTRAVENOUS | Status: DC | PRN
  Administered 2018-01-25: 50 ug via INTRAVENOUS
  Filled 2018-01-24: qty 50

## 2018-01-24 MED ORDER — SODIUM CHLORIDE 0.9 % IV SOLN
INTRAVENOUS | Status: DC | PRN
  Administered 2018-01-24 (×2)

## 2018-01-24 MED ORDER — NOREPINEPHRINE 4 MG/250ML-% IV SOLN: 0.0000 ug/min | INTRAVENOUS | Status: DC

## 2018-01-24 MED ORDER — PHENOL 1.4 % MT LIQD: 1.0000 | OROMUCOSAL | Status: DC | PRN

## 2018-01-24 MED ORDER — NITROGLYCERIN FOR UTERINE RELAXATION OPTIME 200MCG/ML
INTRAVENOUS | Status: DC | PRN
  Administered 2018-01-24: 200 ug via INTRAVENOUS

## 2018-01-24 MED ORDER — AMIODARONE HCL IN DEXTROSE 360-4.14 MG/200ML-% IV SOLN
60.0000 mg/h | INTRAVENOUS | Status: AC
Start: 2018-01-24 — End: 2018-01-25
  Administered 2018-01-24 – 2018-01-25 (×2): 60 mg/h via INTRAVENOUS
  Filled 2018-01-24: qty 200

## 2018-01-24 MED ORDER — PHENYLEPHRINE 40 MCG/ML (10ML) SYRINGE FOR IV PUSH (FOR BLOOD PRESSURE SUPPORT)
PREFILLED_SYRINGE | INTRAVENOUS | Status: DC | PRN
  Administered 2018-01-24: 120 ug via INTRAVENOUS
  Administered 2018-01-24 (×2): 80 ug via INTRAVENOUS
  Administered 2018-01-24 (×9): 120 ug via INTRAVENOUS

## 2018-01-24 MED ORDER — MORPHINE SULFATE (PF) 4 MG/ML IV SOLN
4.0000 mg | INTRAVENOUS | Status: DC | PRN
  Administered 2018-01-26: 4 mg via INTRAVENOUS
  Filled 2018-01-24 (×2): qty 1

## 2018-01-24 MED ORDER — GUAIFENESIN-DM 100-10 MG/5ML PO SYRP: 15.0000 mL | ORAL_SOLUTION | ORAL | Status: DC | PRN

## 2018-01-24 MED ORDER — DOCUSATE SODIUM 100 MG PO CAPS: 100.0000 mg | ORAL_CAPSULE | Freq: Every day | ORAL | Status: DC

## 2018-01-24 MED ORDER — MIDAZOLAM BOLUS VIA INFUSION
1.0000 mg | INTRAVENOUS | Status: DC | PRN
  Administered 2018-01-26: 2 mg via INTRAVENOUS
  Filled 2018-01-24: qty 2

## 2018-01-24 MED ORDER — LABETALOL HCL 5 MG/ML IV SOLN
10.0000 mg | INTRAVENOUS | Status: DC | PRN
  Administered 2018-01-28: 10 mg via INTRAVENOUS
  Filled 2018-01-24: qty 4

## 2018-01-24 MED ORDER — ONDANSETRON HCL 4 MG/2ML IJ SOLN
INTRAMUSCULAR | Status: DC | PRN
  Administered 2018-01-24: 4 mg via INTRAVENOUS

## 2018-01-24 MED ORDER — IODIXANOL 320 MG/ML IV SOLN
INTRAVENOUS | Status: DC | PRN
  Administered 2018-01-24: 150 mL via INTRAVENOUS

## 2018-01-24 MED ORDER — SODIUM CHLORIDE 0.9 % IV SOLN
INTRAVENOUS | Status: AC
  Filled 2018-01-24: qty 1.2

## 2018-01-24 MED ORDER — VASOPRESSIN 20 UNIT/ML IV SOLN
20.0000 [IU] | Freq: Once | INTRAVENOUS | Status: DC
  Filled 2018-01-24 (×2): qty 1

## 2018-01-24 MED ORDER — SODIUM BICARBONATE 8.4 % IV SOLN
INTRAVENOUS | Status: AC
  Filled 2018-01-24: qty 50

## 2018-01-24 MED ORDER — ROCURONIUM BROMIDE 10 MG/ML (PF) SYRINGE
PREFILLED_SYRINGE | INTRAVENOUS | Status: DC | PRN
  Administered 2018-01-24 (×3): 20 mg via INTRAVENOUS
  Administered 2018-01-24: 50 mg via INTRAVENOUS
  Administered 2018-01-24: 30 mg via INTRAVENOUS
  Administered 2018-01-24: 10 mg via INTRAVENOUS

## 2018-01-24 MED ORDER — CEFAZOLIN SODIUM-DEXTROSE 2-4 GM/100ML-% IV SOLN
2.0000 g | Freq: Once | INTRAVENOUS | Status: AC
  Administered 2018-01-24: 2 g via INTRAVENOUS

## 2018-01-24 MED ORDER — BENAZEPRIL HCL 10 MG PO TABS
10.0000 mg | ORAL_TABLET | Freq: Every day | ORAL | Status: DC
  Filled 2018-01-24: qty 1

## 2018-01-24 MED ORDER — AMIODARONE LOAD VIA INFUSION
150.0000 mg | Freq: Once | INTRAVENOUS | Status: AC
  Filled 2018-01-24: qty 83.34

## 2018-01-24 MED ORDER — LIDOCAINE 2% (20 MG/ML) 5 ML SYRINGE
INTRAMUSCULAR | Status: AC
  Filled 2018-01-24: qty 5

## 2018-01-24 MED ORDER — DILTIAZEM HCL-DEXTROSE 100-5 MG/100ML-% IV SOLN (PREMIX)
5.0000 mg/h | INTRAVENOUS | Status: DC
  Administered 2018-01-24: 10 mg/h via INTRAVENOUS
  Filled 2018-01-24: qty 100

## 2018-01-24 MED ORDER — SUGAMMADEX SODIUM 200 MG/2ML IV SOLN
INTRAVENOUS | Status: DC | PRN
  Administered 2018-01-24: 200 mg via INTRAVENOUS

## 2018-01-24 MED ORDER — FENTANYL CITRATE (PF) 100 MCG/2ML IJ SOLN
150.0000 ug | Freq: Once | INTRAMUSCULAR | Status: AC
  Administered 2018-01-24: 150 ug via INTRAVENOUS

## 2018-01-24 MED ORDER — FENTANYL CITRATE (PF) 100 MCG/2ML IJ SOLN
50.0000 ug | Freq: Once | INTRAMUSCULAR | Status: AC
  Administered 2018-01-24: 50 ug via INTRAVENOUS

## 2018-01-24 MED ORDER — CEFAZOLIN SODIUM-DEXTROSE 2-4 GM/100ML-% IV SOLN
2.0000 g | Freq: Three times a day (TID) | INTRAVENOUS | Status: AC
  Administered 2018-01-25 (×2): 2 g via INTRAVENOUS
  Filled 2018-01-24 (×2): qty 100

## 2018-01-24 MED ORDER — ESMOLOL HCL 100 MG/10ML IV SOLN
INTRAVENOUS | Status: DC | PRN
  Administered 2018-01-24: 50 mg via INTRAVENOUS
  Administered 2018-01-24: 30 mg via INTRAVENOUS
  Administered 2018-01-24 (×2): 10 mg via INTRAVENOUS

## 2018-01-24 MED ORDER — FENTANYL 2500MCG IN NS 250ML (10MCG/ML) PREMIX INFUSION
25.0000 ug/h | INTRAVENOUS | Status: DC
  Administered 2018-01-24: 25 ug/h via INTRAVENOUS
  Administered 2018-01-25: 250 ug/h via INTRAVENOUS
  Administered 2018-01-25: 150 ug/h via INTRAVENOUS
  Administered 2018-01-26: 300 ug/h via INTRAVENOUS
  Filled 2018-01-24 (×4): qty 250

## 2018-01-24 MED ORDER — PROMETHAZINE HCL 25 MG/ML IJ SOLN
25.0000 mg | Freq: Once | INTRAMUSCULAR | Status: AC
  Administered 2018-01-24: 25 mg via INTRAVENOUS
  Filled 2018-01-24: qty 1

## 2018-01-24 MED ORDER — FENTANYL CITRATE (PF) 100 MCG/2ML IJ SOLN
25.0000 ug | INTRAMUSCULAR | Status: DC | PRN
  Administered 2018-01-30: 50 ug via INTRAVENOUS
  Administered 2018-01-30: 25 ug via INTRAVENOUS
  Filled 2018-01-24 (×5): qty 2

## 2018-01-24 MED ORDER — PANTOPRAZOLE SODIUM 40 MG PO PACK
40.0000 mg | PACK | Freq: Every day | ORAL | Status: DC
  Administered 2018-01-25 – 2018-01-26 (×3): 40 mg
  Filled 2018-01-24 (×3): qty 20

## 2018-01-24 MED ORDER — MIDAZOLAM HCL 2 MG/2ML IJ SOLN
2.0000 mg | INTRAMUSCULAR | Status: DC | PRN
  Filled 2018-01-24 (×2): qty 2

## 2018-01-24 MED ORDER — ROCURONIUM BROMIDE 50 MG/5ML IV SOSY
PREFILLED_SYRINGE | INTRAVENOUS | Status: AC
  Filled 2018-01-24: qty 5

## 2018-01-24 MED ORDER — SODIUM CHLORIDE 0.9 % IV SOLN
INTRAVENOUS | Status: DC | PRN
  Administered 2018-01-24: 40 ug/min via INTRAVENOUS
  Administered 2018-01-24: 18:00:00 via INTRAVENOUS

## 2018-01-24 MED ORDER — DEXAMETHASONE SODIUM PHOSPHATE 10 MG/ML IJ SOLN
INTRAMUSCULAR | Status: DC | PRN
  Administered 2018-01-24: 4 mg via INTRAVENOUS

## 2018-01-24 MED ORDER — AMIODARONE HCL IN DEXTROSE 360-4.14 MG/200ML-% IV SOLN
INTRAVENOUS | Status: AC
  Filled 2018-01-24: qty 200

## 2018-01-24 MED ORDER — ATORVASTATIN CALCIUM 80 MG PO TABS: 80.0000 mg | ORAL_TABLET | Freq: Every day | ORAL | Status: DC

## 2018-01-24 MED ORDER — HEPARIN (PORCINE) 25000 UT/250ML-% IV SOLN
600.0000 [IU]/h | INTRAVENOUS | Status: DC
  Administered 2018-01-25 (×2): 600 [IU]/h via INTRAVENOUS
  Filled 2018-01-24: qty 250

## 2018-01-24 MED ORDER — LACTATED RINGERS IV SOLN
INTRAVENOUS | Status: DC | PRN
  Administered 2018-01-24 (×2): via INTRAVENOUS

## 2018-01-24 MED ORDER — MIDAZOLAM HCL 5 MG/5ML IJ SOLN
INTRAMUSCULAR | Status: DC | PRN
  Administered 2018-01-24: 2 mg via INTRAVENOUS

## 2018-01-24 MED ORDER — MAGNESIUM SULFATE 2 GM/50ML IV SOLN
2.0000 g | Freq: Every day | INTRAVENOUS | Status: DC | PRN
  Filled 2018-01-24: qty 50

## 2018-01-24 MED ORDER — HEPARIN SODIUM (PORCINE) 1000 UNIT/ML IJ SOLN
INTRAMUSCULAR | Status: DC | PRN
  Administered 2018-01-24: 1000 [IU] via INTRAVENOUS
  Administered 2018-01-24: 5000 [IU] via INTRAVENOUS
  Administered 2018-01-24: 3000 [IU] via INTRAVENOUS

## 2018-01-24 MED ORDER — DILTIAZEM LOAD VIA INFUSION
INTRAVENOUS | Status: DC | PRN
  Administered 2018-01-24: 20 mg via INTRAVENOUS

## 2018-01-24 MED ORDER — NOREPINEPHRINE 4 MG/250ML-% IV SOLN
INTRAVENOUS | Status: AC
  Filled 2018-01-24: qty 250

## 2018-01-24 MED ORDER — FENTANYL CITRATE (PF) 250 MCG/5ML IJ SOLN
INTRAMUSCULAR | Status: AC
  Filled 2018-01-24: qty 5

## 2018-01-24 MED ORDER — SODIUM CHLORIDE 0.9 % IV SOLN
INTRAVENOUS | Status: DC
  Administered 2018-01-24 – 2018-01-25 (×2): via INTRAVENOUS

## 2018-01-24 MED ORDER — CEFAZOLIN SODIUM-DEXTROSE 2-4 GM/100ML-% IV SOLN
INTRAVENOUS | Status: AC
  Filled 2018-01-24: qty 100

## 2018-01-24 MED ORDER — MIDAZOLAM HCL 2 MG/2ML IJ SOLN
2.0000 mg | INTRAMUSCULAR | Status: AC | PRN
  Administered 2018-01-24 (×3): 2 mg via INTRAVENOUS

## 2018-01-24 MED ORDER — EPINEPHRINE PF 1 MG/10ML IJ SOSY
0.0500 mg | PREFILLED_SYRINGE | Freq: Once | INTRAMUSCULAR | Status: AC
  Administered 2018-01-24: 0.05 mg via INTRAVENOUS

## 2018-01-24 MED ORDER — HYDROMORPHONE HCL 1 MG/ML IJ SOLN
INTRAMUSCULAR | Status: AC
  Filled 2018-01-24: qty 1

## 2018-01-24 MED ORDER — ALBUMIN HUMAN 5 % IV SOLN
INTRAVENOUS | Status: DC | PRN
  Administered 2018-01-24 (×5): via INTRAVENOUS

## 2018-01-24 MED ORDER — FENTANYL CITRATE (PF) 100 MCG/2ML IJ SOLN
INTRAMUSCULAR | Status: AC
  Administered 2018-01-24: 100 ug
  Filled 2018-01-24: qty 4

## 2018-01-24 MED ORDER — PROMETHAZINE HCL 25 MG/ML IJ SOLN: 6.2500 mg | INTRAMUSCULAR | Status: DC | PRN

## 2018-01-24 MED ORDER — PROPOFOL 1000 MG/100ML IV EMUL
INTRAVENOUS | Status: AC
  Administered 2018-01-24: 5 ug/kg/min via INTRAVENOUS
  Filled 2018-01-24: qty 100

## 2018-01-24 MED ORDER — MIDAZOLAM HCL 2 MG/2ML IJ SOLN
INTRAMUSCULAR | Status: AC
  Filled 2018-01-24: qty 2

## 2018-01-24 MED ORDER — PROPOFOL 10 MG/ML IV BOLUS
INTRAVENOUS | Status: AC
  Filled 2018-01-24: qty 20

## 2018-01-24 MED ORDER — PROPOFOL 10 MG/ML IV BOLUS
INTRAVENOUS | Status: DC | PRN
  Administered 2018-01-24: 50 mg via INTRAVENOUS
  Administered 2018-01-24: 200 mg via INTRAVENOUS
  Administered 2018-01-24 (×2): 50 mg via INTRAVENOUS
  Administered 2018-01-24: 100 mg via INTRAVENOUS

## 2018-01-24 MED ORDER — SODIUM CHLORIDE 0.9 % IV SOLN: 500.0000 mL | Freq: Once | INTRAVENOUS | Status: DC | PRN

## 2018-01-24 MED ORDER — PHENYLEPHRINE HCL-NACL 10-0.9 MG/250ML-% IV SOLN
INTRAVENOUS | Status: AC
  Filled 2018-01-24: qty 250

## 2018-01-24 MED ORDER — SODIUM CHLORIDE 0.9 % IV SOLN
0.0000 mg/h | INTRAVENOUS | Status: DC
  Administered 2018-01-24: 2 mg/h via INTRAVENOUS
  Administered 2018-01-25: 3 mg/h via INTRAVENOUS
  Administered 2018-01-25: 6 mg/h via INTRAVENOUS
  Filled 2018-01-24 (×4): qty 10

## 2018-01-24 MED ORDER — VASOPRESSIN 20 UNIT/ML IV SOLN
INTRAVENOUS | Status: DC | PRN
  Administered 2018-01-24 (×7): 2 [IU] via INTRAVENOUS

## 2018-01-24 MED ORDER — DEXAMETHASONE SODIUM PHOSPHATE 10 MG/ML IJ SOLN
INTRAMUSCULAR | Status: AC
  Filled 2018-01-24: qty 1

## 2018-01-24 MED ORDER — ACETAMINOPHEN 10 MG/ML IV SOLN
1000.0000 mg | Freq: Four times a day (QID) | INTRAVENOUS | Status: AC | PRN
  Filled 2018-01-24: qty 100

## 2018-01-24 MED ORDER — ONDANSETRON HCL 4 MG/2ML IJ SOLN
INTRAMUSCULAR | Status: AC
  Filled 2018-01-24: qty 2

## 2018-01-24 MED ORDER — ALUM & MAG HYDROXIDE-SIMETH 200-200-20 MG/5ML PO SUSP
15.0000 mL | ORAL | Status: DC | PRN
  Filled 2018-01-24: qty 30

## 2018-01-24 MED ORDER — AMIODARONE HCL IN DEXTROSE 360-4.14 MG/200ML-% IV SOLN
30.0000 mg/h | INTRAVENOUS | Status: DC
  Administered 2018-01-25 – 2018-01-26 (×4): 30 mg/h via INTRAVENOUS
  Filled 2018-01-24 (×3): qty 200

## 2018-01-24 MED ORDER — ATORVASTATIN CALCIUM 80 MG PO TABS
80.0000 mg | ORAL_TABLET | Freq: Every day | ORAL | Status: DC
  Administered 2018-01-25 – 2018-01-26 (×3): 80 mg
  Filled 2018-01-24 (×3): qty 1

## 2018-01-24 MED ORDER — HYDROMORPHONE HCL 1 MG/ML IJ SOLN: 0.2500 mg | INTRAMUSCULAR | Status: DC | PRN

## 2018-01-24 MED ORDER — CLOPIDOGREL BISULFATE 75 MG PO TABS
75.0000 mg | ORAL_TABLET | Freq: Every day | ORAL | Status: DC
  Administered 2018-01-25 – 2018-01-26 (×3): 75 mg
  Filled 2018-01-24 (×3): qty 1

## 2018-01-24 MED ORDER — METOPROLOL TARTRATE 25 MG PO TABS
25.0000 mg | ORAL_TABLET | Freq: Two times a day (BID) | ORAL | Status: DC
  Administered 2018-01-25: 25 mg
  Filled 2018-01-24 (×3): qty 1

## 2018-01-24 MED ORDER — NITROGLYCERIN IN D5W 200-5 MCG/ML-% IV SOLN
INTRAVENOUS | Status: AC
  Filled 2018-01-24: qty 250

## 2018-01-24 MED ORDER — ASPIRIN 81 MG PO CHEW
81.0000 mg | CHEWABLE_TABLET | Freq: Every day | ORAL | Status: DC
  Administered 2018-01-25 – 2018-01-26 (×3): 81 mg
  Filled 2018-01-24 (×3): qty 1

## 2018-01-24 MED ORDER — CLOPIDOGREL BISULFATE 75 MG PO TABS: 75.0000 mg | ORAL_TABLET | Freq: Every day | ORAL | Status: DC

## 2018-01-24 MED ORDER — FENTANYL CITRATE (PF) 100 MCG/2ML IJ SOLN
INTRAMUSCULAR | Status: DC | PRN
  Administered 2018-01-24: 100 ug via INTRAVENOUS
  Administered 2018-01-24: 50 ug via INTRAVENOUS
  Administered 2018-01-24: 25 ug via INTRAVENOUS

## 2018-01-24 MED ORDER — ETOMIDATE 2 MG/ML IV SOLN
INTRAVENOUS | Status: AC
  Filled 2018-01-24: qty 10

## 2018-01-24 MED ORDER — DILTIAZEM HCL-DEXTROSE 100-5 MG/100ML-% IV SOLN (PREMIX)
5.0000 mg/h | INTRAVENOUS | Status: DC
  Administered 2018-01-24: 5 mg/h via INTRAVENOUS
  Filled 2018-01-24 (×3): qty 100

## 2018-01-24 MED ORDER — MAGNESIUM SULFATE 2 GM/50ML IV SOLN
2.0000 g | Freq: Once | INTRAVENOUS | Status: DC
  Filled 2018-01-24: qty 50

## 2018-01-24 MED ORDER — ASPIRIN 81 MG PO CHEW: 81.0000 mg | CHEWABLE_TABLET | Freq: Every day | ORAL | Status: DC

## 2018-01-24 MED ORDER — ALBUMIN HUMAN 25 % IV SOLN
25.0000 g | Freq: Once | INTRAVENOUS | Status: AC
  Administered 2018-01-24: 25 g via INTRAVENOUS
  Filled 2018-01-24: qty 50

## 2018-01-24 MED ORDER — DOCUSATE SODIUM 50 MG/5ML PO LIQD
100.0000 mg | Freq: Every day | ORAL | Status: DC
  Administered 2018-01-25 – 2018-01-26 (×2): 100 mg
  Filled 2018-01-24 (×2): qty 10

## 2018-01-24 MED ORDER — ALBUMIN HUMAN 5 % IV SOLN
INTRAVENOUS | Status: AC
  Filled 2018-01-24: qty 250

## 2018-01-24 MED ORDER — MIDAZOLAM HCL 2 MG/2ML IJ SOLN
INTRAMUSCULAR | Status: AC
  Administered 2018-01-24: 2 mg via INTRAVENOUS
  Filled 2018-01-24: qty 2

## 2018-01-24 MED ORDER — CALCIUM CHLORIDE 10 % IV SOLN
INTRAVENOUS | Status: DC | PRN
  Administered 2018-01-24: 1 g via INTRAVENOUS

## 2018-01-24 MED ORDER — 0.9 % SODIUM CHLORIDE (POUR BTL) OPTIME
TOPICAL | Status: DC | PRN
  Administered 2018-01-24: 1000 mL

## 2018-01-24 MED ORDER — POTASSIUM CHLORIDE CRYS ER 20 MEQ PO TBCR
20.0000 meq | EXTENDED_RELEASE_TABLET | Freq: Every day | ORAL | Status: AC | PRN
  Administered 2018-01-25: 20 meq via ORAL
  Filled 2018-01-24: qty 1

## 2018-01-24 MED ORDER — SODIUM BICARBONATE 8.4 % IV SOLN
INTRAVENOUS | Status: DC | PRN
  Administered 2018-01-24 (×3): 50 mL via INTRAVENOUS

## 2018-01-24 MED ORDER — EPINEPHRINE PF 1 MG/10ML IJ SOSY
PREFILLED_SYRINGE | INTRAMUSCULAR | Status: AC
  Administered 2018-01-24: 0.05 mg via INTRAVENOUS
  Filled 2018-01-24: qty 10

## 2018-01-24 MED ORDER — PHENYLEPHRINE HCL-NACL 10-0.9 MG/250ML-% IV SOLN
0.0000 ug/min | INTRAVENOUS | Status: DC
  Administered 2018-01-24: 130 ug/min via INTRAVENOUS
  Administered 2018-01-24 – 2018-01-25 (×3): 30 ug/min via INTRAVENOUS
  Administered 2018-01-25: 100 ug/min via INTRAVENOUS
  Administered 2018-01-25: 60 ug/min via INTRAVENOUS
  Administered 2018-01-26 – 2018-01-27 (×2): 10 ug/min via INTRAVENOUS
  Administered 2018-01-27: 20 ug/min via INTRAVENOUS
  Filled 2018-01-24 (×8): qty 250

## 2018-01-24 MED ORDER — LACTATED RINGERS IV SOLN
INTRAVENOUS | Status: DC
  Administered 2018-01-24 – 2018-01-25 (×2): via INTRAVENOUS

## 2018-01-24 MED ORDER — PROPOFOL 1000 MG/100ML IV EMUL
5.0000 ug/kg/min | INTRAVENOUS | Status: DC
  Administered 2018-01-24: 5 ug/kg/min via INTRAVENOUS

## 2018-01-24 MED ORDER — LACTATED RINGERS IV BOLUS
500.0000 mL | Freq: Once | INTRAVENOUS | Status: AC
  Administered 2018-01-24: 500 mL via INTRAVENOUS

## 2018-01-24 SURGICAL SUPPLY — 75 items
BAG SNAP BAND KOVER 36X36 (MISCELLANEOUS) ×2 IMPLANT
BALLN MUSTANG 6X100X135 (BALLOONS) ×2
BALLN STERLING OTW 3X150X150 (BALLOONS) ×2
BALLN STERLING OTW 3X60X150 (BALLOONS) ×2
BALLOON MUSTANG 6X100X135 (BALLOONS) ×1 IMPLANT
BALLOON STERLING OTW 3X150X150 (BALLOONS) ×1 IMPLANT
BALLOON STERLING OTW 3X60X150 (BALLOONS) ×1 IMPLANT
BANDAGE ACE 4X5 VEL STRL LF (GAUZE/BANDAGES/DRESSINGS) IMPLANT
BANDAGE ACE 6X5 VEL STRL LF (GAUZE/BANDAGES/DRESSINGS) IMPLANT
BLADE SURG 11 STRL SS (BLADE) IMPLANT
CANISTER PENUMBRA ENGINE (MISCELLANEOUS) ×2 IMPLANT
CANISTER SUCT 3000ML PPV (MISCELLANEOUS) ×2 IMPLANT
CATH ANGIO 5F BER2 100CM (CATHETERS) ×2 IMPLANT
CATH INDIGO CAT6 KIT (CATHETERS) ×2 IMPLANT
CATH OMNI FLUSH .035X70CM (CATHETERS) IMPLANT
CATH QUICKCROSS .035X135CM (MICROCATHETER) ×2 IMPLANT
CATH VISIONS PV .035 IVUS (CATHETERS) ×2 IMPLANT
CLOSURE MYNX CONTROL 6F/7F (Vascular Products) ×2 IMPLANT
COVER BACK TABLE 60X90IN (DRAPES) ×4 IMPLANT
COVER DOME SNAP 22 D (MISCELLANEOUS) ×4 IMPLANT
COVER PROBE W GEL 5X96 (DRAPES) ×2 IMPLANT
COVER SURGICAL LIGHT HANDLE (MISCELLANEOUS) ×2 IMPLANT
COVER WAND RF STERILE (DRAPES) IMPLANT
DERMABOND ADVANCED (GAUZE/BANDAGES/DRESSINGS) ×1
DERMABOND ADVANCED .7 DNX12 (GAUZE/BANDAGES/DRESSINGS) ×1 IMPLANT
DEVICE INFLATION ENCORE 26 (MISCELLANEOUS) ×4 IMPLANT
DRAPE FEMORAL ANGIO 80X135IN (DRAPES) ×2 IMPLANT
DRAPE UNIVERSAL (DRAPES) IMPLANT
DRSG TEGADERM 4X4.75 (GAUZE/BANDAGES/DRESSINGS) ×2 IMPLANT
DRSG VAC ATS MED SENSATRAC (GAUZE/BANDAGES/DRESSINGS) ×2 IMPLANT
ELECT REM PT RETURN 9FT ADLT (ELECTROSURGICAL)
ELECTRODE REM PT RTRN 9FT ADLT (ELECTROSURGICAL) IMPLANT
GAUZE 4X4 16PLY RFD (DISPOSABLE) ×2 IMPLANT
GAUZE SPONGE 2X2 8PLY STRL LF (GAUZE/BANDAGES/DRESSINGS) ×1 IMPLANT
GLOVE BIOGEL PI IND STRL 7.5 (GLOVE) ×1 IMPLANT
GLOVE BIOGEL PI INDICATOR 7.5 (GLOVE) ×1
GLOVE SURG SS PI 7.5 STRL IVOR (GLOVE) ×2 IMPLANT
GOWN STRL REUS W/ TWL LRG LVL3 (GOWN DISPOSABLE) ×1 IMPLANT
GOWN STRL REUS W/ TWL XL LVL3 (GOWN DISPOSABLE) ×2 IMPLANT
GOWN STRL REUS W/TWL LRG LVL3 (GOWN DISPOSABLE) ×1
GOWN STRL REUS W/TWL XL LVL3 (GOWN DISPOSABLE) ×2
GUIDEWIRE STR TIP .014X300X8 (WIRE) ×4 IMPLANT
KIT BASIN OR (CUSTOM PROCEDURE TRAY) ×2 IMPLANT
KIT TURNOVER KIT B (KITS) ×2 IMPLANT
NEEDLE HYPO 25GX1X1/2 BEV (NEEDLE) IMPLANT
NEEDLE PERC 18GX7CM (NEEDLE) ×2 IMPLANT
NS IRRIG 1000ML POUR BTL (IV SOLUTION) IMPLANT
PAD ARMBOARD 7.5X6 YLW CONV (MISCELLANEOUS) ×4 IMPLANT
PAD NEG PRESSURE SENSATRAC (MISCELLANEOUS) ×2 IMPLANT
PROTECTION STATION PRESSURIZED (MISCELLANEOUS) ×2
SET MICROPUNCTURE 5F STIFF (MISCELLANEOUS) ×2 IMPLANT
SET ZELANTE DVT THROMB (CATHETERS) IMPLANT
SHEATH AVANTI 11CM 5FR (SHEATH) IMPLANT
SHEATH BRITE TIP 7FRX11 (SHEATH) ×2 IMPLANT
SHEATH PINNACLE R/O II 7F 4CM (SHEATH) ×2 IMPLANT
SHIELD RADPAD SCOOP 12X17 (MISCELLANEOUS) ×2 IMPLANT
SPONGE GAUZE 2X2 STER 10/PKG (GAUZE/BANDAGES/DRESSINGS) ×1
STATION PROTECTION PRESSURIZED (MISCELLANEOUS) ×1 IMPLANT
STENT ELUVIA 7X120X130 (Permanent Stent) ×2 IMPLANT
STENT SYNERGY DES 3X28 (Permanent Stent) ×2 IMPLANT
STENT SYNERGY DES 3X32 (Permanent Stent) ×2 IMPLANT
STOPCOCK MORSE 400PSI 3WAY (MISCELLANEOUS) ×2 IMPLANT
SUT PROLENE 5 0 C 1 24 (SUTURE) ×2 IMPLANT
SUT PROLENE 6 0 BV (SUTURE) ×2 IMPLANT
SUT VIC AB 4-0 PS2 27 (SUTURE) ×2 IMPLANT
SYR 10ML LL (SYRINGE) ×4 IMPLANT
SYR 20CC LL (SYRINGE) ×4 IMPLANT
SYR 30ML LL (SYRINGE) IMPLANT
SYR CONTROL 10ML LL (SYRINGE) IMPLANT
TOWEL GREEN STERILE (TOWEL DISPOSABLE) ×4 IMPLANT
TOWEL GREEN STERILE FF (TOWEL DISPOSABLE) ×2 IMPLANT
TRAY FOLEY MTR SLVR 16FR STAT (SET/KITS/TRAYS/PACK) IMPLANT
WIRE BENTSON .035X145CM (WIRE) ×4 IMPLANT
WIRE G V18X300CM (WIRE) ×6 IMPLANT
WND VAC CANISTER 500ML (MISCELLANEOUS) ×2 IMPLANT

## 2018-01-24 NOTE — Progress Notes (Signed)
Pt vomited large amount of brown/tan bile (3 episodes total). Intermittent, but persistent nausea despite IV zofran & peppermint essential oil. Pt still in Afib with HR 110-150s. Vascular surgery paged. Awaiting call back.

## 2018-01-24 NOTE — Progress Notes (Signed)
    Subjective  - POD #1  No complaints Bleeding from sheath overnight AFIB RVR   Physical Exam:  Left foot without motor or sensory function.  Patient states that it is no different from 2 days ago No doppler signals in left foot Right groin soft.  No active bleeding       Assessment/Plan:  POD #1  AFIB:  Will consult cardiology.  Minimal response to dilt gtt Left leg:  For follow up angio today  Wells Brabham 01/24/2018 7:47 AM --  Vitals:   01/24/18 0720 01/24/18 0732  BP: (!) 168/117 (!) 158/53  Pulse: (!) 153 (!) 108  Resp:    Temp:    SpO2: 90% (!) 88%    Intake/Output Summary (Last 24 hours) at 01/24/2018 0747 Last data filed at 01/24/2018 0600 Gross per 24 hour  Intake 3338.99 ml  Output 1875 ml  Net 1463.99 ml     Laboratory CBC    Component Value Date/Time   WBC 16.3 (H) 01/24/2018 0326   HGB 14.1 01/24/2018 0326   HCT 44.8 01/24/2018 0326   PLT 171 01/24/2018 0326    BMET    Component Value Date/Time   NA 139 01/23/2018 0923   K 3.9 01/23/2018 0923   CL 104 01/23/2018 0923   CO2 22 01/23/2018 0908   GLUCOSE 229 (H) 01/23/2018 0923   BUN 21 (H) 01/23/2018 0923   CREATININE 1.00 01/23/2018 0923   CALCIUM 9.8 01/23/2018 0908   GFRNONAA >60 01/23/2018 0908   GFRAA >60 01/23/2018 0908    COAG Lab Results  Component Value Date   INR 1.24 01/23/2018   INR 1.13 09/17/2017   INR 1.11 05/09/2017   No results found for: PTT  Antibiotics Anti-infectives (From admission, onward)   None       V. Charlena CrossWells Brabham IV, M.D. [facility] [place] Office: [phone] Pager:  [phone]

## 2018-01-24 NOTE — Progress Notes (Signed)
Pt vomited small amount tan/brown bile. HR 160-190s with SBP >160 during vomiting episode. PRN zofran & metoprolol administered. Pt reports pain in bilateral lower extremities & initially refused pain meds. Pt constantly tossing/turning in bed; RN frequently reminding pt to keep right leg straight due to sheath. Oozing seen from dressing; no hematoma. Pressure held at site per order. No additional oozing at this time. Pt discomfort continues, pt ultimately requested pain medication (see MAR). HR 130-160s Afib. Vascular surgery paged for additional orders. Assessment ongoing.

## 2018-01-24 NOTE — Progress Notes (Signed)
eLink Physician-Brief Progress Note Patient Name: Willie CarmelGeorge A Morales DOB: 07/03/1960 MRN: [id number]   Date of Service  01/24/2018  HPI/Events of Note  Patient arrived ICU post-op agitated and appearing to be in pain. He was not appropriate for immediate extubation. Propofol infusion and PRN Fentanyl were started. Pt became more comfortable. A short while later he dropped his systolic blood pressure into the 60's and failed to respond immediately to stopping Propofol and giving a 1000 ml iv Lactated ringers fluid bolus. Epinephrine 50 mcg iv x 1 with a good blood pressure response. Pt's ventricular response rate transiently increased. A stat 12 lead EKG was requested.Dr. Minette HeadlandAljishi arrived to assume care and patient was turned over to him.  eICU Interventions  LR 1000 ml iv fluid bolus, Epinephrine 50 mcg iv x 1        [name] 01/24/2018, 9:27 PM

## 2018-01-24 NOTE — Progress Notes (Signed)
Dr. Chestine Sporelark, Vascular surgery, updated via phone. Orders to start cardizem gtt for target HR 100 & 25mg  Phenergan IV once. Assessment ongoing.

## 2018-01-24 NOTE — Op Note (Signed)
Patient name: Willie Morales MRN: [id number] DOB: 12/05/1960 Sex: male  01/24/2018 Pre-operative Diagnosis: Follow-up left leg lysis Post-operative diagnosis:  Same Surgeon:  Durene CalWells Chardonay Scritchfield Assistants: None Procedure:   #1: Catheter exchange for lytic therapy   #2: Left lower extremity angiogram   #3: Mechanical thrombectomy left femoral-popliteal bypass graft, tibioperoneal trunk, posterior tibial artery using penumbra CAT 6 device   #4: Balloon angioplasty left popliteal, tibioperoneal trunk, posterior tibial, peroneal artery   #5: Stent, left posterior tibial artery (synergy 3 x 32)   #6: Stent, left peroneal artery (synergy 3 x 24)   #7: Stent, left tibioperoneal trunk   #8: Stent, left common iliac artery and left external iliac artery (7 x 120 Elluvia)   #9: Intra-arterial administration of nitroglycerin   #10: Right femoral angiogram with closure device (Mynx)   #11: 4 compartment fasciotomy   #12: Application of wound VAC to fasciotomy sites Anesthesia: General Blood Loss:  100cc Specimens: None  Findings: Following mechanical thrombectomy of the bypass graft, there was still poor outflow I tried ballooning the tibioperoneal trunk, distal anastomosis, peroneal and posterior tibial artery however the patient still had poor outflow.  I did shoot injection selectively into the peroneal and posterior tibial artery and they did appear to be patent throughout the course however they are markedly spasmed.  I ultimately placed kissing stents extending up into the tibioperoneal trunk going into the posterior tibial and peroneal artery.  The patient still has very small tibial vessels with significant spasm.  There was significant inflow stenosis and so the external and common iliac lesions were treated with a 7 x 120 Elluvia stent.  After this portion of the procedure, the patient had a brisk posterior tibial Doppler signal.  I elected to perform fasciotomies, and the patient had a significant  compartment syndrome.  All muscle was viable.  Wound vacs were placed on the fasciotomy site.  Patient remained acidotic and so was kept intubated and taken to the ICU.  Indications: The patient initially presented with an ischemic left leg and occluded bypass graft.  Lytic therapy was initiated yesterday he is back today for follow-up study  Procedure:  The patient was identified in the holding area [facility]  The patient was then placed supine on the table. general anesthesia was administered.  The patient was prepped and draped in the usual sterile fashion.  A time out was called and antibiotics were administered.  A V-18 wire was advanced through the UniFuse catheter.  Left leg runoff was performed.  The bypass graft appeared to be patent however there was significant outflow disease.  Minimal opacification of the tibial vessels was encountered.  I then selected a penumbra CAT 6 device and performed mechanical thrombectomy of the femoral-popliteal bypass graft, tibioperoneal trunk, and posterior tibial artery.  Once this was done an angiogram was performed which showed inline flow down to the tibioperoneal trunk however the tibial vessels did not opacify.  I then inserted a 3 x 60 Sterling balloon and performed balloon angioplasty of the tibioperoneal trunk and posterior tibial artery.  Follow-up imaging again did not show any significant opacification of the tibial vessels.  I was concerned that this may be partly due to his inflow stenosis because the contrast injections were performed proximal to the stenosis within the iliac artery.  I therefore elected to stent the inflow.  I inserted a 7 x 120 Elluvia stent and dilated it with a 6  mm balloon.  The tandem 80% lesions within the common and external iliac artery were resolved with no residual stenosis.  I then performed a left leg angiogram and still had significant tibial disease.  I then inserted a second V-18 wire and directed into the  peroneal artery.  Balloon angioplasty of the posterior tibial and peroneal artery were then performed with a 3 mm balloon.  Subsequent imaging revealed slightly improved perfusion of the tibial vessels however they were in market spasm.  On 2 separate occasions I injected 600 mcg of nitroglycerin into the tibial vessels.  I still do not have good runoff with what appeared to be an abrupt cut off at the origin of the posterior tibial and peroneal artery.  Since this did not respond to balloon angioplasty, I felt that these 2 arteries needed to be stented.  I switched out for V-14 wires and placed a synergy 3 x 32 stent into the tibioperoneal trunk and posterior tibial artery and a synergy 3 x 28 coronary stent into the tibioperoneal trunk and peroneal artery.  These were then deployed.  After this there did appear to the better flow across the distal tibioperoneal trunk and into the stents however both tibial vessels were in market spasm.  I injected more nitroglycerin with the catheter in the tibioperoneal trunk.  Subsequent imaging did show better perfusion of the posterior tibial and peroneal artery.  At this point I felt that there was nothing more that could be done and that since I felt this was spasm hopefully it will resolve with removal of the wires.  At this point the wires were removed.  The long 7  French sheath was exchanged out for a short 7 [place] sheath and a minx device was used to close the groin.  I then fell to the calf and he had a very tense anterior lateral and posterior compartments.  I then made a lateral and medial incision and performed 4 compartment fasciotomies from the ankle to the knee.  The muscle was under significant tension, consistent with compartment syndrome.  All the muscle was viable.  I then placed a wound VAC over the fasciotomy sites.  The patient still had a brisk Doppler signal in the posterior tibial artery.  He also had a brisk Doppler signal and the femoral artery  distal to the closure device.  The patient remained relatively acidotic after his washout.  He was treated with calcium and bicarb.  He is going to remain intubated and transferred to the ICU in critical condition.   Disposition: To the ICU intubated in critical condition.   Juleen China, M.D. [facility] [place] Office: [phone] Pager:  [phone]

## 2018-01-24 NOTE — Transfer of Care (Signed)
Immediate Anesthesia Transfer of Care Note  Patient: Willie Morales  Procedure(s) Performed: MECHANICAL THROMBECTOMY USING PERNUMBRA WITH AORTOGRAM AND INTERVENTION (Left ) Lysis Re-check (Left ) FASCIOTOMY OF LOWER LEFT LEG (Left )  Patient Location: ICU  Anesthesia Type:General  Level of Consciousness: sedated and Patient remains intubated per anesthesia plan  Airway & Oxygen Therapy: Patient remains intubated per anesthesia plan and Patient placed on Ventilator (see vital sign flow sheet for setting)  Post-op Assessment: Report given to RN and Post -op Vital signs reviewed and unstable, Anesthesiologist notified  Post vital signs: Reviewed and unstable  Last Vitals:  Vitals Value Taken Time  BP 101/66 01/24/2018  8:03 PM  Temp    Pulse 64 01/24/2018  8:05 PM  Resp 17 01/24/2018  8:07 PM  SpO2 95 % 01/24/2018  8:05 PM  Vitals shown include unvalidated device data.  Last Pain:  Vitals:   01/24/18 1200  TempSrc: Oral  PainSc: 0-No pain      Patients Stated Pain Goal: 0 (01/24/18 1200)  Complications: No apparent anesthesia complications

## 2018-01-24 NOTE — Consult Note (Addendum)
Cardiology Consultation:   Patient ID: Willie Morales; [id number]; 09/16/1960   Admit date: 01/23/2018 Date of Consult: 01/24/2018  Primary Care Provider: Benita Morales, Willie Z, MD Primary Cardiologist: Willie BearsMuhammad Arida, MD   Chief Complaint: left leg pain  Patient Profile:   Willie Morales is a 57 y.o. male with a hx of nonobstructive CAD by cath 2009, atrial fibrillation (chronic per Willie Morales's notes), DM, HLD, HTN, carotid artery disease (60-70% RICA, totally occluded LICA 04/2017), extensive PAD who is being seen today for the evaluation of afib with RVR at the request of Willie Morales.  He has been admitted with acute on chronic limb ischemia.  History of Present Illness:   Regarding cardiac history, Willie Morales had a remote cath in 2009 showing 30% mRCA, 30% PDA, 45% LAD, 30% diag. He has been diagnosed with afib since at least 2012. He was in NSR at office visit in 2012. He appears to have been lost to follow-up until 2018 when he was back in afib. There have not been attempts to restore NSR and so Willie Morales deemed this likely chronic afib when he saw the patient back in 05/2017. He's been managed with rate control strategy since that time. 2D echo 02/2016 showed normal EF 60-65%, mild LVH, mild MR/TR, mildly reduced RV function and moderately dilated left atrium. He is also followed by [facility] for severe PAD with numerous lower extremity procedures. He'd previously undergone angioplasty/stenting to the lower extremities. In 04/2017 he underwent left iliofemoral endarterectomy/angioplasty and left common femoral artery to below-the-knee popliteal artery bypass. In 08/2017 he required repeat angioplasties, and late that month presented with occluded L femoropopliteal bypass graft and required thrombectomy and endarterectomy. He presented back to ER with worsening left leg pain for 2-3 days felt to represent acute on chronic limb ischemia. He went to the OR yesterday afternoon and had thrombolytics catheter  placement in the left common femoral to below-knee popliteal prosthetic bypass graft, and pharmacologic catheter thrombolysis of the left lower extremity. His LLE remains without motor or sensory function or doppler signal. He is to go back for LLE angio today. He had vomiting overnight and spike in his HR. At home he was on metoprolol 25mg  BID. This was continued. He got 2 doses of IV Lopressor earlier this morning, and was placed on a diltiazem drip. His HR is currently 100-110 on 10mg /hr of diltiazem. Labs reveal a peak leukocytosis of 19.4, normal Hgb, hyperglycemia up to 302, Cr 1.23. He denies any CP, SOB, edema, orthopnea. He is lying completely flat in bed without difficulty breathing. He's generally unaware of his afib. He was hypertensive overnight but is now normotensive. He does report a mild cough for the last few days as well but no fevers or chills. He has been a longstanding smoker and has recently been trying to quit with Chantix.O2 sat does intermittently dip per Epic but currently has normal O2 sat at rest.   Past Medical History:  Diagnosis Date  . Carotid artery disease (HCC)    a. 60-70% RICA, totally occluded LICA, > 50% stenosed bilat ECA 04/2017.  Marland Kitchen. Chronic atrial fibrillation   . Coronary artery disease    a. remote cath in 2009 showing 30% mRCA, 30% PDA, 45% LAD, 30% diag.  . Diabetes mellitus   . Dyslipidemia   . History of kidney stones   . Hypertension   . Tobacco abuse     Past Surgical History:  Procedure Laterality Date  . ABDOMINAL AORTOGRAM  W/LOWER EXTREMITY N/A 07/05/2016   Procedure: Abdominal Aortogram w/Lower Extremity;  Surgeon: Willie Hertz, MD;  Location: [facility] INVASIVE CV LAB;  Service: Cardiovascular;  Laterality: N/A;  . ABDOMINAL AORTOGRAM W/LOWER EXTREMITY N/A 05/06/2017   Procedure: ABDOMINAL AORTOGRAM W/LOWER EXTREMITY;  Surgeon: Willie Harman, MD;  Location: [facility] INVASIVE CV LAB;  Service: Cardiovascular;  Laterality: N/A;  . BACK SURGERY   1991  . CARDIAC CATHETERIZATION  07/31/2007   This showed nonobstructive coronary artery disease   He had 30% lesion in the mid RCA, 30% lesion in the PDA, 45% lesion in the LAD and mid  LAD, and 30% lesion in the diagonal.  The left circumflex was normal.  The LV function was 60%.     Marland Kitchen ENDARTERECTOMY FEMORAL Left 05/09/2017   Procedure: ENDARTERECTOMY FEMORAL WITH DACRON PATCH ANGIOPLASTY;  Surgeon: Willie Hertz, MD;  Location: [facility] OR;  Service: Vascular;  Laterality: Left;  . ENDARTERECTOMY POPLITEAL  09/17/2017   Procedure: ENDARTERECTOMY POPLITEAL ARTERY AND TIBIAL FIBULAR TRUNK;  Surgeon: Willie Hint, MD;  Location: [facility] OR;  Service: Vascular;;  . FEMORAL-POPLITEAL BYPASS GRAFT Left 05/09/2017   Procedure: BYPASS GRAFT FEMORAL-POPLITEAL ARTERY LEFT;  Surgeon: Willie Hertz, MD;  Location: [facility] OR;  Service: Vascular;  Laterality: Left;  . FEMORAL-POPLITEAL BYPASS GRAFT Left 09/17/2017   Procedure: THROMBECTOMY LEFT FEMORAL-POPLITEAL BYPASS GRAFT;  Surgeon: Willie Hint, MD;  Location: [facility] OR;  Service: Vascular;  Laterality: Left;  . KIDNEY STONE SURGERY  2011  . LOWER EXTREMITY ANGIOGRAM Left 01/23/2018   Procedure: LEFT LOWER EXTREMITY ANGIOGRAM, THROMBOLYSIS Catheter Placement;  Surgeon: Willie Shelling, MD;  Location: [facility] OR;  Service: Vascular;  Laterality: Left;  . LOWER EXTREMITY ANGIOGRAPHY N/A 12/06/2016   Procedure: Lower Extremity Angiography;  Surgeon: Willie Hertz, MD;  Location: [facility] INVASIVE CV LAB;  Service: Cardiovascular;  Laterality: N/A;  . LOWER EXTREMITY ANGIOGRAPHY N/A 09/05/2017   Procedure: LOWER EXTREMITY ANGIOGRAPHY;  Surgeon: Willie Hertz, MD;  Location: [facility] INVASIVE CV LAB;  Service: Cardiovascular;  Laterality: N/A;  . PATCH ANGIOPLASTY Left 05/09/2017   Procedure: PATCH ANGIOPLASTY WITH PROPATEN GRAFT  OF BELOW KNEE POPLITEAL ARTERY;  Surgeon: Willie Hertz, MD;  Location: [facility] OR;  Service: Vascular;  Laterality: Left;  . PATCH ANGIOPLASTY Left  09/17/2017   Procedure: PATCH ANGIOPLASTY WITH PERICARDIAL BOVINE PATCH OF DISTAL ANASTOMOSIS;  Surgeon: Willie Hint, MD;  Location: [facility] OR;  Service: Vascular;  Laterality: Left;  . PERIPHERAL VASCULAR ATHERECTOMY Right 07/19/2016   Procedure: Peripheral Vascular Atherectomy;  Surgeon: Willie Hertz, MD;  Location: [facility] INVASIVE CV LAB;  Service: Cardiovascular;  Laterality: Right;  . PERIPHERAL VASCULAR BALLOON ANGIOPLASTY Left 07/05/2016   Procedure: Peripheral Vascular Balloon Angioplasty;  Surgeon: Willie Hertz, MD;  Location: [facility] INVASIVE CV LAB;  Service: Cardiovascular;  Laterality: Left;  SFA  . PERIPHERAL VASCULAR BALLOON ANGIOPLASTY Left 09/05/2017   Procedure: PERIPHERAL VASCULAR BALLOON ANGIOPLASTY;  Surgeon: Willie Hertz, MD;  Location: [facility] INVASIVE CV LAB;  Service: Cardiovascular;  Laterality: Left;  Popliteal, profunda femoral, external iliac  . PERIPHERAL VASCULAR INTERVENTION  12/06/2016   Procedure: PERIPHERAL VASCULAR INTERVENTION;  Surgeon: Willie Hertz, MD;  Location: [facility] INVASIVE CV LAB;  Service: Cardiovascular;;  LEFT     Inpatient Medications: Scheduled Meds: . atorvastatin  80 mg Oral q1800  . benazepril  10 mg Oral Daily  . metoprolol tartrate  25 mg Oral BID  . sodium chloride flush  3 mL Intravenous Q12H  Continuous Infusions: . sodium chloride    . sodium chloride 125 mL/hr at 01/24/18 1005  . alteplase (LIMB ISCHEMIA) 10 mg in normal saline (0.02 mg/mL) infusion 1 mg/hr (01/24/18 0922)  . diltiazem (CARDIZEM) infusion 15 mg/hr (01/24/18 0900)  . heparin 500 Units/hr (01/24/18 0900)   PRN Meds: sodium chloride, hydrALAZINE, metoprolol tartrate, morphine injection, ondansetron (ZOFRAN) IV, sodium chloride flush  Home Meds: Prior to Admission medications   Medication Sig Start Date End Date Taking? Authorizing Provider  apixaban (ELIQUIS) 5 MG TABS tablet Take 1 tablet (5 mg total) by mouth 2 (two) times daily. 03/26/16   Jodelle Gross, NP    atorvastatin (LIPITOR) 10 MG tablet Take 10 mg by mouth daily.  11/12/16   [provider]  benazepril (LOTENSIN) 10 MG tablet Take 1 tablet (10 mg total) by mouth daily. 04/03/16   Jodelle Gross, NP  clopidogrel (PLAVIX) 75 MG tablet Take 1 tablet (75 mg total) by mouth daily. 09/20/17   Azalee Course, PA  gabapentin (NEURONTIN) 300 MG capsule Take 1 capsule (300 mg total) by mouth 3 (three) times daily. 10/07/17   Nada Libman, MD  glimepiride (AMARYL) 4 MG tablet Take 1 tablet (4 mg total) by mouth daily with breakfast. 03/19/16   Jodelle Gross, NP  HYDROcodone-acetaminophen (NORCO/VICODIN) 5-325 MG tablet Take 1 tablet by mouth every 6 (six) hours as needed for moderate pain. 05/29/17   Lars Mage, PA-C  levothyroxine (SYNTHROID, LEVOTHROID) 50 MCG tablet Take 25 mcg by mouth daily before breakfast.     [provider]  metoprolol tartrate (LOPRESSOR) 25 MG tablet TAKE 1 TABLET BY MOUTH TWICE DAILY 01/22/18   Iran Ouch, MD  sitaGLIPtin-metformin (JANUMET) 50-1000 MG tablet Take 1 tablet by mouth 2 (two) times daily.    [provider]  varenicline (CHANTIX CONTINUING MONTH PAK) 1 MG tablet Take 1 tablet (1 mg total) by mouth 2 (two) times daily. 09/25/17   Willie Hertz, MD  varenicline (CHANTIX) 0.5 MG tablet Take 0.5 mg by mouth daily for the first three days, then Take 0.5 mg by mouth twice a day for the next four days. 09/25/17   Willie Hertz, MD    Allergies:    Allergies  Allergen Reactions  . Ciprofloxacin Rash    Social History:   Social History   Socioeconomic History  . Marital status: Married    Spouse name: Not on file  . Number of children: Not on file  . Years of education: Not on file  . Highest education level: Not on file  Occupational History  . Occupation: Nutritional therapist    Comment: by trade  Social Needs  . Financial resource strain: Not on file  . Food insecurity:    Worry: Not on file    Inability: Not on file  .  Transportation needs:    Medical: Not on file    Non-medical: Not on file  Tobacco Use  . Smoking status: Current Every Day Smoker    Packs/day: 1.00    Years: 35.00    Pack years: 35.00    Types: Cigarettes  . Smokeless tobacco: Never Used  . Tobacco comment: 2-3 cigarettes per day  Substance and Sexual Activity  . Alcohol use: No  . Drug use: No  . Sexual activity: Not on file  Lifestyle  . Physical activity:    Days per week: Not on file    Minutes per session: Not on file  .  Stress: Not on file  Relationships  . Social connections:    Talks on phone: Not on file    Gets together: Not on file    Attends religious service: Not on file    Active member of club or organization: Not on file    Attends meetings of clubs or organizations: Not on file    Relationship status: Not on file  . Intimate partner violence:    Fear of current or ex partner: Not on file    Emotionally abused: Not on file    Physically abused: Not on file    Forced sexual activity: Not on file  Other Topics Concern  . Not on file  Social History Narrative   Has 2 children    Lives in [place] with wife   Currently unemployed    Family History:   The patient's family history includes Cancer in his mother; Coronary artery disease in his father; Diabetes in his maternal grandmother; Heart attack in his father; Heart disease in his brother, father, and mother; SIDS in his sister.  ROS:  Please see the history of present illness.  All other ROS reviewed and negative.     Physical Exam/Data:   Vitals:   01/24/18 0732 01/24/18 0800 01/24/18 0900 01/24/18 1000  BP: (!) 158/53 (!) 153/123 127/84 129/89  Pulse: (!) 108 (!) 147 88 (!) 112  Resp:  (!) 35 15 (!) 31  Temp: 97.9 F (36.6 C)     TempSrc: Oral     SpO2: (!) 88% 94% 94% 93%  Weight:      Height:        Intake/Output Summary (Last 24 hours) at 01/24/2018 1156 Last data filed at 01/24/2018 1005 Gross per 24 hour  Intake 3904 ml    Output 2575 ml  Net 1329 ml   Filed Weights   01/23/18 1738  Weight: 78.8 kg   Body mass index is 22.92 kg/m.  General: Well developed, well nourished WM, in no acute distress. Lying flat in bed without dyspnea Head: Normocephalic, atraumatic, sclera non-icteric, no xanthomas, nares are without discharge.  Neck: JVD not elevated. Lungs: Coarse BS with rare exp rhonchi. No wheezing or rales. Breathing is unlabored. Heart: Irregularly irregular, borderline tachycardic, with S1 S2. No murmurs, rubs, or gallops appreciated. Abdomen: Soft, non-tender, non-distended with normoactive bowel sounds. No hepatomegaly. No rebound/guarding. No obvious abdominal masses. Msk:  Strength and tone appear normal for age. Extremities:  No edema. LLE without palpable pulse, LLE appears mottled Neuro: Alert and oriented X 3. No facial asymmetry.  Psych:  Responds to questions appropriately with a normal affect.  EKG:  The EKG was personally reviewed and demonstrates afib 87bpm, nonspecific ST-T changes  Laboratory Data:  Chemistry Recent Labs  Lab 01/23/18 0908 01/23/18 0923 01/24/18 0826  NA 136 139 138  K 4.0 3.9 4.1  CL 103 104 103  CO2 22  --  14*  GLUCOSE 233* 229* 302*  BUN 17 21* 12  CREATININE 1.05 1.00 1.23  CALCIUM 9.8  --  9.2  GFRNONAA >60  --  >60  GFRAA >60  --  >60  ANIONGAP 11  --  21*    Recent Labs  Lab 01/23/18 0908  PROT 8.3*  ALBUMIN 4.7  AST 24  ALT 20  ALKPHOS 73  BILITOT 1.3*   Hematology Recent Labs  Lab 01/23/18 1456 01/23/18 2042 01/24/18 0326  WBC 13.1* 19.4* 16.3*  RBC 4.77 4.99 4.91  HGB 13.6  14.4 14.1  HCT 43.1 45.6 44.8  MCV 90.4 91.4 91.2  MCH 28.5 28.9 28.7  MCHC 31.6 31.6 31.5  RDW 13.3 13.5 13.7  PLT 146* 170 171   Radiology/Studies:  No results found.  Assessment and Plan:   1.  Acute on chronic limb ischemia of the left lower extremity with thrombosed common femoral to below-knee popliteal prosthetic bypass - on IV blood  thinners per vascular team with plan to return to lab for angio again today.  2. Chronic atrial fibrillation with rapid ventricular response - given that afib is chronic, his tachycardic response is a similar mechanism to sinus tach in patients who are in normal rhythm - likely driven by adrenergic stress of acute illness. TSH wnl. HR presently 100-110 which seems physiologically appropriate for degree of physiologic stress. There is room to titrate dilt if needed. Can also consider titrating metoprolol to q6hr. Will review optimal plan with MD.  3. Cough with longstanding tobacco abuse - he has been working on quitting with Chantix. Afebrile. Would suggest portable CXR for baseline.  4. HTN - he was hypertensive overnight but BP now normal. He is on benazepril. If low BP becomes an issue going forward, can hold this to allow more BP room to titrate AVN blocking agents.  5. H/o mild CAD - no angina reported. Continue BB. Given significant PAD history it is not clear why he is only on low dose statin. Given his DM and PAD would anticipate that high intensity statin is indicated. AST/ALT wnl. Will initiate atorvastatin 80mg  qpm. If the patient is tolerating statin at time of follow-up appointment, would consider rechecking liver function/lipid panel in 6-8 weeks. Addendum: f/u EKG shows inferior ST sagging and downsloped ST depression V5-V6. He also has precordial peaked TW but these have been apparent on prior tracings. The ST depression was not seen on EKGs when HR was controlled, but similar when presenting with high rates in the past. Pt is not complaining of any angina but will review further eval with MD.  6. Hypomagnesemia  - ordered by vascular team, will replete with 2g mag sulfate. F/u Mg in AM. Further management of lytes per primary team.  7. Uncontrolled DM - needs improved control and strict OP follow-up. Home meds not resumed - will defer to primary team to manage.  For questions or  updates, please contact [facility] Please consult [email or web address] for contact info under Cardiology/STEMI.    Signed, Laurann Montana, PA-C  01/24/2018 11:56 AM

## 2018-01-24 NOTE — Progress Notes (Addendum)
Vascular MD on  Call paged to make aware of increased bleeding @ right groin site. Re dressed and dressing appears clean and dry. Pulses via doppler unchanged. Pt instructed on importance of keeping the leg straight to which he replied " if youre going to bug me then I just will leave". Continued education, emotional support, re assesment.  Pt continues to be in rapid afib, cardizem rate increased. SBP 160's.   Noted peaked t waves, bmp drawn. Cards aware. No cardiac sx.  Mg 1.5, replaced.

## 2018-01-24 NOTE — Anesthesia Procedure Notes (Signed)
Procedure Name: Intubation Performed by: Ezekiel InaBotts, Aarion Kittrell H, CRNA Pre-anesthesia Checklist: Patient identified, Emergency Drugs available, Suction available and Patient being monitored Patient Re-evaluated:Patient Re-evaluated prior to induction Oxygen Delivery Method: Circle System Utilized Preoxygenation: Pre-oxygenation with 100% oxygen Induction Type: IV induction Ventilation: Unable to mask ventilate Laryngoscope Size: Miller and 3 Grade View: Grade I Tube type: Oral Tube size: 7.5 mm Number of attempts: 1 Airway Equipment and Method: Stylet Placement Confirmation: ETT inserted through vocal cords under direct vision,  positive ETCO2 and breath sounds checked- equal and bilateral Secured at: 24 cm Tube secured with: Tape Dental Injury: Teeth and Oropharynx as per pre-operative assessment  Comments: Difficult mask due to beard and dentition

## 2018-01-24 NOTE — Progress Notes (Signed)
Inpatient Diabetes Program Recommendations  AACE/ADA: New Consensus Statement on Inpatient Glycemic Control (2015)  Target Ranges:  Prepandial:   less than 140 mg/dL      Peak postprandial:   less than 180 mg/dL (1-2 hours)      Critically ill patients:  140 - 180 mg/dL   Lab Results  Component Value Date   GLUCAP 231 (H) 01/23/2018   HGBA1C 7.6 (H) 01/24/2018    Review of Glycemic Control  Diabetes history: Type 2 Outpatient Diabetes medications: Amaryl, Janumet Current orders for Inpatient glycemic control: none  Inpatient Diabetes Program Recommendations:    Noted that blood sugars have been greater than 180 mg/dl. Recommend Novolog SENSITIVE (0-9 units) every 4 hours while NPO and then TID & HS if blood sugars continue to be elevated while in the hospital.  Smith MinceKendra Charlot Gouin RN BSN CDE Diabetes Coordinator Pager: [phone]  8am-5pm

## 2018-01-24 NOTE — Progress Notes (Signed)
Pt asleep, no further vomiting episodes at this time. HR 115-140s. Vascular surgery paged again. Assessment ongoing.

## 2018-01-24 NOTE — Consult Note (Signed)
PULMONARY / CRITICAL CARE MEDICINE   NAME:  Willie Morales, MRN:  [id number], DOB:  [birth year], LOS: 1 ADMISSION DATE:  01/23/2018, CONSULTATION DATE:  01/24/2018 REFERRING MD:  Dr Myra Gianotti , CHIEF COMPLAINT:  resp failure   BRIEF HISTORY:    Willie Morales 57 year old male with history of peripheral vascular disease and popliteal bypass coming in with thrombosed graft status post thrombectomy and multiple stents.  HISTORY OF PRESENT ILLNESS   Willie Morales is a 57 year old male with significant peripheral vascular disease who was admitted to the hospital on 5 December with a 3-day history of left foot pain and numbness patient is known with atrial fibrillation on Eliquis hypertension hyperlipidemia.  Patient is also known with peripheral vascular disease with below-knee popliteal bypass which was thrombosed and underwent below-knee cutdown with graft thrombectomy and patch angioplasty in July 2019 coming in with similar symptoms and was taken tonight to the operation room for mechanical thrombectomy of the left femoropopliteal bypass graft plus balloon angioplasty with multiple stents to the left posterior tibial artery, left peroneal artery, left tibioperoneal trunk, left common iliac artery and left external iliac artery patient also underwent compartment fasciotomy due to high risk for compartment syndrome.  Patient came back from the operating room intubated he is hypotensive just received epinephrine on his way to the ICU and now he is severely tachycardic in the 200s with atrial fibrillation with rapid ventricular response patient also has some frothy secretions coming out of his endotracheal tube suggestive of pulmonary edema.  Patient is waking up and being agitated trying to get out of bed and pulled his tube.  Obviously not able to give any history due to him being intubated and most of the information was taken from the chart.  We were consulted due to high risk for further deterioration including worsening  renal function and rhabdomyolysis and compartment syndrome also for management of his ventilator.  SIGNIFICANT PAST MEDICAL HISTORY   -Tobacco abuse -Diabetes mellitus -Atrial fibrillation on Eliquis -Hypertension -Peripheral vascular disease status post below-knee popliteal bypass March 2019 -Thrombosed graft underwent below-knee popliteal cutdown and graft thrombectomy July 2019  SIGNIFICANT EVENTS:  -Operation room 01/24/2018 -Intubation 01/24/2018  STUDIES:    CULTURES:    ANTIBIOTICS:    LINES/TUBES:    CONSULTANTS:   SUBJECTIVE:    CONSTITUTIONAL: BP (!) 142/90   Pulse 78   Temp 99 F (37.2 C) (Oral)   Resp 16   Ht 6\' 1"  (1.854 m)   Wt 78.8 kg   SpO2 96%   BMI 22.92 kg/m   I/O last 3 completed shifts: In: 6406.2 [P.O.:240; I.V.:5416.2; IV Piggyback:750] Out: 3425 [Urine:2860; Emesis/NG output:150; Blood:415]     Vent Mode: PRVC FiO2 (%):  [60 %] 60 % Set Rate:  [16 bmp] 16 bmp Vt Set:  [600 mL] 600 mL PEEP:  [5 cmH20] 5 cmH20 Plateau Pressure:  [8 cmH20] 8 cmH20  PHYSICAL EXAM: General: Intubated acutely ill on mechanical ventilation agitated trying to get out of bed Neuro: Moving all extremities power 5/5 all over following commands cataract in the left eye HEENT: Intubated Cardiovascular: Atrial fibrillation irregularly irregular tachycardic Lungs: Coarse crackles bilaterally no wheezing or dullness Abdomen: Soft no tenderness organomegaly Musculoskeletal: No lower limb edema patient has some mottled left leg with fasciotomy in his left lower limb pulses nonpalpable Skin: No rash  RESOLVED PROBLEM LIST   ASSESSMENT AND PLAN   Assessment: - Acute hypoxemic respiratory failure requiring mechanical ventilation -Acute pulmonary edema -Atrial fibrillation with  rapid ventricular response -Hypotension -Peripheral vascular disease with thrombosed graft status post thrombectomy and multiple stents -Acute kidney injury  Plan: -Bolusing with  albumin -Patient is hypotensive unfortunately due to his atrial fibrillation with rapid ventricular response we are limited with options to control his blood pressure I will start patient on phenylephrine -Stat CK level -Stat chest x-ray -Sedated with Versed and fentanyl since they might be easier in his blood pressure then propofol -Unfortunately patient is not ready for extubation postoperatively due to his hypoxia patient is on 60% FiO2 and is in severe pulmonary edema -Adjust mechanical ventilation use lung protective strategy keep pulse ox above 94% -Follow serial BMP and urine output patient is high risk for rhabdomyolysis  I have spent 55 minutes of critical care time this time was spent bedside or in the unit this time was exclusive any billable procedures patient is needing to scan due to acute hypoxemic respiratory failure require mechanical ventilation  SUMMARY OF TODAY'S PLAN:    Best Practice / Goals of Care / Disposition.   DVT PROPHYLAXIS: As per surgery SUP: PPI NUTRITION: N.p.o. MOBILITY:*Bedrest GOALS OF CARE: Full code FAMILY DISCUSSIONS: No family around DISPOSITION ICU admission  LABS  Glucose Recent Labs  Lab 01/23/18 1122 01/23/18 1655 01/23/18 2035 01/24/18 1733  GLUCAP 189* 167* 231* 237*    BMET Recent Labs  Lab 01/23/18 0908 01/23/18 0923 01/24/18 0826 01/24/18 1810  NA 136 139 138 138  K 4.0 3.9 4.1 4.7  CL 103 104 103  --   CO2 22  --  14*  --   BUN 17 21* 12  --   CREATININE 1.05 1.00 1.23  --   GLUCOSE 233* 229* 302*  --     Liver Enzymes Recent Labs  Lab 01/23/18 0908  AST 24  ALT 20  ALKPHOS 73  BILITOT 1.3*  ALBUMIN 4.7    Electrolytes Recent Labs  Lab 01/23/18 0908 01/24/18 0826 01/24/18 1042  CALCIUM 9.8 9.2  --   MG  --   --  1.5*    CBC Recent Labs  Lab 01/24/18 0326 01/24/18 1043 01/24/18 1810 01/24/18 2035  WBC 16.3* 18.9*  --  20.9*  HGB 14.1 14.2 12.9* 10.3*  HCT 44.8 44.4 38.0* 33.0*  PLT 171  105*  --  114*    ABG Recent Labs  Lab 01/24/18 1810  PHART 7.243*  PCO2ART 38.7  PO2ART 98.0    Coag's Recent Labs  Lab 01/23/18 0908 01/23/18 2042 01/24/18 0326 01/24/18 1253  APTT  --  35 32 29  INR 1.24  --   --   --     Sepsis Markers No results for input(s): LATICACIDVEN, PROCALCITON, O2SATVEN in the last 168 hours.  Cardiac Enzymes No results for input(s): TROPONINI, PROBNP in the last 168 hours.  PAST MEDICAL HISTORY :   He  has a past medical history of Carotid artery disease (HCC), Chronic atrial fibrillation, Coronary artery disease, Diabetes mellitus, Dyslipidemia, History of kidney stones, Hypertension, and Tobacco abuse.  PAST SURGICAL HISTORY:  He  has a past surgical history that includes Back surgery (1991); Kidney stone surgery (2011); Cardiac catheterization (07/31/2007); ABDOMINAL AORTOGRAM W/LOWER EXTREMITY (N/A, 07/05/2016); PERIPHERAL VASCULAR BALLOON ANGIOPLASTY (Left, 07/05/2016); PERIPHERAL VASCULAR ATHERECTOMY (Right, 07/19/2016); Lower Extremity Angiography (N/A, 12/06/2016); PERIPHERAL VASCULAR INTERVENTION (12/06/2016); ABDOMINAL AORTOGRAM W/LOWER EXTREMITY (N/A, 05/06/2017); Femoral-popliteal Bypass Graft (Left, 05/09/2017); Endarterectomy femoral (Left, 05/09/2017); Patch angioplasty (Left, 05/09/2017); Lower Extremity Angiography (N/A, 09/05/2017); PERIPHERAL VASCULAR BALLOON ANGIOPLASTY (Left, 09/05/2017); Femoral-popliteal  Bypass Graft (Left, 09/17/2017); Patch angioplasty (Left, 09/17/2017); Endarterectomy popliteal (09/17/2017); and lower extremity angiogram (Left, 01/23/2018).  Allergies  Allergen Reactions  . Ciprofloxacin Rash    No current facility-administered medications on file prior to encounter.    Current Outpatient Medications on File Prior to Encounter  Medication Sig  . apixaban (ELIQUIS) 5 MG TABS tablet Take 1 tablet (5 mg total) by mouth 2 (two) times daily.  Marland Kitchen atorvastatin (LIPITOR) 10 MG tablet Take 10 mg by mouth daily.   .  benazepril (LOTENSIN) 10 MG tablet Take 1 tablet (10 mg total) by mouth daily.  . clopidogrel (PLAVIX) 75 MG tablet Take 1 tablet (75 mg total) by mouth daily.  Marland Kitchen gabapentin (NEURONTIN) 300 MG capsule Take 1 capsule (300 mg total) by mouth 3 (three) times daily.  Marland Kitchen glimepiride (AMARYL) 4 MG tablet Take 1 tablet (4 mg total) by mouth daily with breakfast.  . HYDROcodone-acetaminophen (NORCO/VICODIN) 5-325 MG tablet Take 1 tablet by mouth every 6 (six) hours as needed for moderate pain.  Marland Kitchen levothyroxine (SYNTHROID, LEVOTHROID) 50 MCG tablet Take 25 mcg by mouth daily before breakfast.   . metoprolol tartrate (LOPRESSOR) 25 MG tablet TAKE 1 TABLET BY MOUTH TWICE DAILY  . sitaGLIPtin-metformin (JANUMET) 50-1000 MG tablet Take 1 tablet by mouth 2 (two) times daily.  . varenicline (CHANTIX CONTINUING MONTH PAK) 1 MG tablet Take 1 tablet (1 mg total) by mouth 2 (two) times daily.  . varenicline (CHANTIX) 0.5 MG tablet Take 0.5 mg by mouth daily for the first three days, then Take 0.5 mg by mouth twice a day for the next four days.    FAMILY HISTORY:   His family history includes Cancer in his mother; Coronary artery disease in his father; Diabetes in his maternal grandmother; Heart attack in his father; Heart disease in his brother, father, and mother; SIDS in his sister.  SOCIAL HISTORY:  He  reports that he has been smoking cigarettes. He has a 35.00 pack-year smoking history. He has never used smokeless tobacco. He reports that he does not drink alcohol or use drugs.  REVIEW OF SYSTEMS:    Could not be obtained due to sedation

## 2018-01-24 NOTE — Anesthesia Preprocedure Evaluation (Signed)
Anesthesia Evaluation  Patient identified by MRN, date of birth, ID band Patient awake    Reviewed: Allergy & Precautions, NPO status , Patient's Chart, lab work & pertinent test results  History of Anesthesia Complications Negative for: history of anesthetic complications  Airway Mallampati: I  TM Distance: >3 FB Neck ROM: Full    Dental  (+) Edentulous Upper, Loose, Dental Advisory Given,    Pulmonary Current Smoker,    breath sounds clear to auscultation + decreased breath sounds      Cardiovascular hypertension, Pt. on medications and Pt. on home beta blockers + CAD and + Peripheral Vascular Disease  + dysrhythmias Atrial Fibrillation  Rhythm:Irregular Rate:Tachycardia  Study Conclusions  - Left ventricle: The cavity size was normal. Wall thickness was   increased in a pattern of mild LVH. Systolic function was normal.   The estimated ejection fraction was in the range of 60% to 65%.   Wall motion was normal; there were no regional wall motion   abnormalities. The study was not technically sufficient to allow   evaluation of LV diastolic dysfunction due to atrial   fibrillation. - Aortic valve: Mildly calcified annulus. Trileaflet; mildly   thickened leaflets. - Mitral valve: Mildly thickened leaflets . There was mild   regurgitation. - Left atrium: The atrium was moderately dilated. - Right ventricle: Systolic function was mildly reduced. - Tricuspid valve: There was mild regurgitation.   Neuro/Psych negative neurological ROS  negative psych ROS   GI/Hepatic negative GI ROS, Neg liver ROS,   Endo/Other  diabetes, Type 2, Oral Hypoglycemic Agents  Renal/GU negative Renal ROS     Musculoskeletal negative musculoskeletal ROS (+)   Abdominal Normal abdominal exam  (+)   Peds  Hematology negative hematology ROS (+)   Anesthesia Other Findings   Reproductive/Obstetrics                              Lab Results  Component Value Date   WBC 18.9 (H) 01/24/2018   HGB 14.2 01/24/2018   HCT 44.4 01/24/2018   MCV 92.5 01/24/2018   PLT 105 (L) 01/24/2018   Lab Results  Component Value Date   CREATININE 1.23 01/24/2018   BUN 12 01/24/2018   NA 138 01/24/2018   K 4.1 01/24/2018   CL 103 01/24/2018   CO2 14 (L) 01/24/2018   Lab Results  Component Value Date   INR 1.24 01/23/2018   INR 1.13 09/17/2017   INR 1.11 05/09/2017   EKG: atrial fibrillation.   Anesthesia Physical  Anesthesia Plan  ASA: III  Anesthesia Plan: General   Post-op Pain Management:    Induction: Intravenous  PONV Risk Score and Plan: 2 and Ondansetron and Treatment may vary due to age or medical condition  Airway Management Planned: Oral ETT  Additional Equipment:   Intra-op Plan:   Post-operative Plan: Extubation in OR  Informed Consent: I have reviewed the patients History and Physical, chart, labs and discussed the procedure including the risks, benefits and alternatives for the proposed anesthesia with the patient or authorized representative who has indicated his/her understanding and acceptance.   Dental advisory given  Plan Discussed with: CRNA and Anesthesiologist  Anesthesia Plan Comments:         Anesthesia Quick Evaluation

## 2018-01-24 NOTE — Progress Notes (Addendum)
OR transport at bedside to get patient for surgery  [facility]

## 2018-01-25 DIAGNOSIS — E11649 Type 2 diabetes mellitus with hypoglycemia without coma: Secondary | ICD-10-CM

## 2018-01-25 DIAGNOSIS — Z720 Tobacco use: Secondary | ICD-10-CM

## 2018-01-25 DIAGNOSIS — I739 Peripheral vascular disease, unspecified: Secondary | ICD-10-CM

## 2018-01-25 DIAGNOSIS — I9589 Other hypotension: Secondary | ICD-10-CM

## 2018-01-25 DIAGNOSIS — E7800 Pure hypercholesterolemia, unspecified: Secondary | ICD-10-CM

## 2018-01-25 LAB — CBC
HCT: 29.8 % — ABNORMAL LOW (ref 39.0–52.0)
Hemoglobin: 9.8 g/dL — ABNORMAL LOW (ref 13.0–17.0)
MCH: 30 pg (ref 26.0–34.0)
MCHC: 32.9 g/dL (ref 30.0–36.0)
MCV: 91.1 fL (ref 80.0–100.0)
Platelets: 98 10*3/uL — ABNORMAL LOW (ref 150–400)
RBC: 3.27 MIL/uL — ABNORMAL LOW (ref 4.22–5.81)
RDW: 13.9 % (ref 11.5–15.5)
WBC: 17.6 10*3/uL — ABNORMAL HIGH (ref 4.0–10.5)
nRBC: 0 % (ref 0.0–0.2)

## 2018-01-25 LAB — GLUCOSE, CAPILLARY
Glucose-Capillary: 120 mg/dL — ABNORMAL HIGH (ref 70–99)
Glucose-Capillary: 142 mg/dL — ABNORMAL HIGH (ref 70–99)
Glucose-Capillary: 163 mg/dL — ABNORMAL HIGH (ref 70–99)
Glucose-Capillary: 174 mg/dL — ABNORMAL HIGH (ref 70–99)
Glucose-Capillary: 241 mg/dL — ABNORMAL HIGH (ref 70–99)
Glucose-Capillary: 263 mg/dL — ABNORMAL HIGH (ref 70–99)
Glucose-Capillary: 291 mg/dL — ABNORMAL HIGH (ref 70–99)

## 2018-01-25 LAB — COMPREHENSIVE METABOLIC PANEL
ALT: 60 U/L — ABNORMAL HIGH (ref 0–44)
AST: 218 U/L — ABNORMAL HIGH (ref 15–41)
Albumin: 3.8 g/dL (ref 3.5–5.0)
Alkaline Phosphatase: 37 U/L — ABNORMAL LOW (ref 38–126)
Anion gap: 16 — ABNORMAL HIGH (ref 5–15)
BUN: 13 mg/dL (ref 6–20)
CO2: 16 mmol/L — ABNORMAL LOW (ref 22–32)
Calcium: 8.8 mg/dL — ABNORMAL LOW (ref 8.9–10.3)
Chloride: 108 mmol/L (ref 98–111)
Creatinine, Ser: 1.29 mg/dL — ABNORMAL HIGH (ref 0.61–1.24)
GFR calc Af Amer: >60 mL/min (ref >60)
GFR calc non Af Amer: >60 mL/min (ref >60)
Glucose, Bld: 283 mg/dL — ABNORMAL HIGH (ref 70–99)
Potassium: 3.6 mmol/L (ref 3.5–5.1)
Sodium: 140 mmol/L (ref 135–145)
Total Bilirubin: 1.9 mg/dL — ABNORMAL HIGH (ref 0.3–1.2)
Total Protein: 5.8 g/dL — ABNORMAL LOW (ref 6.5–8.1)

## 2018-01-25 LAB — CK: Total CK: 9708 U/L — ABNORMAL HIGH (ref 49–397)

## 2018-01-25 LAB — TROPONIN I
Troponin I: 1.57 ng/mL (ref <0.03)
Troponin I: 1.49 ng/mL (ref <0.03)

## 2018-01-25 LAB — LIPID PANEL
Cholesterol: 116 mg/dL (ref 0–200)
HDL: 19 mg/dL — ABNORMAL LOW (ref >40)
LDL Cholesterol: 70 mg/dL (ref 0–99)
Total CHOL/HDL Ratio: 6.1 RATIO
Triglycerides: 133 mg/dL (ref <150)
VLDL: 27 mg/dL (ref 0–40)

## 2018-01-25 LAB — APTT
aPTT: 34 seconds (ref 24–36)
aPTT: 50 seconds — ABNORMAL HIGH (ref 24–36)
aPTT: 56 seconds — ABNORMAL HIGH (ref 24–36)

## 2018-01-25 LAB — HEPARIN LEVEL (UNFRACTIONATED): Heparin Unfractionated: 0.23 IU/mL — ABNORMAL LOW (ref 0.30–0.70)

## 2018-01-25 LAB — MAGNESIUM: Magnesium: 1.9 mg/dL (ref 1.7–2.4)

## 2018-01-25 MED ORDER — INSULIN ASPART 100 UNIT/ML ~~LOC~~ SOLN
0.0000 [IU] | SUBCUTANEOUS | Status: DC
  Administered 2018-01-25 (×2): 3 [IU] via SUBCUTANEOUS
  Administered 2018-01-25: 4 [IU] via SUBCUTANEOUS
  Administered 2018-01-25: 8 [IU] via SUBCUTANEOUS
  Administered 2018-01-26: 2 [IU] via SUBCUTANEOUS
  Administered 2018-01-26: 5 [IU] via SUBCUTANEOUS
  Administered 2018-01-26 (×2): 3 [IU] via SUBCUTANEOUS
  Administered 2018-01-26: 2 [IU] via SUBCUTANEOUS
  Administered 2018-01-27 (×4): 3 [IU] via SUBCUTANEOUS
  Administered 2018-01-27: 5 [IU] via SUBCUTANEOUS
  Administered 2018-01-27 – 2018-01-28 (×3): 3 [IU] via SUBCUTANEOUS
  Administered 2018-01-28: 2 [IU] via SUBCUTANEOUS
  Administered 2018-01-28: 3 [IU] via SUBCUTANEOUS
  Administered 2018-01-28: 2 [IU] via SUBCUTANEOUS
  Administered 2018-01-28 – 2018-01-29 (×3): 3 [IU] via SUBCUTANEOUS
  Administered 2018-01-29 (×2): 2 [IU] via SUBCUTANEOUS
  Administered 2018-01-29: 3 [IU] via SUBCUTANEOUS
  Administered 2018-01-30 (×2): 5 [IU] via SUBCUTANEOUS
  Administered 2018-01-30: 2 [IU] via SUBCUTANEOUS
  Administered 2018-01-31: 3 [IU] via SUBCUTANEOUS
  Administered 2018-01-31: 5 [IU] via SUBCUTANEOUS
  Administered 2018-01-31 (×2): 3 [IU] via SUBCUTANEOUS

## 2018-01-25 MED ORDER — INSULIN GLARGINE 100 UNIT/ML ~~LOC~~ SOLN
15.0000 [IU] | Freq: Every day | SUBCUTANEOUS | Status: DC
  Administered 2018-01-25 – 2018-01-30 (×5): 15 [IU] via SUBCUTANEOUS
  Filled 2018-01-25 (×8): qty 0.15

## 2018-01-25 MED ORDER — GABAPENTIN 300 MG PO CAPS
300.0000 mg | ORAL_CAPSULE | Freq: Three times a day (TID) | ORAL | Status: DC
  Administered 2018-01-25 – 2018-01-26 (×5): 300 mg via ORAL
  Filled 2018-01-25 (×6): qty 1

## 2018-01-25 MED ORDER — VITAL HIGH PROTEIN PO LIQD: 1000.0000 mL | ORAL | Status: DC

## 2018-01-25 MED ORDER — HALOPERIDOL LACTATE 5 MG/ML IJ SOLN
1.0000 mg | INTRAMUSCULAR | Status: DC | PRN
  Administered 2018-01-25 – 2018-01-26 (×5): 4 mg via INTRAVENOUS
  Filled 2018-01-25 (×5): qty 1

## 2018-01-25 MED ORDER — SODIUM CHLORIDE 0.9 % IV SOLN
0.1500 mg/kg/h | INTRAVENOUS | Status: AC
  Administered 2018-01-25 – 2018-01-30 (×5): 0.15 mg/kg/h via INTRAVENOUS
  Filled 2018-01-25 (×8): qty 250

## 2018-01-25 MED ORDER — VITAL AF 1.2 CAL PO LIQD
1000.0000 mL | ORAL | Status: DC
  Administered 2018-01-25: 1000 mL

## 2018-01-25 MED ORDER — CHLORHEXIDINE GLUCONATE 0.12% ORAL RINSE (MEDLINE KIT)
15.0000 mL | Freq: Two times a day (BID) | OROMUCOSAL | Status: DC
  Administered 2018-01-25 – 2018-01-29 (×7): 15 mL via OROMUCOSAL

## 2018-01-25 MED ORDER — ORAL CARE MOUTH RINSE
15.0000 mL | OROMUCOSAL | Status: DC
  Administered 2018-01-25 – 2018-01-28 (×21): 15 mL via OROMUCOSAL

## 2018-01-25 MED ORDER — VITAL AF 1.2 CAL PO LIQD: 1000.0000 mL | ORAL | Status: DC

## 2018-01-25 MED ORDER — LEVOTHYROXINE SODIUM 25 MCG PO TABS
25.0000 ug | ORAL_TABLET | Freq: Every day | ORAL | Status: DC
  Administered 2018-01-26 – 2018-01-31 (×5): 25 ug via ORAL
  Filled 2018-01-25 (×6): qty 1

## 2018-01-25 MED ORDER — CEPHALEXIN 500 MG PO CAPS
500.0000 mg | ORAL_CAPSULE | Freq: Three times a day (TID) | ORAL | Status: DC
  Administered 2018-01-25 – 2018-01-31 (×18): 500 mg via ORAL
  Filled 2018-01-25 (×19): qty 1

## 2018-01-25 NOTE — Progress Notes (Addendum)
ANTICOAGULATION CONSULT NOTE - Initial Consult  Pharmacy Consult for Bivalirudin Indication: atrial fibrillation and thrombectomy  Allergies  Allergen Reactions  . Ciprofloxacin Rash    Patient Measurements: Height: 6\' 1"  (185.4 cm) Weight: 173 lb 11.6 oz (78.8 kg) IBW/kg (Calculated) : 79.9 Heparin Dosing Weight: 78.8 kg  Vital Signs: Temp: 98.3 F (36.8 C) (12/07 1117) Temp Source: Axillary (12/07 1117) BP: 98/71 (12/07 1315) Pulse Rate: 60 (12/07 1315)  Labs: Recent Labs    01/23/18 0908 01/23/18 0923  01/24/18 0326 01/24/18 0826 01/24/18 1043 01/24/18 1253 01/24/18 1810 01/24/18 2035 01/24/18 2343 01/25/18 0350 01/25/18 0630 01/25/18 1000 01/25/18 1238  HGB 15.0 16.7   < > 14.1  --  14.2  --  12.9* 10.3*  --  9.8*  --   --   --   HCT 46.4 49.0   < > 44.8  --  44.4  --  38.0* 33.0*  --  29.8*  --   --   --   PLT 160  --    < > 171  --  105*  --   --  114*  --  98*  --   --   --   APTT  --   --    < > 32  --   --  29  --   --   --  34  --   --  50*  LABPROT 15.5*  --   --   --   --   --   --   --   --   --   --   --   --   --   INR 1.24  --   --   --   --   --   --   --   --   --   --   --   --   --   HEPARINUNFRC  --   --    < > 0.60  --   --  <0.10*  --   --   --   --  0.23*  --   --   CREATININE 1.05 1.00  --   --  1.23  --   --   --   --   --  1.29*  --   --   --   CKTOTAL  --   --   --   --   --   --   --   --   --  9,708*  --   --   --   --   TROPONINI  --   --   --   --   --   --   --   --   --   --   --   --  1.57*  --    < > = values in this interval not displayed.    Estimated Creatinine Clearance: 72.1 mL/min (A) (by C-G formula based on SCr of 1.29 mg/dL (H)).   Medical History: Past Medical History:  Diagnosis Date  . Carotid artery disease (HCC)    a. 60-70% RICA, totally occluded LICA, > 50% stenosed bilat ECA 04/2017.  Marland Kitchen Chronic atrial fibrillation   . Coronary artery disease    a. remote cath in 2009 showing 30% mRCA, 30% PDA, 45% LAD,  30% diag.  . Diabetes mellitus   . Dyslipidemia   . History of kidney stones   . Hypertension   . Tobacco abuse  Medications:  Infusions:  . sodium chloride    . sodium chloride 100 mL/hr at 01/25/18 1300  . sodium chloride    . sodium chloride Stopped (01/24/18 2042)  . acetaminophen    . amiodarone 30 mg/hr (01/25/18 1300)  . bivalirudin (ANGIOMAX) infusion 0.5 mg/mL (Non-ACS indications) 0.15 mg/kg/hr (01/25/18 1300)  . diltiazem (CARDIZEM) infusion Stopped (01/24/18 1336)  . diltiazem (CARDIZEM) infusion Stopped (01/24/18 1914)  . feeding supplement (VITAL AF 1.2 CAL) 1,000 mL (01/25/18 1032)  . fentaNYL infusion INTRAVENOUS 250 mcg/hr (01/25/18 1300)  . magnesium sulfate 1 - 4 g bolus IVPB Stopped (01/24/18 1329)  . magnesium sulfate 1 - 4 g bolus IVPB    . midazolam (VERSED) infusion 4 mg/hr (01/25/18 1300)  . norepinephrine (LEVOPHED) Adult infusion    . phenylephrine (NEO-SYNEPHRINE) Adult infusion 30 mcg/min (01/25/18 1300)    Assessment: 57 yo male previously on heparin and TPA for thrombolysis of L leg critical ischemia.  Platelet count with downward trend today.  HIT unlikely, but pharmacy asked to switch heparin to bivalirudin just in case.  Also asked to increase to full dose anticoagulation this morning.  Initial aPTT on bivalirudin = 50.  No overt bleeding or complications noted.  Goal of Therapy:  APTT 50-85 sec Monitor platelets by anticoagulation protocol: Yes   Plan:  Continue bivalirudin 0.15 mg/kg/hr Recheck aPTT in 2 hrs. Daily aPTT, CBC. F/u plans for oral anticoagulation eventually.  Jenetta DownerJessica Gabbrielle Mcnicholas, Pharm D, BCPS, [facility] Clinical Pharmacist Phone [phone]  01/25/2018 1:41 PM

## 2018-01-25 NOTE — Anesthesia Postprocedure Evaluation (Signed)
Anesthesia Post Note  Patient: Willie Morales  Procedure(s) Performed: MECHANICAL THROMBECTOMY USING PERNUMBRA WITH AORTOGRAM AND INTERVENTION (Left ) Lysis Re-check (Left ) FASCIOTOMY OF LOWER LEFT LEG (Left )     Patient location during evaluation: SICU Anesthesia Type: General Level of consciousness: sedated Pain management: pain level controlled Vital Signs Assessment: post-procedure vital signs reviewed and stable Respiratory status: patient remains intubated per anesthesia plan Cardiovascular status: stable Postop Assessment: no apparent nausea or vomiting Anesthetic complications: no    Last Vitals:  Vitals:   01/25/18 0415 01/25/18 0430  BP: 115/85 113/71  Pulse: 66 75  Resp: 20 20  Temp:    SpO2: 100% 100%    Last Pain:  Vitals:   01/25/18 0400  TempSrc: Axillary  PainSc:                  Sapphira Harjo P Rayonna Heldman

## 2018-01-25 NOTE — Progress Notes (Signed)
    Subjective  - POD #1,2 s/p lysis and intervention  Remains intubated   Physical Exam:  Brisk left PT signal Monophasic DP signal Fasciotomy vac in place    Assessment/Plan:  POD #1,2  ID:  Will keep on Ancef for 5 days given groin closure after sheath in for 36 hours Pulm:  Appreciate CCM assistance.  Plan to keep intubated 1 more day given pulm edema. Vasc:  On ASA and Plavix.  Brisk PT siganl WOund:  Plan for vac change / fasciotomy closure on MOnday Cardiac:  AFIB better rate controlled.  Will increase heparin to goal GI:  Start tube feeds  Willie Morales 01/25/2018 9:06 AM --  Vitals:   01/25/18 0819 01/25/18 0835  BP:  (!) 94/57  Pulse: 91 79  Resp: 20 20  Temp:    SpO2: 100% 100%    Intake/Output Summary (Last 24 hours) at 01/25/2018 0906 Last data filed at 01/25/2018 0800 Gross per 24 hour  Intake 8498.92 ml  Output 3510 ml  Net 4988.92 ml     Laboratory CBC    Component Value Date/Time   WBC 17.6 (H) 01/25/2018 0350   HGB 9.8 (L) 01/25/2018 0350   HCT 29.8 (L) 01/25/2018 0350   PLT 98 (L) 01/25/2018 0350    BMET    Component Value Date/Time   NA 140 01/25/2018 0350   K 3.6 01/25/2018 0350   CL 108 01/25/2018 0350   CO2 16 (L) 01/25/2018 0350   GLUCOSE 283 (H) 01/25/2018 0350   BUN 13 01/25/2018 0350   CREATININE 1.29 (H) 01/25/2018 0350   CALCIUM 8.8 (L) 01/25/2018 0350   GFRNONAA >60 01/25/2018 0350   GFRAA >60 01/25/2018 0350    COAG Lab Results  Component Value Date   INR 1.24 01/23/2018   INR 1.13 09/17/2017   INR 1.11 05/09/2017   No results found for: PTT  Antibiotics Anti-infectives (From admission, onward)   Start     Dose/Rate Route Frequency Ordered Stop   01/24/18 2130  ceFAZolin (ANCEF) IVPB 2g/100 mL premix     2 g 200 mL/hr over 30 Minutes Intravenous Every 8 hours 01/24/18 2000 01/25/18 0657   01/24/18 1500  ceFAZolin (ANCEF) IVPB 2g/100 mL premix     2 g 200 mL/hr over 30 Minutes Intravenous  Once  01/24/18 1453 01/24/18 1532   01/24/18 1454  ceFAZolin (ANCEF) 2-4 GM/100ML-% IVPB    Note to Pharmacy:  Sabino NiemannHogue, Samantha   : cabinet override      01/24/18 1454 01/24/18 1532       V. Charlena CrossWells Tamani Morales IV, M.D. [facility] Office: [phone] Pager:  [phone]

## 2018-01-25 NOTE — Progress Notes (Signed)
ANTICOAGULATION CONSULT NOTE - Initial Consult  Pharmacy Consult for Bivalirudin Indication: atrial fibrillation and thrombectomy  Allergies  Allergen Reactions  . Ciprofloxacin Rash    Patient Measurements: Height: 6\' 1"  (185.4 cm) Weight: 173 lb 11.6 oz (78.8 kg) IBW/kg (Calculated) : 79.9 Heparin Dosing Weight: 78.8 kg  Vital Signs: Temp: 97.5 F (36.4 C) (12/07 1623) Temp Source: Oral (12/07 1623) BP: 93/57 (12/07 1636) Pulse Rate: 85 (12/07 1636)  Labs: Recent Labs    01/23/18 0908 01/23/18 0923  01/24/18 0326 01/24/18 0826 01/24/18 1043 01/24/18 1253 01/24/18 1810 01/24/18 2035 01/24/18 2343 01/25/18 0350 01/25/18 0630 01/25/18 1000 01/25/18 1238 01/25/18 1611  HGB 15.0 16.7   < > 14.1  --  14.2  --  12.9* 10.3*  --  9.8*  --   --   --   --   HCT 46.4 49.0   < > 44.8  --  44.4  --  38.0* 33.0*  --  29.8*  --   --   --   --   PLT 160  --    < > 171  --  105*  --   --  114*  --  98*  --   --   --   --   APTT  --   --    < > 32  --   --  29  --   --   --  34  --   --  50* 56*  LABPROT 15.5*  --   --   --   --   --   --   --   --   --   --   --   --   --   --   INR 1.24  --   --   --   --   --   --   --   --   --   --   --   --   --   --   HEPARINUNFRC  --   --    < > 0.60  --   --  <0.10*  --   --   --   --  0.23*  --   --   --   CREATININE 1.05 1.00  --   --  1.23  --   --   --   --   --  1.29*  --   --   --   --   CKTOTAL  --   --   --   --   --   --   --   --   --  9,708*  --   --   --   --   --   TROPONINI  --   --   --   --   --   --   --   --   --   --   --   --  1.57*  --   --    < > = values in this interval not displayed.    Estimated Creatinine Clearance: 72.1 mL/min (A) (by C-G formula based on SCr of 1.29 mg/dL (H)).   Medical History: Past Medical History:  Diagnosis Date  . Carotid artery disease (HCC)    a. 60-70% RICA, totally occluded LICA, > 50% stenosed bilat ECA 04/2017.  Marland Kitchen. Chronic atrial fibrillation   . Coronary artery disease    a. remote cath in 2009 showing 30% mRCA, 30%  PDA, 45% LAD, 30% diag.  . Diabetes mellitus   . Dyslipidemia   . History of kidney stones   . Hypertension   . Tobacco abuse     Medications:  Infusions:  . sodium chloride    . sodium chloride 100 mL/hr at 01/25/18 1600  . sodium chloride    . sodium chloride Stopped (01/24/18 2042)  . acetaminophen    . amiodarone 30 mg/hr (01/25/18 1600)  . bivalirudin (ANGIOMAX) infusion 0.5 mg/mL (Non-ACS indications) 0.15 mg/kg/hr (01/25/18 1600)  . diltiazem (CARDIZEM) infusion Stopped (01/24/18 1336)  . diltiazem (CARDIZEM) infusion Stopped (01/24/18 1914)  . feeding supplement (VITAL AF 1.2 CAL) 1,000 mL (01/25/18 1032)  . fentaNYL infusion INTRAVENOUS 250 mcg/hr (01/25/18 1600)  . magnesium sulfate 1 - 4 g bolus IVPB Stopped (01/24/18 1329)  . magnesium sulfate 1 - 4 g bolus IVPB    . midazolam (VERSED) infusion 2 mg/hr (01/25/18 1600)  . norepinephrine (LEVOPHED) Adult infusion    . phenylephrine (NEO-SYNEPHRINE) Adult infusion Stopped (01/25/18 1514)    Assessment: 57 yo male previously on heparin and TPA for thrombolysis of L leg critical ischemia.  Platelet count with downward trend today.  HIT unlikely, but pharmacy asked to switch heparin to bivalirudin just in case.  Also asked to increase to full dose anticoagulation this morning.  aPTT this evening resulted as therapeutic (aPTT 56 << 50, goal of 50-85). No issues noted at this time.   Goal of Therapy:  APTT 50-85 sec Monitor platelets by anticoagulation protocol: Yes   Plan:  Continue bivalirudin 0.15 mg/kg/hr  F/u with aPTT in the AM Daily aPTT, CBC. F/u plans for oral anticoagulation eventually.  Thank you for allowing pharmacy to be a part of this patient's care.  Georgina Pillion, PharmD, BCPS Clinical Pharmacist Please check AMION for all [facility] Pharmacy numbers 01/25/2018 5:00 PM

## 2018-01-25 NOTE — Progress Notes (Addendum)
Initial Nutrition Assessment  DOCUMENTATION CODES:   Not applicable  INTERVENTION:   -Vital AF 1.2 @ 20 ml/hr via OGT -Per CCM plan to trickle until extubated tomorrow  Recommend starting TF at goal rate of 65 ml/hr (1560 ml) as pt shows no signs of refeeding. At goal rate TF provides: 1872 kcals, 117 grams protein, 1265 ml free water.   NUTRITION DIAGNOSIS:   Increased nutrient needs related to post-op healing as evidenced by estimated needs. GOAL:   Patient will meet greater than or equal to 90% of their needs  MONITOR:   Diet advancement, Vent status, Labs, I & O's, Skin, Weight trends, TF tolerance  REASON FOR ASSESSMENT:   Consult Enteral/tube feeding initiation and management  ASSESSMENT:   Patient with PMH significant for DM, HTN, HLD, tobacco abuse, CAD, and extensive PAD s/p below-knee popliteal bypass (04/2017). Presents this admission with acute on chronic limb ischemia.    12/6- mechanical thrombectomy of the L femoropopliteal pypass with balloon angioplasty and multiple stent placements, wound VAC placement   No family at bedside to provide nutrition history. Per vascular surgery, plan for pt to be intubated 24 more hrs due to pulmonary edema. Plan to diurese once BP improves.   Per chart records, pt looks to have weighed 183 lb on 07/10/17 and 174 lb this admission (insignificant for time frame). With positive fluid status suspect weight reading is inaccurate. Will need to confirm with pt/family if there has been any recent wt loss.   Will use only wt reading in chart (78.8 kg) to estimate needs.   Patient is currently intubated on ventilator support MV: 12.7 L/min Temp (24hrs), Avg:98 F (36.7 C), Min:97.6 F (36.4 C), Max:99 F (37.2 C) BP: 102/78 MAP: 86  I/O: + 6.6 L since admit OGT: 500 ml x 24 hrs Wound VAC: 300 ml x 24 hrs UOP: 2.6 L x 24 hrs  Medications reviewed and include: 15 units Lantus, amiodarone, fentanyl, versed, neosynephrine Labs  reviewed: CBG [id number] Calcium 8.8 (L)- albumin wdl  NUTRITION - FOCUSED PHYSICAL EXAM:    Most Recent Value  Orbital Region  No depletion  Upper Arm Region  Mild depletion  Thoracic and Lumbar Region  Unable to assess  Buccal Region  Unable to assess  Temple Region  Moderate depletion  Clavicle Bone Region  Mild depletion  Clavicle and Acromion Bone Region  Mild depletion  Scapular Bone Region  Unable to assess  Dorsal Hand  Unable to assess [mits]  Patellar Region  Mild depletion  Anterior Thigh Region  Mild depletion  Posterior Calf Region  Mild depletion  Edema (RD Assessment)  None  Hair  Reviewed  Eyes  Unable to assess  Mouth  Unable to assess  Skin  Reviewed  Nails  Unable to assess     Diet Order:   Diet Order            Diet NPO time specified  Diet effective now              EDUCATION NEEDS:   Not appropriate for education at this time  Skin:  Skin Assessment: Skin Integrity Issues: Skin Integrity Issues:: Incisions Incisions: leg leg closed x2, right groin  Last BM:  01/22/18  Height:   Ht Readings from Last 1 Encounters:  01/23/18 6\' 1"  (1.854 m)    Weight:   Wt Readings from Last 1 Encounters:  01/23/18 78.8 kg    Ideal Body Weight:  83.6 kg  BMI:  Body mass index is 22.92 kg/m.  Estimated Nutritional Needs:   Kcal:  1887 kcal  Protein:  95-115  Fluid:  >/= 1.5 L/day   Vanessa Kickarly Wrangler Penning RD, LDN Clinical Nutrition Pager # - [phone]

## 2018-01-25 NOTE — Progress Notes (Signed)
Critical troponin called to MD Cristal Deerhristopher. No orders at this time. Norva KarvonenPhillip M Brenae Lasecki, RN

## 2018-01-25 NOTE — Progress Notes (Signed)
Called eLink regarding pt's vent orders not being entered. eLink MD okay with current settings. RT will continue to monitor.

## 2018-01-25 NOTE — Progress Notes (Signed)
Pt agitated, attempting to sit up in bed & pull out ETT tube. Reorientation unsuccessful. Pt moving purposefully & nodding yes when asked if in pain. Elink notified via monitor in room. Orders received to start propofol, PRN fentanyl. Pt more comfortable since propofol gtt began. A short time later, SBP dropped to the 60s, propofol gtt stopped, LR bolus & epinephrine push ordered per Dr. Warrick Parisiangan [facility](Elink CCM). Pt HR increased; afib RVR. Order for 12 lead EKG. CCM team, Dr. Minette HeadlandAljishi present at bedside.

## 2018-01-25 NOTE — Progress Notes (Signed)
Progress Note  Patient Name: Willie Morales Date of Encounter: 01/25/2018  Primary Cardiologist: Kathlyn Sacramento, MD   Subjective   Unable to assess.  Intubated and sedated.  Inpatient Medications    Scheduled Meds: . aspirin  81 mg Per Tube Daily  . atorvastatin  80 mg Per Tube q1800  . benazepril  10 mg Per Tube Daily  . chlorhexidine gluconate (MEDLINE KIT)  15 mL Mouth Rinse BID  . clopidogrel  75 mg Per Tube Daily  . docusate  100 mg Per Tube Daily  . insulin aspart  0-15 Units Subcutaneous Q4H  . mouth rinse  15 mL Mouth Rinse 10 times per day  . metoprolol tartrate  25 mg Per Tube BID  . pantoprazole sodium  40 mg Per Tube Daily  . sodium chloride flush  3 mL Intravenous Q12H   Continuous Infusions: . sodium chloride    . sodium chloride Stopped (01/24/18 2000)  . sodium chloride    . sodium chloride Stopped (01/24/18 2042)  . acetaminophen    . albumin human    . amiodarone 30 mg/hr (01/25/18 0800)  . diltiazem (CARDIZEM) infusion Stopped (01/24/18 1336)  . diltiazem (CARDIZEM) infusion Stopped (01/24/18 1914)  . fentaNYL infusion INTRAVENOUS 150 mcg/hr (01/25/18 0800)  . heparin 600 Units/hr (01/25/18 0800)  . lactated ringers 100 mL/hr at 01/25/18 0800  . magnesium sulfate 1 - 4 g bolus IVPB Stopped (01/24/18 1329)  . magnesium sulfate 1 - 4 g bolus IVPB    . midazolam (VERSED) infusion 5 mg/hr (01/25/18 0800)  . norepinephrine (LEVOPHED) Adult infusion    . phenylephrine (NEO-SYNEPHRINE) Adult infusion 20 mcg/min (01/25/18 0800)  . propofol (DIPRIVAN) infusion Stopped (01/24/18 2105)   PRN Meds: sodium chloride, sodium chloride, acetaminophen, alum & mag hydroxide-simeth, fentaNYL, fentaNYL (SUBLIMAZE) injection, guaiFENesin-dextromethorphan, hydrALAZINE, labetalol, magnesium sulfate 1 - 4 g bolus IVPB, metoprolol tartrate, midazolam, midazolam, morphine injection, ondansetron (ZOFRAN) IV, phenol, sodium chloride flush   Vital Signs    Vitals:   01/25/18 0800 01/25/18 0809 01/25/18 0819 01/25/18 0835  BP: 101/64   (!) 94/57  Pulse: 79 77 91 79  Resp: '20 20 20 20  ' Temp:      TempSrc:      SpO2: 100% 100% 100% 100%  Weight:      Height:        Intake/Output Summary (Last 24 hours) at 01/25/2018 0837 Last data filed at 01/25/2018 0800 Gross per 24 hour  Intake 8693.01 ml  Output 3910 ml  Net 4783.01 ml   Filed Weights   01/23/18 1738  Weight: 78.8 kg    Telemetry    Overnight atrial fibrillation with RVR.  Currently atrial fibrillation in the 70s. - Personally Reviewed  ECG    01/24/18: Atrial fibrillation.  Rate 116 bpm.  Anterolateral ST depression.  - Personally Reviewed  Physical Exam   VS:  BP (!) 94/57   Pulse 79   Temp 97.7 F (36.5 C) (Oral)   Resp 20   Ht '6\' 1"'  (1.854 m)   Wt 78.8 kg   SpO2 100%   BMI 22.92 kg/m  , BMI Body mass index is 22.92 kg/m. GENERAL:  Critically ill-appearing.  Intubated and sedated HEENT: Pupils equal round and reactive, fundi not visualized, oral mucosa unremarkable NECK:  No jugular venous distention, waveform within normal limits, carotid upstroke brisk and symmetric, no bruits LUNGS:  Clear to auscultation bilaterally on anterior exam.  Vented breath sounds HEART:  Irregularly irregular.  PMI not displaced or sustained,S1 and S2 within normal limits, no S3, no S4, no clicks, no rubs, no murmurs ABD:  Flat, positive bowel sounds normal in frequency in pitch, no bruits, no rebound, no guarding, no midline pulsatile mass, no hepatomegaly, no splenomegaly EXT:  No edema.  L LE fasciotomy with wound vac. no cyanosis no clubbing SKIN:  No rashes no nodules PSYCH:  Unable to assess  Labs    Chemistry Recent Labs  Lab 01/23/18 0908 01/23/18 0923 01/24/18 0826 01/24/18 1810 01/25/18 0350  NA 136 139 138 138 140  K 4.0 3.9 4.1 4.7 3.6  CL 103 104 103  --  108  CO2 22  --  14*  --  16*  GLUCOSE 233* 229* 302*  --  283*  BUN 17 21* 12  --  13  CREATININE 1.05 1.00  1.23  --  1.29*  CALCIUM 9.8  --  9.2  --  8.8*  PROT 8.3*  --   --   --  5.8*  ALBUMIN 4.7  --   --   --  3.8  AST 24  --   --   --  218*  ALT 20  --   --   --  60*  ALKPHOS 73  --   --   --  37*  BILITOT 1.3*  --   --   --  1.9*  GFRNONAA >60  --  >60  --  >60  GFRAA >60  --  >60  --  >60  ANIONGAP 11  --  21*  --  16*     Hematology Recent Labs  Lab 01/24/18 1043 01/24/18 1810 01/24/18 2035 01/25/18 0350  WBC 18.9*  --  20.9* 17.6*  RBC 4.80  --  3.51* 3.27*  HGB 14.2 12.9* 10.3* 9.8*  HCT 44.4 38.0* 33.0* 29.8*  MCV 92.5  --  94.0 91.1  MCH 29.6  --  29.3 30.0  MCHC 32.0  --  31.2 32.9  RDW 13.7  --  13.9 13.9  PLT 105*  --  114* 98*    Cardiac EnzymesNo results for input(s): TROPONINI in the last 168 hours. No results for input(s): TROPIPOC in the last 168 hours.   BNPNo results for input(s): BNP, PROBNP in the last 168 hours.   DDimer No results for input(s): DDIMER in the last 168 hours.   Radiology    Dg Abd 1 View  Result Date: 01/24/2018 CLINICAL DATA:  ET and OG tube placement EXAM: ABDOMEN - 1 VIEW COMPARISON:  None. FINDINGS: NG tube is seen to extend into the stomach with side port below the GE junction. Pulmonary edema pattern. IMPRESSION: NG tube in good position. Electronically Signed   By: Suzy Bouchard M.D.   On: 01/24/2018 22:44   Dg Chest Port 1 View  Result Date: 01/24/2018 CLINICAL DATA:  ET and OG tube placement EXAM: PORTABLE CHEST 1 VIEW COMPARISON:  01/24/2018 FINDINGS: Endotracheal tube 6.8 cm from carina in good position. NG tube extends in the course of the esophagus below the lower margin the film. There is diffuse perihilar airspace disease.  No pneumothorax. IMPRESSION: 1. Endotracheal tube and NG tube appear in good position. 2. Pulmonary edema pattern. Electronically Signed   By: Suzy Bouchard M.D.   On: 01/24/2018 22:40   Dg Chest Port 1 View  Result Date: 01/24/2018 CLINICAL DATA:  57 year old male with history of cough EXAM:  PORTABLE CHEST 1 VIEW COMPARISON:  03/05/2016 FINDINGS:  Cardiomediastinal silhouette unchanged in size and contour. No evidence of central vascular congestion. No pneumothorax. No confluent airspace disease. Similar appearance of diffusely coarsened interstitial markings. Evidence of emphysema with hyperinflation and flattened hemidiaphragms. IMPRESSION: Coarsened interstitial markings, appear chronic, with no evidence of lobar pneumonia. Emphysema Electronically Signed   By: Corrie Mckusick D.O.   On: 01/24/2018 12:08    Cardiac Studies   Echo 02/2016: Study Conclusions  - Left ventricle: The cavity size was normal. Wall thickness was   increased in a pattern of mild LVH. Systolic function was normal.   The estimated ejection fraction was in the range of 60% to 65%.   Wall motion was normal; there were no regional wall motion   abnormalities. The study was not technically sufficient to allow   evaluation of LV diastolic dysfunction due to atrial   fibrillation. - Aortic valve: Mildly calcified annulus. Trileaflet; mildly   thickened leaflets. - Mitral valve: Mildly thickened leaflets . There was mild   regurgitation. - Left atrium: The atrium was moderately dilated. - Right ventricle: Systolic function was mildly reduced. - Tricuspid valve: There was mild regurgitation.   Patient Profile     57 y.o. male with chronic atrial fibrillation, non-obstructive CAD, diabetes, hypertension, hyperlipidemia, and PAD status post recent peripheral bypass complicated by re-obstruction.  He is currently hospitalized with critical limb ischemia complicated by atrial fibrillation with rapid ventricular response.  Assessment & Plan    # Atrial fibrillation with rapid ventricular response: He has been on IV diltiazem for rate control.  This was stopped 2/2 hypotension and he was started on amiodarone.  Metoprolol has been ordered but not given also 2/2 low BP.  Heart rate well-controlled.  He is on  heparin for anticoagulation.  # Critical limb ischemia: # PAD: # Hyperlipidemia: Mr. Mizzell had thrombosis of the below the knee popliteal bypass and underwent cutdown with graft thrombectomy and patch angioplasty 08/2017.  Went to the OR 12/6 for mechanical thrombectomy of the L femoropopliteal pypass with balloon angioplasty and stent placement in the L posterior tibial, L peroneal, L tibiopernoneal trunk, L common iliac and L exernal iliac artereis.  He had a compartment fasciotomy due to high risk for compartment syndrome.  Atorvastatin was increased to 80m this admission.  Continue antiplatelets per Vascular Surgery.  # Hypotension: Patient developed hypotension overnight.  He returned from the OR hypotensive and tachycardic to the 200s.   He was agitated and unabel to wean from the vent.  He recevied 1L IVF and epinephrine 50 mcg IV x1.  Currently on phenylephrine and BP remains soft.  EKG yesterday showed significant anterolateral depression.  He had non-obstructive CAD in 2009.  Will check troponin and an echo to assess for reduced LVEF or wall motion abnormality.   # Tobacco abuse:  Suggest cessation.   # DM: Poorly controlled. Hgb A1c 7.6%.  He is only on glimepiride as an outpatient.  Would strongly consider adding an SGLT2 inhibitor for mortality and CV benefit.  Time spent: 45 minutes-Greater than 50% of this time was spent in counseling, explanation of diagnosis, planning of further management, and coordination of care.      For questions or updates, please contact CLake CityPlease consult [email or web address] for contact info under        Signed, TSkeet Latch MD  01/25/2018, 8:37 AM

## 2018-01-25 NOTE — Progress Notes (Signed)
PULMONARY / CRITICAL CARE MEDICINE   NAME:  Willie Morales, MRN:  [id number], DOB:  02/21/1960, LOS: 2 ADMISSION DATE:  01/23/2018, CONSULTATION DATE:  01/24/2018 REFERRING MD:  Dr Myra GianottiBrabham , CHIEF COMPLAINT:  resp failure   BRIEF HISTORY:    57 year old male with history of peripheral vascular disease and popliteal bypass adm  with thrombosed graft status post thrombectomy and multiple stents.   SIGNIFICANT PAST MEDICAL HISTORY   -Tobacco abuse -Diabetes mellitus -Atrial fibrillation on Eliquis -Hypertension -Peripheral vascular disease status post below-knee popliteal bypass March 2019 -Thrombosed graft underwent below-knee popliteal cutdown and graft thrombectomy July 2019  SIGNIFICANT EVENTS:   01/24/2018 >> mechanical thrombectomy of the left femoropopliteal bypass graft plus balloon angioplasty with multiple stents to the left posterior tibial artery, left peroneal artery, left tibioperoneal trunk, left common iliac artery and left external iliac artery patient also underwent compartment fasciotomy due to high risk for compartment syndrome. -Intubation 01/24/2018  STUDIES:    CULTURES:    ANTIBIOTICS:    LINES/TUBES:  ETT 12/6 >>  CONSULTANTS:   SUBJECTIVE:  Agitated overnight requiring fent/ versed gtt Hypotensive with propofol  Started on amio for AF-RVR RN reports daughter stated sundowns since old cva   CONSTITUTIONAL: BP (!) 94/57   Pulse 79   Temp 97.7 F (36.5 C) (Oral)   Resp 20   Ht 6\' 1"  (1.854 m)   Wt 78.8 kg   SpO2 100%   BMI 22.92 kg/m   I/O last 3 completed shifts: In: 10412.1 [P.O.:240; I.V.:8026.5; IV Piggyback:2145.6] Out: 5700 [Urine:4350; Emesis/NG output:650; Drains:300; Blood:400]     Vent Mode: PRVC FiO2 (%):  [40 %-60 %] 40 % Set Rate:  [16 bmp-20 bmp] 20 bmp Vt Set:  [600 mL-630 mL] 630 mL PEEP:  [5 cmH20] 5 cmH20 Plateau Pressure:  [8 cmH20-19 cmH20] 19 cmH20  PHYSICAL EXAM: General: Intubated acutely ill on vent, int  agitation per RN Neuro: sedated on versed/fent gtt RASS -3, was non focal prior HEENT: no pallor, icterus Cardiovascular: Atrial fibrillation irregularly irregular tachycardic Lungs: Coarse crackles bilaterally no wheezing or dullness Abdomen: Soft no tenderness organomegaly Musculoskeletal: No lower limb edema,mottled left leg with fasciotomy + wond vac in his left lower limb pulses nonpalpable Skin: No rash  Chest x-ray personally reviewed which shows increase bilateral interstitial markings in the pattern of edema  RESOLVED PROBLEM LIST   ASSESSMENT AND PLAN   Assessment: Acute hypoxemic respiratory failure requiring mechanical ventilation -vent settings reviewed & adjusted  Hypotension -Related to meds/intubation Low-dose neo-, expected tapered off DC benazepril   Acute pulmonary edema -Would need to diuresis eventually once blood pressure permits, currently limited by hypotension   Atrial fibrillation with rapid ventricular response -Continue amiodarone for now -Rate control may be the best strategy here -Await echo and serial troponins  Peripheral vascular disease with thrombosed graft status post thrombectomy and multiple stents -Per vascular surgery Continue Plavix Ancef empiric   -Acute kidney injury / rhabdomyolysis - ct NS @ 100/h Monitor CK  Acute encephalopathy -Did not tolerate propofol due to severe hypotension, doubt he will tolerate Precedex Continue fentanyl drip and can increase dosing, goal RA SS -2 -Add Haldol and minimize Versed drip  Diabetes type 2, uncontrolled hyperglycemia -Add Lantus 15 units, Moderate SSI   SUMMARY OF TODAY'S PLAN:  Will hold off on extubation due to pulmonary edema At the same time needs fluids due to rhabdo Diuresis once blood pressure improves and off neo gtt     Best  Practice / Goals of Care / Disposition.   DVT PROPHYLAXIS: As per surgery SUP: PPI NUTRITION: N.p.o. MOBILITY:*Bedrest GOALS OF CARE:  Full code FAMILY DISCUSSIONS: No family around DISPOSITION ICU admission   The patient is critically ill with multiple organ systems failure and requires high complexity decision making for assessment and support, frequent evaluation and titration of therapies, application of advanced monitoring technologies and extensive interpretation of multiple databases. Critical Care Time devoted to patient care services described in this note independent of APP/resident  time is 35 minutes.    Cyril Mourning MD. Tonny Bollman. [place] Pager [phone] If no response call [phone]   01/25/2018

## 2018-01-25 NOTE — Progress Notes (Signed)
eLink Physician-Brief Progress Note Patient Name: Willie CarmelGeorge A Morales DOB: 03/04/1960 MRN: [id number]   Date of Service  01/25/2018  HPI/Events of Note  Agitation - Request to renew bilateral soft wrist restraints.   eICU Interventions  Will renew soft bilateral wrist restraints.      Intervention Category Minor Interventions: Agitation / anxiety - evaluation and management  Willie Morales,Willie Morales 01/25/2018, 10:15 PM

## 2018-01-25 NOTE — Progress Notes (Signed)
Pt returned to 2H19 from OR, intubated on ventilator, wound vac & foley in place. Care assumed at this time.

## 2018-01-26 ENCOUNTER — Inpatient Hospital Stay (HOSPITAL_COMMUNITY): Payer: BLUE CROSS/BLUE SHIELD

## 2018-01-26 ENCOUNTER — Inpatient Hospital Stay: Payer: Self-pay

## 2018-01-26 DIAGNOSIS — I952 Hypotension due to drugs: Secondary | ICD-10-CM

## 2018-01-26 DIAGNOSIS — I361 Nonrheumatic tricuspid (valve) insufficiency: Secondary | ICD-10-CM

## 2018-01-26 DIAGNOSIS — I340 Nonrheumatic mitral (valve) insufficiency: Secondary | ICD-10-CM

## 2018-01-26 LAB — POCT I-STAT 3, ART BLOOD GAS (G3+)
Acid-base deficit: 5 mmol/L — ABNORMAL HIGH (ref 0.0–2.0)
Bicarbonate: 20.4 mmol/L (ref 20.0–28.0)
O2 Saturation: 97 %
Patient temperature: 98.6
TCO2: 22 mmol/L (ref 22–32)
pCO2 arterial: 40.2 mmHg (ref 32.0–48.0)
pH, Arterial: 7.313 — ABNORMAL LOW (ref 7.350–7.450)
pO2, Arterial: 104 mmHg (ref 83.0–108.0)

## 2018-01-26 LAB — APTT: aPTT: 57 seconds — ABNORMAL HIGH (ref 24–36)

## 2018-01-26 LAB — GLUCOSE, CAPILLARY
Glucose-Capillary: 117 mg/dL — ABNORMAL HIGH (ref 70–99)
Glucose-Capillary: 188 mg/dL — ABNORMAL HIGH (ref 70–99)
Glucose-Capillary: 205 mg/dL — ABNORMAL HIGH (ref 70–99)
Glucose-Capillary: 195 mg/dL — ABNORMAL HIGH (ref 70–99)
Glucose-Capillary: 139 mg/dL — ABNORMAL HIGH (ref 70–99)

## 2018-01-26 LAB — ECHOCARDIOGRAM COMPLETE
Height: 73 in
Weight: 2980.62 oz

## 2018-01-26 LAB — BASIC METABOLIC PANEL
Anion gap: 9 (ref 5–15)
BUN: 13 mg/dL (ref 6–20)
CO2: 20 mmol/L — ABNORMAL LOW (ref 22–32)
Calcium: 8.1 mg/dL — ABNORMAL LOW (ref 8.9–10.3)
Chloride: 112 mmol/L — ABNORMAL HIGH (ref 98–111)
Creatinine, Ser: 0.99 mg/dL (ref 0.61–1.24)
GFR calc Af Amer: >60 mL/min (ref >60)
GFR calc non Af Amer: >60 mL/min (ref >60)
Glucose, Bld: 179 mg/dL — ABNORMAL HIGH (ref 70–99)
Potassium: 3.1 mmol/L — ABNORMAL LOW (ref 3.5–5.1)
Sodium: 141 mmol/L (ref 135–145)

## 2018-01-26 LAB — TROPONIN I: Troponin I: 1.15 ng/mL (ref <0.03)

## 2018-01-26 LAB — HEPARIN INDUCED PLATELET AB (HIT ANTIBODY): Heparin Induced Plt Ab: 0.123 OD (ref 0.000–0.400)

## 2018-01-26 LAB — CBC
HCT: 26.6 % — ABNORMAL LOW (ref 39.0–52.0)
Hemoglobin: 8.6 g/dL — ABNORMAL LOW (ref 13.0–17.0)
MCH: 29.4 pg (ref 26.0–34.0)
MCHC: 32.3 g/dL (ref 30.0–36.0)
MCV: 90.8 fL (ref 80.0–100.0)
Platelets: 89 10*3/uL — ABNORMAL LOW (ref 150–400)
RBC: 2.93 MIL/uL — ABNORMAL LOW (ref 4.22–5.81)
RDW: 14 % (ref 11.5–15.5)
WBC: 14.4 10*3/uL — ABNORMAL HIGH (ref 4.0–10.5)
nRBC: 0 % (ref 0.0–0.2)

## 2018-01-26 LAB — MAGNESIUM: Magnesium: 1.8 mg/dL (ref 1.7–2.4)

## 2018-01-26 LAB — CK: Total CK: 9063 U/L — ABNORMAL HIGH (ref 49–397)

## 2018-01-26 LAB — PHOSPHORUS: Phosphorus: 1.8 mg/dL — ABNORMAL LOW (ref 2.5–4.6)

## 2018-01-26 MED ORDER — SODIUM CHLORIDE 0.9% FLUSH
10.0000 mL | Freq: Two times a day (BID) | INTRAVENOUS | Status: DC
  Administered 2018-01-26 – 2018-01-27 (×4): 10 mL
  Administered 2018-01-28: 20 mL
  Administered 2018-01-28: 10 mL
  Administered 2018-01-29 – 2018-01-31 (×4): 20 mL

## 2018-01-26 MED ORDER — CHLORHEXIDINE GLUCONATE CLOTH 2 % EX PADS
6.0000 | MEDICATED_PAD | Freq: Every day | CUTANEOUS | Status: DC
  Administered 2018-01-27 – 2018-01-30 (×5): 6 via TOPICAL

## 2018-01-26 MED ORDER — AMIODARONE LOAD VIA INFUSION
150.0000 mg | Freq: Once | INTRAVENOUS | Status: DC
  Filled 2018-01-26: qty 83.34

## 2018-01-26 MED ORDER — GABAPENTIN 250 MG/5ML PO SOLN
300.0000 mg | Freq: Three times a day (TID) | ORAL | Status: DC
  Filled 2018-01-26 (×2): qty 6

## 2018-01-26 MED ORDER — RISPERIDONE 1 MG/ML PO SOLN
1.0000 mg | Freq: Every day | ORAL | Status: DC
  Administered 2018-01-26: 1 mg
  Filled 2018-01-26 (×2): qty 1

## 2018-01-26 MED ORDER — AMIODARONE HCL 200 MG PO TABS
200.0000 mg | ORAL_TABLET | Freq: Two times a day (BID) | ORAL | Status: DC
  Administered 2018-01-26: 200 mg via ORAL
  Filled 2018-01-26 (×2): qty 1

## 2018-01-26 MED ORDER — POTASSIUM PHOSPHATES 15 MMOLE/5ML IV SOLN
30.0000 mmol | Freq: Once | INTRAVENOUS | Status: AC
  Administered 2018-01-26: 30 mmol via INTRAVENOUS
  Filled 2018-01-26: qty 10

## 2018-01-26 MED ORDER — AMIODARONE HCL IN DEXTROSE 360-4.14 MG/200ML-% IV SOLN
60.0000 mg/h | INTRAVENOUS | Status: AC
  Administered 2018-01-26 – 2018-01-27 (×2): 60 mg/h via INTRAVENOUS
  Filled 2018-01-26: qty 200

## 2018-01-26 MED ORDER — SODIUM CHLORIDE 0.9% FLUSH: 10.0000 mL | INTRAVENOUS | Status: DC | PRN

## 2018-01-26 MED ORDER — AMIODARONE HCL IN DEXTROSE 360-4.14 MG/200ML-% IV SOLN
INTRAVENOUS | Status: AC
  Administered 2018-01-26: 60 mg/h via INTRAVENOUS
  Filled 2018-01-26: qty 200

## 2018-01-26 MED ORDER — MAGNESIUM SULFATE IN D5W 1-5 GM/100ML-% IV SOLN
1.0000 g | Freq: Once | INTRAVENOUS | Status: AC
  Administered 2018-01-26: 1 g via INTRAVENOUS
  Filled 2018-01-26: qty 100

## 2018-01-26 MED ORDER — AMIODARONE HCL IN DEXTROSE 360-4.14 MG/200ML-% IV SOLN
30.0000 mg/h | INTRAVENOUS | Status: DC
  Administered 2018-01-27 – 2018-01-29 (×5): 30 mg/h via INTRAVENOUS
  Filled 2018-01-26 (×5): qty 200

## 2018-01-26 MED ORDER — POTASSIUM CHLORIDE 20 MEQ/15ML (10%) PO SOLN
40.0000 meq | Freq: Once | ORAL | Status: AC
  Administered 2018-01-26: 40 meq via ORAL
  Filled 2018-01-26: qty 30

## 2018-01-26 MED ORDER — SODIUM CHLORIDE 0.9 % IV BOLUS: 1000.0000 mL | Freq: Once | INTRAVENOUS | Status: DC

## 2018-01-26 MED ORDER — DEXMEDETOMIDINE HCL IN NACL 200 MCG/50ML IV SOLN
0.4000 ug/kg/h | INTRAVENOUS | Status: DC
  Administered 2018-01-26 (×4): 0.4 ug/kg/h via INTRAVENOUS
  Filled 2018-01-26 (×4): qty 50

## 2018-01-26 NOTE — Progress Notes (Signed)
Peripherally Inserted Central Catheter/Midline Placement  The IV Nurse has discussed with the patient and/or persons authorized to consent for the patient, the purpose of this procedure and the potential benefits and risks involved with this procedure.  The benefits include less needle sticks, lab draws from the catheter, and the patient may be discharged home with the catheter. Risks include, but not limited to, infection, bleeding, blood clot (thrombus formation), and puncture of an artery; nerve damage and irregular heartbeat and possibility to perform a PICC exchange if needed/ordered by physician.  Alternatives to this procedure were also discussed.  Bard Power PICC patient education guide, fact sheet on infection prevention and patient information card has been provided to patient /or left at bedside.  Consent obtained via telephone from sister due to altered mental status.  PICC placed by Chari Manningheresa Riggs, RN  PICC/Midline Placement Documentation  PICC Double Lumen 01/26/18 PICC Right Brachial 41 cm 0 cm (Active)  Indication for Insertion or Continuance of Line Limited venous access - need for IV therapy >5 days (PICC only);Poor Vasculature-patient has had multiple peripheral attempts or PIVs lasting less than 24 hours 01/26/2018 12:59 PM  Exposed Catheter (cm) 0 cm 01/26/2018 12:59 PM  Site Assessment Clean;Dry;Intact 01/26/2018 12:59 PM  Lumen #1 Status Flushed;Saline locked;Blood return noted 01/26/2018 12:59 PM  Lumen #2 Status Flushed;Saline locked;Blood return noted 01/26/2018 12:59 PM  Dressing Type Transparent 01/26/2018 12:59 PM  Dressing Status Clean;Dry;Intact 01/26/2018 12:59 PM  Dressing Intervention New dressing 01/26/2018 12:59 PM  Dressing Change Due 02/02/18 01/26/2018 12:59 PM       Azzure Garabedian, Lajean ManesKerry Loraine 01/26/2018, 1:00 PM

## 2018-01-26 NOTE — Progress Notes (Addendum)
RN found patient with ET tube in hand at 2207 and immediately called respiratory therapist. Molly Maduroobert RN removed OG tube and bagged him. Elink contacted, spoke to CongervilleAbby, Charity fundraiserN. Respiratory initiated BPAP due to periods of apnea. Patient required verbal and physical stimulation to be reminded to breathe. MD contacted at 2210. MD sent ground team.

## 2018-01-26 NOTE — Progress Notes (Signed)
Patient's ETT was advanced by 4cm to 27 cm per Dr. Cyril Mourningakesh Alva, MD's orders. The patient's ETT was 9cm above the carina on the CXR.

## 2018-01-26 NOTE — Progress Notes (Addendum)
eLink Physician-Brief Progress Note Patient Name: Willie CarmelGeorge A Morales DOB: 07/14/1960 MRN: [id number]   Date of Service  01/26/2018  HPI/Events of Note  Multiple issues: 1. Oliguria and 2. Confusion with IV fluid order. CXR from 01/24/2018 is c/w pulmonary edema. No CVL or CVP. Unable to diurese d/t vasopressor requirement for hemodynamic stability.  eICU Interventions  Will order:  1. Decrease  0.9 NaCl to run IV at 50 mL/hour.      Intervention Category Intermediate Interventions: Oliguria - evaluation and management  Sommer,Steven Eugene 01/26/2018, 2:26 AM

## 2018-01-26 NOTE — Progress Notes (Signed)
    Subjective  - POD #2,3  Remains intubated agitated   Physical Exam:  Wound vacs remain in place and functioning Brisk posterior tibial Doppler signal Intubated, agitated       Assessment/Plan:    Appreciate assistance from cardiology and CCM Requiring low-dose vasopressor support for blood pressure Remains intubated.  Ventilator management per CCM A. fib: Heparin drip changed to Angiomax given drop in platelet count and possible concern for HIT.  Will need transition back to Eliquis at a later date Plan for wound VAC change at the bedside tomorrow Acute blood loss anemia: Hemoglobin remained stable.  No indication for transfusion Renal: Creatinine 0.99.  Continue to monitor urine output Vascular: Perfusing left foot.  Neurologic assessment cannot be performed given intubation.  On aspirin and Plavix GI: Tube feeds Willie Morales 01/26/2018 3:55 PM --  Vitals:   01/26/18 1524 01/26/18 1530  BP:  104/74  Pulse:  63  Resp:  20  Temp: (!) 97.5 F (36.4 C)   SpO2:  100%    Intake/Output Summary (Last 24 hours) at 01/26/2018 1555 Last data filed at 01/26/2018 1200 Gross per 24 hour  Intake 4220.05 ml  Output 660 ml  Net 3560.05 ml     Laboratory CBC    Component Value Date/Time   WBC 14.4 (H) 01/26/2018 0522   HGB 8.6 (L) 01/26/2018 0522   HCT 26.6 (L) 01/26/2018 0522   PLT 89 (L) 01/26/2018 0522    BMET    Component Value Date/Time   NA 141 01/26/2018 0522   K 3.1 (L) 01/26/2018 0522   CL 112 (H) 01/26/2018 0522   CO2 20 (L) 01/26/2018 0522   GLUCOSE 179 (H) 01/26/2018 0522   BUN 13 01/26/2018 0522   CREATININE 0.99 01/26/2018 0522   CALCIUM 8.1 (L) 01/26/2018 0522   GFRNONAA >60 01/26/2018 0522   GFRAA >60 01/26/2018 0522    COAG Lab Results  Component Value Date   INR 1.24 01/23/2018   INR 1.13 09/17/2017   INR 1.11 05/09/2017   No results found for: PTT  Antibiotics Anti-infectives (From admission, onward)   Start     Dose/Rate  Route Frequency Ordered Stop   01/25/18 0930  cephALEXin (KEFLEX) capsule 500 mg     500 mg Oral Every 8 hours 01/25/18 0919     01/24/18 2130  ceFAZolin (ANCEF) IVPB 2g/100 mL premix     2 g 200 mL/hr over 30 Minutes Intravenous Every 8 hours 01/24/18 2000 01/25/18 0657   01/24/18 1500  ceFAZolin (ANCEF) IVPB 2g/100 mL premix     2 g 200 mL/hr over 30 Minutes Intravenous  Once 01/24/18 1453 01/24/18 1532   01/24/18 1454  ceFAZolin (ANCEF) 2-4 GM/100ML-% IVPB    Note to Pharmacy:  [name], Willie   : cabinet override      01/24/18 1454 01/24/18 1532       V. Charlena CrossWells Willie Morales IV, M.D. [facility] Office: [phone] Pager:  [phone]

## 2018-01-26 NOTE — Progress Notes (Signed)
eLink Physician-Brief Progress Note Patient Name: Willie Morales DOB: 05/05/1960 MRN: [id number]   Date of Service  01/26/2018  HPI/Events of Note  K+ = 3.1, Mg++ = 1.8, PO4--- = 1.8 and Creatinine = 0.99.  eICU Interventions  Will replace K+, PO4--- and Mg++.      Intervention Category Major Interventions: Electrolyte abnormality - evaluation and management  Sommer,Steven Eugene 01/26/2018, 6:44 AM

## 2018-01-26 NOTE — Progress Notes (Signed)
Drug/Drug Interaction Note:   Drug-Drug Interaction Report  Risperidone / QT-prolonging Agents (Highest Risk)   Significance: Major   Warning: Additive QT interval prolongation may occur during coadministration of risperidone, a moderate-risk QT-prolonging agent, and amiodarone.  Onset: RapidDocumentation Level: Possible  Interacting Medications/Orders: Risperidone Oral or Non-Oral, Systemic QT-prolonging Agents (Highest Risk) Oral or Non-Oral, Systemic  1. risperiDONE  Order ([id number]): risperiDONE (RISPERDAL) 1 MG/ML oral solution 1 mg Route: Per Tube Start: 01/26/2018 1000 End: none Frequency: Daily 1. amiodarone  Order ([id number]): amiodarone (PACERONE) tablet 200 mg Route: Oral Start: 01/26/2018 1000 End: none Frequency: 2 times daily   Management Code: Professional review suggested  Plan:  QTc = 468 on EKG 12/6.  Consider risk benefit and monitor closely  Effects: Additive QT interval prolongation may occur during coadministration of risperidone, a moderate-risk QT-prolonging agent, and amiodarone.  Mechanism: Additive QT interval prolongation may occur during coadministration of risperidone and amiodarone.  Management: Consider alternatives to this combination. If combined use is necessary, monitor for QTc interval prolongation and arrhythmias (including torsadesde pointes). Patients with other risk factors (eg, older age, male sex, bradycardia, hypokalemia, hypomagnesemia, heart disease, and higher drug concentrations) are likely at greater risk for these potentially life-threatening toxicities. Limit dosages and durations to the minimum required and monitor closely for signs of respiratory depression and sedation.    Nadara MustardNita Kadia Abaya, PharmD., MS Clinical Pharmacist Pager:  [phone] Thank you for allowing pharmacy to be part of this patients care team. 01/26/2018,9:19 AM

## 2018-01-26 NOTE — Progress Notes (Signed)
  Echocardiogram 2D Echocardiogram has been performed.  Roosvelt MaserLane, Ahtziry Saathoff F 01/26/2018, 2:40 PM

## 2018-01-26 NOTE — Progress Notes (Signed)
Progress Note  Patient Name: Willie Morales Date of Encounter: 01/26/2018  Primary Cardiologist: Kathlyn Sacramento, MD   Subjective   Unable to assess.  Intubated and sedated.  Turns head to voice.  Inpatient Medications    Scheduled Meds: . aspirin  81 mg Per Tube Daily  . atorvastatin  80 mg Per Tube q1800  . cephALEXin  500 mg Oral Q8H  . chlorhexidine gluconate (MEDLINE KIT)  15 mL Mouth Rinse BID  . clopidogrel  75 mg Per Tube Daily  . docusate  100 mg Per Tube Daily  . gabapentin  300 mg Oral TID  . insulin aspart  0-15 Units Subcutaneous Q4H  . insulin glargine  15 Units Subcutaneous Daily  . levothyroxine  25 mcg Oral QAC breakfast  . mouth rinse  15 mL Mouth Rinse 10 times per day  . metoprolol tartrate  25 mg Per Tube BID  . pantoprazole sodium  40 mg Per Tube Daily  . sodium chloride flush  3 mL Intravenous Q12H   Continuous Infusions: . sodium chloride    . sodium chloride 50 mL/hr at 01/26/18 0326  . sodium chloride    . amiodarone 30 mg/hr (01/26/18 0600)  . bivalirudin (ANGIOMAX) infusion 0.5 mg/mL (Non-ACS indications) 0.15 mg/kg/hr (01/26/18 0600)  . diltiazem (CARDIZEM) infusion Stopped (01/24/18 1336)  . diltiazem (CARDIZEM) infusion Stopped (01/24/18 1914)  . feeding supplement (VITAL AF 1.2 CAL) 1,000 mL (01/25/18 1032)  . fentaNYL infusion INTRAVENOUS 300 mcg/hr (01/26/18 0600)  . magnesium sulfate 1 - 4 g bolus IVPB    . midazolam (VERSED) infusion 2 mg/hr (01/26/18 0600)  . norepinephrine (LEVOPHED) Adult infusion    . phenylephrine (NEO-SYNEPHRINE) Adult infusion 10 mcg/min (01/26/18 0600)  . potassium PHOSPHATE IVPB (in mmol) 30 mmol (01/26/18 0724)  . sodium chloride     PRN Meds: sodium chloride, sodium chloride, alum & mag hydroxide-simeth, fentaNYL, fentaNYL (SUBLIMAZE) injection, guaiFENesin-dextromethorphan, haloperidol lactate, hydrALAZINE, labetalol, magnesium sulfate 1 - 4 g bolus IVPB, metoprolol tartrate, midazolam, midazolam,  morphine injection, ondansetron (ZOFRAN) IV, phenol, sodium chloride flush   Vital Signs    Vitals:   01/26/18 0715 01/26/18 0745 01/26/18 0800 01/26/18 0808  BP: 122/76 106/66 116/71   Pulse: (!) 45 68 80 (!) 45  Resp: '20 16 20 18  ' Temp:      TempSrc:      SpO2: 100% 100% 100% 100%  Weight:      Height:        Intake/Output Summary (Last 24 hours) at 01/26/2018 0819 Last data filed at 01/26/2018 0800 Gross per 24 hour  Intake 4048.08 ml  Output 710 ml  Net 3338.08 ml   Filed Weights   01/23/18 1738 01/26/18 0402  Weight: 78.8 kg 84.5 kg    Telemetry    Atrial fibrillation.  Rate <100 bpm.  PVCs. - Personally Reviewed  ECG    01/24/18: Atrial fibrillation.  Rate 116 bpm.  Anterolateral ST depression.  - Personally Reviewed  Physical Exam   VS:  BP 116/71   Pulse (!) 45   Temp 98.5 F (36.9 C) (Oral)   Resp 18   Ht '6\' 1"'  (1.854 m)   Wt 84.5 kg   SpO2 100%   BMI 24.58 kg/m  , BMI Body mass index is 24.58 kg/m. GENERAL:  Critically ill-appearing.  Intubated and sedated HEENT: Anisocoria.  L pupil>R.  Fundi not visualized, oral mucosa unremarkable NECK:  No jugular venous distention, waveform within normal limits, carotid upstroke  brisk and symmetric, no bruits LUNGS:  Clear to auscultation bilaterally on anterior exam.  Vented breath sounds HEART:  Irregularly irregular.  PMI not displaced or sustained,S1 and S2 within normal limits, no S3, no S4, no clicks, no rubs, no murmurs ABD:  Flat, positive bowel sounds normal in frequency in pitch, no bruits, no rebound, no guarding, no midline pulsatile mass, no hepatomegaly, no splenomegaly EXT:  No edema.  L LE fasciotomy with wound vac. no cyanosis no clubbing SKIN:  No rashes no nodules NEURO: Moves all 4 extremities freely PSYCH:  Unable to assess  Labs    Chemistry Recent Labs  Lab 01/23/18 0908  01/24/18 0826 01/24/18 1810 01/25/18 0350 01/26/18 0522  NA 136   < > 138 138 140 141  K 4.0   < > 4.1 4.7  3.6 3.1*  CL 103   < > 103  --  108 112*  CO2 22  --  14*  --  16* 20*  GLUCOSE 233*   < > 302*  --  283* 179*  BUN 17   < > 12  --  13 13  CREATININE 1.05   < > 1.23  --  1.29* 0.99  CALCIUM 9.8  --  9.2  --  8.8* 8.1*  PROT 8.3*  --   --   --  5.8*  --   ALBUMIN 4.7  --   --   --  3.8  --   AST 24  --   --   --  218*  --   ALT 20  --   --   --  60*  --   ALKPHOS 73  --   --   --  37*  --   BILITOT 1.3*  --   --   --  1.9*  --   GFRNONAA >60  --  >60  --  >60 >60  GFRAA >60  --  >60  --  >60 >60  ANIONGAP 11  --  21*  --  16* 9   < > = values in this interval not displayed.     Hematology Recent Labs  Lab 01/24/18 2035 01/25/18 0350 01/26/18 0522  WBC 20.9* 17.6* 14.4*  RBC 3.51* 3.27* 2.93*  HGB 10.3* 9.8* 8.6*  HCT 33.0* 29.8* 26.6*  MCV 94.0 91.1 90.8  MCH 29.3 30.0 29.4  MCHC 31.2 32.9 32.3  RDW 13.9 13.9 14.0  PLT 114* 98* 89*    Cardiac Enzymes Recent Labs  Lab 01/25/18 1000 01/25/18 1611 01/26/18 0114  TROPONINI 1.57* 1.49* 1.15*   No results for input(s): TROPIPOC in the last 168 hours.   BNPNo results for input(s): BNP, PROBNP in the last 168 hours.   DDimer No results for input(s): DDIMER in the last 168 hours.   Radiology    Dg Abd 1 View  Result Date: 01/24/2018 CLINICAL DATA:  ET and OG tube placement EXAM: ABDOMEN - 1 VIEW COMPARISON:  None. FINDINGS: NG tube is seen to extend into the stomach with side port below the GE junction. Pulmonary edema pattern. IMPRESSION: NG tube in good position. Electronically Signed   By: Suzy Bouchard M.D.   On: 01/24/2018 22:44   Dg Chest Port 1 View  Result Date: 01/24/2018 CLINICAL DATA:  ET and OG tube placement EXAM: PORTABLE CHEST 1 VIEW COMPARISON:  01/24/2018 FINDINGS: Endotracheal tube 6.8 cm from carina in good position. NG tube extends in the course of the esophagus below the lower  margin the film. There is diffuse perihilar airspace disease.  No pneumothorax. IMPRESSION: 1. Endotracheal tube and NG  tube appear in good position. 2. Pulmonary edema pattern. Electronically Signed   By: Suzy Bouchard M.D.   On: 01/24/2018 22:40   Dg Chest Port 1 View  Result Date: 01/24/2018 CLINICAL DATA:  57 year old male with history of cough EXAM: PORTABLE CHEST 1 VIEW COMPARISON:  03/05/2016 FINDINGS: Cardiomediastinal silhouette unchanged in size and contour. No evidence of central vascular congestion. No pneumothorax. No confluent airspace disease. Similar appearance of diffusely coarsened interstitial markings. Evidence of emphysema with hyperinflation and flattened hemidiaphragms. IMPRESSION: Coarsened interstitial markings, appear chronic, with no evidence of lobar pneumonia. Emphysema Electronically Signed   By: Corrie Mckusick D.O.   On: 01/24/2018 12:08    Cardiac Studies   Echo 02/2016: Study Conclusions  - Left ventricle: The cavity size was normal. Wall thickness was   increased in a pattern of mild LVH. Systolic function was normal.   The estimated ejection fraction was in the range of 60% to 65%.   Wall motion was normal; there were no regional wall motion   abnormalities. The study was not technically sufficient to allow   evaluation of LV diastolic dysfunction due to atrial   fibrillation. - Aortic valve: Mildly calcified annulus. Trileaflet; mildly   thickened leaflets. - Mitral valve: Mildly thickened leaflets . There was mild   regurgitation. - Left atrium: The atrium was moderately dilated. - Right ventricle: Systolic function was mildly reduced. - Tricuspid valve: There was mild regurgitation.   Patient Profile     57 y.o. male with chronic atrial fibrillation, non-obstructive CAD, diabetes, hypertension, hyperlipidemia, and PAD status post recent peripheral bypass complicated by re-obstruction.  He is currently hospitalized with critical limb ischemia complicated by atrial fibrillation with rapid ventricular response.  Assessment & Plan    # Atrial fibrillation with  rapid ventricular response: IV diltiazem was stopped 2/2 hypotension and he was started on amiodarone.  Metoprolol has been ordered but not given 2/2 low BP.  Heart rate well-controlled.  Will switch amiodarone to 243m oral bid.  Heparin switched to bivalirudin due to concern for HIT.  # Elevated troponin:  He had non-obstructive CAD in 2009.  EKG changes were noted 12/6 with lateral ST depression.  Troponin was 1.57 and trending down.  Unable to assess patient for chest pain.  Echo pending.  He is on anticoagulation as above.  Will resume beta blocker when BP allows.  Consider ischemia evaluation when stable, especially if echo reveals reduced LVEF or focal wall motion abnormalities.   # Critical limb ischemia: # PAD: # Hyperlipidemia: Mr. DTosihad thrombosis of the below the knee popliteal bypass and underwent cutdown with graft thrombectomy and patch angioplasty 08/2017.  Went to the OR 12/6 for mechanical thrombectomy of the L femoropopliteal pypass with balloon angioplasty and stent placement in the L posterior tibial, L peroneal, L tibiopernoneal trunk, L common iliac and L exernal iliac artereis.  He had a compartment fasciotomy due to high risk for compartment syndrome.  Atorvastatin was increased to 862mthis admission.  Will need repeat lipids/CMP in 6-8 weeks.  Continue antiplatelets per Vascular Surgery.  # Hypotension: Phenylephrine was weaned off yesterday but now back on at 1526 He returned from the OR hypotensive and tachycardic to the 200s.   He was agitated and unabel to wean from the vent.  He recevied 1L IVF and epinephrine 50 mcg IV x1.  Currently on  phenylephrine and BP remains soft.  Pulmonary edema on CXR improved from 12/6.  He was net positive 4L yesterday.  No LE edema on exam.  Diurese when BP allows.  If he can't wean from pressors today will need central line.   # Tobacco abuse:  Suggest cessation.   # DM: Poorly controlled. Hgb A1c 7.6%.  He is only on glimepiride as  an outpatient.  Would strongly consider adding an SGLT2 inhibitor for mortality and CV benefit.  Time spent: 45 minutes-Greater than 50% of this time was spent in counseling, explanation of diagnosis, planning of further management, and coordination of care.      For questions or updates, please contact [place] Please consult [email or web address] for contact info under        Signed, Skeet Latch, MD  01/26/2018, 8:19 AM

## 2018-01-26 NOTE — Progress Notes (Addendum)
PULMONARY / CRITICAL CARE MEDICINE   NAME:  Willie Morales, MRN:  [id number], DOB:  [birth year], LOS: 3 ADMISSION DATE:  01/23/2018, CONSULTATION DATE:  01/24/2018 REFERRING MD:  Dr Myra Gianotti , CHIEF COMPLAINT:  resp failure   BRIEF HISTORY:    57 year old male with history of peripheral vascular disease and popliteal bypass adm  with thrombosed graft status post thrombectomy and multiple stents.   SIGNIFICANT PAST MEDICAL HISTORY   -Tobacco abuse -Diabetes mellitus -Atrial fibrillation on Eliquis -Hypertension -Peripheral vascular disease status post below-knee popliteal bypass March 2019 -Thrombosed graft underwent below-knee popliteal cutdown and graft thrombectomy July 2019  SIGNIFICANT EVENTS:   01/24/2018 >> mechanical thrombectomy of the left femoropopliteal bypass graft plus balloon angioplasty with multiple stents to the left posterior tibial artery, left peroneal artery, left tibioperoneal trunk, left common iliac artery and left external iliac artery patient also underwent compartment fasciotomy due to high risk for compartment syndrome. -Intubation 01/24/2018 12/7 on low dose neo gtt , CXR c/w edema , Agitated overnight requiring fent/ versed gtt Hypotensive with propofol ,Started on amio for AF-RVR  STUDIES:    CULTURES:    ANTIBIOTICS:    LINES/TUBES:  ETT 12/6 >>  CONSULTANTS:  Cards vascular SUBJECTIVE:   Remains critically ill, intubated, on low-dose neo-drip Agitated intermittent requiring increase in fentanyl and Versed drips Afebrile   CONSTITUTIONAL: BP 116/71   Pulse (!) 45   Temp 98.5 F (36.9 C) (Oral)   Resp 18   Ht 6\' 1"  (1.854 m)   Wt 84.5 kg   SpO2 100%   BMI 24.58 kg/m   I/O last 3 completed shifts: In: 10044.8 [I.V.:8099.2; Other:50; NG/GT:400; IV Piggyback:1495.6] Out: 3020 [Urine:2120; Emesis/NG output:500; Drains:400]     Vent Mode: PRVC FiO2 (%):  [40 %] 40 % Set Rate:  [20 bmp] 20 bmp Vt Set:  [409 mL] 630 mL PEEP:  [5  cmH20] 5 cmH20 Plateau Pressure:  [17 cmH20-24 cmH20] 17 cmH20  PHYSICAL EXAM: General: Intubated acutely ill on vent, int agitation per RN Neuro: sedated on versed/fent gtt RASS -3, was non focal prior HEENT: no pallor, icterus Cardiovascular: Atrial fibrillation irregularly irregular tachycardic Lungs: Decreased bilaterally no wheezing , no spontaneous breaths when put on meanwhile Abdomen: Soft no tenderness organomegaly Musculoskeletal: No lower limb edema,mottled left leg with fasciotomy + wond vac in his left lower limb pulses on Doppler Skin: No rash  Chest x-ray personally reviewed which shows clearing of bilateral infiltrates  RESOLVED PROBLEM LIST   ASSESSMENT AND PLAN   Assessment: Acute hypoxemic respiratory failure requiring mechanical ventilation -vent settings reviewed & adjusted -Spontaneous breathing trial once agitation controlled  Hypotension -Related to meds/intubation Low-dose neo-, expected tapered off DC benazepril -Await echo    Acute pulmonary edema -Would need to diuresis eventually once blood pressure permits, currently limited by hypotension , chest x-ray improved   Atrial fibrillation with rapid ventricular response Elevated troponin peak 1.5 -Added oral metoprolol per cardiology, p.o. amiodarone -Rate control may be the best strategy here   Peripheral vascular disease with thrombosed graft status post thrombectomy and multiple stents -Per vascular surgery Continue Plavix and bivalirudin Ancef empiric   -Acute kidney injury / rhabdomyolysis - ct NS @ 50/h Monitor CK Hypokalemia and hypophosphatemia will be repleted  Acute encephalopathy -Did not tolerate propofol due to severe hypotension, will attempt Precedex Continue fentanyl drip  goal RA SS -2 -Ct Haldol and minimize Versed drip -Add low-dose Risperdal for sundowning  Diabetes type 2, uncontrolled hyperglycemia -Ct Lantus  15 units, Moderate SSI   SUMMARY OF TODAY'S PLAN:   Pulmonary edema has improved but severe agitation needs to be controlled prior to extubation Needs fluids due to rhabdo Diuresis once blood pressure improves and off neo gtt     Best Practice / Goals of Care / Disposition.   DVT PROPHYLAXIS: bival SUP: PPI NUTRITION: TFs MOBILITY:*Bedrest GOALS OF CARE: Full code FAMILY DISCUSSIONS: No family around DISPOSITION ICU admission  The patient is critically ill with multiple organ systems failure and requires high complexity decision making for assessment and support, frequent evaluation and titration of therapies, application of advanced monitoring technologies and extensive interpretation of multiple databases. Critical Care Time devoted to patient care services described in this note independent of APP/resident  time is 35 minutes.     Cyril Mourningakesh Alva MD. Tonny BollmanFCCP. [facility] Pager [phone] If no response call [phone]   01/26/2018

## 2018-01-26 NOTE — Progress Notes (Signed)
ANTICOAGULATION CONSULT NOTE  Pharmacy Consult for Bivalirudin Indication: atrial fibrillation and thrombectomy  Allergies  Allergen Reactions  . Ciprofloxacin Rash    Patient Measurements: Height: 6\' 1"  (185.4 cm) Weight: 186 lb 4.6 oz (84.5 kg) IBW/kg (Calculated) : 79.9 Heparin Dosing Weight: 78.8 kg  Vital Signs: Temp: 98.5 F (36.9 C) (12/08 1223) Temp Source: Oral (12/08 1223) BP: 110/70 (12/08 1215) Pulse Rate: 70 (12/08 1215)  Labs: Recent Labs    01/24/18 0326 01/24/18 0826  01/24/18 1253  01/24/18 2035 01/24/18 2343 01/25/18 0350 01/25/18 0630 01/25/18 1000 01/25/18 1238 01/25/18 1611 01/26/18 0114 01/26/18 0522  HGB 14.1  --    < >  --    < > 10.3*  --  9.8*  --   --   --   --   --  8.6*  HCT 44.8  --    < >  --    < > 33.0*  --  29.8*  --   --   --   --   --  26.6*  PLT 171  --    < >  --   --  114*  --  98*  --   --   --   --   --  89*  APTT 32  --   --  29  --   --   --  34  --   --  50* 56*  --  57*  HEPARINUNFRC 0.60  --   --  <0.10*  --   --   --   --  0.23*  --   --   --   --   --   CREATININE  --  1.23  --   --   --   --   --  1.29*  --   --   --   --   --  0.99  CKTOTAL  --   --   --   --   --   --  8,2959,708*  --   --   --   --   --   --  9,063*  TROPONINI  --   --   --   --   --   --   --   --   --  1.57*  --  1.49* 1.15*  --    < > = values in this interval not displayed.    Estimated Creatinine Clearance: 95.3 mL/min (by C-G formula based on SCr of 0.99 mg/dL).   Medical History: Past Medical History:  Diagnosis Date  . Carotid artery disease (HCC)    a. 60-70% RICA, totally occluded LICA, > 50% stenosed bilat ECA 04/2017.  Marland Kitchen. Chronic atrial fibrillation   . Coronary artery disease    a. remote cath in 2009 showing 30% mRCA, 30% PDA, 45% LAD, 30% diag.  . Diabetes mellitus   . Dyslipidemia   . History of kidney stones   . Hypertension   . Tobacco abuse     Medications:  Infusions:  . sodium chloride    . sodium chloride 50 mL/hr  at 01/26/18 1200  . sodium chloride    . bivalirudin (ANGIOMAX) infusion 0.5 mg/mL (Non-ACS indications) 0.15 mg/kg/hr (01/26/18 1200)  . dexmedetomidine (PRECEDEX) IV infusion 0.6 mcg/kg/hr (01/26/18 1200)  . diltiazem (CARDIZEM) infusion Stopped (01/24/18 1336)  . diltiazem (CARDIZEM) infusion Stopped (01/24/18 1914)  . feeding supplement (VITAL AF 1.2 CAL) 20 mL/hr at 01/26/18 0800  . fentaNYL infusion  INTRAVENOUS 200 mcg/hr (01/26/18 1200)  . magnesium sulfate 1 - 4 g bolus IVPB    . midazolam (VERSED) infusion Stopped (01/26/18 1124)  . norepinephrine (LEVOPHED) Adult infusion    . phenylephrine (NEO-SYNEPHRINE) Adult infusion 10 mcg/min (01/26/18 1200)  . potassium PHOSPHATE IVPB (in mmol) 85 mL/hr at 01/26/18 1200  . sodium chloride      Assessment: 57 yo male previously on heparin and TPA for thrombolysis of L leg critical ischemia.  Platelet count with downward trend today.  HIT unlikely, but pharmacy asked to switch heparin to bivalirudin just in case.  Also asked to increase to full dose anticoagulation 12/7.  This morning's aPTT on bivalirudin = 57, at goal.  No overt bleeding or complications noted.  Goal of Therapy:  APTT 50-85 sec Monitor platelets by anticoagulation protocol: Yes   Plan:  Continue bivalirudin 0.15 mg/kg/hr Daily aPTT, CBC. F/u plans for oral anticoagulation as able.  Jenetta Downer, [facility] Clinical Pharmacist Phone [phone]  01/26/2018 12:46 PM

## 2018-01-26 NOTE — Progress Notes (Signed)
Pt placed on bipap after self extubation.  RT to monitor.

## 2018-01-26 NOTE — Progress Notes (Signed)
2250 Leitha BleakKatalina NP for CCM at bedside. Discussed discrepancy between a-line and cuff blood pressures. Instructions given to use cuff blood pressures.  2300 RN called and discussed scheduled medications with Dr. Dellie CatholicSommers. Instructions to hold medications at this time. Updated on patient status.

## 2018-01-26 NOTE — Progress Notes (Signed)
eLink Physician-Brief Progress Note Patient Name: Willie CarmelGeorge A Harkleroad DOB: 06/06/1960 MRN: [id number]   Date of Service  01/26/2018  HPI/Events of Note  Agitation - Request to renew restraint orders.   eICU Interventions  Will renew restraint orders.      Intervention Category Minor Interventions: Agitation / anxiety - evaluation and management  Sommer,Steven Eugene 01/26/2018, 7:42 PM

## 2018-01-26 NOTE — Progress Notes (Signed)
Spoke with MD Vassie LollAlva. Aware of current hemodynamics, urine output, sedation level, and sedation medications. Will maintain fentanyl gtt and collaborate with respiratory therapy to wean ventilator as patient tolerates.  Norva KarvonenPhillip M Oaklee Sunga, RN

## 2018-01-27 ENCOUNTER — Encounter (HOSPITAL_COMMUNITY): Payer: Self-pay | Admitting: Surgery

## 2018-01-27 ENCOUNTER — Inpatient Hospital Stay (HOSPITAL_COMMUNITY): Payer: BLUE CROSS/BLUE SHIELD

## 2018-01-27 DIAGNOSIS — D72829 Elevated white blood cell count, unspecified: Secondary | ICD-10-CM

## 2018-01-27 LAB — BASIC METABOLIC PANEL
Anion gap: 12 (ref 5–15)
BUN: 11 mg/dL (ref 6–20)
CO2: 18 mmol/L — ABNORMAL LOW (ref 22–32)
Calcium: 7.8 mg/dL — ABNORMAL LOW (ref 8.9–10.3)
Chloride: 110 mmol/L (ref 98–111)
Creatinine, Ser: 0.97 mg/dL (ref 0.61–1.24)
GFR calc Af Amer: >60 mL/min (ref >60)
GFR calc non Af Amer: >60 mL/min (ref >60)
Glucose, Bld: 256 mg/dL — ABNORMAL HIGH (ref 70–99)
Potassium: 3.5 mmol/L (ref 3.5–5.1)
Sodium: 140 mmol/L (ref 135–145)

## 2018-01-27 LAB — POCT I-STAT 3, ART BLOOD GAS (G3+)
Acid-base deficit: 7 mmol/L — ABNORMAL HIGH (ref 0.0–2.0)
Bicarbonate: 18.4 mmol/L — ABNORMAL LOW (ref 20.0–28.0)
O2 Saturation: 91 %
Patient temperature: 37
TCO2: 20 mmol/L — ABNORMAL LOW (ref 22–32)
pCO2 arterial: 37.8 mmHg (ref 32.0–48.0)
pH, Arterial: 7.296 — ABNORMAL LOW (ref 7.350–7.450)
pO2, Arterial: 66 mmHg — ABNORMAL LOW (ref 83.0–108.0)

## 2018-01-27 LAB — CBC
HCT: 29.4 % — ABNORMAL LOW (ref 39.0–52.0)
Hemoglobin: 9.2 g/dL — ABNORMAL LOW (ref 13.0–17.0)
MCH: 29.3 pg (ref 26.0–34.0)
MCHC: 31.3 g/dL (ref 30.0–36.0)
MCV: 93.6 fL (ref 80.0–100.0)
Platelets: 91 10*3/uL — ABNORMAL LOW (ref 150–400)
RBC: 3.14 MIL/uL — ABNORMAL LOW (ref 4.22–5.81)
RDW: 14.2 % (ref 11.5–15.5)
WBC: 11.7 10*3/uL — ABNORMAL HIGH (ref 4.0–10.5)
nRBC: 0 % (ref 0.0–0.2)

## 2018-01-27 LAB — CK: Total CK: 6073 U/L — ABNORMAL HIGH (ref 49–397)

## 2018-01-27 LAB — APTT: aPTT: 58 seconds — ABNORMAL HIGH (ref 24–36)

## 2018-01-27 LAB — MAGNESIUM: Magnesium: 1.7 mg/dL (ref 1.7–2.4)

## 2018-01-27 LAB — GLUCOSE, CAPILLARY
Glucose-Capillary: 155 mg/dL — ABNORMAL HIGH (ref 70–99)
Glucose-Capillary: 164 mg/dL — ABNORMAL HIGH (ref 70–99)
Glucose-Capillary: 174 mg/dL — ABNORMAL HIGH (ref 70–99)
Glucose-Capillary: 177 mg/dL — ABNORMAL HIGH (ref 70–99)
Glucose-Capillary: 188 mg/dL — ABNORMAL HIGH (ref 70–99)
Glucose-Capillary: 230 mg/dL — ABNORMAL HIGH (ref 70–99)

## 2018-01-27 LAB — PHOSPHORUS: Phosphorus: 3.6 mg/dL (ref 2.5–4.6)

## 2018-01-27 MED ORDER — ADULT MULTIVITAMIN W/MINERALS CH
1.0000 | ORAL_TABLET | Freq: Every day | ORAL | Status: DC
  Administered 2018-01-28 – 2018-01-31 (×4): 1 via ORAL
  Filled 2018-01-27 (×4): qty 1

## 2018-01-27 MED ORDER — DOCUSATE SODIUM 100 MG PO CAPS
100.0000 mg | ORAL_CAPSULE | Freq: Every day | ORAL | Status: DC
  Administered 2018-01-27 – 2018-01-31 (×5): 100 mg via ORAL
  Filled 2018-01-27 (×5): qty 1

## 2018-01-27 MED ORDER — METOPROLOL TARTRATE 25 MG PO TABS
25.0000 mg | ORAL_TABLET | Freq: Two times a day (BID) | ORAL | Status: DC
  Administered 2018-01-27 – 2018-01-31 (×8): 25 mg via ORAL
  Filled 2018-01-27 (×9): qty 1

## 2018-01-27 MED ORDER — IPRATROPIUM-ALBUTEROL 0.5-2.5 (3) MG/3ML IN SOLN
3.0000 mL | RESPIRATORY_TRACT | Status: DC
  Administered 2018-01-27 – 2018-01-29 (×9): 3 mL via RESPIRATORY_TRACT
  Filled 2018-01-27 (×9): qty 3

## 2018-01-27 MED ORDER — CLOPIDOGREL BISULFATE 75 MG PO TABS
75.0000 mg | ORAL_TABLET | Freq: Every day | ORAL | Status: DC
  Administered 2018-01-27 – 2018-01-31 (×5): 75 mg via ORAL
  Filled 2018-01-27 (×5): qty 1

## 2018-01-27 MED ORDER — AMIODARONE IV BOLUS ONLY 150 MG/100ML: 150.0000 mg | Freq: Once | INTRAVENOUS | Status: DC

## 2018-01-27 MED ORDER — POTASSIUM CHLORIDE CRYS ER 20 MEQ PO TBCR
40.0000 meq | EXTENDED_RELEASE_TABLET | Freq: Once | ORAL | Status: AC
  Administered 2018-01-27: 40 meq via ORAL
  Filled 2018-01-27: qty 2

## 2018-01-27 MED ORDER — RISPERIDONE 1 MG PO TABS
1.0000 mg | ORAL_TABLET | Freq: Every day | ORAL | Status: DC
  Administered 2018-01-27 – 2018-01-30 (×4): 1 mg via ORAL
  Filled 2018-01-27 (×6): qty 1

## 2018-01-27 MED ORDER — ATORVASTATIN CALCIUM 80 MG PO TABS
80.0000 mg | ORAL_TABLET | Freq: Every day | ORAL | Status: DC
  Administered 2018-01-27 – 2018-01-30 (×4): 80 mg via ORAL
  Filled 2018-01-27 (×4): qty 1

## 2018-01-27 MED ORDER — ENSURE ENLIVE PO LIQD: 237.0000 mL | Freq: Two times a day (BID) | ORAL | Status: DC

## 2018-01-27 MED ORDER — GABAPENTIN 300 MG PO CAPS
300.0000 mg | ORAL_CAPSULE | Freq: Three times a day (TID) | ORAL | Status: DC
  Administered 2018-01-27 – 2018-01-31 (×13): 300 mg via ORAL
  Filled 2018-01-27 (×13): qty 1

## 2018-01-27 MED ORDER — PANTOPRAZOLE SODIUM 40 MG PO TBEC
40.0000 mg | DELAYED_RELEASE_TABLET | Freq: Every day | ORAL | Status: DC
  Administered 2018-01-27 – 2018-01-31 (×5): 40 mg via ORAL
  Filled 2018-01-27 (×5): qty 1

## 2018-01-27 MED ORDER — METOCLOPRAMIDE HCL 5 MG/ML IJ SOLN
5.0000 mg | Freq: Four times a day (QID) | INTRAMUSCULAR | Status: DC | PRN
  Administered 2018-01-27: 5 mg via INTRAVENOUS
  Filled 2018-01-27 (×2): qty 2

## 2018-01-27 MED ORDER — ASPIRIN 81 MG PO CHEW
81.0000 mg | CHEWABLE_TABLET | Freq: Every day | ORAL | Status: DC
  Administered 2018-01-27 – 2018-01-30 (×4): 81 mg via ORAL
  Filled 2018-01-27 (×5): qty 1

## 2018-01-27 MED ORDER — AMIODARONE LOAD VIA INFUSION
150.0000 mg | Freq: Once | INTRAVENOUS | Status: AC
  Administered 2018-01-27: 150 mg via INTRAVENOUS
  Filled 2018-01-27: qty 83.34

## 2018-01-27 NOTE — Progress Notes (Signed)
ANTICOAGULATION CONSULT NOTE  Pharmacy Consult for Bivalirudin Indication: atrial fibrillation and thrombectomy  Allergies  Allergen Reactions  . Ciprofloxacin Rash    Patient Measurements: Height: 6\' 1"  (185.4 cm) Weight: 186 lb 4.6 oz (84.5 kg) IBW/kg (Calculated) : 79.9 Heparin Dosing Weight: 78.8 kg  Vital Signs: Temp: 96.5 F (35.8 C) (12/09 0700) Temp Source: Axillary (12/09 0700) BP: 134/78 (12/09 0815) Pulse Rate: 134 (12/09 0826)  Labs: Recent Labs    01/24/18 1253  01/24/18 2343 01/25/18 0350 01/25/18 0630 01/25/18 1000  01/25/18 1611 01/26/18 0114 01/26/18 0522 01/27/18 0349  HGB  --    < >  --  9.8*  --   --   --   --   --  8.6* 9.2*  HCT  --    < >  --  29.8*  --   --   --   --   --  26.6* 29.4*  PLT  --    < >  --  98*  --   --   --   --   --  89* 91*  APTT 29  --   --  34  --   --    < > 56*  --  57* 58*  HEPARINUNFRC <0.10*  --   --   --  0.23*  --   --   --   --   --   --   CREATININE  --   --   --  1.29*  --   --   --   --   --  0.99 0.97  CKTOTAL  --   --  4,0989,708*  --   --   --   --   --   --  9,063* 6,073*  TROPONINI  --   --   --   --   --  1.57*  --  1.49* 1.15*  --   --    < > = values in this interval not displayed.    Estimated Creatinine Clearance: 97.2 mL/min (by C-G formula based on SCr of 0.97 mg/dL).   Medical History: Past Medical History:  Diagnosis Date  . Carotid artery disease (HCC)    a. 60-70% RICA, totally occluded LICA, > 50% stenosed bilat ECA 04/2017.  Marland Kitchen. Chronic atrial fibrillation   . Coronary artery disease    a. remote cath in 2009 showing 30% mRCA, 30% PDA, 45% LAD, 30% diag.  . Diabetes mellitus   . Dyslipidemia   . History of kidney stones   . Hypertension   . Tobacco abuse     Medications:  Infusions:  . sodium chloride    . sodium chloride 50 mL/hr at 01/27/18 0620  . sodium chloride    . amiodarone 30 mg/hr (01/27/18 0600)  . bivalirudin (ANGIOMAX) infusion 0.5 mg/mL (Non-ACS indications) 0.15  mg/kg/hr (01/27/18 0805)  . magnesium sulfate 1 - 4 g bolus IVPB    . norepinephrine (LEVOPHED) Adult infusion    . phenylephrine (NEO-SYNEPHRINE) Adult infusion 20 mcg/min (01/27/18 0600)  . sodium chloride      Assessment: 57 yo male previously on heparin and TPA for thrombolysis of L leg critical ischemia.  Platelet count remains low, but stable.  HIT unlikely, but pharmacy asked to switch heparin to bivalirudin just in case.  Also asked to increase to full dose anticoagulation 12/7.  HIT antibody this AM negative.  This morning's aPTT on bivalirudin = 58, at goal.  No overt bleeding or  complications noted.  Goal of Therapy:  APTT 50-85 sec Monitor platelets by anticoagulation protocol: Yes   Plan:  Continue bivalirudin 0.15 mg/kg/hr Daily aPTT, CBC. F/u plans for oral anticoagulation as able.  Will the plan be for Eliquis + ASA + Plavix?  Or could ASA be stopped?  Jenetta Downer, [facility] Clinical Pharmacist Phone [phone]  01/27/2018 8:31 AM

## 2018-01-27 NOTE — Progress Notes (Signed)
 Progress Note  Patient Name: Willie Morales Date of Encounter: 01/27/2018  Primary Cardiologist: Muhammad Arida, MD   Subjective   Pt awake this am. No complaints.   Inpatient Medications    Scheduled Meds: . amiodarone  150 mg Intravenous Once  . aspirin  81 mg Per Tube Daily  . atorvastatin  80 mg Per Tube q1800  . cephALEXin  500 mg Oral Q8H  . chlorhexidine gluconate (MEDLINE KIT)  15 mL Mouth Rinse BID  . Chlorhexidine Gluconate Cloth  6 each Topical Daily  . clopidogrel  75 mg Per Tube Daily  . docusate  100 mg Per Tube Daily  . gabapentin  300 mg Per Tube TID  . insulin aspart  0-15 Units Subcutaneous Q4H  . insulin glargine  15 Units Subcutaneous Daily  . levothyroxine  25 mcg Oral QAC breakfast  . mouth rinse  15 mL Mouth Rinse 10 times per day  . metoprolol tartrate  25 mg Per Tube BID  . pantoprazole sodium  40 mg Per Tube Daily  . risperiDONE  1 mg Per Tube Daily  . sodium chloride flush  10-40 mL Intracatheter Q12H   Continuous Infusions: . sodium chloride    . sodium chloride 50 mL/hr at 01/27/18 0620  . sodium chloride    . amiodarone 30 mg/hr (01/27/18 0600)  . bivalirudin (ANGIOMAX) infusion 0.5 mg/mL (Non-ACS indications) 0.15 mg/kg/hr (01/27/18 0600)  . magnesium sulfate 1 - 4 g bolus IVPB    . norepinephrine (LEVOPHED) Adult infusion    . phenylephrine (NEO-SYNEPHRINE) Adult infusion 20 mcg/min (01/27/18 0600)  . sodium chloride     PRN Meds: sodium chloride, sodium chloride, alum & mag hydroxide-simeth, fentaNYL (SUBLIMAZE) injection, guaiFENesin-dextromethorphan, haloperidol lactate, hydrALAZINE, labetalol, magnesium sulfate 1 - 4 g bolus IVPB, metoprolol tartrate, morphine injection, ondansetron (ZOFRAN) IV, phenol, sodium chloride flush, sodium chloride flush   Vital Signs    Vitals:   01/27/18 0615 01/27/18 0630 01/27/18 0645 01/27/18 0700  BP: 129/87 118/80 108/74 115/80  Pulse: (!) 109 62 (!) 104 (!) 104  Resp: 16 11 11 11  Temp:        TempSrc:      SpO2: 98% 98% 97% 100%  Weight:      Height:        Intake/Output Summary (Last 24 hours) at 01/27/2018 0753 Last data filed at 01/27/2018 0615 Gross per 24 hour  Intake 3906.3 ml  Output 1607 ml  Net 2299.3 ml   Filed Weights   01/23/18 1738 01/26/18 0402 01/27/18 0400  Weight: 78.8 kg 84.5 kg 84.5 kg    Telemetry    Atrial fibrillation, rate 130s - Personally Reviewed  ECG    No AM EKG   Physical Exam   GEN: No acute distress.   Neck: No JVD Cardiac: Irreg irreg. Tachy. No murmurs, rubs, or gallops.  Respiratory: Clear to auscultation bilaterally. GI: Soft, nontender, non-distended  Ext: No LE edema.  Neuro:  Nonfocal  Psych: Normal affect   Labs    Chemistry Recent Labs  Lab 01/23/18 0908  01/25/18 0350 01/26/18 0522 01/27/18 0349  NA 136   < > 140 141 140  K 4.0   < > 3.6 3.1* 3.5  CL 103   < > 108 112* 110  CO2 22   < > 16* 20* 18*  GLUCOSE 233*   < > 283* 179* 256*  BUN 17   < > 13 13 11  CREATININE 1.05   < >   1.29* 0.99 0.97  CALCIUM 9.8   < > 8.8* 8.1* 7.8*  PROT 8.3*  --  5.8*  --   --   ALBUMIN 4.7  --  3.8  --   --   AST 24  --  218*  --   --   ALT 20  --  60*  --   --   ALKPHOS 73  --  37*  --   --   BILITOT 1.3*  --  1.9*  --   --   GFRNONAA >60   < > >60 >60 >60  GFRAA >60   < > >60 >60 >60  ANIONGAP 11   < > 16* 9 12   < > = values in this interval not displayed.     Hematology Recent Labs  Lab 01/25/18 0350 01/26/18 0522 01/27/18 0349  WBC 17.6* 14.4* 11.7*  RBC 3.27* 2.93* 3.14*  HGB 9.8* 8.6* 9.2*  HCT 29.8* 26.6* 29.4*  MCV 91.1 90.8 93.6  MCH 30.0 29.4 29.3  MCHC 32.9 32.3 31.3  RDW 13.9 14.0 14.2  PLT 98* 89* 91*    Cardiac Enzymes Recent Labs  Lab 01/25/18 1000 01/25/18 1611 01/26/18 0114  TROPONINI 1.57* 1.49* 1.15*   No results for input(s): TROPIPOC in the last 168 hours.   BNPNo results for input(s): BNP, PROBNP in the last 168 hours.   DDimer No results for input(s): DDIMER in the  last 168 hours.   Radiology    Dg Chest Port 1 View  Result Date: 01/26/2018 CLINICAL DATA:  PICC line placement EXAM: PORTABLE CHEST 1 VIEW COMPARISON:  01/26/2018 at 0444 hours. FINDINGS: 1320 hours. Endotracheal tube terminates 4.9 cm above carina. Nasogastric tube extends beyond the inferior aspect of the film. Right-sided PICC line terminates at the superior caval/atrial junction. Borderline cardiomegaly. Right costophrenic angle excluded. No pneumothorax. Moderate interstitial edema is similar. Persistent bibasilar airspace disease. IMPRESSION: Right-sided PICC line terminating at the superior caval/atrial junction. Reposition of endotracheal tube, appropriate. Similar congestive heart failure and bibasilar atelectasis. Electronically Signed   By: Abigail Miyamoto M.D.   On: 01/26/2018 13:54   Dg Chest Port 1 View  Result Date: 01/26/2018 CLINICAL DATA:  Acute respiratory failure. EXAM: PORTABLE CHEST 1 VIEW COMPARISON:  01/24/2018 FINDINGS: Endotracheal tube terminates at the level of the clavicles, 9.0 cm above the carina. Nasogastric terminates at the body of the stomach. Mild cardiomegaly. Atherosclerosis in the transverse aorta. No pleural effusion or pneumothorax. Improved interstitial edema with mild pulmonary venous congestion remaining. No lobar consolidation. IMPRESSION: Endotracheal tube high in position.  Consider advancement 2-4 cm. Improved aeration with mild pulmonary venous congestion remaining. Aortic Atherosclerosis (ICD10-I70.0). These results will be called to the ordering clinician or representative by the Radiologist Assistant, and communication documented in the PACS or zVision Dashboard. Electronically Signed   By: Abigail Miyamoto M.D.   On: 01/26/2018 09:25   [place] Ekg Site Rite  Result Date: 01/26/2018 If Site Rite image not attached, placement could not be confirmed due to current cardiac rhythm.   Cardiac Studies   Echo 01/26/18: Left ventricle: The cavity size was normal.  Wall thickness was   increased in a pattern of mild LVH. Systolic function was normal.   The estimated ejection fraction was in the range of 55% to 60%.   Wall motion was normal; there were no regional wall motion   abnormalities. The study was not technically sufficient to allow   evaluation of LV diastolic dysfunction due  to atrial   fibrillation. - Mitral valve: There was moderate regurgitation. - Left atrium: The atrium was severely dilated. - Right ventricle: Systolic function was mildly reduced. - Atrial septum: No defect or patent foramen ovale was identified. - Tricuspid valve: There was mild regurgitation. - Systemic veins: Unable to accurately assess IVC as patient is on   ventilator.  Patient Profile     57 y.o. male with chronic atrial fibrillation, non-obstructive CAD, diabetes, hypertension, hyperlipidemia, and PAD status post recent peripheral bypass complicated by re-obstruction.  He is currently hospitalized with critical limb ischemia complicated by atrial fibrillation with rapid ventricular response.  Assessment & Plan    1. Atrial fibrillation with rapid ventricular response: He is in atrial fib this am with elevated rates. Neo-synephrine restarted overnight. He remains on IV amiodarone and po metoprolol. Heparin has been changed to bivalirudin due to concern for HIT. Would continue IV amiodarone today and attempt to wean Neo-synephrine.   2. Elevated troponin: He is known to have CAD. Mild troponin elevation on admission. Echo 01/26/18 with normal wall motion, normal LV systolic function, GYJE=56-31%. Will plan ischemic evaluation when his acute issues are stable.   3. PAD/Critical Limb ischemia: Mr. Geraldo had thrombosis of the below the knee popliteal bypass and underwent cutdown with graft thrombectomy and patch angioplasty 08/2017.  Went to the OR 12/6 for mechanical thrombectomy of the L femoropopliteal bypass with balloon angioplasty and stent placement in the L  posterior tibial, L peroneal, L tibiopernoneal trunk, L common iliac and L exernal iliac artereis.  He had a compartment fasciotomy due to high risk for compartment syndrome.VVS following.     For questions or updates, please contact [place] Please consult [email or web address] for contact info under        Signed, Lauree Chandler, MD  01/27/2018, 7:53 AM

## 2018-01-27 NOTE — Consult Note (Signed)
WOC Nurse wound consult note Reason for Consult: replacement of NPWT dressing to the LE fasciotomy sites Contacted bedside nurse for supplies Wound type: surgical  Pressure Injury POA: NA Measurement:did not measure, plans for closure on Wednesday. If not closed and potential for long term VAC needs I will measure at that time.  Wound ZOX:WRUEAbed:clean, pink, viable muscle Drainage (amount, consistency, odor) serosanguinous in the canister Periwound: intact, edema Dressing procedure/placement/frequency:  Filled right lateral and left lateral with 1 piece of silicone non-adherant to each wound and 1  piece of black foam to each wound bed Sealed NPWT dressing at 100mm HG/15600mmHG  Patient refused IV/PO pain medication per bedside nurse prior to dressing change Patient tolerated procedure however did begin to vomit during dressing change, he had been vomiting prior to my arrival. He does require switch to NRB while I am in the room for low saturations.     WOC nurse will continue to provide NPWT dressing changed due to the complexity of the dressing change. Plans for return to the OR Wednesday, WOC nurse team will follow along for support with wound care as needed M/W/F.  Willie Morales [facility] MSN,RN,CWOCN, CNS, [employer] [phone]

## 2018-01-27 NOTE — Progress Notes (Signed)
Nutrition Follow Up Note  DOCUMENTATION CODES:   Not applicable  INTERVENTION:   RD will order supplements when diet advanced  Recommend MVI daily  NUTRITION DIAGNOSIS:   Increased nutrient needs related to post-op healing as evidenced by increased estimated needs.  GOAL:   Patient will meet greater than or equal to 90% of their needs  MONITOR:   Diet advancement, Labs, Weight trends, Skin, I & O's  ASSESSMENT:   Patient with PMH significant for DM, HTN, HLD, tobacco abuse, CAD, and extensive PAD s/p below-knee popliteal bypass (04/2017). Presents this admission with acute on chronic limb ischemia.    Pt self extubated 12/8; tolerating well. Pt having intermittent periods of agitation and alertness, has been able to text on phone. Pt sleeping at time of RD visit today. Spoke with RN, plan is to try and initiate oral diet today after MD rounding. RN feels pt will be able to eat. RD will add supplements and MVI once diet ordered to help pt meet his estimated needs and encourage healing. Pt s/p fasciotomy with VAC placement; plan is to possibly close site on Wednesday. Per chart, pt with ~13lb weight gain since admit; pt + 12.7L on his I & Os. RD will continue to monitor; pt is noted to have pulmonary edema. PICC line in place. Plan for wound VAC change today per MD note.   Medications reviewed and include: aspirin, cephalexin, plavix, colace, insulin, synthroid, protonix, NaCl @50ml /hr, neosynephrine   Labs reviewed: K 3.5 wnl, P 3.6 wnl, Mg 1.7 wnl Wbc- 11.7(H), Hgb 9.2(L), Hct 29.4(L) cbgs- 155, 230, 164 x 24 hrs AIC- 7.6(H)- 12/6  Diet Order:   Diet Order            Diet NPO time specified  Diet effective now             EDUCATION NEEDS:   Not appropriate for education at this time  Skin:  Incision L leg and right groin, VAC  Last BM:  01/22/18  Height:   Ht Readings from Last 1 Encounters:  01/23/18 6\' 1"  (1.854 m)    Weight:   Wt Readings from Last 1  Encounters:  01/27/18 84.5 kg    Ideal Body Weight:  83.6 kg  BMI:  Body mass index is 24.58 kg/m.  Estimated Nutritional Needs:   Kcal:  2300-2600kcal/day   Protein:  110-126g/day   Fluid:  per MD  Betsey Holidayasey Katelynn Heidler MS, RD, LDN Pager #- [phone] Office#- [phone] After Hours Pager: [phone]

## 2018-01-27 NOTE — Progress Notes (Signed)
    Subjective  - POD #3,4  Self extubated Wants to get to see his dog   Physical Exam:  Brisk PT signal Wound vac in place       Assessment/Plan:    Appreciate assistance from cardiology and CCM On angiomax for AFIB, still RVR Plavix and ASA for LE stent Plan wound vac change at bedside today, possible fasciotomy closure on Wednesday  Willie Morales 01/27/2018 8:22 AM --  Vitals:   01/27/18 0813 01/27/18 0815  BP:  134/78  Pulse: (!) 124 89  Resp: 13 17  Temp:    SpO2: 95% 96%    Intake/Output Summary (Last 24 hours) at 01/27/2018 0822 Last data filed at 01/27/2018 0615 Gross per 24 hour  Intake 3450.32 ml  Output 1557 ml  Net 1893.32 ml     Laboratory CBC    Component Value Date/Time   WBC 11.7 (H) 01/27/2018 0349   HGB 9.2 (L) 01/27/2018 0349   HCT 29.4 (L) 01/27/2018 0349   PLT 91 (L) 01/27/2018 0349    BMET    Component Value Date/Time   NA 140 01/27/2018 0349   K 3.5 01/27/2018 0349   CL 110 01/27/2018 0349   CO2 18 (L) 01/27/2018 0349   GLUCOSE 256 (H) 01/27/2018 0349   BUN 11 01/27/2018 0349   CREATININE 0.97 01/27/2018 0349   CALCIUM 7.8 (L) 01/27/2018 0349   GFRNONAA >60 01/27/2018 0349   GFRAA >60 01/27/2018 0349    COAG Lab Results  Component Value Date   INR 1.24 01/23/2018   INR 1.13 09/17/2017   INR 1.11 05/09/2017   No results found for: PTT  Antibiotics Anti-infectives (From admission, onward)   Start     Dose/Rate Route Frequency Ordered Stop   01/25/18 0930  cephALEXin (KEFLEX) capsule 500 mg     500 mg Oral Every 8 hours 01/25/18 0919     01/24/18 2130  ceFAZolin (ANCEF) IVPB 2g/100 mL premix     2 g 200 mL/hr over 30 Minutes Intravenous Every 8 hours 01/24/18 2000 01/25/18 0657   01/24/18 1500  ceFAZolin (ANCEF) IVPB 2g/100 mL premix     2 g 200 mL/hr over 30 Minutes Intravenous  Once 01/24/18 1453 01/24/18 1532   01/24/18 1454  ceFAZolin (ANCEF) 2-4 GM/100ML-% IVPB    Note to Pharmacy:  Sabino NiemannHogue, Willie   :  cabinet override      01/24/18 1454 01/24/18 1532       Willie Morales IV, M.D. [facility] Office: [phone] Pager:  [phone]

## 2018-01-27 NOTE — Progress Notes (Signed)
Spoke to Pt's Sister Willie Morales on the phone who has CONFIRMED that pt's [facility] [facility] is currently being well taken care of and is safe and well fed at this time. According to pt's sister Willie Morales, she states, " He is always confused, that is his baseline. He has lost everything and that dog is all he has left. One of his friends is taking care of his dog. If he keeps asking about his dog, please assure him that he is being well taken care of."   Willie Morales states, " Pt and pt's wife are NOT legally separated YET, but it has been pretty nasty." Willie Morales states, " Do not bring Pt's wife up to him as it really agitates him and his mood can turn quickly."  Will continue to monitor.  [name], RN

## 2018-01-27 NOTE — Progress Notes (Signed)
eLink Physician-Brief Progress Note Patient Name: Willie Morales DOB: 12/31/1960 MRN: [id number]   Date of Service  01/27/2018  HPI/Events of Note  AFIB with RVR - Ventricular rate = 142. BP = 104/52 with MAP = 68. Currently on an Amiodarone IV infusion.   eICU Interventions  Will order: 1. Bolus with Amiodarone 150 mg IV over 10 minutes now.  2. Send AM labs including BMP and Mg++ level now.      Intervention Category Major Interventions: Arrhythmia - evaluation and management  Ronniesha Seibold Eugene 01/27/2018, 3:39 AM

## 2018-01-27 NOTE — Progress Notes (Signed)
RSI kit pulled from [employer] and 59m Atomidate drawn up for possible reintubation; not used, Atomidate wasted in medical waste with KZelphia Cairoas witness

## 2018-01-27 NOTE — Progress Notes (Signed)
A-line now correlating with blood pressure cuff

## 2018-01-27 NOTE — Progress Notes (Deleted)
Pt self extubated

## 2018-01-27 NOTE — Progress Notes (Signed)
PULMONARY / CRITICAL CARE MEDICINE   NAME:  Willie Morales, MRN:  [id number], DOB:  04/12/1960, LOS: 4 ADMISSION DATE:  01/23/2018, CONSULTATION DATE:  01/24/2018 REFERRING MD:  Dr Myra Gianotti , CHIEF COMPLAINT:  resp failure   BRIEF HISTORY:    57 year old male with history of peripheral vascular disease and popliteal bypass adm  with thrombosed graft status post thrombectomy and multiple stents.   SIGNIFICANT PAST MEDICAL HISTORY   -Tobacco abuse -Diabetes mellitus -Atrial fibrillation on Eliquis -Hypertension -Peripheral vascular disease status post below-knee popliteal bypass March 2019 -Thrombosed graft underwent below-knee popliteal cutdown and graft thrombectomy July 2019  SIGNIFICANT EVENTS:   01/24/2018 >> mechanical thrombectomy of the left femoropopliteal bypass graft plus balloon angioplasty with multiple stents to the left posterior tibial artery, left peroneal artery, left tibioperoneal trunk, left common iliac artery and left external iliac artery patient also underwent compartment fasciotomy due to high risk for compartment syndrome. -Intubation 01/24/2018 12/7 on low dose neo gtt , CXR c/w edema , Agitated overnight requiring fent/ versed gtt Hypotensive with propofol ,Started on amio for AF-RVR  STUDIES:    CULTURES:    ANTIBIOTICS:    LINES/TUBES:  ETT 12/6 >>  CONSULTANTS:  Cards vascular SUBJECTIVE:   No events overnight, extubated earlier today  CONSTITUTIONAL: BP 124/70   Pulse 83   Temp 98.2 F (36.8 C) (Oral)   Resp 12   Ht 6\' 1"  (1.854 m)   Wt 84.5 kg   SpO2 99%   BMI 24.58 kg/m   I/O last 3 completed shifts: In: 5819.5 [P.O.:140; I.V.:4503.8; NG/GT:540; IV Piggyback:635.8] Out: 1957 [Urine:1057; Emesis/NG output:700; Drains:200]  Vent Mode: BIPAP;PCV FiO2 (%):  [40 %] 40 % Set Rate:  [18 bmp-20 bmp] 18 bmp Vt Set:  [630 mL] 630 mL PEEP:  [5 cmH20-8 cmH20] 8 cmH20 Plateau Pressure:  [18 cmH20] 18 cmH20  PHYSICAL EXAM: General:  Extubated, awake and following commands Neuro: Alert and interactive, moving all ext to command HEENT: [place]/AT, PERRL, EOM-I and MMM Cardiovascular: IRIR, Nl S1/S2 and -M/R/G Lungs: CTA bilaterally Abdomen: Soft, NT, ND and +BS Musculoskeletal: No lower limb edema,mottled left leg with fasciotomy + wond vac in his left lower limb pulses on Doppler Skin: No rash  Chest x-ray personally reviewed which shows clearing of bilateral infiltrates  RESOLVED PROBLEM LIST   ASSESSMENT AND PLAN   Assessment: Acute hypoxemic respiratory failure requiring mechanical ventilation - Extubate- - IS per RT protocol - Titrate O2 for sat of 88-92% - Ambulate  Hypotension -Related to meds/intubation - Neo for BP support, titrate for MAP of 65 mmHg - D/C A-line - D/C benazepril  Acute pulmonary edema - Low dose pressors now, will need diureses but will hold for now  Atrial fibrillation with rapid ventricular response Elevated troponin peak 1.5 - Added oral metoprolol per cardiology, p.o. amiodarone - Rate control may be the best strategy here  Peripheral vascular disease with thrombosed graft status post thrombectomy and multiple stents -Per vascular surgery - Continue Plavix and bivalirudin - Ancef empiric  -Acute kidney injury / rhabdomyolysis - NS at 50 ml/hr - Monitor CK - Hypokalemia and hypophosphatemia will be repleted  Acute encephalopathy - D/C propofol and precedex - D/C fentanyl drip - Ct Haldol and minimize Versed drip - Add low-dose Risperdal for sundowning  Diabetes type 2, uncontrolled hyperglycemia - Ct Lantus 15 units, - Moderate SSI  SUMMARY OF TODAY'S PLAN:  Extubated today Pressor support as ordered PCCM will follow  Best Practice / Goals  of Care / Disposition.   DVT PROPHYLAXIS: bival SUP: PPI NUTRITION: TFs MOBILITY:*Bedrest GOALS OF CARE: Full code FAMILY DISCUSSIONS: No family around DISPOSITION ICU admission  The patient is critically ill with  multiple organ systems failure and requires high complexity decision making for assessment and support, frequent evaluation and titration of therapies, application of advanced monitoring technologies and extensive interpretation of multiple databases.   Critical Care Time devoted to patient care services described in this note is  33  Minutes. This time reflects time of care of this signee Dr Koren BoundWesam Okechukwu Regnier. This critical care time does not reflect procedure time, or teaching time or supervisory time of PA/NP/Med student/Med Resident etc but could involve care discussion time.  Alyson ReedyWesam G. Aydn Ferrara, M.D. [facility]/Critical [facility] Medicine. Pager: [phone]. After hours pager: [phone].

## 2018-01-27 NOTE — Progress Notes (Addendum)
Vent end date/time as 01/26/2018 at 2205 due to pt self extubating. Pt was then placed on NIV thru ventilator thus new vent start date/time.

## 2018-01-28 ENCOUNTER — Encounter (HOSPITAL_COMMUNITY): Payer: Self-pay | Admitting: Surgery

## 2018-01-28 DIAGNOSIS — E8770 Fluid overload, unspecified: Secondary | ICD-10-CM

## 2018-01-28 DIAGNOSIS — E876 Hypokalemia: Secondary | ICD-10-CM

## 2018-01-28 LAB — GLUCOSE, CAPILLARY
Glucose-Capillary: 135 mg/dL — ABNORMAL HIGH (ref 70–99)
Glucose-Capillary: 147 mg/dL — ABNORMAL HIGH (ref 70–99)
Glucose-Capillary: 158 mg/dL — ABNORMAL HIGH (ref 70–99)
Glucose-Capillary: 167 mg/dL — ABNORMAL HIGH (ref 70–99)
Glucose-Capillary: 177 mg/dL — ABNORMAL HIGH (ref 70–99)
Glucose-Capillary: 195 mg/dL — ABNORMAL HIGH (ref 70–99)
Glucose-Capillary: 196 mg/dL — ABNORMAL HIGH (ref 70–99)

## 2018-01-28 LAB — BASIC METABOLIC PANEL
Anion gap: 7 (ref 5–15)
BUN: 8 mg/dL (ref 6–20)
CO2: 25 mmol/L (ref 22–32)
Calcium: 8.1 mg/dL — ABNORMAL LOW (ref 8.9–10.3)
Chloride: 109 mmol/L (ref 98–111)
Creatinine, Ser: 0.98 mg/dL (ref 0.61–1.24)
GFR calc Af Amer: >60 mL/min (ref >60)
GFR calc non Af Amer: >60 mL/min (ref >60)
Glucose, Bld: 184 mg/dL — ABNORMAL HIGH (ref 70–99)
Potassium: 3.7 mmol/L (ref 3.5–5.1)
Sodium: 141 mmol/L (ref 135–145)

## 2018-01-28 LAB — CBC
HCT: 27 % — ABNORMAL LOW (ref 39.0–52.0)
Hemoglobin: 8.2 g/dL — ABNORMAL LOW (ref 13.0–17.0)
MCH: 29 pg (ref 26.0–34.0)
MCHC: 30.4 g/dL (ref 30.0–36.0)
MCV: 95.4 fL (ref 80.0–100.0)
Platelets: 107 10*3/uL — ABNORMAL LOW (ref 150–400)
RBC: 2.83 MIL/uL — ABNORMAL LOW (ref 4.22–5.81)
RDW: 14.2 % (ref 11.5–15.5)
WBC: 11.1 10*3/uL — ABNORMAL HIGH (ref 4.0–10.5)
nRBC: 0 % (ref 0.0–0.2)

## 2018-01-28 LAB — MAGNESIUM: Magnesium: 1.8 mg/dL (ref 1.7–2.4)

## 2018-01-28 LAB — PHOSPHORUS: Phosphorus: 2.8 mg/dL (ref 2.5–4.6)

## 2018-01-28 LAB — APTT: aPTT: 65 seconds — ABNORMAL HIGH (ref 24–36)

## 2018-01-28 MED ORDER — POTASSIUM CHLORIDE CRYS ER 20 MEQ PO TBCR: 40.0000 meq | EXTENDED_RELEASE_TABLET | Freq: Once | ORAL | Status: DC

## 2018-01-28 MED ORDER — FUROSEMIDE 10 MG/ML IJ SOLN
40.0000 mg | Freq: Four times a day (QID) | INTRAMUSCULAR | Status: AC
  Administered 2018-01-28 (×3): 40 mg via INTRAVENOUS
  Filled 2018-01-28 (×2): qty 4

## 2018-01-28 MED ORDER — CEFAZOLIN SODIUM-DEXTROSE 2-4 GM/100ML-% IV SOLN
2.0000 g | INTRAVENOUS | Status: AC
  Administered 2018-01-29: 2 g via INTRAVENOUS
  Filled 2018-01-28: qty 100

## 2018-01-28 MED ORDER — FUROSEMIDE 10 MG/ML IJ SOLN: 40.0000 mg | Freq: Once | INTRAMUSCULAR | Status: DC

## 2018-01-28 MED ORDER — POTASSIUM CHLORIDE CRYS ER 20 MEQ PO TBCR
40.0000 meq | EXTENDED_RELEASE_TABLET | Freq: Three times a day (TID) | ORAL | Status: AC
  Administered 2018-01-28 (×2): 40 meq via ORAL
  Filled 2018-01-28 (×2): qty 2

## 2018-01-28 NOTE — Progress Notes (Signed)
ANTICOAGULATION CONSULT NOTE  Pharmacy Consult for Bivalirudin Indication: atrial fibrillation and thrombectomy  Allergies  Allergen Reactions  . Ciprofloxacin Rash    Patient Measurements: Height: 6\' 1"  (185.4 cm) Weight: 186 lb 4.6 oz (84.5 kg) IBW/kg (Calculated) : 79.9 Heparin Dosing Weight: 78.8 kg  Vital Signs: Temp: 98.2 F (36.8 C) (12/10 0816) Temp Source: Oral (12/10 0816) BP: 144/84 (12/10 0800) Pulse Rate: 72 (12/10 0800)  Labs: Recent Labs    01/25/18 1000  01/25/18 1611 01/26/18 0114  01/26/18 0522 01/27/18 0349 01/28/18 0431  HGB  --   --   --   --    < > 8.6* 9.2* 8.2*  HCT  --   --   --   --   --  26.6* 29.4* 27.0*  PLT  --   --   --   --   --  89* 91* 107*  APTT  --    < > 56*  --   --  57* 58* 65*  CREATININE  --   --   --   --   --  0.99 0.97 0.98  CKTOTAL  --   --   --   --   --  9,063* 6,073*  --   TROPONINI 1.57*  --  1.49* 1.15*  --   --   --   --    < > = values in this interval not displayed.    Estimated Creatinine Clearance: 96.3 mL/min (by C-G formula based on SCr of 0.98 mg/dL).   Medical History: Past Medical History:  Diagnosis Date  . Carotid artery disease (HCC)    a. 60-70% RICA, totally occluded LICA, > 50% stenosed bilat ECA 04/2017.  Marland Kitchen. Chronic atrial fibrillation   . Coronary artery disease    a. remote cath in 2009 showing 30% mRCA, 30% PDA, 45% LAD, 30% diag.  . Diabetes mellitus   . Dyslipidemia   . History of kidney stones   . Hypertension   . Tobacco abuse     Medications:  Infusions:  . sodium chloride    . sodium chloride 50 mL/hr at 01/28/18 0800  . sodium chloride    . amiodarone 30 mg/hr (01/28/18 0800)  . bivalirudin (ANGIOMAX) infusion 0.5 mg/mL (Non-ACS indications) 0.15 mg/kg/hr (01/28/18 0800)  . [START ON 01/29/2018]  ceFAZolin (ANCEF) IV    . magnesium sulfate 1 - 4 g bolus IVPB    . norepinephrine (LEVOPHED) Adult infusion    . phenylephrine (NEO-SYNEPHRINE) Adult infusion Stopped (01/27/18  1148)  . sodium chloride      Assessment: 57 yo male previously on heparin and TPA for thrombolysis of L leg critical ischemia.  Platelet count remains low, but stable.  HIT unlikely, but pharmacy asked to switch heparin to bivalirudin just in case.  Also asked to increase to full dose anticoagulation 12/7.  HIT antibody this AM negative.  This morning's aPTT on bivalirudin = 65, at goal.  No overt bleeding or complications noted.  Goal of Therapy:  APTT 50-85 sec Monitor platelets by anticoagulation protocol: Yes   Plan:  Continue bivalirudin 0.15 mg/kg/hr Daily aPTT, CBC. F/u plans for oral anticoagulation as able.  Will the plan be for Eliquis + ASA + Plavix?  Or could ASA be stopped?  Jenetta DownerJessica Mahlik Lenn, Pharm D, BCPS, [facility] Clinical Pharmacist Phone [phone]  01/28/2018 9:56 AM

## 2018-01-28 NOTE — Progress Notes (Addendum)
    Subjective  - extubated doing well.  Resting comfortably   Physical Exam:  vacs in place Triphasic PT signal on left Able to move his toes this am Sensation intact to left foot       Assessment/Plan:    Appreciate cardiology assistance with AFIB, now rate controlled and on heparin Plan for attempt at fasciotomy closure tomorrow in OR   Wells Brabham 01/28/2018 8:35 AM --  Vitals:   01/28/18 0801 01/28/18 0816  BP:    Pulse:    Resp:    Temp:  98.2 F (36.8 C)  SpO2: 100%     Intake/Output Summary (Last 24 hours) at 01/28/2018 0835 Last data filed at 01/28/2018 0820 Gross per 24 hour  Intake 2238.99 ml  Output 1621 ml  Net 617.99 ml     Laboratory CBC    Component Value Date/Time   WBC 11.1 (H) 01/28/2018 0431   HGB 8.2 (L) 01/28/2018 0431   HCT 27.0 (L) 01/28/2018 0431   PLT 107 (L) 01/28/2018 0431    BMET    Component Value Date/Time   NA 141 01/28/2018 0431   K 3.7 01/28/2018 0431   CL 109 01/28/2018 0431   CO2 25 01/28/2018 0431   GLUCOSE 184 (H) 01/28/2018 0431   BUN 8 01/28/2018 0431   CREATININE 0.98 01/28/2018 0431   CALCIUM 8.1 (L) 01/28/2018 0431   GFRNONAA >60 01/28/2018 0431   GFRAA >60 01/28/2018 0431    COAG Lab Results  Component Value Date   INR 1.24 01/23/2018   INR 1.13 09/17/2017   INR 1.11 05/09/2017   No results found for: PTT  Antibiotics Anti-infectives (From admission, onward)   Start     Dose/Rate Route Frequency Ordered Stop   01/29/18 0600  ceFAZolin (ANCEF) IVPB 2g/100 mL premix     2 g 200 mL/hr over 30 Minutes Intravenous To Short Stay 01/28/18 0751 01/30/18 0600   01/25/18 0930  cephALEXin (KEFLEX) capsule 500 mg     500 mg Oral Every 8 hours 01/25/18 0919     01/24/18 2130  ceFAZolin (ANCEF) IVPB 2g/100 mL premix     2 g 200 mL/hr over 30 Minutes Intravenous Every 8 hours 01/24/18 2000 01/25/18 0657   01/24/18 1500  ceFAZolin (ANCEF) IVPB 2g/100 mL premix     2 g 200 mL/hr over 30  Minutes Intravenous  Once 01/24/18 1453 01/24/18 1532   01/24/18 1454  ceFAZolin (ANCEF) 2-4 GM/100ML-% IVPB    Note to Pharmacy:  Hogue, Samantha   : cabinet override      01/24/18 1454 01/24/18 1532       V. Wells Brabham IV, M.D. [facility] Office: [phone] Pager:  [phone] 

## 2018-01-28 NOTE — Progress Notes (Signed)
Progress Note  Patient Name: Willie Morales Date of Encounter: 01/28/2018  Primary Cardiologist: Kathlyn Sacramento, MD   Subjective   No chest pain or dyspnea  Inpatient Medications    Scheduled Meds: . amiodarone  150 mg Intravenous Once  . aspirin  81 mg Oral Daily  . atorvastatin  80 mg Oral q1800  . cephALEXin  500 mg Oral Q8H  . chlorhexidine gluconate (MEDLINE KIT)  15 mL Mouth Rinse BID  . Chlorhexidine Gluconate Cloth  6 each Topical Daily  . clopidogrel  75 mg Oral Daily  . docusate sodium  100 mg Oral Daily  . feeding supplement (ENSURE ENLIVE)  237 mL Oral BID BM  . gabapentin  300 mg Oral TID  . insulin aspart  0-15 Units Subcutaneous Q4H  . insulin glargine  15 Units Subcutaneous Daily  . ipratropium-albuterol  3 mL Nebulization Q4H  . levothyroxine  25 mcg Oral QAC breakfast  . mouth rinse  15 mL Mouth Rinse 10 times per day  . metoprolol tartrate  25 mg Oral BID  . multivitamin with minerals  1 tablet Oral Daily  . pantoprazole  40 mg Oral Daily  . risperiDONE  1 mg Oral Daily  . sodium chloride flush  10-40 mL Intracatheter Q12H   Continuous Infusions: . sodium chloride    . sodium chloride 50 mL/hr at 01/28/18 0800  . sodium chloride    . amiodarone 30 mg/hr (01/28/18 0800)  . bivalirudin (ANGIOMAX) infusion 0.5 mg/mL (Non-ACS indications) 0.15 mg/kg/hr (01/28/18 0800)  . [START ON 01/29/2018]  ceFAZolin (ANCEF) IV    . magnesium sulfate 1 - 4 g bolus IVPB    . norepinephrine (LEVOPHED) Adult infusion    . phenylephrine (NEO-SYNEPHRINE) Adult infusion Stopped (01/27/18 1148)  . sodium chloride     PRN Meds: sodium chloride, sodium chloride, alum & mag hydroxide-simeth, fentaNYL (SUBLIMAZE) injection, guaiFENesin-dextromethorphan, haloperidol lactate, hydrALAZINE, labetalol, magnesium sulfate 1 - 4 g bolus IVPB, metoCLOPramide (REGLAN) injection, metoprolol tartrate, morphine injection, ondansetron (ZOFRAN) IV, phenol, sodium chloride flush, sodium  chloride flush   Vital Signs    Vitals:   01/28/18 0700 01/28/18 0800 01/28/18 0801 01/28/18 0816  BP: (!) 165/85 (!) 144/84    Pulse: 78 72    Resp: 17 17    Temp:    98.2 F (36.8 C)  TempSrc:    Oral  SpO2: 100% 100% 100%   Weight:      Height:        Intake/Output Summary (Last 24 hours) at 01/28/2018 0823 Last data filed at 01/28/2018 0800 Gross per 24 hour  Intake 2238.99 ml  Output 1620 ml  Net 618.99 ml   Filed Weights   01/23/18 1738 01/26/18 0402 01/27/18 0400  Weight: 78.8 kg 84.5 kg 84.5 kg    Telemetry    Atrial fib, rates 90-110 - Personally Reviewed  ECG    No AM EKG   Physical Exam    General: Well developed, well nourished, NAD  HEENT: OP clear, mucus membranes moist  SKIN: warm, dry. No rashes. Neuro: No focal deficits  Musculoskeletal: Muscle strength 5/5 all ext  Psychiatric: Mood and affect normal  Neck: No JVD, no carotid bruits, no thyromegaly, no lymphadenopathy.  Lungs:Clear bilaterally, no wheezes, rhonci, crackles Cardiovascular: Irreg irreg. No murmurs, gallops or rubs. Abdomen:Soft. Bowel sounds present. Non-tender.  Extremities: wound vac left leg, no LE edema    Labs    Chemistry Recent Labs  Lab 01/23/18 [phone]  01/25/18 0350 01/26/18 0522 01/27/18 0349 01/28/18 0431  NA 136   < > 140 141 140 141  K 4.0   < > 3.6 3.1* 3.5 3.7  CL 103   < > 108 112* 110 109  CO2 22   < > 16* 20* 18* 25  GLUCOSE 233*   < > 283* 179* 256* 184*  BUN 17   < > '13 13 11 8  ' CREATININE 1.05   < > 1.29* 0.99 0.97 0.98  CALCIUM 9.8   < > 8.8* 8.1* 7.8* 8.1*  PROT 8.3*  --  5.8*  --   --   --   ALBUMIN 4.7  --  3.8  --   --   --   AST 24  --  218*  --   --   --   ALT 20  --  60*  --   --   --   ALKPHOS 73  --  37*  --   --   --   BILITOT 1.3*  --  1.9*  --   --   --   GFRNONAA >60   < > >60 >60 >60 >60  GFRAA >60   < > >60 >60 >60 >60  ANIONGAP 11   < > 16* '9 12 7   ' < > = values in this interval not displayed.     Hematology Recent  Labs  Lab 01/26/18 0522 01/27/18 0349 01/28/18 0431  WBC 14.4* 11.7* 11.1*  RBC 2.93* 3.14* 2.83*  HGB 8.6* 9.2* 8.2*  HCT 26.6* 29.4* 27.0*  MCV 90.8 93.6 95.4  MCH 29.4 29.3 29.0  MCHC 32.3 31.3 30.4  RDW 14.0 14.2 14.2  PLT 89* 91* 107*    Cardiac Enzymes Recent Labs  Lab 01/25/18 1000 01/25/18 1611 01/26/18 0114  TROPONINI 1.57* 1.49* 1.15*   No results for input(s): TROPIPOC in the last 168 hours.   BNPNo results for input(s): BNP, PROBNP in the last 168 hours.   DDimer No results for input(s): DDIMER in the last 168 hours.   Radiology    Dg Chest Port 1 View  Result Date: 01/27/2018 CLINICAL DATA:  Short of breath.  Acute respiratory failure. EXAM: PORTABLE CHEST 1 VIEW COMPARISON:  01/26/2018 FINDINGS: Endotracheal and NG tubes removed. Right PICC is stable. Vascular congestion and pulmonary edema are stable. No pneumothorax or pleural effusion. IMPRESSION: Extubated. Stable pulmonary edema. Electronically Signed   By: Marybelle Killings M.D.   On: 01/27/2018 08:44   Dg Chest Port 1 View  Result Date: 01/26/2018 CLINICAL DATA:  PICC line placement EXAM: PORTABLE CHEST 1 VIEW COMPARISON:  01/26/2018 at 0444 hours. FINDINGS: 1320 hours. Endotracheal tube terminates 4.9 cm above carina. Nasogastric tube extends beyond the inferior aspect of the film. Right-sided PICC line terminates at the superior caval/atrial junction. Borderline cardiomegaly. Right costophrenic angle excluded. No pneumothorax. Moderate interstitial edema is similar. Persistent bibasilar airspace disease. IMPRESSION: Right-sided PICC line terminating at the superior caval/atrial junction. Reposition of endotracheal tube, appropriate. Similar congestive heart failure and bibasilar atelectasis. Electronically Signed   By: Abigail Miyamoto M.D.   On: 01/26/2018 13:54   [place] Ekg Site Rite  Result Date: 01/26/2018 If Site Rite image not attached, placement could not be confirmed due to current cardiac  rhythm.   Cardiac Studies   Echo 01/26/18: Left ventricle: The cavity size was normal. Wall thickness was   increased in a pattern of mild LVH. Systolic function was normal.   The  estimated ejection fraction was in the range of 55% to 60%.   Wall motion was normal; there were no regional wall motion   abnormalities. The study was not technically sufficient to allow   evaluation of LV diastolic dysfunction due to atrial   fibrillation. - Mitral valve: There was moderate regurgitation. - Left atrium: The atrium was severely dilated. - Right ventricle: Systolic function was mildly reduced. - Atrial septum: No defect or patent foramen ovale was identified. - Tricuspid valve: There was mild regurgitation. - Systemic veins: Unable to accurately assess IVC as patient is on   ventilator.  Patient Profile     57 y.o. male with chronic atrial fibrillation, non-obstructive CAD, diabetes, hypertension, hyperlipidemia, and PAD status post recent peripheral bypass complicated by re-obstruction.  He is currently hospitalized with critical limb ischemia complicated by atrial fibrillation with rapid ventricular response.  Assessment & Plan    1. Atrial fibrillation with rapid ventricular response: Rate controlled this am. Continue IV amiodarone and po metoprolol for rate control. Continue bivalirudin with concern for HIT.    2. Elevated troponin: He is known to have CAD. Mild troponin elevation on admission. Echo 01/26/18 with normal wall motion, normal LV systolic function, CBIP=77-93%. Will plan ischemic evaluation when his acute issues are stable.   3. PAD/Critical Limb ischemia: Mr. Peddie had thrombosis of the below the knee popliteal bypass and underwent cutdown with graft thrombectomy and patch angioplasty 08/2017.  Went to the OR 12/6 for mechanical thrombectomy of the L femoropopliteal bypass with balloon angioplasty and stent placement in the L posterior tibial, L peroneal, L tibiopernoneal  trunk, L common iliac and L exernal iliac artereis.  He had a compartment fasciotomy due to high risk for compartment syndrome.VVS following.     For questions or updates, please contact [place] Please consult [email or web address] for contact info under        Signed, Lauree Chandler, MD  01/28/2018, 8:23 AM

## 2018-01-28 NOTE — Progress Notes (Addendum)
PULMONARY / CRITICAL CARE MEDICINE   NAME:  Willie Morales, MRN:  [id number], DOB:  01-01-1961, LOS: 5 ADMISSION DATE:  01/23/2018, CONSULTATION DATE:  01/24/2018 REFERRING MD:  Dr Myra Gianotti , CHIEF COMPLAINT:  resp failure   BRIEF HISTORY:    57 year old male with history of peripheral vascular disease and popliteal bypass adm  with thrombosed graft status post thrombectomy and multiple stents.   SIGNIFICANT PAST MEDICAL HISTORY   -Tobacco abuse -Diabetes mellitus -Atrial fibrillation on Eliquis -Hypertension -Peripheral vascular disease status post below-knee popliteal bypass March 2019 -Thrombosed graft underwent below-knee popliteal cutdown and graft thrombectomy July 2019  SIGNIFICANT EVENTS:   01/24/2018 >> mechanical thrombectomy of the left femoropopliteal bypass graft plus balloon angioplasty with multiple stents to the left posterior tibial artery, left peroneal artery, left tibioperoneal trunk, left common iliac artery and left external iliac artery patient also underwent compartment fasciotomy due to high risk for compartment syndrome. -Intubation 01/24/2018 12/7 on low dose neo gtt , CXR c/w edema , Agitated overnight requiring fent/ versed gtt Hypotensive with propofol ,Started on amio for AF-RVR 12/8 self extubated 12/9  Fib w/ RVR; amiodarone titrated. High flow oxygen added for on-going hypoxia.  Neo-Synephrine discontinued.  Precedex stopped 12/10: Stable, still high flow oxygen.  Initiating diuresis  STUDIES:    CULTURES:    ANTIBIOTICS:    LINES/TUBES:  ETT 12/6 >>12/9  CONSULTANTS:  Cards vascular SUBJECTIVE:   Denies pain or discomfort Objective    CONSTITUTIONAL: Blood Pressure (Abnormal) 144/84 (BP Location: Left Arm)   Pulse 72   Temperature 98.2 F (36.8 C) (Oral)   Respiration 17   Height 6\' 1"  (1.854 m)   Weight 84.5 kg   Oxygen Saturation 100%   Body Mass Index 24.58 kg/m   I/O last 3 completed shifts: In: 3745.9 [I.V.:3685.9;  NG/GT:60] Out: 2837 [Urine:1217; Emesis/NG output:700; Drains:920]  FiO2 (%):  [60 %-80 %] 60 %  PHYSICAL EXAM: General: 57 year old white male currently resting in bed he is in no acute distress HEENT normocephalic atraumatic no jugular venous distention Pulmonary: Diminished bases no accessory use Cardiac: Ongoing atrial fibrillation, rate bumping up to 130 at times no murmur rub or gallop Abdomen: Soft nontender no organomegaly Extremities: Left lower extremity, cool, pulses dopplered.  Wound VAC in place. Neuro: Awake, cooperative, moves all extremities GU: Clear yellow  RESOLVED PROBLEM LIST  Hypotension due to medications; Neo-Synephrine discontinued 12/9 AKI secondary to rhabdo resolved 12/8 ASSESSMENT AND PLAN    Acute hypoxic respiratory failure due to pulmonary edema, wonder about underlying obstructive airway disease -Self extubated 12/8, continues to require high flow oxygen -Portable chest x-ray reviewed from 12/9: Bilateral pulmonary edema, no significant change when comparing prior films Plan KVO IV fluids Wean high flow oxygen Lasix today (he is 13 L positive) Incentive spirometry A.m. chest x-ray  Atrial fibrillation with rapid ventricular response Elevated troponin peak 1.5 Plan Continuing amiodarone per cardiology Continue bivalirudin given concern for HIT Ischemia evaluation plan for 1 more stable Continue telemetry monitoring  Peripheral vascular disease with thrombosed graft status post thrombectomy and multiple stents Plan For a wound VAC changed 12/11 Management per vascular surgery  Acute encephalopathy: Improving Plan Continue risperidone at at bedtime  Intermittent  fluid and electrolyte imbalance Plan Trend chemistries  Diabetes type 2, uncontrolled hyperglycemia: Glucose is currently within goal Plan Continue current insulin regimen  Best Practice / Goals of Care / Disposition.   DVT PROPHYLAXIS: bival SUP: PPI NUTRITION: P.o.  intake MOBILITY:*Bedrest GOALS OF CARE:  Full code FAMILY DISCUSSIONS: No family around DISPOSITION remains critically ill due to need for high flow oxygen.  We will keep in the intensive care for titration of high FiO2 needs and diuresis.  Plan to go back to the OR likely 12/11  Simonne MartinetPeter E Babcock ACNP-BC [facility]Critical [facility] Pager # [phone] OR # [phone] if no answer  Attending Note:  57 year old male with PMH above who presented with a thrombosed graft s/p thrombectomy and stents placement.  Patient was left intubated and was extubated on 12/9.  Overnight, acute hypoxemic respiratory failure requiring HFNC.  On exam this AM, crackles noted.  I reviewed CXR myself, pulmonary edema noted.  Patient now has stable SBP.  Hold in the ICU given hypoxemic respiratory failure.  Lasix 40 mg IV q6 x3 doses.  K-dur  As ordered.  BMET in AM.  Titrate O2 for sat of 88-92%.  Strict I/O.  Replace electrolytes as indicated.  PCCM will continue to follow.  The patient is critically ill with multiple organ systems failure and requires high complexity decision making for assessment and support, frequent evaluation and titration of therapies, application of advanced monitoring technologies and extensive interpretation of multiple databases.   Critical Care Time devoted to patient care services described in this note is  32  Minutes. This time reflects time of care of this signee Dr Koren BoundWesam Kenna Seward. This critical care time does not reflect procedure time, or teaching time or supervisory time of PA/NP/Med student/Med Resident etc but could involve care discussion time.  Alyson ReedyWesam G. Emery Binz, M.D. [facility]. Pager: [phone]. After hours pager: [phone].

## 2018-01-28 NOTE — Evaluation (Signed)
Physical Therapy Evaluation Patient Details Name: Willie Morales MRN: [id number] DOB: 1961-02-17 Today's Date: 01/28/2018   History of Present Illness  57 year old male with history of peripheral vascular disease and popliteal bypass admitted with thrombosed graft status post thrombectomy and multiple stents under went mechanical thrombectomy of the left femoropopliteal bypass graft plus balloon angioplasty with multiple stents. Patient also underwent compartment fasciotomy due to high risk for compartment syndrome.  Clinical Impression  Orders received for PT evaluation. Patient demonstrates deficits in functional mobility as indicated below. Will benefit from continued skilled PT to address deficits and maximize function. Will see as indicated and progress as tolerated.  At this time, anticipate patient may need post acute rehabilitation as patient has no assist at home. Recommending SNF.    Follow Up Recommendations SNF(pending progress)    Equipment Recommendations  Rolling walker with 5" wheels    Recommendations for Other Services       Precautions / Restrictions Precautions Precautions: Fall Precaution Comments: wound vac Restrictions Weight Bearing Restrictions: No      Mobility  Bed Mobility Overal bed mobility: Needs Assistance Bed Mobility: Supine to Sit     Supine to sit: Min assist;+2 for safety/equipment     General bed mobility comments: min assist to support left LE during movement to EOB, increased time and effort. Assist to elevate LLE back to bed  Transfers Overall transfer level: Needs assistance   Transfers: Sit to/from Stand Sit to Stand: Mod assist         General transfer comment: moderate assist to clear buttocks and reposition sliding up in bed. Increased time and effort, unable to reach full upright position and limited to single RLE weight bearing  Ambulation/Gait             General Gait Details: did not attempt  Stairs             Wheelchair Mobility    Modified Rankin (Stroke Patients Only)       Balance Overall balance assessment: Needs assistance Sitting-balance support: Bilateral upper extremity supported Sitting balance-Leahy Scale: Fair Sitting balance - Comments: tolerated EOB for 3 minutes                                     Pertinent Vitals/Pain Pain Assessment: Faces Faces Pain Scale: Hurts even more Pain Location: left LE with movement Pain Descriptors / Indicators: Throbbing;Tender;Sore;Grimacing;Operative site guarding Pain Intervention(s): Monitored during session    Home Living Family/patient expects to be discharged to:: Private residence Living Arrangements: Alone Available Help at Discharge: Friend(s);Available PRN/intermittently Type of Home: House Home Access: Stairs to enter Entrance Stairs-Rails: Can reach both Entrance Stairs-Number of Steps: 2 Home Layout: One level Home Equipment: Walker - 2 wheels Additional Comments: Pt with a recent seperation from his wife. Reports that friends will check on him upon dc.    Prior Function Level of Independence: Independent         Comments: ADLs, IADLs, and driving. Pt enjoys taking care of his (140lb) [place] "Buster".     Hand Dominance   Dominant Hand: Right    Extremity/Trunk Assessment   Upper Extremity Assessment Upper Extremity Assessment: Overall WFL for tasks assessed    Lower Extremity Assessment Lower Extremity Assessment: LLE deficits/detail LLE Deficits / Details: some active movement noted in big toe and 1st and 2nd digit. Sensation diminished but exists. Active movement in hip/knee LLE:  Unable to fully assess due to pain LLE Sensation: decreased light touch LLE Coordination: decreased fine motor;decreased gross motor       Communication   Communication: No difficulties  Cognition Arousal/Alertness: Awake/alert Behavior During Therapy: WFL for tasks  assessed/performed Overall Cognitive Status: No family/caregiver present to determine baseline cognitive functioning                                        General Comments      Exercises     Assessment/Plan    PT Assessment Patient needs continued PT services  PT Problem List Decreased strength;Decreased activity tolerance;Decreased balance;Decreased mobility;Decreased coordination;Decreased safety awareness;Pain;Decreased skin integrity;Impaired sensation       PT Treatment Interventions DME instruction;Gait training;Functional mobility training;Therapeutic activities;Balance training;Stair training;Therapeutic exercise;Neuromuscular re-education;Cognitive remediation;Patient/family education    PT Goals (Current goals can be found in the Care Plan section)  Acute Rehab PT Goals Patient Stated Goal: to go home and be indpendent PT Goal Formulation: With patient Time For Goal Achievement: 02/11/18 Potential to Achieve Goals: Good    Frequency Min 2X/week   Barriers to discharge Decreased caregiver support      Co-evaluation               AM-PAC PT "6 Clicks" Mobility  Outcome Measure Help needed turning from your back to your side while in a flat bed without using bedrails?: A Little Help needed moving from lying on your back to sitting on the side of a flat bed without using bedrails?: A Little Help needed moving to and from a bed to a chair (including a wheelchair)?: A Little Help needed standing up from a chair using your arms (e.g., wheelchair or bedside chair)?: A Lot Help needed to walk in hospital room?: A Lot Help needed climbing 3-5 steps with a railing? : A Lot 6 Click Score: 15    End of Session Equipment Utilized During Treatment: Oxygen Activity Tolerance: Patient limited by pain Patient left: in bed;with call bell/phone within reach Nurse Communication: Mobility status PT Visit Diagnosis: Difficulty in walking, not elsewhere  classified (R26.2);Pain Pain - Right/Left: Left Pain - part of body: Leg    Time: [id number] PT Time Calculation (min) (ACUTE ONLY): 20 min   Charges:   PT Evaluation $PT Eval Moderate Complexity: 1 Mod          Charlotte Crumbevon Eisley Barber, PT DPT  Board Certified Neurologic Specialist Acute Rehabilitation Services Pager [phone] Office [phone]   Fabio AsaDevon J Teigan Manner 01/28/2018, 10:42 AM

## 2018-01-28 NOTE — H&P (View-Only) (Signed)
    Subjective  - extubated doing well.  Resting comfortably   Physical Exam:  vacs in place Triphasic PT signal on left Able to move his toes this am Sensation intact to left foot       Assessment/Plan:    Appreciate cardiology assistance with AFIB, now rate controlled and on heparin Plan for attempt at fasciotomy closure tomorrow in OR   Wells Brabham 01/28/2018 8:35 AM --  Vitals:   01/28/18 0801 01/28/18 0816  BP:    Pulse:    Resp:    Temp:  98.2 F (36.8 C)  SpO2: 100%     Intake/Output Summary (Last 24 hours) at 01/28/2018 0835 Last data filed at 01/28/2018 0820 Gross per 24 hour  Intake 2238.99 ml  Output 1621 ml  Net 617.99 ml     Laboratory CBC    Component Value Date/Time   WBC 11.1 (H) 01/28/2018 0431   HGB 8.2 (L) 01/28/2018 0431   HCT 27.0 (L) 01/28/2018 0431   PLT 107 (L) 01/28/2018 0431    BMET    Component Value Date/Time   NA 141 01/28/2018 0431   K 3.7 01/28/2018 0431   CL 109 01/28/2018 0431   CO2 25 01/28/2018 0431   GLUCOSE 184 (H) 01/28/2018 0431   BUN 8 01/28/2018 0431   CREATININE 0.98 01/28/2018 0431   CALCIUM 8.1 (L) 01/28/2018 0431   GFRNONAA >60 01/28/2018 0431   GFRAA >60 01/28/2018 0431    COAG Lab Results  Component Value Date   INR 1.24 01/23/2018   INR 1.13 09/17/2017   INR 1.11 05/09/2017   No results found for: PTT  Antibiotics Anti-infectives (From admission, onward)   Start     Dose/Rate Route Frequency Ordered Stop   01/29/18 0600  ceFAZolin (ANCEF) IVPB 2g/100 mL premix     2 g 200 mL/hr over 30 Minutes Intravenous To Short Stay 01/28/18 0751 01/30/18 0600   01/25/18 0930  cephALEXin (KEFLEX) capsule 500 mg     500 mg Oral Every 8 hours 01/25/18 0919     01/24/18 2130  ceFAZolin (ANCEF) IVPB 2g/100 mL premix     2 g 200 mL/hr over 30 Minutes Intravenous Every 8 hours 01/24/18 2000 01/25/18 0657   01/24/18 1500  ceFAZolin (ANCEF) IVPB 2g/100 mL premix     2 g 200 mL/hr over 30  Minutes Intravenous  Once 01/24/18 1453 01/24/18 1532   01/24/18 1454  ceFAZolin (ANCEF) 2-4 GM/100ML-% IVPB    Note to Pharmacy:  Sabino NiemannHogue, Samantha   : cabinet override      01/24/18 1454 01/24/18 1532       V. Charlena CrossWells Brabham IV, M.D. [facility] [place] Office: [phone] Pager:  [phone]

## 2018-01-29 ENCOUNTER — Encounter (HOSPITAL_COMMUNITY)
Admission: EM | Disposition: A | Discharge status: Home Health | Payer: Self-pay | Source: Home / Self Care | Attending: Vascular Surgery

## 2018-01-29 ENCOUNTER — Inpatient Hospital Stay (HOSPITAL_COMMUNITY): Payer: BLUE CROSS/BLUE SHIELD | Admitting: Certified Registered"

## 2018-01-29 HISTORY — PX: FASCIOTOMY CLOSURE: SHX5829

## 2018-01-29 HISTORY — PX: APPLICATION OF WOUND VAC: SHX5189

## 2018-01-29 LAB — PROTIME-INR
INR: 1.72
Prothrombin Time: 19.9 seconds — ABNORMAL HIGH (ref 11.4–15.2)

## 2018-01-29 LAB — GLUCOSE, CAPILLARY
Glucose-Capillary: 135 mg/dL — ABNORMAL HIGH (ref 70–99)
Glucose-Capillary: 140 mg/dL — ABNORMAL HIGH (ref 70–99)
Glucose-Capillary: 157 mg/dL — ABNORMAL HIGH (ref 70–99)
Glucose-Capillary: 172 mg/dL — ABNORMAL HIGH (ref 70–99)
Glucose-Capillary: 173 mg/dL — ABNORMAL HIGH (ref 70–99)
Glucose-Capillary: 176 mg/dL — ABNORMAL HIGH (ref 70–99)
Glucose-Capillary: 94 mg/dL (ref 70–99)

## 2018-01-29 LAB — BASIC METABOLIC PANEL
Anion gap: 9 (ref 5–15)
BUN: <5 mg/dL — ABNORMAL LOW (ref 6–20)
CO2: 28 mmol/L (ref 22–32)
Calcium: 8.4 mg/dL — ABNORMAL LOW (ref 8.9–10.3)
Chloride: 100 mmol/L (ref 98–111)
Creatinine, Ser: 1 mg/dL (ref 0.61–1.24)
GFR calc Af Amer: >60 mL/min (ref >60)
GFR calc non Af Amer: >60 mL/min (ref >60)
Glucose, Bld: 208 mg/dL — ABNORMAL HIGH (ref 70–99)
Potassium: 3.3 mmol/L — ABNORMAL LOW (ref 3.5–5.1)
Sodium: 137 mmol/L (ref 135–145)

## 2018-01-29 LAB — CBC
HCT: 29.4 % — ABNORMAL LOW (ref 39.0–52.0)
Hemoglobin: 9.2 g/dL — ABNORMAL LOW (ref 13.0–17.0)
MCH: 29.1 pg (ref 26.0–34.0)
MCHC: 31.3 g/dL (ref 30.0–36.0)
MCV: 93 fL (ref 80.0–100.0)
Platelets: 143 10*3/uL — ABNORMAL LOW (ref 150–400)
RBC: 3.16 MIL/uL — ABNORMAL LOW (ref 4.22–5.81)
RDW: 13.6 % (ref 11.5–15.5)
WBC: 11.4 10*3/uL — ABNORMAL HIGH (ref 4.0–10.5)
nRBC: 0 % (ref 0.0–0.2)

## 2018-01-29 LAB — APTT: aPTT: 60 seconds — ABNORMAL HIGH (ref 24–36)

## 2018-01-29 LAB — MAGNESIUM: Magnesium: 1.6 mg/dL — ABNORMAL LOW (ref 1.7–2.4)

## 2018-01-29 LAB — PHOSPHORUS: Phosphorus: 3.2 mg/dL (ref 2.5–4.6)

## 2018-01-29 SURGERY — FASCIOTOMY CLOSURE
Anesthesia: General | Site: Leg Lower | Laterality: Left

## 2018-01-29 MED ORDER — IPRATROPIUM-ALBUTEROL 0.5-2.5 (3) MG/3ML IN SOLN: 3.0000 mL | Freq: Three times a day (TID) | RESPIRATORY_TRACT | Status: DC

## 2018-01-29 MED ORDER — PROPOFOL 10 MG/ML IV BOLUS
INTRAVENOUS | Status: AC
  Filled 2018-01-29: qty 40

## 2018-01-29 MED ORDER — IPRATROPIUM-ALBUTEROL 0.5-2.5 (3) MG/3ML IN SOLN
3.0000 mL | RESPIRATORY_TRACT | Status: DC
  Administered 2018-01-29 (×4): 3 mL via RESPIRATORY_TRACT
  Filled 2018-01-29 (×4): qty 3

## 2018-01-29 MED ORDER — GLYCOPYRROLATE PF 0.2 MG/ML IJ SOSY
PREFILLED_SYRINGE | INTRAMUSCULAR | Status: AC
  Filled 2018-01-29: qty 2

## 2018-01-29 MED ORDER — IPRATROPIUM-ALBUTEROL 0.5-2.5 (3) MG/3ML IN SOLN
3.0000 mL | Freq: Three times a day (TID) | RESPIRATORY_TRACT | Status: DC
  Administered 2018-01-30 – 2018-01-31 (×4): 3 mL via RESPIRATORY_TRACT
  Filled 2018-01-29 (×6): qty 3

## 2018-01-29 MED ORDER — MAGNESIUM SULFATE 2 GM/50ML IV SOLN
2.0000 g | Freq: Once | INTRAVENOUS | Status: AC
  Administered 2018-01-29: 2 g via INTRAVENOUS
  Filled 2018-01-29: qty 50

## 2018-01-29 MED ORDER — SODIUM CHLORIDE 0.9 % IV SOLN
INTRAVENOUS | Status: DC | PRN
  Administered 2018-01-29: 30 ug/min via INTRAVENOUS

## 2018-01-29 MED ORDER — ONDANSETRON HCL 4 MG/2ML IJ SOLN
INTRAMUSCULAR | Status: AC
  Filled 2018-01-29: qty 2

## 2018-01-29 MED ORDER — SUCCINYLCHOLINE CHLORIDE 200 MG/10ML IV SOSY
PREFILLED_SYRINGE | INTRAVENOUS | Status: AC
  Filled 2018-01-29: qty 20

## 2018-01-29 MED ORDER — LIDOCAINE 2% (20 MG/ML) 5 ML SYRINGE
INTRAMUSCULAR | Status: DC | PRN
  Administered 2018-01-29: 60 mg via INTRAVENOUS

## 2018-01-29 MED ORDER — ETOMIDATE 2 MG/ML IV SOLN
INTRAVENOUS | Status: AC
  Filled 2018-01-29: qty 10

## 2018-01-29 MED ORDER — FENTANYL CITRATE (PF) 100 MCG/2ML IJ SOLN
INTRAMUSCULAR | Status: DC | PRN
  Administered 2018-01-29: 25 ug via INTRAVENOUS
  Administered 2018-01-29: 50 ug via INTRAVENOUS

## 2018-01-29 MED ORDER — LIDOCAINE 2% (20 MG/ML) 5 ML SYRINGE
INTRAMUSCULAR | Status: AC
  Filled 2018-01-29: qty 15

## 2018-01-29 MED ORDER — MIDAZOLAM HCL 2 MG/2ML IJ SOLN
INTRAMUSCULAR | Status: AC
  Filled 2018-01-29: qty 2

## 2018-01-29 MED ORDER — PHENYLEPHRINE 40 MCG/ML (10ML) SYRINGE FOR IV PUSH (FOR BLOOD PRESSURE SUPPORT)
PREFILLED_SYRINGE | INTRAVENOUS | Status: AC
  Filled 2018-01-29: qty 20

## 2018-01-29 MED ORDER — EPHEDRINE SULFATE-NACL 50-0.9 MG/10ML-% IV SOSY
PREFILLED_SYRINGE | INTRAVENOUS | Status: DC | PRN
  Administered 2018-01-29 (×3): 5 mg via INTRAVENOUS

## 2018-01-29 MED ORDER — ETOMIDATE 2 MG/ML IV SOLN
INTRAVENOUS | Status: DC | PRN
  Administered 2018-01-29: 10 mg via INTRAVENOUS

## 2018-01-29 MED ORDER — FENTANYL CITRATE (PF) 250 MCG/5ML IJ SOLN
INTRAMUSCULAR | Status: AC
  Filled 2018-01-29: qty 5

## 2018-01-29 MED ORDER — AMIODARONE HCL 200 MG PO TABS
200.0000 mg | ORAL_TABLET | Freq: Two times a day (BID) | ORAL | Status: DC
  Administered 2018-01-29 – 2018-01-31 (×4): 200 mg via ORAL
  Filled 2018-01-29 (×4): qty 1

## 2018-01-29 MED ORDER — EPHEDRINE 5 MG/ML INJ
INTRAVENOUS | Status: AC
  Filled 2018-01-29: qty 20

## 2018-01-29 MED ORDER — ONDANSETRON HCL 4 MG/2ML IJ SOLN: 4.0000 mg | Freq: Once | INTRAMUSCULAR | Status: DC | PRN

## 2018-01-29 MED ORDER — SUCCINYLCHOLINE CHLORIDE 20 MG/ML IJ SOLN
INTRAMUSCULAR | Status: DC | PRN
  Administered 2018-01-29: 80 mg via INTRAVENOUS

## 2018-01-29 MED ORDER — DEXAMETHASONE SODIUM PHOSPHATE 10 MG/ML IJ SOLN
INTRAMUSCULAR | Status: AC
  Filled 2018-01-29: qty 1

## 2018-01-29 MED ORDER — LACTATED RINGERS IV SOLN
INTRAVENOUS | Status: DC | PRN
  Administered 2018-01-29: 12:00:00 via INTRAVENOUS

## 2018-01-29 MED ORDER — 0.9 % SODIUM CHLORIDE (POUR BTL) OPTIME
TOPICAL | Status: DC | PRN
  Administered 2018-01-29: 1000 mL

## 2018-01-29 MED ORDER — ROCURONIUM BROMIDE 50 MG/5ML IV SOSY
PREFILLED_SYRINGE | INTRAVENOUS | Status: AC
  Filled 2018-01-29: qty 15

## 2018-01-29 MED ORDER — POTASSIUM CHLORIDE 10 MEQ/50ML IV SOLN
10.0000 meq | INTRAVENOUS | Status: AC
  Administered 2018-01-29 (×4): 10 meq via INTRAVENOUS
  Filled 2018-01-29 (×3): qty 50

## 2018-01-29 MED ORDER — ONDANSETRON HCL 4 MG/2ML IJ SOLN
INTRAMUSCULAR | Status: DC | PRN
  Administered 2018-01-29: 4 mg via INTRAVENOUS

## 2018-01-29 MED ORDER — FENTANYL CITRATE (PF) 100 MCG/2ML IJ SOLN: 25.0000 ug | INTRAMUSCULAR | Status: DC | PRN

## 2018-01-29 MED ORDER — PHENYLEPHRINE 40 MCG/ML (10ML) SYRINGE FOR IV PUSH (FOR BLOOD PRESSURE SUPPORT)
PREFILLED_SYRINGE | INTRAVENOUS | Status: DC | PRN
  Administered 2018-01-29: 120 ug via INTRAVENOUS

## 2018-01-29 MED ORDER — PROPOFOL 10 MG/ML IV BOLUS
INTRAVENOUS | Status: DC | PRN
  Administered 2018-01-29: 50 mg via INTRAVENOUS

## 2018-01-29 SURGICAL SUPPLY — 52 items
BAG ISOLATION DRAPE 18X18 (DRAPES) ×1 IMPLANT
BANDAGE ACE 4X5 VEL STRL LF (GAUZE/BANDAGES/DRESSINGS) IMPLANT
BANDAGE ACE 6X5 VEL STRL LF (GAUZE/BANDAGES/DRESSINGS) IMPLANT
BNDG GAUZE ELAST 4 BULKY (GAUZE/BANDAGES/DRESSINGS) IMPLANT
BRUSH SCRUB EZ PLAIN DRY (MISCELLANEOUS) ×3 IMPLANT
CANISTER SUCT 3000ML PPV (MISCELLANEOUS) ×3 IMPLANT
CLIP VESOCCLUDE MED 6/CT (CLIP) ×3 IMPLANT
CLIP VESOCCLUDE SM WIDE 6/CT (CLIP) ×3 IMPLANT
CONNECTOR Y ATS VAC SYSTEM (MISCELLANEOUS) ×3 IMPLANT
COVER SURGICAL LIGHT HANDLE (MISCELLANEOUS) ×3 IMPLANT
COVER WAND RF STERILE (DRAPES) ×3 IMPLANT
DRAPE EXTREMITY BILATERAL (DRAPES) IMPLANT
DRAPE HALF SHEET 40X57 (DRAPES) IMPLANT
DRAPE ISOLATION BAG 18X18 (DRAPES) ×2
DRAPE ORTHO SPLIT 77X108 STRL (DRAPES) ×4
DRAPE SURG ORHT 6 SPLT 77X108 (DRAPES) ×2 IMPLANT
DRAPE U-SHAPE 76X120 STRL (DRAPES) IMPLANT
DRSG VAC ATS LRG SENSATRAC (GAUZE/BANDAGES/DRESSINGS) ×3 IMPLANT
ELECT REM PT RETURN 9FT ADLT (ELECTROSURGICAL) ×3
ELECTRODE REM PT RTRN 9FT ADLT (ELECTROSURGICAL) ×1 IMPLANT
GAUZE SPONGE 4X4 12PLY STRL (GAUZE/BANDAGES/DRESSINGS) ×3 IMPLANT
GAUZE XEROFORM 5X9 LF (GAUZE/BANDAGES/DRESSINGS) IMPLANT
GLOVE BIOGEL PI IND STRL 6.5 (GLOVE) ×1 IMPLANT
GLOVE BIOGEL PI IND STRL 7.5 (GLOVE) ×2 IMPLANT
GLOVE BIOGEL PI IND STRL 8.5 (GLOVE) ×1 IMPLANT
GLOVE BIOGEL PI INDICATOR 6.5 (GLOVE) ×2
GLOVE BIOGEL PI INDICATOR 7.5 (GLOVE) ×4
GLOVE BIOGEL PI INDICATOR 8.5 (GLOVE) ×2
GLOVE ECLIPSE 6.5 STRL STRAW (GLOVE) ×3 IMPLANT
GLOVE SURG SS PI 7.5 STRL IVOR (GLOVE) ×3 IMPLANT
GOWN STRL REUS W/ TWL LRG LVL3 (GOWN DISPOSABLE) ×2 IMPLANT
GOWN STRL REUS W/ TWL XL LVL3 (GOWN DISPOSABLE) ×1 IMPLANT
GOWN STRL REUS W/TWL LRG LVL3 (GOWN DISPOSABLE) ×4
GOWN STRL REUS W/TWL XL LVL3 (GOWN DISPOSABLE) ×2
KIT BASIN OR (CUSTOM PROCEDURE TRAY) ×3 IMPLANT
KIT DRAINAGE VACCUM ASSIST (KITS) ×3 IMPLANT
KIT TURNOVER KIT B (KITS) ×3 IMPLANT
NS IRRIG 1000ML POUR BTL (IV SOLUTION) ×3 IMPLANT
PACK CV ACCESS (CUSTOM PROCEDURE TRAY) IMPLANT
PACK GENERAL/GYN (CUSTOM PROCEDURE TRAY) ×3 IMPLANT
PACK UNIVERSAL I (CUSTOM PROCEDURE TRAY) IMPLANT
PAD ARMBOARD 7.5X6 YLW CONV (MISCELLANEOUS) ×6 IMPLANT
STAPLER VISISTAT 35W (STAPLE) ×3 IMPLANT
SUT ETHILON 2 0 PSLX (SUTURE) ×15 IMPLANT
SUT ETHILON 3 0 PS 1 (SUTURE) IMPLANT
SUT VIC AB 2-0 CTX 36 (SUTURE) IMPLANT
SUT VIC AB 3-0 SH 27 (SUTURE)
SUT VIC AB 3-0 SH 27X BRD (SUTURE) IMPLANT
SUT VICRYL 4-0 PS2 18IN ABS (SUTURE) IMPLANT
TOWEL GREEN STERILE (TOWEL DISPOSABLE) ×3 IMPLANT
WATER STERILE IRR 1000ML POUR (IV SOLUTION) ×3 IMPLANT
WND VAC CANISTER 500ML (MISCELLANEOUS) ×3 IMPLANT

## 2018-01-29 NOTE — Anesthesia Preprocedure Evaluation (Addendum)
Anesthesia Evaluation  Patient identified by MRN, date of birth, ID band Patient awake    Reviewed: Allergy & Precautions, NPO status , Patient's Chart, lab work & pertinent test results, reviewed documented beta blocker date and time   History of Anesthesia Complications Negative for: history of anesthetic complications  Airway Mallampati: I  TM Distance: >3 FB Neck ROM: Full    Dental  (+) Edentulous Upper, Loose, Dental Advisory Given,    Pulmonary Current Smoker,    breath sounds clear to auscultation + decreased breath sounds      Cardiovascular hypertension, Pt. on home beta blockers and Pt. on medications + CAD and + Peripheral Vascular Disease (popliteal bypass adm  with thrombosed graft status post thrombectomy and multiple stents)  + dysrhythmias Atrial Fibrillation  Rhythm:Irregular Rate:Abnormal  Study Conclusions  - Left ventricle: The cavity size was normal. Wall thickness was   increased in a pattern of mild LVH. Systolic function was normal.   The estimated ejection fraction was in the range of 60% to 65%.   Wall motion was normal; there were no regional wall motion   abnormalities. The study was not technically sufficient to allow   evaluation of LV diastolic dysfunction due to atrial   fibrillation. - Aortic valve: Mildly calcified annulus. Trileaflet; mildly   thickened leaflets. - Mitral valve: Mildly thickened leaflets . There was mild   regurgitation. - Left atrium: The atrium was moderately dilated. - Right ventricle: Systolic function was mildly reduced. - Tricuspid valve: There was mild regurgitation.   Neuro/Psych negative neurological ROS  negative psych ROS   GI/Hepatic negative GI ROS, Neg liver ROS,   Endo/Other  diabetes, Type 2, Oral Hypoglycemic Agents  Renal/GU negative Renal ROS     Musculoskeletal negative musculoskeletal ROS (+)   Abdominal Normal abdominal exam  (+)   Peds  Hematology negative hematology ROS (+) Blood dyscrasia (Thrombocytopenia), anemia ,   Anesthesia Other Findings Day of surgery medications reviewed with the patient.  Reproductive/Obstetrics                            Lab Results  Component Value Date   WBC 11.4 (H) 01/29/2018   HGB 9.2 (L) 01/29/2018   HCT 29.4 (L) 01/29/2018   MCV 93.0 01/29/2018   PLT 143 (L) 01/29/2018   Lab Results  Component Value Date   CREATININE 1.00 01/29/2018   BUN <5 (L) 01/29/2018   NA 137 01/29/2018   K 3.3 (L) 01/29/2018   CL 100 01/29/2018   CO2 28 01/29/2018   Lab Results  Component Value Date   INR 1.72 01/29/2018   INR 1.24 01/23/2018   INR 1.13 09/17/2017   EKG: atrial fibrillation.   Anesthesia Physical  Anesthesia Plan  ASA: III  Anesthesia Plan: General   Post-op Pain Management:    Induction: Intravenous  PONV Risk Score and Plan: 2 and Ondansetron, Treatment may vary due to age or medical condition and Midazolam  Airway Management Planned: Oral ETT  Additional Equipment:   Intra-op Plan:   Post-operative Plan: Extubation in OR  Informed Consent: I have reviewed the patients History and Physical, chart, labs and discussed the procedure including the risks, benefits and alternatives for the proposed anesthesia with the patient or authorized representative who has indicated his/her understanding and acceptance.   Dental advisory given  Plan Discussed with: CRNA and Anesthesiologist  Anesthesia Plan Comments:  Anesthesia Quick Evaluation  

## 2018-01-29 NOTE — Transfer of Care (Signed)
Immediate Anesthesia Transfer of Care Note  Patient: Willie Morales  Procedure(s) Performed: FASCIOTOMY CLOSURE LEFT LOWER EXTREMITY (Left Leg Lower) APPLICATION OF WOUND VAC LEFT LOWER LEG MEDIAL AND LATERAL SIDES (Left Leg Lower)  Patient Location: PACU  Anesthesia Type:General  Level of Consciousness: awake, alert , oriented and patient cooperative  Airway & Oxygen Therapy: Patient Spontanous Breathing and Patient connected to face mask oxygen  Post-op Assessment: Report given to RN and Post -op Vital signs reviewed and stable  Post vital signs: Reviewed and stable  Last Vitals:  Vitals Value Taken Time  BP    Temp    Pulse    Resp    SpO2      Last Pain:  Vitals:   01/29/18 1033  TempSrc:   PainSc: 0-No pain      Patients Stated Pain Goal: 0 (01/29/18 1033)  Complications: No apparent anesthesia complications

## 2018-01-29 NOTE — Progress Notes (Addendum)
PULMONARY / CRITICAL CARE MEDICINE   NAME:  Willie Morales, MRN:  [id number], DOB:  02/21/1960, LOS: 6 ADMISSION DATE:  01/23/2018, CONSULTATION DATE:  01/24/2018 REFERRING MD:  Dr Myra GianottiBrabham , CHIEF COMPLAINT:  resp failure   BRIEF HISTORY:    57 year old male with history of peripheral vascular disease and popliteal bypass adm  with thrombosed graft status post thrombectomy and multiple stents.   SIGNIFICANT PAST MEDICAL HISTORY   -Tobacco abuse -Diabetes mellitus -Atrial fibrillation on Eliquis -Hypertension -Peripheral vascular disease status post below-knee popliteal bypass March 2019 -Thrombosed graft underwent below-knee popliteal cutdown and graft thrombectomy July 2019  SIGNIFICANT EVENTS:   01/24/2018 >> mechanical thrombectomy of the left femoropopliteal bypass graft plus balloon angioplasty with multiple stents to the left posterior tibial artery, left peroneal artery, left tibioperoneal trunk, left common iliac artery and left external iliac artery patient also underwent compartment fasciotomy due to high risk for compartment syndrome. -Intubation 01/24/2018 12/7 on low dose neo gtt , CXR c/w edema , Agitated overnight requiring fent/ versed gtt Hypotensive with propofol ,Started on amio for AF-RVR 12/8 self extubated 12/9  Fib w/ RVR; amiodarone titrated. High flow oxygen added for on-going hypoxia.  Neo-Synephrine discontinued.  Precedex stopped 12/10: Stable, still high flow oxygen.  Initiating diuresis 12/11: excellent response to diuresis. FIO2 down to 30% STUDIES:    CULTURES:    ANTIBIOTICS:    LINES/TUBES:  ETT 12/6 >>12/9  CONSULTANTS:  Cards vascular SUBJECTIVE:  No pain. Shortness of breath improved.   Objective    CONSTITUTIONAL: Blood Pressure 139/74 (BP Location: Left Arm)   Pulse 86   Temperature 98 F (36.7 C) (Oral)   Respiration (Abnormal) 8   Height 6\' 1"  (1.854 m)   Weight 84.5 kg   Oxygen Saturation 97%   Body Mass Index 24.58 kg/m    I/O last 3 completed shifts: In: 3266.2 [P.O.:960; I.V.:2306.2] Out: 7175 [Urine:6730; Drains:445]  FiO2 (%):  [35 %-60 %] 35 %  PHYSICAL EXAM: General: Pleasant 57 year old white male currently resting in bed he is in no acute distress HEENT normocephalic atraumatic no jugular venous distention Pulmonary: Diminished bases no accessory use Cardiac: Regular irregular atrial fibrillation on telemetry Abdomen: Soft nontender Extremities: Cool, wound VAC in place, dependent edema noted. Neuro: Awake oriented no focal deficits GU: Voiding quantity sufficient.  RESOLVED PROBLEM LIST  Hypotension due to medications; Neo-Synephrine discontinued 12/9 AKI secondary to rhabdo resolved 12/8 Acute Encephalopathy  ASSESSMENT AND PLAN    Acute hypoxic respiratory failure due to pulmonary edema, wonder about underlying obstructive airway disease -Self extubated 12/8, continues to require high flow oxygen -excellent response to lasix. Oxygen requirements down to 30% Plan Cont to wean oxygen Consider repeat lasix again after OR on 12/12 Repeat CXR PRN   Atrial fibrillation with rapid ventricular response Elevated troponin peak 1.5 Plan Cont amiodarone per cards Cont Bivalirudin given concern for HIT Ischemia eval once more stable  Cont tele   Peripheral vascular disease with thrombosed graft status post thrombectomy and multiple stents Plan For wound VAC change today 12/11  Intermittent  fluid and electrolyte imbalance: hypokalemia  Plan Replace and recheck  Diabetes type 2, uncontrolled hyperglycemia: Glucose is currently within goal Plan Cont ssi  Hypothyroidism  Plan Cont synthroid   Best Practice / Goals of Care / Disposition.   DVT PROPHYLAXIS: bival SUP: PPI NUTRITION: P.o. intake MOBILITY:*Bedrest GOALS OF CARE: Full code FAMILY DISCUSSIONS: No family around DISPOSITION: looking much better. O2 requirements improved w/ lasix PCCM will  sign off  Simonne Martinet ACNP-BC [facility] Pulmonary/Critical Care Pager # [phone] OR # [phone] if no answer  Attending Note:  57 year old male with PMH of COPD who developed respiratory failure due to pulmonary edema post op from vascular procedure.  Patient was extubated on Monday and remained on HFNC since.  Overnight, no events.  On exam, lungs with bibasilar crackles.  I reviewed CXR myself, some pulmonary edema persists but improving.  Discussed with PCCM-NP.    Acute hypoxemic respiratory failure:  - Monitor for airway protection  - Monitor closely for pulmonary edema  Hypoxemia:  - Attempt to get off HFNC today  - Keep fluid even at this point  Acute pulmonary edema:  - Lasix as ordered  - Strict I/O  A-fib:  - Amiodarone  - Bival  Ambulate as able, titrate O2 for sat of 88-92%, PCCM will sign off, please call back if needed.  Patient seen and examined, agree with above note.  I dictated the care and orders written for this patient under my direction.  Alyson Reedy, MD [phone]

## 2018-01-29 NOTE — Significant Event (Signed)
Assisted in transporting patient to short day, room 44. Patient arrived safely; placed on monitors. CBG has been checked in unit prior to transport. Staff aware patient is in the room.    Jaqulyn Chancellor

## 2018-01-29 NOTE — Anesthesia Procedure Notes (Signed)
Procedure Name: Intubation Date/Time: 01/29/2018 12:24 PM Performed by: Cleda Daub, CRNA Pre-anesthesia Checklist: Patient identified, Emergency Drugs available, Suction available and Patient being monitored Patient Re-evaluated:Patient Re-evaluated prior to induction Oxygen Delivery Method: Circle system utilized Preoxygenation: Pre-oxygenation with 100% oxygen Induction Type: IV induction Ventilation: Mask ventilation without difficulty and Mask ventilation throughout procedure Laryngoscope Size: Mac and 3 Grade View: Grade II Tube type: Oral Tube size: 8.0 mm Number of attempts: 1 Airway Equipment and Method: Stylet Placement Confirmation: ETT inserted through vocal cords under direct vision,  positive ETCO2 and breath sounds checked- equal and bilateral Secured at: 23 cm Tube secured with: Tape Dental Injury: Teeth and Oropharynx as per pre-operative assessment

## 2018-01-29 NOTE — Progress Notes (Signed)
ANTICOAGULATION CONSULT NOTE  Pharmacy Consult for Bivalirudin Indication: atrial fibrillation and thrombectomy  Allergies  Allergen Reactions  . Ciprofloxacin Rash    Patient Measurements: Height: 6\' 1"  (185.4 cm) Weight: 186 lb 4.6 oz (84.5 kg) IBW/kg (Calculated) : 79.9 Heparin Dosing Weight: 78.8 kg  Vital Signs: Temp: 98 F (36.7 C) (12/11 0738) Temp Source: Oral (12/11 0738) BP: 101/62 (12/11 1033) Pulse Rate: 52 (12/11 1033)  Labs: Recent Labs    01/27/18 0349 01/28/18 0431 01/29/18 0508  HGB 9.2* 8.2* 9.2*  HCT 29.4* 27.0* 29.4*  PLT 91* 107* 143*  APTT 58* 65* 60*  LABPROT  --   --  19.9*  INR  --   --  1.72  CREATININE 0.97 0.98 1.00  CKTOTAL 6,073*  --   --     Estimated Creatinine Clearance: 94.3 mL/min (by C-G formula based on SCr of 1 mg/dL).   Medical History: Past Medical History:  Diagnosis Date  . Carotid artery disease (HCC)    a. 60-70% RICA, totally occluded LICA, > 50% stenosed bilat ECA 04/2017.  Marland Kitchen. Chronic atrial fibrillation   . Coronary artery disease    a. remote cath in 2009 showing 30% mRCA, 30% PDA, 45% LAD, 30% diag.  . Diabetes mellitus   . Dyslipidemia   . History of kidney stones   . Hypertension   . Tobacco abuse     Medications:  Infusions:  . sodium chloride    . amiodarone Stopped (01/29/18 1015)  . bivalirudin (ANGIOMAX) infusion 0.5 mg/mL (Non-ACS indications) 0.15 mg/kg/hr (01/29/18 1000)  .  ceFAZolin (ANCEF) IV    . magnesium sulfate 1 - 4 g bolus IVPB    . potassium chloride 10 mEq (01/29/18 1016)  . sodium chloride      Assessment: 57 yo male previously on heparin and TPA for thrombolysis of L leg critical ischemia.  Platelet count remains low, but stable.  HIT unlikely, but pharmacy asked to switch heparin to bivalirudin just in case.  Also asked to increase to full dose anticoagulation 12/7. HIT Ab neg   This morning's aPTT on bivalirudin = 60, at goal.  No overt bleeding or complications reported    Goal of Therapy:  APTT 50-85 sec Monitor platelets by anticoagulation protocol: Yes   Plan:  Continue bivalirudin 0.15 mg/kg/hr Daily aPTT, CBC. F/u plans for oral anticoagulation as able.  Will the plan be for Eliquis + ASA + Plavix?  Or could ASA be stopped? To OR for fasciotomy closure - [facility]   Vinnie LevelBenjamin Helios Kohlmann, PharmD., BCPS Clinical Pharmacist Clinical phone for 01/29/18 until 3:30pm: [phone] If after 3:30pm, please refer to [facility] for unit-specific pharmacist

## 2018-01-29 NOTE — Op Note (Signed)
    Patient name: Willie Morales A Zuver MRN: [id number] DOB: 04/27/1960 Sex: male  01/29/2018 Pre-operative Diagnosis: left leg fasciotomy Post-operative diagnosis:  Same Surgeon:  Durene CalWells Letisia Schwalb Assistants:  Clinton GallantEmma Collins Procedure:   #1:  Closure of medial fasciotomy incision and placement of incisional vac   #2:  Partial closure of lateral faciotomy incision and placement of wound vac Anesthesia:  general Blood Loss:  minimal Specimens:  none  Findings:  Medial incision closed without tension.  Unable to close lateral incision due to extensive edema.  All muscles viable  Indications:  The patient comes in for wound vac change and possible fasciotomy closure  Procedure:  The patient was identified in the holding area and [facility]  The patient was then placed supine on the table. general anesthesia was administered.  The patient was prepped and draped in the usual sterile fashion.  A time out was called and antibiotics were administered.  All muscle appeared viable in all compartments.  I was able to close the medial incision with 2-0 nylon sutures and staples without tension.  I was able to place 3 nylon sutures to partially close the lateral fasciotomy.  There was still too much edema to close this wound.  A incisional vac was placed medially and a vac was placed over the open wound on the lateral incision.   Disposition:  To PACU stable   V. Durene CalWells Jhoan Schmieder, M.D. [facility] [place] Office: [phone] Pager:  [phone]

## 2018-01-29 NOTE — Progress Notes (Signed)
 Progress Note  Patient Name: Willie Morales Date of Encounter: 01/29/2018  Primary Cardiologist: Muhammad Arida, MD   Subjective   No chest pain or dyspnea.   Inpatient Medications    Scheduled Meds: . amiodarone  150 mg Intravenous Once  . aspirin  81 mg Oral Daily  . atorvastatin  80 mg Oral q1800  . cephALEXin  500 mg Oral Q8H  . chlorhexidine gluconate (MEDLINE KIT)  15 mL Mouth Rinse BID  . Chlorhexidine Gluconate Cloth  6 each Topical Daily  . clopidogrel  75 mg Oral Daily  . docusate sodium  100 mg Oral Daily  . gabapentin  300 mg Oral TID  . insulin aspart  0-15 Units Subcutaneous Q4H  . insulin glargine  15 Units Subcutaneous Daily  . ipratropium-albuterol  3 mL Nebulization Q4H  . levothyroxine  25 mcg Oral QAC breakfast  . metoprolol tartrate  25 mg Oral BID  . multivitamin with minerals  1 tablet Oral Daily  . pantoprazole  40 mg Oral Daily  . risperiDONE  1 mg Oral Daily  . sodium chloride flush  10-40 mL Intracatheter Q12H   Continuous Infusions: . sodium chloride    . amiodarone 30 mg/hr (01/29/18 0700)  . bivalirudin (ANGIOMAX) infusion 0.5 mg/mL (Non-ACS indications) Stopped (01/29/18 0657)  .  ceFAZolin (ANCEF) IV    . magnesium sulfate 1 - 4 g bolus IVPB    . magnesium sulfate 1 - 4 g bolus IVPB 2 g (01/29/18 0722)  . potassium chloride 10 mEq (01/29/18 0721)  . sodium chloride     PRN Meds: sodium chloride, alum & mag hydroxide-simeth, fentaNYL (SUBLIMAZE) injection, guaiFENesin-dextromethorphan, haloperidol lactate, hydrALAZINE, labetalol, magnesium sulfate 1 - 4 g bolus IVPB, metoCLOPramide (REGLAN) injection, metoprolol tartrate, morphine injection, ondansetron (ZOFRAN) IV, phenol, sodium chloride flush, sodium chloride flush   Vital Signs    Vitals:   01/29/18 0600 01/29/18 0700 01/29/18 0730 01/29/18 0738  BP: 113/69 139/74    Pulse: 82 84 92   Resp: (!) 37 10 20   Temp:    98 F (36.7 C)  TempSrc:    Oral  SpO2: 98% 98% 99% 100%    Weight:      Height:        Intake/Output Summary (Last 24 hours) at 01/29/2018 0809 Last data filed at 01/29/2018 0730 Gross per 24 hour  Intake 2194.46 ml  Output 5905 ml  Net -3710.54 ml   Filed Weights   01/23/18 1738 01/26/18 0402 01/27/18 0400  Weight: 78.8 kg 84.5 kg 84.5 kg    Telemetry    Atrial fib,rate 90-110 - Personally Reviewed  ECG    No AM EKG   Physical Exam    General: Well developed, well nourished, NAD  HEENT: OP clear, mucus membranes moist  SKIN: warm, dry. No rashes. Neuro: No focal deficits  Musculoskeletal: Muscle strength 5/5 all ext  Psychiatric: Mood and affect normal  Neck: No JVD, no carotid bruits, no thyromegaly, no lymphadenopathy.  Lungs:Clear bilaterally, no wheezes, rhonci, crackles Cardiovascular: Irreg irreg. No murmurs, gallops or rubs. Abdomen:Soft. Bowel sounds present. Non-tender.  Extremities: No lower extremity edema. Pulses are 2 + in the bilateral DP/PT.  Labs    Chemistry Recent Labs  Lab 01/23/18 0908  01/25/18 0350  01/27/18 0349 01/28/18 0431 01/29/18 0508  NA 136   < > 140   < > 140 141 137  K 4.0   < > 3.6   < > 3.5 3.7   3.3*  CL 103   < > 108   < > 110 109 100  CO2 22   < > 16*   < > 18* 25 28  GLUCOSE 233*   < > 283*   < > 256* 184* 208*  BUN 17   < > 13   < > 11 8 <5*  CREATININE 1.05   < > 1.29*   < > 0.97 0.98 1.00  CALCIUM 9.8   < > 8.8*   < > 7.8* 8.1* 8.4*  PROT 8.3*  --  5.8*  --   --   --   --   ALBUMIN 4.7  --  3.8  --   --   --   --   AST 24  --  218*  --   --   --   --   ALT 20  --  60*  --   --   --   --   ALKPHOS 73  --  37*  --   --   --   --   BILITOT 1.3*  --  1.9*  --   --   --   --   GFRNONAA >60   < > >60   < > >60 >60 >60  GFRAA >60   < > >60   < > >60 >60 >60  ANIONGAP 11   < > 16*   < > 12 7 9   < > = values in this interval not displayed.     Hematology Recent Labs  Lab 01/27/18 0349 01/28/18 0431 01/29/18 0508  WBC 11.7* 11.1* 11.4*  RBC 3.14* 2.83* 3.16*  HGB  9.2* 8.2* 9.2*  HCT 29.4* 27.0* 29.4*  MCV 93.6 95.4 93.0  MCH 29.3 29.0 29.1  MCHC 31.3 30.4 31.3  RDW 14.2 14.2 13.6  PLT 91* 107* 143*    Cardiac Enzymes Recent Labs  Lab 01/25/18 1000 01/25/18 1611 01/26/18 0114  TROPONINI 1.57* 1.49* 1.15*   No results for input(s): TROPIPOC in the last 168 hours.   BNPNo results for input(s): BNP, PROBNP in the last 168 hours.   DDimer No results for input(s): DDIMER in the last 168 hours.   Radiology    No results found.  Cardiac Studies   Echo 01/26/18: Left ventricle: The cavity size was normal. Wall thickness was   increased in a pattern of mild LVH. Systolic function was normal.   The estimated ejection fraction was in the range of 55% to 60%.   Wall motion was normal; there were no regional wall motion   abnormalities. The study was not technically sufficient to allow   evaluation of LV diastolic dysfunction due to atrial   fibrillation. - Mitral valve: There was moderate regurgitation. - Left atrium: The atrium was severely dilated. - Right ventricle: Systolic function was mildly reduced. - Atrial septum: No defect or patent foramen ovale was identified. - Tricuspid valve: There was mild regurgitation. - Systemic veins: Unable to accurately assess IVC as patient is on   ventilator.  Patient Profile     57 y.o. male with chronic atrial fibrillation, non-obstructive CAD, diabetes, hypertension, hyperlipidemia, and PAD status post recent peripheral bypass complicated by re-obstruction.  He is currently hospitalized with critical limb ischemia complicated by atrial fibrillation with rapid ventricular response.  Assessment & Plan    1. Atrial fibrillation with rapid ventricular response: Rate controlled on IV amiodarone and po metoprolol. Continue IV amiodarone today and likely switch   to po amiodarone tomorrow when surgical procedures are complete. Will need to resume Eliquis when no further procedures are indicated. Continue  bivalirudin for now with concern for HIT.     2. Elevated troponin: He is known to have CAD. Mild troponin elevation on admission. Echo 01/26/18 with normal wall motion, normal LV systolic function, LKJZ=79-15%. Will plan ischemic evaluation when his acute issues are stable.   3. PAD/Critical Limb ischemia: Mr. Hovater had thrombosis of the below the knee popliteal bypass and underwent cutdown with graft thrombectomy and patch angioplasty 08/2017.  Went to the OR 12/6 for mechanical thrombectomy of the L femoropopliteal bypass with balloon angioplasty and stent placement in the L posterior tibial, L peroneal, L tibiopernoneal trunk, L common iliac and L exernal iliac artereis.  He had a compartment fasciotomy due to high risk for compartment syndrome.VVS following.   4. Acute diastolic CHF: Large fluid resuscitation during admission. Diuresed well with IV Lasix yesterday. Renal function is stable. Continue IV Lasix.    For questions or updates, please contact [facility] Please consult [email or web address] for contact info under        Signed, Lauree Chandler, MD  01/29/2018, 8:09 AM

## 2018-01-29 NOTE — Significant Event (Signed)
Noted HR in the 50s when RN entered room to place on nasal cannula for OR transport. Reviewed monitor and patient had converted to sinus at 1007am. RN called Dr. Clifton JamesMcAlhany regarding this and clarifications for amiodarone drip. Per MD, RN can turned off amiodarone and MD will put in orders for oral amiodarone.   Also, called Dr. Myra GianottiBrabham regarding Angiomax drip. Per surgeon, to leave drip as is.     Willie Morales

## 2018-01-29 NOTE — Progress Notes (Signed)
eLink Physician-Brief Progress Note Patient Name: Willie Morales DOB: 01/25/1961 MRN: [id number]   Date of Service  01/29/2018  HPI/Events of Note  Discussed with bedside ICU RN abnormal labs.  eICU Interventions  K and Mg repleted.     Intervention Category Intermediate Interventions: Electrolyte abnormality - evaluation and management  Larinda ButteryVanessa Neville Walston 01/29/2018, 6:59 AM

## 2018-01-29 NOTE — Anesthesia Procedure Notes (Signed)
Performed by: Javarie Crisp, CRNA       

## 2018-01-29 NOTE — Interval H&P Note (Signed)
History and Physical Interval Note:  01/29/2018 11:03 AM  Willie Morales  has presented today for surgery, with the diagnosis of PERIPHERAL VASCULAR DISEASE S/P FASCIOTOMY LEFT LOWER EXTREMITY  The various methods of treatment have been discussed with the patient and family. After consideration of risks, benefits and other options for treatment, the patient has consented to  Procedure(s): FASCIOTOMY CLOSURE LEFT LOWER EXTREMITY (Left) as a surgical intervention .  The patient's history has been reviewed, patient examined, no change in status, stable for surgery.  I have reviewed the patient's chart and labs.  Questions were answered to the patient's satisfaction.     Durene CalWells Esai Stecklein

## 2018-01-30 ENCOUNTER — Encounter (HOSPITAL_COMMUNITY): Payer: Self-pay | Admitting: Surgery

## 2018-01-30 LAB — CBC
HCT: 24.8 % — ABNORMAL LOW (ref 39.0–52.0)
Hemoglobin: 7.6 g/dL — ABNORMAL LOW (ref 13.0–17.0)
MCH: 28.8 pg (ref 26.0–34.0)
MCHC: 30.6 g/dL (ref 30.0–36.0)
MCV: 93.9 fL (ref 80.0–100.0)
Platelets: 164 10*3/uL (ref 150–400)
RBC: 2.64 MIL/uL — ABNORMAL LOW (ref 4.22–5.81)
RDW: 13.6 % (ref 11.5–15.5)
WBC: 11 10*3/uL — ABNORMAL HIGH (ref 4.0–10.5)
nRBC: 0 % (ref 0.0–0.2)

## 2018-01-30 LAB — BASIC METABOLIC PANEL
Anion gap: 11 (ref 5–15)
Anion gap: 10 (ref 5–15)
BUN: 7 mg/dL (ref 6–20)
BUN: 9 mg/dL (ref 6–20)
CO2: 24 mmol/L (ref 22–32)
CO2: 26 mmol/L (ref 22–32)
Calcium: 8.2 mg/dL — ABNORMAL LOW (ref 8.9–10.3)
Calcium: 8.6 mg/dL — ABNORMAL LOW (ref 8.9–10.3)
Chloride: 102 mmol/L (ref 98–111)
Chloride: 101 mmol/L (ref 98–111)
Creatinine, Ser: 0.99 mg/dL (ref 0.61–1.24)
Creatinine, Ser: 0.91 mg/dL (ref 0.61–1.24)
GFR calc Af Amer: >60 mL/min (ref >60)
GFR calc Af Amer: >60 mL/min (ref >60)
GFR calc non Af Amer: >60 mL/min (ref >60)
GFR calc non Af Amer: >60 mL/min (ref >60)
Glucose, Bld: 125 mg/dL — ABNORMAL HIGH (ref 70–99)
Glucose, Bld: 245 mg/dL — ABNORMAL HIGH (ref 70–99)
Potassium: 3.6 mmol/L (ref 3.5–5.1)
Potassium: 3.4 mmol/L — ABNORMAL LOW (ref 3.5–5.1)
Sodium: 137 mmol/L (ref 135–145)
Sodium: 137 mmol/L (ref 135–145)

## 2018-01-30 LAB — APTT: aPTT: 55 seconds — ABNORMAL HIGH (ref 24–36)

## 2018-01-30 LAB — GLUCOSE, CAPILLARY
Glucose-Capillary: 119 mg/dL — ABNORMAL HIGH (ref 70–99)
Glucose-Capillary: 146 mg/dL — ABNORMAL HIGH (ref 70–99)
Glucose-Capillary: 220 mg/dL — ABNORMAL HIGH (ref 70–99)
Glucose-Capillary: 93 mg/dL (ref 70–99)
Glucose-Capillary: 229 mg/dL — ABNORMAL HIGH (ref 70–99)

## 2018-01-30 LAB — MAGNESIUM: Magnesium: 1.9 mg/dL (ref 1.7–2.4)

## 2018-01-30 LAB — PHOSPHORUS: Phosphorus: 2.9 mg/dL (ref 2.5–4.6)

## 2018-01-30 MED ORDER — ACETAMINOPHEN 500 MG PO TABS
1000.0000 mg | ORAL_TABLET | Freq: Four times a day (QID) | ORAL | Status: DC | PRN
  Administered 2018-01-30: 1000 mg via ORAL
  Filled 2018-01-30: qty 2

## 2018-01-30 MED ORDER — HYDROCODONE-ACETAMINOPHEN 5-325 MG PO TABS
1.0000 | ORAL_TABLET | Freq: Four times a day (QID) | ORAL | Status: DC | PRN
  Administered 2018-01-30: 1 via ORAL
  Administered 2018-01-31: 2 via ORAL
  Administered 2018-01-31: 1 via ORAL
  Filled 2018-01-30: qty 2
  Filled 2018-01-30 (×2): qty 1

## 2018-01-30 MED ORDER — POTASSIUM CHLORIDE CRYS ER 20 MEQ PO TBCR
40.0000 meq | EXTENDED_RELEASE_TABLET | Freq: Once | ORAL | Status: AC
  Administered 2018-01-31: 40 meq via ORAL
  Filled 2018-01-30: qty 2

## 2018-01-30 MED ORDER — FUROSEMIDE 10 MG/ML IJ SOLN
40.0000 mg | Freq: Once | INTRAMUSCULAR | Status: AC
  Administered 2018-01-30: 40 mg via INTRAVENOUS
  Filled 2018-01-30: qty 4

## 2018-01-30 NOTE — Progress Notes (Signed)
eLink Physician-Brief Progress Note Patient Name: Willie Morales DOB: 03/27/1960 MRN: [id number]   Date of Service  01/30/2018  HPI/Events of Note  RN concerned about urinary retention.  Pt has not voided in a few hours.  Bladder scan revealed >45750ml.  eICU Interventions  Straight cath x 1.      Intervention Category Minor Interventions: Other:  Willie Morales 01/30/2018, 12:40 AM

## 2018-01-30 NOTE — NC FL2 (Signed)
[place] MEDICAID FL2 LEVEL OF CARE SCREENING TOOL     IDENTIFICATION  Patient Name: BREYON SIGG Birthdate: Aug 08, 1960 Sex: male Admission Date (Current Location): 01/23/2018  [facility] and [place] Number:  Producer, television/film/video and Address:  [facility] [place], [address] [place], [place] [address]      Provider Number: [id number]  Attending Physician Name and Address:  Cephus Shelling, MD  Relative Name and Phone Number:       Current Level of Care: Hospital Recommended Level of Care: Skilled Nursing Facility Prior Approval Number:    Date Approved/Denied:   PASRR Number: [id number]  Discharge Plan: SNF    Current Diagnoses: Patient Active Problem List   Diagnosis Date Noted  . Hypokalemia   . Hypervolemia   . Chronic atrial fibrillation 01/24/2018  . Essential hypertension 01/24/2018  . Hyperlipidemia 01/24/2018  . Uncontrolled diabetes mellitus (HCC) 01/24/2018  . Tobacco abuse 01/24/2018  . Acute hypoxemic respiratory failure (HCC)   . Acute pulmonary edema (HCC)   . Atrial fibrillation with rapid ventricular response (HCC)   . Limb ischemia 01/23/2018  . Pressure injury of skin 09/18/2017  . Ischemic leg 09/17/2017  . PAD (peripheral artery disease) (HCC) 05/09/2017  . Atherosclerosis of artery of extremity with rest pain (HCC)   . Preoperative cardiovascular examination   . Leukocytosis 12/10/2016  . Coronary artery disease     Orientation RESPIRATION BLADDER Height & Weight     Self, Situation, Place, Time  Normal Continent Weight: 186 lb 4.6 oz (84.5 kg) Height:  6\' 1"  (185.4 cm)  BEHAVIORAL SYMPTOMS/MOOD NEUROLOGICAL BOWEL NUTRITION STATUS      Continent Diet(heart healthy/carb modified, thin liquids)  AMBULATORY STATUS COMMUNICATION OF NEEDS Skin   Extensive Assist Verbally Wound Vac(Wound vac on left leg)                       Personal Care Assistance Level of Assistance  Bathing, Feeding, Dressing Bathing  Assistance: Maximum assistance Feeding assistance: Limited assistance Dressing Assistance: Maximum assistance     Functional Limitations Info  Sight, Hearing, Speech Sight Info: Adequate Hearing Info: Adequate Speech Info: Adequate    SPECIAL CARE FACTORS FREQUENCY  PT (By licensed PT), OT (By licensed OT)     PT Frequency: 2x OT Frequency: 2x            Contractures Contractures Info: Not present    Additional Factors Info  Code Status, Allergies Code Status Info: Full Code Allergies Info: Ciprofloxacin           Current Medications (01/30/2018):  This is the current hospital active medication list Current Facility-Administered Medications  Medication Dose Route Frequency Provider Last Rate Last Dose  . 0.9 %  sodium chloride infusion  250 mL Intravenous PRN Clinton Gallant M, PA-C      . alum & mag hydroxide-simeth (MAALOX/MYLANTA) 200-200-20 MG/5ML suspension 15-30 mL  15-30 mL Oral Q2H PRN Clinton Gallant M, PA-C      . amiodarone (NEXTERONE) 1.8 mg/mL load via infusion 150 mg  150 mg Intravenous Once Clinton Gallant M, PA-C       Followed by  . amiodarone (NEXTERONE PREMIX) 360-4.14 MG/200ML-% (1.8 mg/mL) IV infusion  30 mg/hr Intravenous Continuous Lars Mage, PA-C   Stopped at 01/29/18 1015  . amiodarone (PACERONE) tablet 200 mg  200 mg Oral BID Clinton Gallant M, PA-C   200 mg at 01/30/18 5009  . aspirin chewable tablet  81 mg  81 mg Oral Daily Clinton GallantCollins, Emma M, PA-C   81 mg at 01/30/18 [id number]  . atorvastatin (LIPITOR) tablet 80 mg  80 mg Oral q1800 Lars MageCollins, Emma M, PA-C   80 mg at 01/29/18 1740  . bivalirudin (ANGIOMAX) 250 mg in sodium chloride 0.9 % 500 mL (0.5 mg/mL) infusion  0.15 mg/kg/hr Intravenous Continuous Clinton GallantCollins, Emma M, PA-C 23.6 mL/hr at 01/29/18 2000 0.15 mg/kg/hr at 01/29/18 2000  . cephALEXin (KEFLEX) capsule 500 mg  500 mg Oral Q8H CollinsKara Mead, Emma M, PA-C   500 mg at 01/30/18 [id number]  . Chlorhexidine Gluconate Cloth 2 % PADS 6 each  6 each Topical Daily  Lars MageCollins, Emma M, PA-C   6 each at 01/29/18 0850  . clopidogrel (PLAVIX) tablet 75 mg  75 mg Oral Daily Clinton GallantCollins, Emma M, PA-C   75 mg at 01/30/18 [id number]  . docusate sodium (COLACE) capsule 100 mg  100 mg Oral Daily Clinton GallantCollins, Emma M, PA-C   100 mg at 01/30/18 [id number]  . fentaNYL (SUBLIMAZE) injection 25-100 mcg  25-100 mcg Intravenous Q2H PRN Lars MageCollins, Emma M, PA-C   50 mcg at 01/30/18 [id number]  . gabapentin (NEURONTIN) capsule 300 mg  300 mg Oral TID Lars Mageollins, Emma M, PA-C   300 mg at 01/30/18 [id number]  . guaiFENesin-dextromethorphan (ROBITUSSIN DM) 100-10 MG/5ML syrup 15 mL  15 mL Oral Q4H PRN Clinton Gallantollins, Emma M, PA-C      . haloperidol lactate (HALDOL) injection 1-4 mg  1-4 mg Intravenous Q3H PRN Clinton Gallantollins, Emma M, PA-C   4 mg at 01/26/18 0725  . hydrALAZINE (APRESOLINE) injection 10 mg  10 mg Intravenous Q4H PRN Lars Mageollins, Emma M, PA-C   10 mg at 01/24/18 0432  . insulin aspart (novoLOG) injection 0-15 Units  0-15 Units Subcutaneous Q4H Lars MageCollins, Emma M, [place]   5 Units at 01/30/18 1236  . insulin glargine (LANTUS) injection 15 Units  15 Units Subcutaneous Daily Lars MageCollins, Emma M, [place]C   15 Units at 01/30/18 [id number]  . ipratropium-albuterol (DUONEB) 0.5-2.5 (3) MG/3ML nebulizer solution 3 mL  3 mL Nebulization TID Alyson ReedyYacoub, Wesam G, MD   3 mL at 01/30/18 1407  . labetalol (NORMODYNE,TRANDATE) injection 10 mg  10 mg Intravenous Q10 min PRN Lars Mageollins, Emma M, PA-C   10 mg at 01/28/18 0515  . levothyroxine (SYNTHROID, LEVOTHROID) tablet 25 mcg  25 mcg Oral QAC breakfast Clinton GallantCollins, Emma M, PA-C   25 mcg at 01/30/18 [id number]  . magnesium sulfate IVPB 2 g 50 mL  2 g Intravenous Daily PRN Clinton Gallantollins, Emma M, PA-C      . metoCLOPramide (REGLAN) injection 5 mg  5 mg Intravenous Q6H PRN Clinton Gallantollins, Emma M, PA-C   5 mg at 01/27/18 1607  . metoprolol tartrate (LOPRESSOR) injection 5 mg  5 mg Intravenous Q6H PRN Clinton Gallantollins, Emma M, PA-C   5 mg at 01/27/18 0835  . metoprolol tartrate (LOPRESSOR) tablet 25 mg  25 mg Oral BID Clinton GallantCollins, Emma M, PA-C   25 mg at  01/30/18 [id number]  . morphine 4 MG/ML injection 4 mg  4 mg Intravenous Q2H PRN Lars MageCollins, Emma M, PA-C   4 mg at 01/26/18 [id number]  . multivitamin with minerals tablet 1 tablet  1 tablet Oral Daily Lars MageCollins, Emma M, PA-C   1 tablet at 01/30/18 [id number]  . ondansetron (ZOFRAN) injection 4 mg  4 mg Intravenous Q6H PRN Lars MageCollins, Emma M, PA-C   4 mg at 01/28/18 [id number]  . pantoprazole (PROTONIX) EC tablet 40 mg  40 mg Oral  Daily Clinton Gallant M, PA-C   40 mg at 01/30/18 1610  . phenol (CHLORASEPTIC) mouth spray 1 spray  1 spray Mouth/Throat PRN Clinton Gallant M, PA-C      . risperiDONE (RISPERDAL) tablet 1 mg  1 mg Oral Daily Clinton Gallant M, PA-C   1 mg at 01/30/18 9604  . sodium chloride 0.9 % bolus 1,000 mL  1,000 mL Intravenous Once Clinton Gallant M, PA-C      . sodium chloride flush (NS) 0.9 % injection 10-40 mL  10-40 mL Intracatheter Q12H Clinton Gallant M, PA-C   20 mL at 01/30/18 0934  . sodium chloride flush (NS) 0.9 % injection 10-40 mL  10-40 mL Intracatheter PRN Clinton Gallant M, PA-C      . sodium chloride flush (NS) 0.9 % injection 3 mL  3 mL Intravenous PRN Clinton Gallant M, PA-C   3 mL at 01/27/18 1138     Discharge Medications: Please see discharge summary for a list of discharge medications.  Relevant Imaging Results:  Relevant Lab Results:   Additional Information SSN: [id number]  Maree Krabbe, LCSW

## 2018-01-30 NOTE — Progress Notes (Signed)
Pt had multiple 10-15 beat runs PMVT around 22:00. Denies symptoms. Labs from AM showed K 3.6, not replaced. Was diuresed during day. Dr. Chestine Sporelark notified, given verbal orders for BMet, Mag, Phos and to call back if replacement orders are needed.  Pt resting comfortably. Will continue to monitor.

## 2018-01-30 NOTE — Progress Notes (Signed)
Physical Therapy Treatment Patient Details Name: Willie Morales MRN: [id number] DOB: [birth year] Today's Date: 01/30/2018    History of Present Illness 56 year old male with history of peripheral vascular disease and popliteal bypass admitted with thrombosed graft status post thrombectomy and multiple stents under went mechanical thrombectomy of the left femoropopliteal bypass graft plus balloon angioplasty with multiple stents. Patient also underwent compartment fasciotomy due to high risk for compartment syndrome.    PT Comments    Patient progressing towards his physical therapy goals. Increased ambulation distance to 50 feet x 2 with walker and close chair follow utilized. Pt requiring moderate assistance due to multiple episodes of loss of balance with challenge or dual tasking. Pt utilizing LLE touchdown weightbearing despite cueing for increased weightbearing. Pt has poor awareness of deficits and is impulsive. Presents as a significantly high fall risk secondary to decreased safety awareness and balance impairments. He cannot ambulate without assistance. Extensively discussed SNF recommendation with pt; he states he will be "fine once he gets home." Will follow acutely.   Follow Up Recommendations  SNF;Supervision for mobility/OOB (HHPT if pt refuses)     Equipment Recommendations  Rolling walker with 5" wheels    Recommendations for Other Services       Precautions / Restrictions Precautions Precautions: Fall Precaution Comments: wound vac Restrictions Weight Bearing Restrictions: No    Mobility  Bed Mobility               General bed mobility comments: OOB in chair  Transfers Overall transfer level: Needs assistance   Transfers: Sit to/from Stand Sit to Stand: Min assist         General transfer comment: Min assist to stand and steady with cues for hand placement  Ambulation/Gait Ambulation/Gait assistance: Mod assist Gait Distance (Feet): 50  Feetx2 Assistive device: Rolling walker (2 wheeled) Gait Pattern/deviations: Antalgic;Decreased weight shift to left;Step-through pattern Gait velocity: decreased   General Gait Details: Cues for sequencing and increasing LLE weightbearing. Moderate assistance needed due to multiple episodes of LOB likely secondary to pt utilizing touchdown weightbearing versus WBAT. Requiring one seated rest break and close chair follow utilized   Social research officer, government Rank[employer] (Stroke Patients Only)       Balance Overall balance assessment: Needs assistance Sitting-balance support: Feet supported Sitting balance-Leahy Scale: Good     Standing balance support: Bilateral upper extremity supported;During functional activity Standing balance-Leahy Scale: Poor                              Cognition Arousal/Alertness: Awake/alert Behavior During Therapy: Impulsive Overall Cognitive Status: No family/caregiver present to determine baseline cognitive functioning                                 General Comments: Poor awareness of deficits      Exercises      General Comments  VSS      Pertinent Vitals/Pain Pain Assessment: Faces Faces Pain Scale: Hurts even more Pain Location: left LE with movement Pain Descriptors / Indicators: Throbbing;Tender;Sore;Grimacing;Operative site guarding Pain Intervention(s): Monitored during session    Home Living                      Prior Function  PT Goals (current goals can now be found in the care plan section) Acute Rehab PT Goals Patient Stated Goal: to go home and be indpendent PT Goal Formulation: With patient Time For Goal Achievement: 02/11/18 Potential to Achieve Goals: Good Progress towards PT goals: Progressing toward goals    Frequency    Min 3X/week      PT Plan Current plan remains appropriate    Co-evaluation              AM-PAC PT  "6 Clicks" Mobility   Outcome Measure  Help needed turning from your back to your side while in a flat bed without using bedrails?: A Little Help needed moving from lying on your back to sitting on the side of a flat bed without using bedrails?: A Little Help needed moving to and from a bed to a chair (including a wheelchair)?: A Little Help needed standing up from a chair using your arms (e.g., wheelchair or bedside chair)?: A Lot Help needed to walk in hospital room?: A Lot Help needed climbing 3-5 steps with a railing? : A Lot 6 Click Score: 15    End of Session Equipment Utilized During Treatment: Gait belt Activity Tolerance: Patient tolerated treatment well Patient left: with call bell/phone within reach;in chair;with chair alarm set Nurse Communication: Mobility status PT Visit Diagnosis: Difficulty in walking, not elsewhere classified (R26.2);Pain Pain - Right/Left: Left Pain - part of body: Leg     Time: 1130-1153 PT Time Calculation (min) (ACUTE ONLY): 23 min  Charges:  $Gait Training: 23-37 mins                     Laurina Bustlearoline Gerri Acre, [place] CarolinaPT, DPT Acute Rehabilitation Services Pager [phone] Office [phone]    Vanetta MuldersCarloine H Chenel Wernli 01/30/2018, 2:30 PM

## 2018-01-30 NOTE — Anesthesia Postprocedure Evaluation (Signed)
Anesthesia Post Note  Patient: Willie Morales  Procedure(s) Performed: FASCIOTOMY CLOSURE LEFT LOWER EXTREMITY (Left Leg Lower) APPLICATION OF WOUND VAC LEFT LOWER LEG MEDIAL AND LATERAL SIDES (Left Leg Lower)     Patient location during evaluation: PACU Anesthesia Type: General Level of consciousness: awake and alert, awake and oriented Pain management: pain level controlled Vital Signs Assessment: post-procedure vital signs reviewed and stable Respiratory status: spontaneous breathing, nonlabored ventilation, respiratory function stable and patient connected to nasal cannula oxygen Cardiovascular status: blood pressure returned to baseline and stable Postop Assessment: no apparent nausea or vomiting Anesthetic complications: no    Last Vitals:  Vitals:   01/30/18 0800 01/30/18 0802  BP: (!) 153/104 (!) 182/58  Pulse: 67 88  Resp: 20 20  Temp:  36.5 C  SpO2: 100% 100%    Last Pain:  Vitals:   01/30/18 0617  TempSrc:   PainSc: 10-Worst pain ever                 Cecile HearingStephen Edward Turk

## 2018-01-30 NOTE — Clinical Social Work Note (Signed)
Clinical Social Work Assessment  Patient Details  Name: Willie CarmelGeorge A Furio MRN: [id number] Date of Birth: 04/28/1960  Date of referral:  01/30/18               Reason for consult:  Facility Placement                Permission sought to share information with:    Permission granted to share information::     Name::        Agency::     Relationship::     Contact Information:     Housing/Transportation Living arrangements for the past 2 months:  Single Family Home Source of Information:  Patient Patient Interpreter Needed:  None Criminal Activity/Legal Involvement Pertinent to Current Situation/Hospitalization:  No - Comment as needed Significant Relationships:  Siblings Lives with:  Self Do you feel safe going back to the place where you live?  Yes Need for family participation in patient care:  No (Coment)  Care giving concerns:  Pt is alert and oriented x4.   Social Worker assessment / plan:  CSW spoke with pt. Pt states he is not going to rehab that he has a dog to take care of at home. At this time pt is refusing SNF. RNCM notified.   Employment status:    Database administratornsurance information:  Managed Medicare PT Recommendations:  Skilled Nursing Facility Information / Referral to community resources:  Skilled Nursing Facility  Patient/Family's Response to care: Pt refusing SNF at this time.  Patient/Family's Understanding of and Emotional Response to Diagnosis, Current Treatment, and Prognosis:  Pt refusing SNF at this time. Pt denies any futher questions or concerns at this time. Clinical Social Worker will sign off for now as social work intervention is no longer needed. Please consult us again if new need arises.    Emotional Assessment Appearance:  Appears stated age Attitude/Demeanor/Rapport:  (Patient was appropriate) Affect (typically observed):  Accepting, Appropriate, Calm Orientation:  Oriented to Self, Oriented to Place, Oriented to  Time, Oriented to Situation Alcohol /  Substance use:  Not Applicable Psych involvement (Current and /or in the community):  No (Comment)  Discharge Needs  Concerns to be addressed:  Patient refuses services, Care Coordination, Basic Needs Readmission within the last 30 days:  No Current discharge risk:  Dependent with Mobility, Lives alone Barriers to Discharge:  Continued Medical Work up   Pacific MutualBridget A Garren Greenman, LCSW 01/30/2018, 2:32 PM

## 2018-01-30 NOTE — Progress Notes (Signed)
ANTICOAGULATION CONSULT NOTE  Pharmacy Consult for Bivalirudin Indication: atrial fibrillation and thrombectomy  Allergies  Allergen Reactions  . Ciprofloxacin Rash    Patient Measurements: Height: 6\' 1"  (185.4 cm) Weight: 186 lb 4.6 oz (84.5 kg) IBW/kg (Calculated) : 79.9 Heparin Dosing Weight: 78.8 kg  Vital Signs: Temp: 97.7 F (36.5 C) (12/12 0802) Temp Source: Oral (12/12 0400) BP: 163/71 (12/12 1000) Pulse Rate: 81 (12/12 1000)  Labs: Recent Labs    01/28/18 0431 01/29/18 0508 01/30/18 0415  HGB 8.2* 9.2* 7.6*  HCT 27.0* 29.4* 24.8*  PLT 107* 143* 164  APTT 65* 60* 55*  LABPROT  --  19.9*  --   INR  --  1.72  --   CREATININE 0.98 1.00 0.99    Estimated Creatinine Clearance: 95.3 mL/min (by C-G formula based on SCr of 0.99 mg/dL).   Medical History: Past Medical History:  Diagnosis Date  . Carotid artery disease (HCC)    a. 60-70% RICA, totally occluded LICA, > 50% stenosed bilat ECA 04/2017.  Marland Kitchen. Chronic atrial fibrillation   . Coronary artery disease    a. remote cath in 2009 showing 30% mRCA, 30% PDA, 45% LAD, 30% diag.  . Diabetes mellitus   . Dyslipidemia   . History of kidney stones   . Hypertension   . Tobacco abuse     Medications:  Infusions:  . sodium chloride    . amiodarone Stopped (01/29/18 1015)  . bivalirudin (ANGIOMAX) infusion 0.5 mg/mL (Non-ACS indications) 0.15 mg/kg/hr (01/29/18 2000)  . magnesium sulfate 1 - 4 g bolus IVPB    . sodium chloride      Assessment: 57 yo male previously on heparin and TPA for thrombolysis of L leg critical ischemia.  Platelet count remains low, but stable.  HIT unlikely, but pharmacy asked to switch heparin to bivalirudin just in case.  Also asked to increase to full dose anticoagulation 12/7. HIT Ab neg   S/p fasciotomy This morning's aPTT on bivalirudin = 55, at goal.  No overt bleeding or complications reported   Goal of Therapy:  APTT 50-85 sec Monitor platelets by anticoagulation protocol:  Yes   Plan:  Continue bivalirudin 0.15 mg/kg/hr Daily aPTT, CBC. F/u plans for oral anticoagulation as able.  Will the plan be for Eliquis + ASA + Plavix?  Or could ASA be stopped?  Vinnie LevelBenjamin Jaskarn Schweer, PharmD., BCPS Clinical Pharmacist Clinical phone for 01/30/18 until 3:30pm: [phone] If after 3:30pm, please refer to [facility] for unit-specific pharmacist

## 2018-01-30 NOTE — Progress Notes (Addendum)
    Subjective  -  Legs feel ok Want [place] home   Physical Exam:  Doppler pedal signals (PT is best) Vac in place       Assessment/Plan:    AFIB:  Will need transition back to Eliquis.  Appreciate cardiology assistance Transfer to 4E Possible D/C tomrrow if clears PT Will d/c with vac. He is not interested in a skin graft to close fasciotomy Will need Rx for plavix x 3 months at discahrge  Willie Morales 01/30/2018 11:24 AM --  Vitals:   01/30/18 0933 01/30/18 1000  BP: (!) 141/81 (!) 163/71  Pulse: 81 81  Resp: 17 (!) 24  Temp:    SpO2: 100% 97%    Intake/Output Summary (Last 24 hours) at 01/30/2018 1124 Last data filed at 01/30/2018 1030 Gross per 24 hour  Intake 1536.51 ml  Output 1626 ml  Net -89.49 ml     Laboratory CBC    Component Value Date/Time   WBC 11.0 (H) 01/30/2018 0415   HGB 7.6 (L) 01/30/2018 0415   HCT 24.8 (L) 01/30/2018 0415   PLT 164 01/30/2018 0415    BMET    Component Value Date/Time   NA 137 01/30/2018 0415   K 3.6 01/30/2018 0415   CL 102 01/30/2018 0415   CO2 24 01/30/2018 0415   GLUCOSE 125 (H) 01/30/2018 0415   BUN 7 01/30/2018 0415   CREATININE 0.99 01/30/2018 0415   CALCIUM 8.2 (L) 01/30/2018 0415   GFRNONAA >60 01/30/2018 0415   GFRAA >60 01/30/2018 0415    COAG Lab Results  Component Value Date   INR 1.72 01/29/2018   INR 1.24 01/23/2018   INR 1.13 09/17/2017   No results found for: PTT  Antibiotics Anti-infectives (From admission, onward)   Start     Dose/Rate Route Frequency Ordered Stop   01/29/18 0600  ceFAZolin (ANCEF) IVPB 2g/100 mL premix     2 g 200 mL/hr over 30 Minutes Intravenous To Short Stay 01/28/18 0751 01/29/18 1230   01/25/18 0930  cephALEXin (KEFLEX) capsule 500 mg     500 mg Oral Every 8 hours 01/25/18 0919     01/24/18 2130  ceFAZolin (ANCEF) IVPB 2g/100 mL premix     2 g 200 mL/hr over 30 Minutes Intravenous Every 8 hours 01/24/18 2000 01/25/18 0657   01/24/18 1500   ceFAZolin (ANCEF) IVPB 2g/100 mL premix     2 g 200 mL/hr over 30 Minutes Intravenous  Once 01/24/18 1453 01/24/18 1532   01/24/18 1454  ceFAZolin (ANCEF) 2-4 GM/100ML-% IVPB    Note to Pharmacy:  Sabino NiemannHogue, Samantha   : cabinet override      01/24/18 1454 01/24/18 1532       V. Charlena CrossWells Curley Fayette IV, M.D. [facility] Office: [phone] Pager:  [phone]

## 2018-01-30 NOTE — Progress Notes (Addendum)
Progress Note  Patient Name: Willie Morales Date of Encounter: 01/30/2018  Primary Cardiologist: Kathlyn Sacramento, MD   Subjective   No complaints this am. No chest pain or dyspnea.   Inpatient Medications    Scheduled Meds: . amiodarone  150 mg Intravenous Once  . amiodarone  200 mg Oral BID  . aspirin  81 mg Oral Daily  . atorvastatin  80 mg Oral q1800  . cephALEXin  500 mg Oral Q8H  . chlorhexidine gluconate (MEDLINE KIT)  15 mL Mouth Rinse BID  . Chlorhexidine Gluconate Cloth  6 each Topical Daily  . clopidogrel  75 mg Oral Daily  . docusate sodium  100 mg Oral Daily  . gabapentin  300 mg Oral TID  . insulin aspart  0-15 Units Subcutaneous Q4H  . insulin glargine  15 Units Subcutaneous Daily  . ipratropium-albuterol  3 mL Nebulization TID  . levothyroxine  25 mcg Oral QAC breakfast  . metoprolol tartrate  25 mg Oral BID  . multivitamin with minerals  1 tablet Oral Daily  . pantoprazole  40 mg Oral Daily  . risperiDONE  1 mg Oral Daily  . sodium chloride flush  10-40 mL Intracatheter Q12H   Continuous Infusions: . sodium chloride    . amiodarone Stopped (01/29/18 1015)  . bivalirudin (ANGIOMAX) infusion 0.5 mg/mL (Non-ACS indications) 0.15 mg/kg/hr (01/29/18 2000)  . magnesium sulfate 1 - 4 g bolus IVPB    . sodium chloride     PRN Meds: sodium chloride, alum & mag hydroxide-simeth, fentaNYL (SUBLIMAZE) injection, guaiFENesin-dextromethorphan, haloperidol lactate, hydrALAZINE, labetalol, magnesium sulfate 1 - 4 g bolus IVPB, metoCLOPramide (REGLAN) injection, metoprolol tartrate, morphine injection, ondansetron (ZOFRAN) IV, phenol, sodium chloride flush, sodium chloride flush   Vital Signs    Vitals:   01/30/18 0500 01/30/18 0600 01/30/18 0700 01/30/18 0744  BP: 124/64 136/85 140/80   Pulse: 72 66 (!) 54   Resp: 14 (!) 22 19   Temp:      TempSrc:      SpO2: 100% 97% 100% 100%  Weight:      Height:        Intake/Output Summary (Last 24 hours) at  01/30/2018 0800 Last data filed at 01/30/2018 0700 Gross per 24 hour  Intake 1495.7 ml  Output 951 ml  Net 544.7 ml   Filed Weights   01/23/18 1738 01/26/18 0402 01/27/18 0400  Weight: 78.8 kg 84.5 kg 84.5 kg    Telemetry    sinus - Personally Reviewed  ECG    No AM EKG   Physical Exam   General: Well developed, well nourished, NAD  HEENT: OP clear, mucus membranes moist  SKIN: warm, dry. No rashes. Neuro: No focal deficits  Musculoskeletal: Muscle strength 5/5 all ext  Psychiatric: Mood and affect normal  Neck: No JVD, no carotid bruits, no thyromegaly, no lymphadenopathy.  Lungs:Clear bilaterally, no wheezes, rhonci, crackles Cardiovascular: Regular rate and rhythm. No murmurs, gallops or rubs. Abdomen:Soft. Bowel sounds present. Non-tender.  Extremities: No lower extremity edema. Wound vac left leg x 2.    Labs    Chemistry Recent Labs  Lab 01/23/18 0908  01/25/18 0350  01/28/18 0431 01/29/18 0508 01/30/18 0415  NA 136   < > 140   < > 141 137 137  K 4.0   < > 3.6   < > 3.7 3.3* 3.6  CL 103   < > 108   < > 109 100 102  CO2 22   < >  16*   < > '25 28 24  ' GLUCOSE 233*   < > 283*   < > 184* 208* 125*  BUN 17   < > 13   < > 8 <5* 7  CREATININE 1.05   < > 1.29*   < > 0.98 1.00 0.99  CALCIUM 9.8   < > 8.8*   < > 8.1* 8.4* 8.2*  PROT 8.3*  --  5.8*  --   --   --   --   ALBUMIN 4.7  --  3.8  --   --   --   --   AST 24  --  218*  --   --   --   --   ALT 20  --  60*  --   --   --   --   ALKPHOS 73  --  37*  --   --   --   --   BILITOT 1.3*  --  1.9*  --   --   --   --   GFRNONAA >60   < > >60   < > >60 >60 >60  GFRAA >60   < > >60   < > >60 >60 >60  ANIONGAP 11   < > 16*   < > '7 9 11   ' < > = values in this interval not displayed.     Hematology Recent Labs  Lab 01/28/18 0431 01/29/18 0508 01/30/18 0415  WBC 11.1* 11.4* 11.0*  RBC 2.83* 3.16* 2.64*  HGB 8.2* 9.2* 7.6*  HCT 27.0* 29.4* 24.8*  MCV 95.4 93.0 93.9  MCH 29.0 29.1 28.8  MCHC 30.4 31.3 30.6   RDW 14.2 13.6 13.6  PLT 107* 143* 164    Cardiac Enzymes Recent Labs  Lab 01/25/18 1000 01/25/18 1611 01/26/18 0114  TROPONINI 1.57* 1.49* 1.15*   No results for input(s): TROPIPOC in the last 168 hours.   BNPNo results for input(s): BNP, PROBNP in the last 168 hours.   DDimer No results for input(s): DDIMER in the last 168 hours.   Radiology    No results found.  Cardiac Studies   Echo 01/26/18: Left ventricle: The cavity size was normal. Wall thickness was   increased in a pattern of mild LVH. Systolic function was normal.   The estimated ejection fraction was in the range of 55% to 60%.   Wall motion was normal; there were no regional wall motion   abnormalities. The study was not technically sufficient to allow   evaluation of LV diastolic dysfunction due to atrial   fibrillation. - Mitral valve: There was moderate regurgitation. - Left atrium: The atrium was severely dilated. - Right ventricle: Systolic function was mildly reduced. - Atrial septum: No defect or patent foramen ovale was identified. - Tricuspid valve: There was mild regurgitation. - Systemic veins: Unable to accurately assess IVC as patient is on   ventilator.  Patient Profile     57 y.o. male with chronic atrial fibrillation, non-obstructive CAD, diabetes, hypertension, hyperlipidemia, and PAD status post recent peripheral bypass complicated by re-obstruction.  He is currently hospitalized with critical limb ischemia complicated by atrial fibrillation with rapid ventricular response.  Assessment & Plan    1. Atrial fibrillation, paroxysmal: sinus this am. Continue po amiodarone. He is currently on bivalirudin with concern for HIT. Resume Eliquis when no further surgical procedures will be needed.      2. Elevated troponin: He is known to have CAD. Mild troponin  elevation on admission. Echo 01/26/18 with normal wall motion, normal LV systolic function, RXYV=85-92%. He has no chest pain. I would  anticipate outpatient workup for ischemia unless there are clinical changes during his hospitalization  3. PAD/Critical Limb ischemia: Mr. Klarich had thrombosis of the below the knee popliteal bypass and underwent cutdown with graft thrombectomy and patch angioplasty 08/2017.  Went to the OR 12/6 for mechanical thrombectomy of the L femoropopliteal bypass with balloon angioplasty and stent placement in the L posterior tibial, L peroneal, L tibiopernoneal trunk, L common iliac and L exernal iliac artereis.  He had a compartment fasciotomy due to high risk for compartment syndrome. To the OR 01/29/18 for closure of the medial fasciotomy incision and placement of incisional vac with partial closure of the lateral fasciotomy incision and wound vac placement. VVS following.   4. Acute diastolic CHF: Large fluid resuscitation during admission. He diuresed well with IV Lasix. He is now comfortable on room air. Will give one dose of IV lasix today.   VVS service is primary. Should be ok for transfer to telemetry today from a cardiac standpoint.    For questions or updates, please contact [facility] Please consult [email or web address] for contact info under        Signed, Lauree Chandler, MD  01/30/2018, 8:00 AM

## 2018-01-31 ENCOUNTER — Telehealth: Payer: Self-pay | Admitting: Surgery

## 2018-01-31 LAB — BASIC METABOLIC PANEL
Anion gap: 10 (ref 5–15)
BUN: 8 mg/dL (ref 6–20)
CO2: 26 mmol/L (ref 22–32)
Calcium: 8.5 mg/dL — ABNORMAL LOW (ref 8.9–10.3)
Chloride: 102 mmol/L (ref 98–111)
Creatinine, Ser: 0.9 mg/dL (ref 0.61–1.24)
GFR calc Af Amer: >60 mL/min (ref >60)
GFR calc non Af Amer: >60 mL/min (ref >60)
Glucose, Bld: 151 mg/dL — ABNORMAL HIGH (ref 70–99)
Potassium: 3.5 mmol/L (ref 3.5–5.1)
Sodium: 138 mmol/L (ref 135–145)

## 2018-01-31 LAB — GLUCOSE, CAPILLARY
Glucose-Capillary: 154 mg/dL — ABNORMAL HIGH (ref 70–99)
Glucose-Capillary: 164 mg/dL — ABNORMAL HIGH (ref 70–99)
Glucose-Capillary: 240 mg/dL — ABNORMAL HIGH (ref 70–99)

## 2018-01-31 LAB — APTT: aPTT: 40 seconds — ABNORMAL HIGH (ref 24–36)

## 2018-01-31 MED ORDER — APIXABAN 5 MG PO TABS
5.0000 mg | ORAL_TABLET | Freq: Two times a day (BID) | ORAL | Status: DC
  Administered 2018-01-31: 5 mg via ORAL
  Filled 2018-01-31: qty 1

## 2018-01-31 MED ORDER — HYDROCODONE-ACETAMINOPHEN 5-325 MG PO TABS: 1.0000 | ORAL_TABLET | Freq: Four times a day (QID) | ORAL | 0 refills | Status: DC | PRN

## 2018-01-31 MED ORDER — METOPROLOL TARTRATE 25 MG PO TABS: 50.0000 mg | ORAL_TABLET | Freq: Two times a day (BID) | ORAL | 2 refills | Status: DC

## 2018-01-31 MED ORDER — CLOPIDOGREL BISULFATE 75 MG PO TABS: 75.0000 mg | ORAL_TABLET | Freq: Every day | ORAL | 3 refills | Status: DC

## 2018-01-31 MED ORDER — POTASSIUM CHLORIDE CRYS ER 20 MEQ PO TBCR
40.0000 meq | EXTENDED_RELEASE_TABLET | Freq: Once | ORAL | Status: AC
  Administered 2018-01-31: 40 meq via ORAL
  Filled 2018-01-31: qty 2

## 2018-01-31 NOTE — Care Management (Signed)
#    4.   S/W HOLLY  @ [employer] # [phone]   ELIQUIS  5 MG BID COVER- YES CO-PAY- ZERO DOLLARS PRIOR APPROVAL- NO  PREFERRED PHARMACY : YES  -  [facility]

## 2018-01-31 NOTE — Progress Notes (Signed)
[facility]  Subjective  - No acute complaints this am.  Wants to go home.  Does not want inpatient rehab or SNF.  Does not want to wait for skin graft to fasciotomy.     Objective 132/90 69 97.6 F (36.4 C) (Oral) (!) 23 100%  Intake/Output Summary (Last 24 hours) at 01/31/2018 0914 Last data filed at 01/31/2018 0839 Gross per 24 hour  Intake 854.6 ml  Output 3850 ml  Net -2995.4 ml    Brisk left PT signal, left foot warm, swelling Fasciotomy vac in place with good seal  Laboratory Lab Results: Recent Labs    01/29/18 0508 01/30/18 0415  WBC 11.4* 11.0*  HGB 9.2* 7.6*  HCT 29.4* 24.8*  PLT 143* 164   BMET Recent Labs    01/30/18 2205 01/31/18 0317  NA 137 138  K 3.4* 3.5  CL 101 102  CO2 26 26  GLUCOSE 245* 151*  BUN 9 8  CREATININE 0.91 0.90  CALCIUM 8.6* 8.5*    COAG Lab Results  Component Value Date   INR 1.72 01/29/2018   INR 1.24 01/23/2018   INR 1.13 09/17/2017   No results found for: PTT  Assessment/Planning:  Plan for discharge home today.  Good left PT signal and states foot feels good.  Will need home health PT.  Will arrange home wound vac with twice weekly changes to lateral fasciotomy site (medial fasciotomy closed, does not want skin graft on lateral fasciotomy).  Will need follow-up in several weeks for wound check.  Plan to d/c on Plavix and Eliquis.  Appreciate cardiology input.   Cephus ShellingChristopher J Sheily Lineman 01/31/2018 9:14 AM --

## 2018-01-31 NOTE — Plan of Care (Signed)
Patient alert and oriented.  Sitting up on side of bed eating dinner.  Denies any discomfort or pain at this time.  Wound vac in place, maintaining suction.  Will monitor.  Transfer orders in place, but not beds available at this time.

## 2018-01-31 NOTE — Consult Note (Signed)
WOC Nurse wound follow up Wound type: LLE fasciotomy sites  Lateral wound remains open, medial wound closed with sutures Measurement: Medial 20cm  Lateral 22cm x 6cm x 0cm  Wound bed: Medial; closed with sutures and staples Lateral: open, pink muscle tissue, swollen, sutures at the distal aspect of the lateral leg wound Drainage (amount, consistency, odor) moderate to heavy per nursing staff in the canister, serosanguinous  Periwound: edema, distal pulses intact  Dressing procedure/placement/frequency: Covered the medial incision with Mepitel, 1 strip of black foam, sealed at 100mmHG Covered the lateral wound with Mepitel, 1pc of black foam, sealed at 100MG .  Yconnector used to one NPWT device.  [facility] RN to come in for switch to Medela pump for DC to home.   Requested bedside nurse to wrap leg and protect tubing from damage and pulling for DC to home.   Mal Asher [facility] MSN,RN,CWOCN, CNS, [employer] [phone]

## 2018-01-31 NOTE — Evaluation (Signed)
Occupational Therapy Evaluation Patient Details Name: Willie Morales MRN: [id number] DOB: 1960/09/23 Today's Date: 01/31/2018    History of Present Illness 57 year old male with history of peripheral vascular disease and popliteal bypass admitted with thrombosed graft status post thrombectomy and multiple stents under went mechanical thrombectomy of the left femoropopliteal bypass graft plus balloon angioplasty with multiple stents. Patient also underwent compartment fasciotomy due to high risk for compartment syndrome.   Clinical Impression   Pt is typically independent. Presents with impulsivity and decreased safety awareness with impaired standing balance. Educated pt at length in fall prevention during ADL and IADL with pt verbalizing understanding. Pt to d/c home today, will defer further OT to [facility].    Follow Up Recommendations  Home health OT    Equipment Recommendations  Wheelchair (measurements OT);Wheelchair cushion (measurements OT)    Recommendations for Other Services       Precautions / Restrictions Precautions Precautions: Fall Precaution Comments: wound vac Restrictions Weight Bearing Restrictions: No      Mobility Bed Mobility Overal bed mobility: Modified Independent                Transfers Overall transfer level: Needs assistance Equipment used: Rolling walker (2 wheeled) Transfers: Sit to/from Stand Sit to Stand: Min guard              Balance Overall balance assessment: Needs assistance   Sitting balance-Leahy Scale: Good       Standing balance-Leahy Scale: Poor                             ADL either performed or assessed with clinical judgement   ADL Overall ADL's : Needs assistance/impaired Eating/Feeding: Independent;Sitting   Grooming: Set up;Sitting Grooming Details (indicate cue type and reason): instructed pt to sit at sink on 3 in1 for sponge bathing Upper Body Bathing: Set up;Sitting   Lower Body Bathing:  Min guard;Sit to/from stand   Upper Body Dressing : Set up;Sitting   Lower Body Dressing: Min guard;Sit to/from stand Lower Body Dressing Details (indicate cue type and reason): educated pt to have a sitting surface behing him at all times when standing during ADL Toilet Transfer: Minimal assistance;Ambulation;RW;BSC Toilet Transfer Details (indicate cue type and reason): instructed to use his comfort height toilet at home or his 3 in 1 over his standard Toileting- Clothing Manipulation and Hygiene: Min guard;Sit to/from stand       Functional mobility during ADLs: Minimal assistance;Min guard;Rolling walker General ADL Comments: pt not placing weight through heel of L foot, educated in importance of stretching heel cord, instructed in safety during meal prep from w/c and walker     Vision Patient Visual Report: No change from baseline       Perception     Praxis      Pertinent Vitals/Pain Pain Assessment: Faces Faces Pain Scale: Hurts even more Pain Location: left LE with movement Pain Descriptors / Indicators: Throbbing;Tender;Sore;Grimacing;Operative site guarding Pain Intervention(s): Monitored during session;Repositioned     Hand Dominance Right   Extremity/Trunk Assessment Upper Extremity Assessment Upper Extremity Assessment: Overall WFL for tasks assessed   Lower Extremity Assessment Lower Extremity Assessment: Defer to PT evaluation   Cervical / Trunk Assessment Cervical / Trunk Assessment: Normal   Communication Communication Communication: No difficulties   Cognition Arousal/Alertness: Awake/alert Behavior During Therapy: Impulsive Overall Cognitive Status: No family/caregiver present to determine baseline cognitive functioning  General Comments: Poor awareness of deficits/safety, but receptive to education.   General Comments       Exercises     Shoulder Instructions      Home Living Family/patient  expects to be discharged to:: Private residence Living Arrangements: Alone Available Help at Discharge: Friend(s);Available PRN/intermittently Type of Home: House Home Access: Stairs to enter Entergy CorporationEntrance Stairs-Number of Steps: 2 Entrance Stairs-Rails: Can reach both Home Layout: One level     Bathroom Shower/Tub: Tub/shower unit;Walk-in shower   Bathroom Toilet: Handicapped height     Home Equipment: Environmental consultantWalker - 2 wheels;Bedside commode   Additional Comments: Pt with a recent seperation from his wife. Reports that friends will check on him upon dc.      Prior Functioning/Environment Level of Independence: Independent        Comments: ADLs, IADLs, and driving. Pt enjoys taking care of his (140lb) saint benard "Buster".        OT Problem List: Impaired balance (sitting and/or standing);Decreased safety awareness;Decreased knowledge of use of DME or AE      OT Treatment/Interventions:      OT Goals(Current goals can be found in the care plan section) Acute Rehab OT Goals Patient Stated Goal: to go home and be indpendent  OT Frequency:     Barriers to D/C:            Co-evaluation              AM-PAC OT "6 Clicks" Daily Activity     Outcome Measure Help from another person eating meals?: None Help from another person taking care of personal grooming?: A Little Help from another person toileting, which includes using toliet, bedpan, or urinal?: A Little Help from another person bathing (including washing, rinsing, drying)?: A Little Help from another person to put on and taking off regular upper body clothing?: None Help from another person to put on and taking off regular lower body clothing?: A Little 6 Click Score: 20   End of Session Equipment Utilized During Treatment: Gait belt;Rolling walker  Activity Tolerance: Patient tolerated treatment well Patient left: in bed;with call bell/phone within reach  OT Visit Diagnosis: Unsteadiness on feet (R26.81)                 Time: [id number] OT Time Calculation (min): 21 min Charges:  OT General Charges $OT Visit: 1 Visit OT Evaluation $OT Eval Moderate Complexity: 1 Mod  Martie RoundJulie Anayiah Howden, OTR/L Acute Rehabilitation Services Pager: [phone] Office: [phone]  Evern BioMayberry, Camryn Lampson Lynn 01/31/2018, 1:32 PM

## 2018-01-31 NOTE — Progress Notes (Signed)
Discharge instructions (including medications) discussed with and copy provided to patient/caregiver 

## 2018-01-31 NOTE — Care Management Note (Signed)
Case Management Note  Patient Details  Name: Willie Morales MRN: [id number] Date of Birth: 05/31/1960  Subjective/Objective:     Skin Graft with Closed Fascoitomy               Action/Plan:  NCM spke to and offered choice for HH/CMS list provided/placed on chart. Pt states he had [facility] in the past. [facility] and wound [facility] for [facility]. Pt has a RW at home.     Expected Discharge Date:  01/31/18               Expected Discharge Plan:  Home w Home Health Services  In-House Referral:  NA  Discharge planning Services  CM Consult  Post Acute Care Choice:  Home Health Choice offered to:  Patient  DME Arranged:  Lightweight manual wheelchair with seat cushion, Negative pressure wound device DME Agency:  [facility]  [facility]  [facility]  [facility]  Status of Service:  Completed, signed off  If discussed at Long Length of Stay Meetings, dates discussed:    Additional Comments:  Elliot CousinShavis, Deddrick Saindon Ellen, RN 01/31/2018, 12:37 PM

## 2018-01-31 NOTE — Progress Notes (Signed)
Report attempted.  Instructed to call back.

## 2018-01-31 NOTE — Progress Notes (Signed)
ANTICOAGULATION CONSULT NOTE - Follow Up Consult  Pharmacy Consult for Bivalirudin - > Eliquis Indication: atrial fibrillation and s/p thrombectomy  Allergies  Allergen Reactions  . Ciprofloxacin Rash    Patient Measurements: Height: 6\' 1"  (185.4 cm) Weight: 186 lb 4.6 oz (84.5 kg) IBW/kg (Calculated) : 79.9 Heparin Dosing Weight: 79 kg  Vital Signs: Temp: 97.6 F (36.4 C) (12/13 0600) Temp Source: Oral (12/13 0600) BP: 132/90 (12/13 0600) Pulse Rate: 69 (12/13 0600)  Labs: Recent Labs    01/29/18 0508 01/30/18 0415 01/30/18 2205 01/31/18 0317  HGB 9.2* 7.6*  --   --   HCT 29.4* 24.8*  --   --   PLT 143* 164  --   --   APTT 60* 55*  --  40*  LABPROT 19.9*  --   --   --   INR 1.72  --   --   --   CREATININE 1.00 0.99 0.91 0.90    Estimated Creatinine Clearance: 104.8 mL/min (by C-G formula based on SCr of 0.9 mg/dL).  Assessment:  57 yo male previously on heparin and TPA for thrombolysis of L leg critical ischemia.  Platelet count was low, HIT unlikely, but changed from heparin to bivalirudin on 12/7, just in case.  HIT Ab negative.   S/p fasciotomy on 12/11.     To transition from Bivalirudin back to Eliquis today.  APTT subtherapeutic (40 seconds) on 0.15 mg/kg/hr Bivalirudin.  No CBC today.  No bleeding reported.  Goal of Therapy:  aPTT 50-85 seconds  Appropriate Eliquis dose for indication Monitor platelets by anticoagulation protocol: Yes   Plan:   Eliquis 5 mg PO BID to resume this morning.  Bivalirudin to stop when giving first Eliquis dose.  Noted plan for discharge on Eliquis and Plavix.  Dennie Fettersgan, Sharel Behne Donovan, RPh Pager: [phone] or phone: [phone] 01/31/2018,9:29 AM

## 2018-01-31 NOTE — Telephone Encounter (Signed)
sch appt spk to pt mld ltr 02/13/18 830am wound check

## 2018-01-31 NOTE — Telephone Encounter (Signed)
-----   Message from Emilie RutterMatthew Eveland, PA-C sent at 01/31/2018  9:51 AM EST -----  Can you schedule a follow up appt for this pt with Dr. Myra GianottiBrabham in 2weeks.  PO LLE revasc with fasciotomies and wound vac. Thanks, [employer] ChemicalMatt

## 2018-01-31 NOTE — Progress Notes (Signed)
Progress Note  Patient Name: Willie Morales Date of Encounter: 01/31/2018  Primary Cardiologist: Lorine Bears, MD   Subjective   No chest pain or dyspnea this am.   Inpatient Medications    Scheduled Meds: . amiodarone  150 mg Intravenous Once  . amiodarone  200 mg Oral BID  . apixaban  5 mg Oral BID  . aspirin  81 mg Oral Daily  . atorvastatin  80 mg Oral q1800  . cephALEXin  500 mg Oral Q8H  . Chlorhexidine Gluconate Cloth  6 each Topical Daily  . clopidogrel  75 mg Oral Daily  . docusate sodium  100 mg Oral Daily  . gabapentin  300 mg Oral TID  . insulin aspart  0-15 Units Subcutaneous Q4H  . insulin glargine  15 Units Subcutaneous Daily  . ipratropium-albuterol  3 mL Nebulization TID  . levothyroxine  25 mcg Oral QAC breakfast  . metoprolol tartrate  25 mg Oral BID  . multivitamin with minerals  1 tablet Oral Daily  . pantoprazole  40 mg Oral Daily  . risperiDONE  1 mg Oral Daily  . sodium chloride flush  10-40 mL Intracatheter Q12H   Continuous Infusions: . sodium chloride    . amiodarone Stopped (01/29/18 1015)  . bivalirudin (ANGIOMAX) infusion 0.5 mg/mL (Non-ACS indications) 0.15 mg/kg/hr (01/31/18 0600)  . magnesium sulfate 1 - 4 g bolus IVPB    . sodium chloride     PRN Meds: sodium chloride, acetaminophen, alum & mag hydroxide-simeth, fentaNYL (SUBLIMAZE) injection, guaiFENesin-dextromethorphan, haloperidol lactate, hydrALAZINE, HYDROcodone-acetaminophen, labetalol, magnesium sulfate 1 - 4 g bolus IVPB, metoCLOPramide (REGLAN) injection, metoprolol tartrate, morphine injection, ondansetron (ZOFRAN) IV, phenol, sodium chloride flush, sodium chloride flush   Vital Signs    Vitals:   01/31/18 0300 01/31/18 0400 01/31/18 0500 01/31/18 0600  BP: (!) 144/79 127/79 112/90 132/90  Pulse: (!) 101 77 (!) 48 69  Resp: 17 20 18  (!) 23  Temp:    97.6 F (36.4 C)  TempSrc:    Oral  SpO2: 100% 98% 99% 100%  Weight:      Height:        Intake/Output Summary  (Last 24 hours) at 01/31/2018 0843 Last data filed at 01/31/2018 0839 Gross per 24 hour  Intake 878.2 ml  Output 3850 ml  Net -2971.8 ml   Filed Weights   01/23/18 1738 01/26/18 0402 01/27/18 0400  Weight: 78.8 kg 84.5 kg 84.5 kg    Telemetry    Atrial fibrillation, rate 110s - Personally Reviewed  ECG    No AM EKG   Physical Exam   General: Well developed, well nourished, NAD  HEENT: OP clear, mucus membranes moist  SKIN: warm, dry. No rashes. Neuro: No focal deficits  Musculoskeletal: Muscle strength 5/5 all ext  Psychiatric: Mood and affect normal  Neck: No JVD, no carotid bruits, no thyromegaly, no lymphadenopathy.  Lungs:Clear bilaterally, no wheezes, rhonci, crackles Cardiovascular: Irreg irreg. No murmurs, gallops or rubs. Abdomen:Soft. Bowel sounds present. Non-tender.  Extremities: No lower extremity edema. Wound vacs left leg.    Labs    Chemistry Recent Labs  Lab 01/25/18 0350  01/30/18 0415 01/30/18 2205 01/31/18 0317  NA 140   < > 137 137 138  K 3.6   < > 3.6 3.4* 3.5  CL 108   < > 102 101 102  CO2 16*   < > 24 26 26   GLUCOSE 283*   < > 125* 245* 151*  BUN 13   < >  7 9 8   CREATININE 1.29*   < > 0.99 0.91 0.90  CALCIUM 8.8*   < > 8.2* 8.6* 8.5*  PROT 5.8*  --   --   --   --   ALBUMIN 3.8  --   --   --   --   AST 218*  --   --   --   --   ALT 60*  --   --   --   --   ALKPHOS 37*  --   --   --   --   BILITOT 1.9*  --   --   --   --   GFRNONAA >60   < > >60 >60 >60  GFRAA >60   < > >60 >60 >60  ANIONGAP 16*   < > 11 10 10    < > = values in this interval not displayed.     Hematology Recent Labs  Lab 01/28/18 0431 01/29/18 0508 01/30/18 0415  WBC 11.1* 11.4* 11.0*  RBC 2.83* 3.16* 2.64*  HGB 8.2* 9.2* 7.6*  HCT 27.0* 29.4* 24.8*  MCV 95.4 93.0 93.9  MCH 29.0 29.1 28.8  MCHC 30.4 31.3 30.6  RDW 14.2 13.6 13.6  PLT 107* 143* 164    Cardiac Enzymes Recent Labs  Lab 01/25/18 1000 01/25/18 1611 01/26/18 0114  TROPONINI 1.57*  1.49* 1.15*   No results for input(s): TROPIPOC in the last 168 hours.   BNPNo results for input(s): BNP, PROBNP in the last 168 hours.   DDimer No results for input(s): DDIMER in the last 168 hours.   Radiology    No results found.  Cardiac Studies   Echo 01/26/18: Left ventricle: The cavity size was normal. Wall thickness was   increased in a pattern of mild LVH. Systolic function was normal.   The estimated ejection fraction was in the range of 55% to 60%.   Wall motion was normal; there were no regional wall motion   abnormalities. The study was not technically sufficient to allow   evaluation of LV diastolic dysfunction due to atrial   fibrillation. - Mitral valve: There was moderate regurgitation. - Left atrium: The atrium was severely dilated. - Right ventricle: Systolic function was mildly reduced. - Atrial septum: No defect or patent foramen ovale was identified. - Tricuspid valve: There was mild regurgitation. - Systemic veins: Unable to accurately assess IVC as patient is on   ventilator.  Patient Profile     57 y.o. male with chronic atrial fibrillation, non-obstructive CAD, diabetes, hypertension, hyperlipidemia, and PAD status post recent peripheral bypass complicated by re-obstruction.  He is currently hospitalized with critical limb ischemia complicated by atrial fibrillation with rapid ventricular response.  Assessment & Plan    1. Atrial fibrillation, paroxysmal: He is back in atrial fibrillation. Rate reasonably controlled. He has no palpitations. Will continue amiodarone 200 mg po BID and will increase metoprolol to 50 mg po BID. He is back on Eliquis.     2. Elevated troponin: He is known to have CAD. Mild troponin elevation on admission. Echo 01/26/18 with normal wall motion, normal LV systolic function, LVEF=55-60%. No plans for ischemic workup at this time.   3. PAD/Critical Limb ischemia: Per VVS  4. Acute diastolic CHF: Volume status ok after diuresis.  No further diuresis needed.   5. NSVT: several short runs of VT overnight. Replace potassium this am. Continue amiodarone and beta blocker.   For questions or updates, please contact [facility] Please consult  [email or web address] for contact info under      Signed, Verne Carrow, MD  01/31/2018, 8:43 AM

## 2018-01-31 NOTE — Discharge Instructions (Signed)
° °[facility] ° °Discharge Instructions ° °Lower Extremity Angiogram; Angioplasty/Stenting ° °Please refer to the following instructions for your post-procedure care. Your surgeon or physician assistant will discuss any changes with you. ° °Activity ° °Avoid lifting more than 8 pounds (1 gallons of milk) for 72 hours (3 days) after your procedure. You may walk as much as you can tolerate. It's OK to drive after 72 hours. ° °Bathing/Showering ° °You may shower the day after your procedure. If you have a bandage, you may remove it at 24- 48 hours. Clean your incision site with mild soap and water. Pat the area dry with a clean towel. ° °Diet ° °Resume your pre-procedure diet. There are no special food restrictions following this procedure. All patients with peripheral vascular disease should follow a low fat/low cholesterol diet. In order to heal from your surgery, it is CRITICAL to get adequate nutrition. Your body requires vitamins, minerals, and protein. Vegetables are the best source of vitamins and minerals. Vegetables also provide the perfect balance of protein. Processed food has little nutritional value, so try to avoid this. ° °Medications ° °Resume taking all of your medications unless your doctor tells you not to. If your incision is causing pain, you may take over-the-counter pain relievers such as acetaminophen (Tylenol) ° °Follow Up ° °Follow up will be arranged at the time of your procedure. You may have an office visit scheduled or may be scheduled for surgery. Ask your surgeon if you have any questions. ° °Please call us immediately for any of the following conditions: °Severe or worsening pain your legs or feet at rest or with walking. °Increased pain, redness, drainage at your groin puncture site. °Fever of 101 degrees or higher. °If you have any mild or slow bleeding from your puncture site: lie down, apply firm constant pressure over the area with a piece of gauze  or a clean wash cloth for 30 minutes- no peeking!, call 911 right away if you are still bleeding after 30 minutes, or if the bleeding is heavy and unmanageable. ° °Reduce your risk factors of vascular disease: ° °Stop smoking. If you would like help call [facility] at 1-800-QUIT-NOW (1-800-784-8669) or [place] at 336-586-4000. °Manage your cholesterol °Maintain a desired weight °Control your diabetes °Keep your blood pressure down ° °If you have any questions, please call the office at [phone] ° ° ° °Information on my medicine - ELIQUIS® (apixaban) ° °Why was Eliquis® prescribed for you? °Eliquis® was prescribed for you to reduce the risk of a blood clot forming that can cause a stroke if you have a medical condition called atrial fibrillation (a type of irregular heartbeat). ° °What do You need to know about Eliquis® ? °Take your Eliquis® TWICE DAILY - one tablet in the morning and one tablet in the evening with or without food. If you have difficulty swallowing the tablet whole please discuss with your pharmacist how to take the medication safely. ° °Take Eliquis® exactly as prescribed by your doctor and DO NOT stop taking Eliquis® without talking to the doctor who prescribed the medication.  Stopping may increase your risk of developing a stroke.  Refill your prescription before you run out. ° °After discharge, you should have regular check-up appointments with your healthcare provider that is prescribing your Eliquis®.  In the future your dose may need to be changed if your kidney function or weight changes by a significant amount or as you get older. ° °What do   you do if you miss a dose? °If you miss a dose, take it as soon as you remember on the same day and resume taking twice daily.  Do not take more than one dose of ELIQUIS at the same time to make up a missed dose. ° °Important Safety Information °A possible side effect of Eliquis® is bleeding. You should call your healthcare provider right away  if you experience any of the following: °Bleeding from an injury or your nose that does not stop. °Unusual colored urine (red or dark brown) or unusual colored stools (red or black). °Unusual bruising for unknown reasons. °A serious fall or if you hit your head (even if there is no bleeding). ° °Some medicines may interact with Eliquis® and might increase your risk of bleeding or clotting while on Eliquis®. To help avoid this, consult your healthcare provider or pharmacist prior to using any new prescription or non-prescription medications, including herbals, vitamins, non-steroidal anti-inflammatory drugs (NSAIDs) and supplements. ° °This website has more information on Eliquis® (apixaban): http://www.eliquis.com/eliquis/home ° °

## 2018-01-31 NOTE — Progress Notes (Signed)
Patient suffers from compartment fasciotomy which impairs their ability to perform daily activities like mobilize safely in the home.  A walker alone will not resolve the issues with performing activities of daily living. A wheelchair will allow patient to safely perform daily activities.  The patient can self propel in the home or has a caregiver who can provide assistance.     Laurina Bustlearoline Orie Cuttino, PT, DPT Acute Rehabilitation Services Pager [phone] Office [phone]

## 2018-01-31 NOTE — Discharge Summary (Signed)
Physician Discharge Summary   Patient ID: Willie Morales [id number] 57 y.o. 06/13/1960  Admit date: 01/23/2018  Discharge date and time: 01/31/18   Admitting Physician: Cephus Shellinghristopher J Clark, MD   Discharge Physician: same  Admission Diagnoses: PAD (peripheral artery disease) (HCC) [I73.9] Ischemia of left lower extremity [I99.8] Limb ischemia [I99.8]  Paroxysmal atrial fibrillation  Discharge Diagnoses: PAD Paroxysmal atrial fibrillation  Admission Condition: poor  Discharged Condition: fair  Indication for Admission: ischemic left lower extremity  Hospital Course: Mr. Willie Morales is a 57 year old male who presented to the emergency department with a 3-day history of ischemic symptoms of left lower extremity.  He was found to have an occluded left femoral to popliteal PTFE bypass graft.  He was taken emergently to the operating room with Dr. Chestine Sporelark for aortogram and placement of a thrombolytic catheter with TPA infusion overnight on 01/23/2018.  Postoperatively he was transferred to the ICU as per protocol.  On 01/24/2018 he was brought back to the operating room for recheck lysis with mechanical thrombectomy of left femoropopliteal bypass graft, TP trunk, PTA with stenting of left common and external iliac, TP trunk, PTA, and peroneal artery and 4 compartment fasciotomy by Dr. Myra GianottiBrabham.  He was transferred back to the ICU intubated.  Critical care service was consulted for management of mechanical ventilation.  Postoperatively he was hypotensive due to his atrial fibrillation with rapid ventricular response and thus cardiology was also consulted for management.  He was rate and rhythm controlled with IV medication per cardiology.  He was eventually extubated on 01/27/2018.  He was also eventually transitioned to p.o. medication for control of atrial fibrillation with RVR.  He was eventually brought back to the operating room by Dr. Myra GianottiBrabham on 01/29/2018 for closure of medial fasciotomy incision and  partial closure of lateral fasciotomy incision with reapplication of wound VAC and transferred to the stepdown unit.  At time of discharge recommendations by cardiology include continuing amiodarone 4 200 mg twice daily and increasing metoprolol to 50 mg twice daily.  It should also be noted that postoperatively after revascularization he was maintained on Angiomax which was dosed by pharmacy.  At the time of discharge he was transitioned back to his regular home dose of Eliquis.  Case management also consulted for arranging home health RN for wound VAC changes as well as home health PT to work on increasing strength and mobility.  We will likely discontinue medial incisional VAC at first home health visit and will continue lateral fasciotomy incision wound VAC 2-3 times weekly.  In addition to his Eliquis he would be prescribed Plavix to take for at least 3 months.  He will also be prescribed 2 to 3 days of narcotic pain medication for continued postoperative pain control.  He will follow-up for reevaluation of circulation as well as wound check with Dr. Myra GianottiBrabham in 2 weeks.  Discharge instructions were reviewed with the patient and he voices his understanding.  If home health RN and PT and home VAC are all arranged today he will be discharged this afternoon in stable condition.  Consults: cardiology and pulmonary/intensive care  Treatments: 1.  Aortogram with thrombolytics catheter placement of left lower extremity by Dr. Chestine Sporelark on 01/23/2018  2.  Recheck lysis with mechanical thrombectomy of left femoropopliteal bypass graft, TP trunk, PTA with stenting of left common and external iliac, TP trunk, PTA, and peroneal artery and 4 compartment fasciotomy by Dr. Myra GianottiBrabham on 01/24/2018  3.  Closure of medial fasciotomy incision and partial  closure of lateral fasciotomy incisions by Dr. Myra Gianotti on 01/29/2018  Discharge Exam: See progress note 01/31/2018 Vitals:   01/31/18 0500 01/31/18 0600  BP: 112/90 132/90   Pulse: (!) 48 69  Resp: 18 (!) 23  Temp:  97.6 F (36.4 C)  SpO2: 99% 100%    Disposition: Discharge disposition: 01-Home or Self Care     Patient Instructions:  Allergies as of 01/31/2018      Reactions   Ciprofloxacin Rash      Medication List    TAKE these medications   apixaban 5 MG Tabs tablet Commonly known as:  ELIQUIS Take 1 tablet (5 mg total) by mouth 2 (two) times daily.   atorvastatin 10 MG tablet Commonly known as:  LIPITOR Take 10 mg by mouth daily.   benazepril 10 MG tablet Commonly known as:  LOTENSIN Take 1 tablet (10 mg total) by mouth daily.   buPROPion 200 MG 12 hr tablet Commonly known as:  WELLBUTRIN SR Take 200 mg by mouth every morning.   clopidogrel 75 MG tablet Commonly known as:  PLAVIX Take 1 tablet (75 mg total) by mouth daily.   gabapentin 300 MG capsule Commonly known as:  NEURONTIN Take 1 capsule (300 mg total) by mouth 3 (three) times daily.   glimepiride 4 MG tablet Commonly known as:  AMARYL Take 1 tablet (4 mg total) by mouth daily with breakfast.   HYDROcodone-acetaminophen 5-325 MG tablet Commonly known as:  NORCO/VICODIN Take 1 tablet by mouth every 6 (six) hours as needed for moderate pain. What changed:  Another medication with the same name was added. Make sure you understand how and when to take each.   HYDROcodone-acetaminophen 5-325 MG tablet Commonly known as:  NORCO/VICODIN Take 1 tablet by mouth every 6 (six) hours as needed for severe pain. What changed:  You were already taking a medication with the same name, and this prescription was added. Make sure you understand how and when to take each.   levothyroxine 50 MCG tablet Commonly known as:  SYNTHROID, LEVOTHROID Take 50 mcg by mouth daily before breakfast.   metoprolol tartrate 25 MG tablet Commonly known as:  LOPRESSOR Take 2 tablets (50 mg total) by mouth 2 (two) times daily. What changed:  how much to take   sertraline 50 MG tablet Commonly  known as:  ZOLOFT Take 50 mg by mouth at bedtime.   sitaGLIPtin-metformin 50-1000 MG tablet Commonly known as:  JANUMET Take 1 tablet by mouth 2 (two) times daily.   varenicline 0.5 MG tablet Commonly known as:  CHANTIX Take 0.5 mg by mouth daily for the first three days, then Take 0.5 mg by mouth twice a day for the next four days.   varenicline 1 MG tablet Commonly known as:  CHANTIX CONTINUING MONTH PAK Take 1 tablet (1 mg total) by mouth 2 (two) times daily.      Activity: activity as tolerated Diet: regular diet Wound Care: Okay to discontinue wound VAC on medial fasciotomy incision in the next few days; lateral fasciotomy incision will require VAC changes 2-3 times weekly  Follow-up with Dr. Myra Gianotti in 2 weeks.  SignedEmilie Rutter 01/31/2018 9:55 AM

## 2018-01-31 NOTE — Progress Notes (Signed)
Physical Therapy Treatment Patient Details Name: Willie Morales MRN: [id number] DOB: 09/20/1960 Today's Date: 01/31/2018    History of Present Illness 57 year old male with history of peripheral vascular disease and popliteal bypass admitted with thrombosed graft status post thrombectomy and multiple stents under went mechanical thrombectomy of the left femoropopliteal bypass graft plus balloon angioplasty with multiple stents. Patient also underwent compartment fasciotomy due to high risk for compartment syndrome.    PT Comments    Session focused on education on wheelchair parts and management prior to pt discharge. Pt and pt caregiver (friend) educated on locking wheelchair prior to transfers, placement/removal of legrests, transportation of wheelchair, and elevating/lowering legrests. Used Customer service managerallen wrench to length legrests to accommodate pt height. Pt and caregiver with no further questions/concerns.    Follow Up Recommendations  Home health PT;Supervision for mobility/OOB     Equipment Recommendations  Wheelchair (measurements PT)    Recommendations for Other Services       Precautions / Restrictions Precautions Precautions: Fall Precaution Comments: wound vac Restrictions Weight Bearing Restrictions: No    Mobility  Bed Mobility                 Transfers          Ambulation/Gait                 Stairs             Wheelchair Mobility    Modified Rankin (Stroke Patients Only)       Balance                               Cognition Arousal/Alertness: Awake/alert Behavior During Therapy: Impulsive Overall Cognitive Status: No family/caregiver present to determine baseline cognitive functioning                                 General Comments: Poor awareness of deficits/safety, but receptive to education.      Exercises      General Comments        Pertinent Vitals/Pain Pain Assessment: Faces Faces  Pain Scale: Hurts little more Pain Location: left LE with movement Pain Descriptors / Indicators: Throbbing;Tender;Sore;Grimacing;Operative site guarding Pain Intervention(s): Monitored during session    Home Living Family/patient expects to be discharged to:: Private residence Living Arrangements: Alone Available Help at Discharge: Friend(s);Available PRN/intermittently Type of Home: House Home Access: Stairs to enter Entrance Stairs-Rails: Can reach both Home Layout: One level Home Equipment: Walker - 2 wheels;Bedside commode Additional Comments: Pt with a recent seperation from his wife. Reports that friends will check on him upon dc.    Prior Function Level of Independence: Independent      Comments: ADLs, IADLs, and driving. Pt enjoys taking care of his (140lb) [place] "Buster".   PT Goals (current goals can now be found in the care plan section) Acute Rehab PT Goals Patient Stated Goal: to go home and be indpendent Potential to Achieve Goals: Good    Frequency    Min 3X/week      PT Plan Equipment recommendations need to be updated;Discharge plan needs to be updated    Co-evaluation              AM-PAC PT "6 Clicks" Mobility   Outcome Measure  Help needed turning from your back to your side while in a flat bed without using bedrails?:  A Little Help needed moving from lying on your back to sitting on the side of a flat bed without using bedrails?: A Little Help needed moving to and from a bed to a chair (including a wheelchair)?: A Little Help needed standing up from a chair using your arms (e.g., wheelchair or bedside chair)?: A Lot Help needed to walk in hospital room?: A Lot Help needed climbing 3-5 steps with a railing? : A Lot 6 Click Score: 15    End of Session     Patient left: in bed;with family/visitor present   PT Visit Diagnosis: Difficulty in walking, not elsewhere classified (R26.2);Pain Pain - Right/Left: Left Pain - part of body:  Leg     Time: 1610-9604 PT Time Calculation (min) (ACUTE ONLY): 20 min  Charges:  $Self Care/Home Management: 8-22                     Willie Morales, PT, DPT Acute Rehabilitation Services Pager [phone] Office [phone]    Willie Morales 01/31/2018, 4:11 PM

## 2018-02-13 ENCOUNTER — Other Ambulatory Visit: Payer: Self-pay

## 2018-02-13 ENCOUNTER — Encounter: Payer: Self-pay | Admitting: Surgery

## 2018-02-13 ENCOUNTER — Ambulatory Visit (INDEPENDENT_AMBULATORY_CARE_PROVIDER_SITE_OTHER): Payer: BLUE CROSS/BLUE SHIELD | Admitting: Surgery

## 2018-02-13 VITALS — BP 127/68 | HR 100 | Temp 98.8°F | Ht 73.0 in | Wt 186.0 lb

## 2018-02-13 DIAGNOSIS — I70229 Atherosclerosis of native arteries of extremities with rest pain, unspecified extremity: Secondary | ICD-10-CM

## 2018-02-13 NOTE — Progress Notes (Signed)
Patient name: Willie CarmelGeorge A Yaden MRN: [id number] DOB: 11/07/1960 Sex: male  REASON FOR VISIT:    Postop  HISTORY OF PRESENT ILLNESS:   Willie Morales is a 57 y.o. male who presented to the hospital on 01/23/2018 with a 3-day history of left foot pain.  He has a history of left femoral to below-knee popliteal artery bypass graft with PTFE by Dr. Johny Drillinghan in March 2019.  This occluded early and he went back to the operating room with Dr. Edilia Boickson who performed thrombectomy as well as patch angioplasty of the popliteal and tibioperoneal trunk and July 2019.  He is on Eliquis and continues to smoke.  The same day he had a lysis catheter placed.  On December 6, I performed mechanical thrombectomy of the left leg including balloon angioplasty of the popliteal, tibioperoneal trunk, posterior tibial, and peroneal artery and ultimate stenting of the left posterior tibial, peroneal and tibioperoneal trunk, as well as stenting of the left common and external iliac artery.  Simultaneously a 4 compartment fasciotomy was performed.  He went back to the operating room several days later I was able to close the medial compartment of his fasciotomy incision but the lateral compartment was unable to be closed.  Patient was not interested in skin grafting and so he was ultimately discharged home with a wound VAC.  He is here today for follow-up.  CURRENT MEDICATIONS:    Current Outpatient Medications  Medication Sig Dispense Refill  . apixaban (ELIQUIS) 5 MG TABS tablet Take 1 tablet (5 mg total) by mouth 2 (two) times daily. 60 tablet 6  . atorvastatin (LIPITOR) 10 MG tablet Take 10 mg by mouth daily.   1  . benazepril (LOTENSIN) 10 MG tablet Take 1 tablet (10 mg total) by mouth daily. 30 tablet 6  . buPROPion (WELLBUTRIN SR) 200 MG 12 hr tablet Take 200 mg by mouth every morning.  4  . clopidogrel (PLAVIX) 75 MG tablet Take 1 tablet (75 mg total) by mouth daily. 90 tablet 3  . gabapentin  (NEURONTIN) 300 MG capsule Take 1 capsule (300 mg total) by mouth 3 (three) times daily. 90 capsule 11  . glimepiride (AMARYL) 4 MG tablet Take 1 tablet (4 mg total) by mouth daily with breakfast. 30 tablet 6  . HYDROcodone-acetaminophen (NORCO/VICODIN) 5-325 MG tablet Take 1 tablet by mouth every 6 (six) hours as needed for moderate pain. 20 tablet 0  . HYDROcodone-acetaminophen (NORCO/VICODIN) 5-325 MG tablet Take 1 tablet by mouth every 6 (six) hours as needed for severe pain. 15 tablet 0  . levothyroxine (SYNTHROID, LEVOTHROID) 50 MCG tablet Take 50 mcg by mouth daily before breakfast.     . metoprolol tartrate (LOPRESSOR) 25 MG tablet Take 2 tablets (50 mg total) by mouth 2 (two) times daily. 180 tablet 2  . sertraline (ZOLOFT) 50 MG tablet Take 50 mg by mouth at bedtime.  0  . sitaGLIPtin-metformin (JANUMET) 50-1000 MG tablet Take 1 tablet by mouth 2 (two) times daily.    . varenicline (CHANTIX CONTINUING MONTH PAK) 1 MG tablet Take 1 tablet (1 mg total) by mouth 2 (two) times daily. 60 tablet 2  . varenicline (CHANTIX) 0.5 MG tablet Take 0.5 mg by mouth daily for the first three days, then Take 0.5 mg by mouth twice a day for the next four days. 11 tablet 0   No current facility-administered medications for this visit.     REVIEW OF SYSTEMS:   [X]  denotes positive finding, [ ]   denotes negative finding Cardiac  Comments:  Chest pain or chest pressure:    Shortness of breath upon exertion:    Short of breath when lying flat:    Irregular heart rhythm:    Constitutional    Fever or chills:      PHYSICAL EXAM:   Vitals:   02/13/18 0829  BP: 127/68  Pulse: 100  Temp: 98.8 F (37.1 C)  SpO2: 96%  Weight: 186 lb (84.4 kg)  Height: 6\' 1"  (1.854 m)    GENERAL: The patient is a well-nourished male, in no acute distress. The vital signs are documented above. CARDIOVASCULAR: There is a regular rate and rhythm. PULMONARY: Non-labored respirations The left foot is warm and of  normal color.  Motor function is decreased but that is his baseline. Brisk posterior tibial and peroneal Doppler signals Wound VAC is in place and functioning properly Medial fasciotomy incision is healing nicely  STUDIES:   None   MEDICAL ISSUES:   Patient will return in 2 weeks to have the staples and sutures removed from the medial fasciotomy, as well as follow-up evaluation of the lateral fasciotomy wound which we have decided to treat with wound VAC until it closes.  He will need a ultrasound and ABIs of his bypass in 3 months.  Durene CalWells Brabham, MD [facility] Tel [phone] Pager [phone]

## 2018-03-03 ENCOUNTER — Ambulatory Visit (INDEPENDENT_AMBULATORY_CARE_PROVIDER_SITE_OTHER): Payer: Self-pay | Admitting: Physician Assistant

## 2018-03-03 ENCOUNTER — Telehealth: Payer: Self-pay | Admitting: Physician Assistant

## 2018-03-03 ENCOUNTER — Other Ambulatory Visit: Payer: Self-pay

## 2018-03-03 VITALS — BP 125/78 | HR 91 | Temp 97.0°F | Resp 20 | Ht 73.0 in | Wt 186.0 lb

## 2018-03-03 DIAGNOSIS — I70229 Atherosclerosis of native arteries of extremities with rest pain, unspecified extremity: Secondary | ICD-10-CM

## 2018-03-03 NOTE — Progress Notes (Signed)
POST OPERATIVE OFFICE NOTE    CC:  F/u for surgery  HPI:  This is a 58 y.o. male who is s/p who presented to the hospital on 01/23/2018 with a 3-day history of left foot pain.  He has a history of left femoral to below-knee popliteal artery bypass graft with PTFE by Dr. Johny Drilling in March 2019.  This occluded early and he went back to the operating room with Dr. Edilia Bo who performed thrombectomy as well as patch angioplasty of the popliteal and tibioperoneal trunk and July 2019.  He is on Eliquis and continues to smoke.  The same day he had a lysis catheter placed.  On December 6, I performed mechanical thrombectomy of the left leg including balloon angioplasty of the popliteal, tibioperoneal trunk, posterior tibial, and peroneal artery and ultimate stenting of the left posterior tibial, peroneal and tibioperoneal trunk, as well as stenting of the left common and external iliac artery.  Simultaneously a 4 compartment fasciotomy was performed.  He went back to the operating room several days later I was able to close the medial compartment of his fasciotomy incision but the lateral compartment was unable to be closed.  Patient was not interested in skin grafting and so he was ultimately discharged home with a wound VAC.   At his last visit with Dr. Myra Gianotti, he had brisk PT and peroneal doppler signals and his medial fasciotomy incision was healing nicely.  He was scheduled to return in 2 weeks to have the staples and sutures removed from the medial fasciotomy as well as f/u evaluation of the lateral fasciotomy wound that is being treated with wound vac until it closes.  He will also need ABI's and duplex of his bypass in 3 months (March).   He returns today for wound check and have his staples removed.  He states that he is doing well.  He is still in a wheelchair.  He states that PT worked with him once and has not been back.  He would like to get back to walking.  He is down to one cigarette per week.  He  states he has some swelling in his left foot.  Allergies  Allergen Reactions  . Ciprofloxacin Rash    Current Outpatient Medications  Medication Sig Dispense Refill  . apixaban (ELIQUIS) 5 MG TABS tablet Take 1 tablet (5 mg total) by mouth 2 (two) times daily. 60 tablet 6  . atorvastatin (LIPITOR) 10 MG tablet Take 10 mg by mouth daily.   1  . benazepril (LOTENSIN) 10 MG tablet Take 1 tablet (10 mg total) by mouth daily. 30 tablet 6  . buPROPion (WELLBUTRIN SR) 200 MG 12 hr tablet Take 200 mg by mouth every morning.  4  . clopidogrel (PLAVIX) 75 MG tablet Take 1 tablet (75 mg total) by mouth daily. 90 tablet 3  . gabapentin (NEURONTIN) 300 MG capsule Take 1 capsule (300 mg total) by mouth 3 (three) times daily. 90 capsule 11  . glimepiride (AMARYL) 4 MG tablet Take 1 tablet (4 mg total) by mouth daily with breakfast. 30 tablet 6  . HYDROcodone-acetaminophen (NORCO/VICODIN) 5-325 MG tablet Take 1 tablet by mouth every 6 (six) hours as needed for moderate pain. 20 tablet 0  . HYDROcodone-acetaminophen (NORCO/VICODIN) 5-325 MG tablet Take 1 tablet by mouth every 6 (six) hours as needed for severe pain. 15 tablet 0  . levothyroxine (SYNTHROID, LEVOTHROID) 50 MCG tablet Take 50 mcg by mouth daily before breakfast.     .  metoprolol tartrate (LOPRESSOR) 25 MG tablet Take 2 tablets (50 mg total) by mouth 2 (two) times daily. 180 tablet 2  . sertraline (ZOLOFT) 50 MG tablet Take 50 mg by mouth at bedtime.  0  . sitaGLIPtin-metformin (JANUMET) 50-1000 MG tablet Take 1 tablet by mouth 2 (two) times daily.    . varenicline (CHANTIX CONTINUING MONTH PAK) 1 MG tablet Take 1 tablet (1 mg total) by mouth 2 (two) times daily. 60 tablet 2  . varenicline (CHANTIX) 0.5 MG tablet Take 0.5 mg by mouth daily for the first three days, then Take 0.5 mg by mouth twice a day for the next four days. 11 tablet 0   No current facility-administered medications for this visit.      ROS:  See HPI  Physical  Exam:  Today's Vitals   03/03/18 1413  BP: 125/78  Pulse: 91  Resp: 20  Temp: (!) 97 F (36.1 C)  SpO2: 100%  Weight: 186 lb (84.4 kg)  Height: 6\' 1"  (1.854 m)   Body mass index is 24.54 kg/m.  Incision:  Medial fasciotomy incision has healed nicely and has staples and sutures in tact.  Lateral fasciotomy is healing nicely and measures 10cm x 3.5cm and is superficial.    Extremities:  Brisk left PT doppler signal; sensation and motor are in tact left foot.   Assessment/Plan:  This is a 58 y.o. male who is s/p: 1.  Ultrasound-guided access of the right common femoral artery 2.  Aortogram 3.  Left lower extremity arteriogram with selection of third order branches 4.  Thrombolytics catheter placement in the left common femoral to below-knee popliteal prosthetic bypass graft 5.  Pharmacologic catheter thrombolysis of the left lower extremity (Unifuse catheter) 01/23/18 Chestine Spore) and  #1: Catheter exchange for lytic therapy  #2: Left lower extremity angiogram 3: Mechanical thrombectomy left femoral-popliteal bypass graft, tibioperoneal trunk, posterior tibial artery using penumbra CAT 6 device #4: Balloon angioplasty left popliteal, tibioperoneal trunk, posterior tibial, peroneal artery #5: Stent, left posterior tibial artery (synergy 3 x 32) #6: Stent, left peroneal artery (synergy 3 x 24) #7: Stent, left tibioperoneal trunk #8: Stent, left common iliac artery and left external iliac artery (7 x 120 Elluvia)  #9: Intra-arterial administration of nitroglycerin #10: Right femoral angiogram with closure device (Mynx) #11: 4 compartment fasciotomy #12: Application of wound VAC to fasciotomy sites -01/24/18 (Brabham) And Left leg fasciotomy 01/29/18 (Brabham)  -pt with brisk left PT doppler signal -Sutures and staples removed today.  -will have him f/u in 3 weeks to check lateral fasciotomy site. -at next visit, will schedule 3 months f/u with ABI's and arterial duplex of LLE  bypass. -he will call sooner should he have any issues.    Doreatha Massed, PA-C [facility] [phone]  Clinic MD:  Myra Gianotti

## 2018-03-03 NOTE — Telephone Encounter (Signed)
Per Doreatha Massed, PA, I called [facility] and spoke to [facility] to add Physical therapy to his wound care orders.  Rexford Maus said that the patient's insurance has changed as of Jan 1st and is no longer in network with [facility].  She will try to refer him to another agency.  If she cannot, she will call us back. I called patient to advise him of new changes.  Patient verbalized understanding.  Ernst Spell., LPN

## 2018-03-04 ENCOUNTER — Telehealth: Payer: Self-pay | Admitting: Surgery

## 2018-03-04 NOTE — Telephone Encounter (Signed)
-----   Message from Dara Lords, [place] sent at 03/04/2018  1:27 PM EST ----- Regarding: RE: Wound Vac/ PT order Please let Dr. Myra Gianotti know as well.  When I see him, I will also mention it.   Thanks  ----- Message ----- From: Yolonda Kida, LPN Sent: 0/71/2197  12:58 PM EST To: Dara Lords, PA-C Subject: FW: Wound Vac/ PT order                        Just to update you again, I've exhausted all options around his location.  Not sure what he can do now.  Thanks, Ashleigh LPN. ----- Message ----- From: Yolonda Kida, LPN Sent: 5/88/3254  12:21 PM EST To: Dara Lords, PA-C Subject: RE: Wound Vac/ PT order                        No No one is in network (so far) in the area he lives in.  The ones that are in network dont service the [employer] area.  [facility]).  Pt says he is running into the same problem.  He is not getting Home Health at this time.  I will try [facility]. Pt will call if he finds anything. ----- Message ----- From: Dara Lords, PA-C Sent: 03/04/2018  10:33 AM EST To: Yolonda Kida, LPN Subject: RE: Wound Vac/ PT order                        Molli Knock, is [facility] in his network?   Is he getting HH until we can find a company?   samantha  ----- Message ----- From: Yolonda Kida, LPN Sent: 9/82/6415  10:08 AM EST To: Dara Lords, PA-C Subject: Wound Vac/ PT order                            Demetria Pore.  FYI: [facility] called and said patient's insurance has changed to [employer] and is no longer in network.  Says we need to find another [facility] agency so they can pick up the wound vac.  He will also need to start PT with another company.  We are working on finding him another company.  Thanks,  Ernst Spell., LPN

## 2018-03-04 NOTE — Telephone Encounter (Signed)
Per Rexford Maus, Patient Care Mgr, she spoke to patient this morning and he will try to switch back to [facility] since [employer] will not cover any of the services he needs.  Ernst Spell., LPN

## 2018-03-14 ENCOUNTER — Ambulatory Visit: Payer: BLUE CROSS/BLUE SHIELD | Admitting: Gastroenterology

## 2018-03-17 ENCOUNTER — Ambulatory Visit: Payer: PRIVATE HEALTH INSURANCE

## 2018-03-18 ENCOUNTER — Ambulatory Visit: Payer: BLUE CROSS/BLUE SHIELD | Admitting: Cardiovascular Disease

## 2018-03-19 ENCOUNTER — Other Ambulatory Visit: Payer: Self-pay

## 2018-03-19 ENCOUNTER — Ambulatory Visit (INDEPENDENT_AMBULATORY_CARE_PROVIDER_SITE_OTHER): Payer: Self-pay | Admitting: Physician Assistant

## 2018-03-19 VITALS — BP 108/69 | HR 70 | Temp 97.6°F | Resp 20 | Ht 73.0 in | Wt 177.0 lb

## 2018-03-19 DIAGNOSIS — I70229 Atherosclerosis of native arteries of extremities with rest pain, unspecified extremity: Secondary | ICD-10-CM

## 2018-03-19 NOTE — Progress Notes (Signed)
    Postoperative Visit   History of Present Illness   Willie Morales is a 58 y.o. year old male who presents for postoperative follow-up for: Left femoral to below the knee popliteal artery bypass graft with PTFE by Dr. Imogene Burn in March 2019.  He experienced acute thrombosis of this graft and went back to the operating room with Dr. Edilia Bo for thrombectomy as well as patch angioplasty of the popliteal and tibioperoneal trunk in July 2019.  He also required thrombolysis treatment by Dr. Chestine Spore 01/23/18 including balloon angioplasty of the proximal petechial, TP trunk, PTA, and peroneal artery in addition to stenting of the left common and external iliac artery.  At the same time he underwent 4 compartment fasciotomies.  He returned a couple weeks ago for suture and staple removal.  Lateral fasciotomy incision was not closed in hospital due to persistent edema.  He has continued a wound VAC on this lateral fasciotomy incision.  He continues to be on Eliquis and Plavix.  He denies rest pain or active tissue ischemia.  He states he believes the sensation is returning to his left foot and that has swelling has drastically improved.  He has since last visit discontinued the wound VAC and is performing daily and twice daily wet-to-dry dressing changes by himself.  He believes the incision is healing well.   For VQI Use Only   PRE-ADM LIVING: Home  AMB STATUS: Ambulatory with Assistance   Physical Examination   Vitals:   03/19/18 0912  BP: 108/69  Pulse: 70  Resp: 20  Temp: 97.6 F (36.4 C)  TempSrc: Oral  SpO2: 100%  Weight: 177 lb (80.3 kg)  Height: 6\' 1"  (1.854 m)    LLE: Lateral fasciotomy incision with a healthy wound bed, no surrounding erythema, no drainage or other sign of infection; granulation tissue is apparent; left PT and AT by Doppler seemingly monophasic  Medical Decision Making   Willie Morales is a 58 y.o. year old male who presents s/p left femoral to popliteal bypass  requiring several revascularization procedures most recently requiring thrombolysis, inflow stenting and outflow balloon angioplasty with 4 compartment fasciotomy  . Left lateral fasciotomy incision with a healthy wound bed, no sign of infection . Continue wet-to-dry dressing changes daily; okay to cleanse wound with soap and water daily . Encouraged continued ambulation . We will check bypass graft surveillance duplex as well as ABIs in 4 to 6 weeks . Patient will follow-up sooner if he is worried about bypass patency or wound healing   Emilie Rutter PA-C [facility] [place] Office: [phone]

## 2018-03-26 ENCOUNTER — Other Ambulatory Visit: Payer: Self-pay

## 2018-03-26 DIAGNOSIS — I70229 Atherosclerosis of native arteries of extremities with rest pain, unspecified extremity: Secondary | ICD-10-CM

## 2018-04-01 ENCOUNTER — Ambulatory Visit: Payer: PRIVATE HEALTH INSURANCE | Admitting: Cardiovascular Disease

## 2018-04-15 ENCOUNTER — Ambulatory Visit: Payer: PRIVATE HEALTH INSURANCE | Admitting: Cardiovascular Disease

## 2018-04-15 DIAGNOSIS — R0989 Other specified symptoms and signs involving the circulatory and respiratory systems: Secondary | ICD-10-CM

## 2018-04-16 ENCOUNTER — Ambulatory Visit: Payer: PRIVATE HEALTH INSURANCE

## 2018-04-16 ENCOUNTER — Ambulatory Visit (HOSPITAL_COMMUNITY)
Admission: RE | Admit: 2018-04-16 | Discharge: 2018-04-16 | Disposition: A | Discharge status: Home / Self Care | Payer: BLUE CROSS/BLUE SHIELD | Source: Ambulatory Visit | Attending: Family | Admitting: Family

## 2018-04-16 ENCOUNTER — Ambulatory Visit (INDEPENDENT_AMBULATORY_CARE_PROVIDER_SITE_OTHER): Payer: Self-pay | Admitting: Physician Assistant

## 2018-04-16 ENCOUNTER — Other Ambulatory Visit: Payer: Self-pay

## 2018-04-16 ENCOUNTER — Ambulatory Visit (INDEPENDENT_AMBULATORY_CARE_PROVIDER_SITE_OTHER)
Admission: RE | Admit: 2018-04-16 | Discharge: 2018-04-16 | Disposition: A | Discharge status: Home / Self Care | Payer: BLUE CROSS/BLUE SHIELD | Source: Ambulatory Visit | Attending: Family | Admitting: Family

## 2018-04-16 VITALS — BP 148/87 | HR 88 | Temp 98.2°F | Resp 18 | Ht 73.0 in | Wt 177.0 lb

## 2018-04-16 DIAGNOSIS — I739 Peripheral vascular disease, unspecified: Secondary | ICD-10-CM

## 2018-04-16 DIAGNOSIS — I70229 Atherosclerosis of native arteries of extremities with rest pain, unspecified extremity: Secondary | ICD-10-CM

## 2018-04-16 NOTE — Progress Notes (Signed)
    Postoperative Visit   History of Present Illness   Willie Morales is a 58 y.o. year old male who presents for postoperative follow-up for postoperative follow-up for: Left femoral to below the knee popliteal artery bypass graft with PTFE by Dr. Imogene Burn in March 2019.  He experienced acute thrombosis of this graft and went back to the operating room with Dr. Edilia Bo for thrombectomy as well as patch angioplasty of the popliteal and tibioperoneal trunk in July 2019.  He also required thrombolysis treatment by Dr. Chestine Spore 01/23/18 including balloon angioplasty of the proximal popliteal, TP trunk, PTA, and peroneal artery in addition to stenting of the left common and external iliac artery.  At the same time he underwent 4 compartment fasciotomies.  Medial fasciotomy incision was closed prior to discharge however lateral incision was left open.  Patient has been cleansing this wound daily and doing daily dressing changes.  He states that lateral fasciotomy incision is now healed.  He denies any claudication, rest pain, or active tissue ischemia.  He continues to take his Eliquis and Plavix.   For VQI Use Only   PRE-ADM LIVING: Home  AMB STATUS: Ambulatory   Physical Examination   Vitals:   04/16/18 1352  BP: (!) 148/87  Pulse: 88  Resp: 18  Temp: 98.2 F (36.8 C)  SpO2: 100%  Weight: 177 lb (80.3 kg)  Height: 6\' 1"  (1.854 m)    LLE: Incisions are healed; no tissue ischemia BLE  Graft duplex demonstrates a patent left femoral to popliteal bypass without any areas of hemodynamically significant stenosis  Right ABI 0.77 with TBI of 0.79 Left ABI of 0.83 with TBI of 0.74    Medical Decision Making   Willie Morales is a 58 y.o. year old male who presents for wound check and graft surveillance.  . Lateral fasciotomy incision now completely healed other than small areas of dry eschar . Bypass patent by duplex . Continue Eliquis and Plavix . Rechecked graft surveillance with ABIs in  another 3 months . Return to office sooner if claudication, rest pain, or tissue changes develop   Emilie Rutter PA-C [facility] [place] Office: [phone]  Clinic MD: Dr. Edilia Bo

## 2018-05-20 ENCOUNTER — Ambulatory Visit: Payer: PRIVATE HEALTH INSURANCE | Admitting: Gastroenterology

## 2018-06-18 ENCOUNTER — Other Ambulatory Visit: Payer: Self-pay

## 2018-06-18 DIAGNOSIS — I739 Peripheral vascular disease, unspecified: Secondary | ICD-10-CM

## 2018-06-27 ENCOUNTER — Telehealth: Payer: Self-pay | Admitting: Cardiovascular Disease

## 2018-06-27 NOTE — Telephone Encounter (Signed)
Virtual Visit Pre-Appointment Phone Call  "(Name), I am calling you today to discuss your upcoming appointment. We are currently trying to limit exposure to the virus that causes COVID-19 by seeing patients at home rather than in the office."  1. "What is the BEST phone number to call the day of the visit?" - include this in appointment notes  2. Do you have or have access to (through a family member/friend) a smartphone with video capability that we can use for your visit?" a. If yes - list this number in appt notes as cell (if different from BEST phone #) and list the appointment type as a VIDEO visit in appointment notes b. If no - list the appointment type as a PHONE visit in appointment notes  3. Confirm consent - "In the setting of the current Covid19 crisis, you are scheduled for a (phone or video) visit with your provider on (date) at (time).  Just as we do with many in-office visits, in order for you to participate in this visit, we must obtain consent.  If you'd like, I can send this to your mychart (if signed up) or email for you to review.  Otherwise, I can obtain your verbal consent now.  All virtual visits are billed to your insurance company just like a normal visit would be.  By agreeing to a virtual visit, we'd like you to understand that the technology does not allow for your provider to perform an examination, and thus may limit your provider's ability to fully assess your condition. If your provider identifies any concerns that need to be evaluated in person, we will make arrangements to do so.  Finally, though the technology is pretty good, we cannot assure that it will always work on either your or our end, and in the setting of a video visit, we may have to convert it to a phone-only visit.  In either situation, we cannot ensure that we have a secure connection.  Are you willing to proceed?" STAFF: Did the patient verbally acknowledge consent to telehealth visit? Document  YES/NO here: yes  4. Advise patient to be prepared - "Two hours prior to your appointment, go ahead and check your blood pressure, pulse, oxygen saturation, and your weight (if you have the equipment to check those) and write them all down. When your visit starts, your provider will ask you for this information. If you have an Apple Watch or Kardia device, please plan to have heart rate information ready on the day of your appointment. Please have a pen and paper handy nearby the day of the visit as well."  5. Give patient instructions for MyChart download to smartphone OR Doximity/Doxy.me as below if video visit (depending on what platform provider is using)  6. Inform patient they will receive a phone call 15 minutes prior to their appointment time (may be from unknown caller ID) so they should be prepared to answer    TELEPHONE CALL NOTE  Willie Morales has been deemed a candidate for a follow-up tele-health visit to limit community exposure during the Covid-19 pandemic. I spoke with the patient via phone to ensure availability of phone/video source, confirm preferred email & phone number, and discuss instructions and expectations.  I reminded Willie Morales to be prepared with any vital sign and/or heart rhythm information that could potentially be obtained via home monitoring, at the time of his visit. I reminded Willie Morales to expect a phone call prior to  his visit.  Willie Morales 06/27/2018 12:00 PM   INSTRUCTIONS FOR DOWNLOADING THE MYCHART APP TO SMARTPHONE  - The patient must first make sure to have activated MyChart and know their login information - If Apple, go to [employer] and type in MyChart in the search bar and download the app. If Android, ask patient to go to [employer] and type in [place] in the search bar and download the app. The app is free but as with any other app downloads, their phone may require them to verify saved payment information or Apple/Android  password.  - The patient will need to then log into the app with their MyChart username and password, and select [facility] as their healthcare provider to link the account. When it is time for your visit, go to the MyChart app, find appointments, and click Begin Video Visit. Be sure to Select Allow for your device to access the Microphone and Camera for your visit. You will then be connected, and your provider will be with you shortly.  **If they have any issues connecting, or need assistance please contact MyChart service desk [phone])**  **If using a computer, in order to ensure the best quality for their visit they will need to use either of the following Internet Browsers: [employer] or Google Chrome**  IF USING DOXIMITY or DOXY.ME - The patient will receive a link just prior to their visit by text.     FULL LENGTH CONSENT FOR TELE-HEALTH VISIT   I hereby voluntarily request, consent and authorize [facility] and its employed or contracted physicians, physician assistants, nurse practitioners or other licensed health care professionals (the Practitioner), to provide me with telemedicine health care services (the Services") as deemed necessary by the treating Practitioner. I acknowledge and consent to receive the Services by the Practitioner via telemedicine. I understand that the telemedicine visit will involve communicating with the Practitioner through live audiovisual communication technology and the disclosure of certain medical information by electronic transmission. I acknowledge that I have been given the opportunity to request an in-person assessment or other available alternative prior to the telemedicine visit and am voluntarily participating in the telemedicine visit.  I understand that I have the right to withhold or withdraw my consent to the use of telemedicine in the course of my care at any time, without affecting my right to future care or treatment,  and that the Practitioner or I may terminate the telemedicine visit at any time. I understand that I have the right to inspect all information obtained and/or recorded in the course of the telemedicine visit and may receive copies of available information for a reasonable fee.  I understand that some of the potential risks of receiving the Services via telemedicine include:   Delay or interruption in medical evaluation due to technological equipment failure or disruption;  Information transmitted may not be sufficient (e.g. poor resolution of images) to allow for appropriate medical decision making by the Practitioner; and/or   In rare instances, security protocols could fail, causing a breach of personal health information.  Furthermore, I acknowledge that it is my responsibility to provide information about my medical history, conditions and care that is complete and accurate to the best of my ability. I acknowledge that Practitioner's advice, recommendations, and/or decision may be based on factors not within their control, such as incomplete or inaccurate data provided by me or distortions of diagnostic images or specimens that may result from electronic transmissions. I understand  that the practice of medicine is not an exact science and that Practitioner makes no warranties or guarantees regarding treatment outcomes. I acknowledge that I will receive a copy of this consent concurrently upon execution via email to the email address I last provided but may also request a printed copy by calling the office of [place].    I understand that my insurance will be billed for this visit.   I have read or had this consent read to me.  I understand the contents of this consent, which adequately explains the benefits and risks of the Services being provided via telemedicine.   I have been provided ample opportunity to ask questions regarding this consent and the Services and have had my questions  answered to my satisfaction.  I give my informed consent for the services to be provided through the use of telemedicine in my medical care  By participating in this telemedicine visit I agree to the above.

## 2018-06-30 ENCOUNTER — Telehealth: Payer: Self-pay | Admitting: Cardiovascular Disease

## 2018-07-01 ENCOUNTER — Telehealth (INDEPENDENT_AMBULATORY_CARE_PROVIDER_SITE_OTHER): Payer: BLUE CROSS/BLUE SHIELD | Admitting: Cardiovascular Disease

## 2018-07-01 ENCOUNTER — Encounter: Payer: Self-pay | Admitting: Cardiovascular Disease

## 2018-07-01 VITALS — Ht 73.0 in | Wt 170.0 lb

## 2018-07-01 DIAGNOSIS — I482 Chronic atrial fibrillation, unspecified: Secondary | ICD-10-CM | POA: Diagnosis not present

## 2018-07-01 DIAGNOSIS — I739 Peripheral vascular disease, unspecified: Secondary | ICD-10-CM | POA: Diagnosis not present

## 2018-07-01 DIAGNOSIS — F17210 Nicotine dependence, cigarettes, uncomplicated: Secondary | ICD-10-CM | POA: Diagnosis not present

## 2018-07-01 MED ORDER — APIXABAN 5 MG PO TABS: 5.0000 mg | ORAL_TABLET | Freq: Two times a day (BID) | ORAL | 6 refills | Status: AC

## 2018-07-01 MED ORDER — ATORVASTATIN CALCIUM 40 MG PO TABS: 40.0000 mg | ORAL_TABLET | Freq: Every day | ORAL | 6 refills | Status: AC

## 2018-07-01 MED ORDER — BENAZEPRIL HCL 10 MG PO TABS: 10.0000 mg | ORAL_TABLET | Freq: Every day | ORAL | 6 refills | Status: AC

## 2018-07-01 MED ORDER — CLOPIDOGREL BISULFATE 75 MG PO TABS: 75.0000 mg | ORAL_TABLET | Freq: Every day | ORAL | 3 refills | Status: AC

## 2018-07-01 MED ORDER — METOPROLOL TARTRATE 50 MG PO TABS: 50.0000 mg | ORAL_TABLET | Freq: Two times a day (BID) | ORAL | 6 refills | Status: AC

## 2018-07-01 NOTE — Progress Notes (Signed)
Virtual Visit via Telephone Note   This visit type was conducted due to national recommendations for restrictions regarding the COVID-19 Pandemic (e.g. social distancing) in an effort to limit this patient's exposure and mitigate transmission in our community.  Due to his co-morbid illnesses, this patient is at least at moderate risk for complications without adequate follow up.  This format is felt to be most appropriate for this patient at this time.  The patient did not have access to video technology/had technical difficulties with video requiring transitioning to audio format only (telephone).  All issues noted in this document were discussed and addressed.  No physical exam could be performed with this format.  Please refer to the patient's chart for his  consent to telehealth for [facility].   Date:  07/01/2018   ID:  Willie Morales, DOB 03-Feb-1961, MRN [id number]  Patient Location: Home Provider Location: Office  PCP:  Benita Stabile, MD  Cardiologist:  Lorine Bears, MD  Electrophysiologist:  None   Evaluation Performed:  Follow-Up Visit  Chief Complaint: Not able to quit smoking  History of Present Illness:    Willie Morales is a 58 y.o. male who was reached via phone for follow-up visit regarding atrial fibrillation and mild nonobstructive coronary artery disease. He is known to have severe peripheral arterial disease followed by [facility].  He had left femoropopliteal bypass last year with subsequent thrombotic occlusion which required extensive work.  Previous remote cardiac catheterization showed mild nonobstructive coronary artery disease.  He has atrial fibrillation on long-term anticoagulation with Eliquis treated with rate control with metoprolol.   He has been doing reasonably well with no recent chest pain, worsening dyspnea or palpitations.  His left leg has healed but continues to have some numbness. He cut down on smoking to 10 cigarettes a day but has not been able to  quit completely.    The patient does not have symptoms concerning for COVID-19 infection (fever, chills, cough, or new shortness of breath).    Past Medical History:  Diagnosis Date   Carotid artery disease (HCC)    a. 60-70% RICA, totally occluded LICA, > 50% stenosed bilat ECA 04/2017.   Chronic atrial fibrillation    Coronary artery disease    a. remote cath in 2009 showing 30% mRCA, 30% PDA, 45% LAD, 30% diag.   Diabetes mellitus    Dyslipidemia    History of kidney stones    Hypertension    Tobacco abuse    Past Surgical History:  Procedure Laterality Date   ABDOMINAL AORTOGRAM W/LOWER EXTREMITY N/A 07/05/2016   Procedure: Abdominal Aortogram w/Lower Extremity;  Surgeon: Fransisco Hertz, MD;  Location: [facility] INVASIVE CV LAB;  Service: Cardiovascular;  Laterality: N/A;   ABDOMINAL AORTOGRAM W/LOWER EXTREMITY N/A 05/06/2017   Procedure: ABDOMINAL AORTOGRAM W/LOWER EXTREMITY;  Surgeon: Maeola Harman, MD;  Location: [facility] INVASIVE CV LAB;  Service: Cardiovascular;  Laterality: N/A;   APPLICATION OF WOUND VAC Left 01/29/2018   Procedure: APPLICATION OF WOUND VAC LEFT LOWER LEG MEDIAL AND LATERAL SIDES;  Surgeon: Nada Libman, MD;  Location: [facility] OR;  Service: Vascular;  Laterality: Left;   BACK SURGERY  1991   CARDIAC CATHETERIZATION  07/31/2007   This showed nonobstructive coronary artery disease   He had 30% lesion in the mid RCA, 30% lesion in the PDA, 45% lesion in the LAD and mid  LAD, and 30% lesion in the diagonal.  The left circumflex was normal.  The  LV function was 60%.      ENDARTERECTOMY FEMORAL Left 05/09/2017   Procedure: ENDARTERECTOMY FEMORAL WITH DACRON PATCH ANGIOPLASTY;  Surgeon: Fransisco Hertz, MD;  Location: [facility] OR;  Service: Vascular;  Laterality: Left;   ENDARTERECTOMY POPLITEAL  09/17/2017   Procedure: ENDARTERECTOMY POPLITEAL ARTERY AND TIBIAL FIBULAR TRUNK;  Surgeon: Chuck Hint, MD;  Location: [facility] OR;  Service: Vascular;;    FASCIOTOMY CLOSURE Left 01/24/2018   Procedure: FASCIOTOMY OF LOWER LEFT LEG;  Surgeon: Nada Libman, MD;  Location: [facility] OR;  Service: Vascular;  Laterality: Left;   FASCIOTOMY CLOSURE Left 01/29/2018   Procedure: FASCIOTOMY CLOSURE LEFT LOWER EXTREMITY;  Surgeon: Nada Libman, MD;  Location: [facility] OR;  Service: Vascular;  Laterality: Left;   FEMORAL-POPLITEAL BYPASS GRAFT Left 05/09/2017   Procedure: BYPASS GRAFT FEMORAL-POPLITEAL ARTERY LEFT;  Surgeon: Fransisco Hertz, MD;  Location: [facility] OR;  Service: Vascular;  Laterality: Left;   FEMORAL-POPLITEAL BYPASS GRAFT Left 09/17/2017   Procedure: THROMBECTOMY LEFT FEMORAL-POPLITEAL BYPASS GRAFT;  Surgeon: Chuck Hint, MD;  Location: [facility] OR;  Service: Vascular;  Laterality: Left;   KIDNEY STONE SURGERY  2011   LOWER EXTREMITY ANGIOGRAM Left 01/23/2018   Procedure: LEFT LOWER EXTREMITY ANGIOGRAM, THROMBOLYSIS Catheter Placement;  Surgeon: Cephus Shelling, MD;  Location: [facility] OR;  Service: Vascular;  Laterality: Left;   LOWER EXTREMITY ANGIOGRAPHY N/A 12/06/2016   Procedure: Lower Extremity Angiography;  Surgeon: Fransisco Hertz, MD;  Location: [facility] INVASIVE CV LAB;  Service: Cardiovascular;  Laterality: N/A;   LOWER EXTREMITY ANGIOGRAPHY N/A 09/05/2017   Procedure: LOWER EXTREMITY ANGIOGRAPHY;  Surgeon: Fransisco Hertz, MD;  Location: [facility] INVASIVE CV LAB;  Service: Cardiovascular;  Laterality: N/A;   PATCH ANGIOPLASTY Left 05/09/2017   Procedure: PATCH ANGIOPLASTY WITH PROPATEN GRAFT  OF BELOW KNEE POPLITEAL ARTERY;  Surgeon: Fransisco Hertz, MD;  Location: [facility] OR;  Service: Vascular;  Laterality: Left;   PATCH ANGIOPLASTY Left 09/17/2017   Procedure: PATCH ANGIOPLASTY WITH PERICARDIAL BOVINE PATCH OF DISTAL ANASTOMOSIS;  Surgeon: Chuck Hint, MD;  Location: [facility] OR;  Service: Vascular;  Laterality: Left;   PERCUTANEOUS VENOUS THROMBECTOMY,LYSIS WITH INTRAVASCULAR ULTRASOUND (IVUS) Left 01/24/2018   Procedure: Lysis Re-check;  Surgeon:  Nada Libman, MD;  Location: [facility] OR;  Service: Vascular;  Laterality: Left;   PERIPHERAL VASCULAR ATHERECTOMY Right 07/19/2016   Procedure: Peripheral Vascular Atherectomy;  Surgeon: Fransisco Hertz, MD;  Location: [facility] INVASIVE CV LAB;  Service: Cardiovascular;  Laterality: Right;   PERIPHERAL VASCULAR BALLOON ANGIOPLASTY Left 07/05/2016   Procedure: Peripheral Vascular Balloon Angioplasty;  Surgeon: Fransisco Hertz, MD;  Location: [facility] INVASIVE CV LAB;  Service: Cardiovascular;  Laterality: Left;  SFA   PERIPHERAL VASCULAR BALLOON ANGIOPLASTY Left 09/05/2017   Procedure: PERIPHERAL VASCULAR BALLOON ANGIOPLASTY;  Surgeon: Fransisco Hertz, MD;  Location: [facility] INVASIVE CV LAB;  Service: Cardiovascular;  Laterality: Left;  Popliteal, profunda femoral, external iliac   PERIPHERAL VASCULAR INTERVENTION  12/06/2016   Procedure: PERIPHERAL VASCULAR INTERVENTION;  Surgeon: Fransisco Hertz, MD;  Location: [facility] INVASIVE CV LAB;  Service: Cardiovascular;;  LEFT     Current Meds  Medication Sig   apixaban (ELIQUIS) 5 MG TABS tablet Take 1 tablet (5 mg total) by mouth 2 (two) times daily.   atorvastatin (LIPITOR) 40 MG tablet TAKE 1 TABLET BY MOUTH EVERY NIGHT FOR CHOLESTEROL   benazepril (LOTENSIN) 10 MG tablet Take 1 tablet (10 mg total) by mouth daily.   buPROPion (WELLBUTRIN SR) 200 MG 12 hr  tablet Take 200 mg by mouth every morning.   cephALEXin (KEFLEX) 500 MG capsule Take 500 mg by mouth 3 (three) times daily.   clopidogrel (PLAVIX) 75 MG tablet Take 1 tablet (75 mg total) by mouth daily.   gabapentin (NEURONTIN) 300 MG capsule Take 1 capsule (300 mg total) by mouth 3 (three) times daily.   glimepiride (AMARYL) 4 MG tablet Take 1 tablet (4 mg total) by mouth daily with breakfast.   HYDROcodone-acetaminophen (NORCO/VICODIN) 5-325 MG tablet Take 1 tablet by mouth every 6 (six) hours as needed for moderate pain.   levothyroxine (SYNTHROID, LEVOTHROID) 50 MCG tablet Take 50 mcg by mouth daily before  breakfast.    metoprolol tartrate (LOPRESSOR) 25 MG tablet Take 2 tablets (50 mg total) by mouth 2 (two) times daily.   sertraline (ZOLOFT) 50 MG tablet Take 50 mg by mouth at bedtime.   sitaGLIPtin-metformin (JANUMET) 50-1000 MG tablet Take 1 tablet by mouth 2 (two) times daily.     Allergies:   Ciprofloxacin   Social History   Tobacco Use   Smoking status: Current Every Day Smoker    Packs/day: 1.00    Years: 35.00    Pack years: 35.00    Types: Cigarettes   Smokeless tobacco: Never Used   Tobacco comment: 2-3 cigarettes per day  Substance Use Topics   Alcohol use: No   Drug use: No     Family Hx: The patient's family history includes Cancer in his mother; Coronary artery disease in his father; Diabetes in his maternal grandmother; Heart attack in his father; Heart disease in his brother, father, and mother; SIDS in his sister.  ROS:   Please see the history of present illness.     All other systems reviewed and are negative.   Prior CV studies:   The following studies were reviewed today:   Labs/Other Tests and Data Reviewed:    EKG:  An ECG dated December 2019 was personally reviewed today and demonstrated:  Atrial fibrillation with rapid ventricular response and repolarization abnormalities  Recent Labs: 01/24/2018: TSH 0.944 01/25/2018: ALT 60 01/30/2018: Hemoglobin 7.6; Magnesium 1.9; Platelets 164 01/31/2018: BUN 8; Creatinine, Ser 0.90; Potassium 3.5; Sodium 138   Recent Lipid Panel Lab Results  Component Value Date/Time   CHOL 116 01/25/2018 06:30 AM   TRIG 133 01/25/2018 06:30 AM   HDL 19 (L) 01/25/2018 06:30 AM   CHOLHDL 6.1 01/25/2018 06:30 AM   LDLCALC 70 01/25/2018 06:30 AM    Wt Readings from Last 3 Encounters:  07/01/18 170 lb (77.1 kg)  04/16/18 177 lb (80.3 kg)  03/19/18 177 lb (80.3 kg)     Objective:    Vital Signs:  Ht  (1.854 m)    Wt 170 lb (77.1 kg)    BMI 22.43 kg/m    VITAL SIGNS:  reviewed  ASSESSMENT & PLAN:      1.    Chronic atrial fibrillation: Continue rate control with metoprolol long-term anticoagulation with Eliquis.  He is asymptomatic.  I refilled his medications  2.  History of mild nonobstructive coronary artery disease: He has no anginal symptoms.  Continue medical therapy.  3.  Hyperlipidemia: Continue treatment with atorvastatin with a target LDL of less than 70.  Most recent LDL in December was 70  4.  Tobacco use: I again discussed with him the importance of smoking cessation  5.  Peripheral arterial disease: Status post recent left femoropopliteal bypass .  The patient is supposed to be on  Plavix in addition to Eliquis given previous thrombotic occlusion.  He has not been taking the medication because he did not have it at the pharmacy.  I refilled this for him.   COVID-19 Education: The signs and symptoms of COVID-19 were discussed with the patient and how to seek care for testing (follow up with PCP or arrange E-visit).  The importance of social distancing was discussed today.  Time:   Today, I have spent 20 minutes with the patient with telehealth technology discussing the above problems.     Medication Adjustments/Labs and Tests Ordered: Current medicines are reviewed at length with the patient today.  Concerns regarding medicines are outlined above.   Tests Ordered: No orders of the defined types were placed in this encounter.   Medication Changes: No orders of the defined types were placed in this encounter.   Disposition:  Follow up in 4 month(s)  Signed, Lorine BearsMuhammad Madalynn Pickelsimer, MD  07/01/2018 10:31 AM    [facility]

## 2018-07-01 NOTE — Patient Instructions (Signed)
Medication Instructions:  Continue same medications.  Refills were sent to your pharmacy If you need a refill on your cardiac medications before your next appointment, please call your pharmacy.   Lab work: None If you have labs (blood work) drawn today and your tests are completely normal, you will receive your results only by: Marland Kitchen MyChart Message (if you have MyChart) OR . A paper copy in the mail If you have any lab test that is abnormal or we need to change your treatment, we will call you to review the results.  Testing/Procedures: None  Follow-Up: At [facility], you and your health needs are our priority.  As part of our continuing mission to provide you with exceptional heart care, we have created designated Provider Care Teams.  These Care Teams include your primary Cardiologist (physician) and Advanced Practice Providers (APPs -  Physician Assistants and Nurse Practitioners) who all work together to provide you with the care you need, when you need it. You will need a follow up appointment in 4 months.  Please call our office 2 months in advance to schedule this appointment.  You may see Lorine Bears, MD or one of the following Advanced Practice Providers on your designated Care Team:   Corine Shelter, PA-C Judy Pimple, [place] . Marjie Skiff, PA-C

## 2018-07-16 ENCOUNTER — Telehealth (HOSPITAL_COMMUNITY): Payer: Self-pay | Admitting: Rehabilitation

## 2018-07-16 NOTE — Telephone Encounter (Signed)
The above patient or their representative was contacted and gave the following answers to these questions:         Do you have any of the following symptoms? No  Fever                    Cough                   Shortness of breath  Do  you have any of the following other symptoms? No   muscle pain         vomiting,        diarrhea        rash         weakness        red eye        abdominal pain         bruising          bruising or bleeding              joint pain           severe headache    Have you been in contact with someone who was or has been sick in the past 2 weeks? No  Yes                 Unsure                         Unable to assess   Does the person that you were in contact with have any of the following symptoms?   Cough         shortness of breath           muscle pain         vomiting,            diarrhea            rash            weakness           fever            red eye           abdominal pain           bruising  or  bleeding                joint pain                severe headache               Have you  or someone you have been in contact with traveled internationally in th last month? No        If yes, which countries?   Have you  or someone you have been in contact with traveled outside [place] in th last month? No         If yes, which state and city?   COMMENTS OR ACTION PLAN FOR THIS PATIENT:          

## 2018-07-17 ENCOUNTER — Other Ambulatory Visit: Payer: Self-pay

## 2018-07-17 ENCOUNTER — Ambulatory Visit (HOSPITAL_COMMUNITY)
Admission: RE | Admit: 2018-07-17 | Discharge: 2018-07-17 | Disposition: A | Discharge status: Home / Self Care | Payer: BLUE CROSS/BLUE SHIELD | Source: Ambulatory Visit | Attending: Family | Admitting: Family

## 2018-07-17 ENCOUNTER — Encounter: Payer: Self-pay | Admitting: Family

## 2018-07-17 ENCOUNTER — Ambulatory Visit: Payer: BLUE CROSS/BLUE SHIELD | Admitting: Family

## 2018-07-17 ENCOUNTER — Ambulatory Visit (INDEPENDENT_AMBULATORY_CARE_PROVIDER_SITE_OTHER)
Admission: RE | Admit: 2018-07-17 | Discharge: 2018-07-17 | Disposition: A | Discharge status: Home / Self Care | Payer: BLUE CROSS/BLUE SHIELD | Source: Ambulatory Visit | Attending: Family | Admitting: Family

## 2018-07-17 VITALS — BP 124/74 | HR 87 | Temp 97.6°F | Resp 16 | Ht 73.0 in | Wt 173.0 lb

## 2018-07-17 DIAGNOSIS — Z8673 Personal history of transient ischemic attack (TIA), and cerebral infarction without residual deficits: Secondary | ICD-10-CM | POA: Diagnosis not present

## 2018-07-17 DIAGNOSIS — I779 Disorder of arteries and arterioles, unspecified: Secondary | ICD-10-CM

## 2018-07-17 DIAGNOSIS — E1151 Type 2 diabetes mellitus with diabetic peripheral angiopathy without gangrene: Secondary | ICD-10-CM

## 2018-07-17 DIAGNOSIS — I6522 Occlusion and stenosis of left carotid artery: Secondary | ICD-10-CM

## 2018-07-17 DIAGNOSIS — I6521 Occlusion and stenosis of right carotid artery: Secondary | ICD-10-CM

## 2018-07-17 DIAGNOSIS — I739 Peripheral vascular disease, unspecified: Secondary | ICD-10-CM

## 2018-07-17 DIAGNOSIS — F17200 Nicotine dependence, unspecified, uncomplicated: Secondary | ICD-10-CM

## 2018-07-17 DIAGNOSIS — Z95828 Presence of other vascular implants and grafts: Secondary | ICD-10-CM

## 2018-07-17 NOTE — Progress Notes (Signed)
[facility]    CC: Follow up peripheral artery occlusive disease  History of Present Illness Willie Morales is a 58 y.o. male who is s/p left femoral to below the knee popliteal artery bypass graft with PTFE by Dr. Imogene Burn in March 2019. He experienced acute thrombosis of this graft and went back to the operating room with Dr. Edilia Bo for thrombectomy as well as patch angioplasty of the popliteal and tibioperoneal trunk in July 2019. He also required thrombolysis treatment by Dr. Chestine Spore 12/5/19including balloon angioplasty of the proximal popliteal, TP trunk, PTA, and peroneal artery in addition to stenting of the left common and external iliac artery. At the same time he underwent 4 compartment fasciotomies.  Medial fasciotomy incision was closed prior to discharge however lateral incision was left open.   Pt was last evaluated on 04-16-18 by M. Eveland PA-C. At that time patient had been cleansing this wound daily and doing daily dressing changes.  The lateral fasciotomy incision was healed.  He denied any claudication, rest pain, or active tissue ischemia.  He continued to take his Eliquis and Plavix. Graft duplex demonstrated a patent left femoral to popliteal bypass without any areas of hemodynamically significant stenosis Right ABI 0.77 with TBI of 0.79 Left ABI of 0.83 with TBI of 0.74 Lateral fasciotomy incision were completely healed other than small areas of dry eschar Bypass patent by duplex Pt was advised to continue Eliquis and Plavix Return for graft surveillance with ABIs in another 3 months.  Pt returns for PAD surveillance.   He states that he has had physical therapy over 6 months ago to help him place his left heel on the floor with walking, states he had to stop PT due to insurance issues, is still unable to place left heel on the floor when walking. He states that his left leg wounds (fasciotomy site and incision) have remained completely  healed. He denies any problems with his right leg with walking.   He denies any stroke or TIA. since years ago. He denies any residual deficits.   Pt states that he is blind in his left eye, does not know why he lost vision.    Diabetic: yes, 7.6, most recent A1C result on file, in December 2019, uncontrolled Tobacco use: smoker  (1/2 ppd, started at about age 64 yrs)  Pt meds include: Statin :Yes Betablocker: Yes ASA: No Other anticoagulants/antiplatelets: Eliquis, sees Dr. Kirke Corin for chronic atrial fib, Plavix  Past Medical History:  Diagnosis Date  . Carotid artery disease (HCC)    a. 60-70% RICA, totally occluded LICA, > 50% stenosed bilat ECA 04/2017.  Marland Kitchen Chronic atrial fibrillation   . Coronary artery disease    a. remote cath in 2009 showing 30% mRCA, 30% PDA, 45% LAD, 30% diag.  . Diabetes mellitus   . Dyslipidemia   . History of kidney stones   . Hypertension   . Tobacco abuse     Social History Social History   Tobacco Use  . Smoking status: Current Every Day Smoker    Packs/day: 1.00    Years: 35.00    Pack years: 35.00    Types: Cigarettes  . Smokeless tobacco: Never Used  . Tobacco comment: 2-3 cigarettes per day  Substance Use Topics  . Alcohol use: No  . Drug use: No    Family History Family History  Problem Relation Age of Onset  . Coronary artery disease Father        CABG  in early 106's  . Heart attack Father   . Heart disease Father   . Cancer Mother   . Heart disease Mother   . Heart disease Brother   . Diabetes Maternal Grandmother   . SIDS Sister     Past Surgical History:  Procedure Laterality Date  . ABDOMINAL AORTOGRAM W/LOWER EXTREMITY N/A 07/05/2016   Procedure: Abdominal Aortogram w/Lower Extremity;  Surgeon: Fransisco Hertz, MD;  [facility] INVAS[facility] CV LAB;  Service: Cardiovascular;  Laterality: N/A;  . ABDOMINAL AORTOGRAM W/LOWER EXTREMITY N/A 05/06/2017   Procedure: ABDOMINAL AORTOGRAM W/LOWER EXTREMITY;  Surgeon: Maeola Harman, MD;  Location: [facility] INVASIVE CV LAB;  Service: Cardiovascular;  Laterality: N/A;  . APPLICATION OF WOUND VAC Left 01/29/2018   Procedure: APPLICATION OF WOUND VAC LEFT LOWER LEG MEDIAL AND LATERAL SIDES;  Surgeon: Nada Libman, MD;  Location: [facility] OR;  Service: Vascular;  Laterality: Left;  . BACK SURGERY  1991  . CARDIAC CATHETERIZATION  07/31/2007   This showed nonobstructive coronary artery disease   He had 30% lesion in the mid RCA, 30% lesion in the PDA, 45% lesion in the LAD and mid  LAD, and 30% lesion in the diagonal.  The left circumflex was normal.  The LV function was 60%.     Marland Kitchen ENDARTERECTOMY FEMORAL Left 05/09/2017   Procedure: ENDARTERECTOMY FEMORAL WITH DACRON PATCH ANGIOPLASTY;  Surgeon: Fransisco Hertz, MD;  Location: [facility] OR;  Service: Vascular;  Laterality: Left;  . ENDARTERECTOMY POPLITEAL  09/17/2017   Procedure: ENDARTERECTOMY POPLITEAL ARTERY AND TIBIAL FIBULAR TRUNK;  Surgeon: Chuck Hint, MD;  Location: [facility] OR;  Service: Vascular;;  . FASCIOTOMY CLOSURE Left 01/24/2018   Procedure: FASCIOTOMY OF LOWER LEFT LEG;  Surgeon: Nada Libman, MD;  Location: [facility] OR;  Service: Vascular;  Laterality: Left;  . FASCIOTOMY CLOSURE Left 01/29/2018   Procedure: FASCIOTOMY CLOSURE LEFT LOWER EXTREMITY;  Surgeon: Nada Libman, MD;  Location: [facility] OR;  Service: Vascular;  Laterality: Left;  . FEMORAL-POPLITEAL BYPASS GRAFT Left 05/09/2017   Procedure: BYPASS GRAFT FEMORAL-POPLITEAL ARTERY LEFT;  Surgeon: Fransisco Hertz, MD;  Location: [facility] OR;  Service: Vascular;  Laterality: Left;  . FEMORAL-POPLITEAL BYPASS GRAFT Left 09/17/2017   Procedure: THROMBECTOMY LEFT FEMORAL-POPLITEAL BYPASS GRAFT;  Surgeon: Chuck Hint, MD;  Location: [facility] OR;  Service: Vascular;  Laterality: Left;  . KIDNEY STONE SURGERY  2011  . LOWER EXTREMITY ANGIOGRAM Left 01/23/2018   Procedure: LEFT LOWER EXTREMITY ANGIOGRAM, THROMBOLYSIS Catheter Placement;  Surgeon: Cephus Shelling,  MD;  Location: [facility] OR;  Service: Vascular;  Laterality: Left;  . LOWER EXTREMITY ANGIOGRAPHY N/A 12/06/2016   Procedure: Lower Extremity Angiography;  Surgeon: Fransisco Hertz, MD;  Location: [facility] INVASIVE CV LAB;  Service: Cardiovascular;  Laterality: N/A;  . LOWER EXTREMITY ANGIOGRAPHY N/A 09/05/2017   Procedure: LOWER EXTREMITY ANGIOGRAPHY;  Surgeon: Fransisco Hertz, MD;  Location: [facility] INVASIVE CV LAB;  Service: Cardiovascular;  Laterality: N/A;  . PATCH ANGIOPLASTY Left 05/09/2017   Procedure: PATCH ANGIOPLASTY WITH PROPATEN GRAFT  OF BELOW KNEE POPLITEAL ARTERY;  Surgeon: Fransisco Hertz, MD;  Location: [facility] OR;  Service: Vascular;  Laterality: Left;  . PATCH ANGIOPLASTY Left 09/17/2017   Procedure: PATCH ANGIOPLASTY WITH PERICARDIAL BOVINE PATCH OF DISTAL ANASTOMOSIS;  Surgeon: Chuck Hint, MD;  Location: [facility] OR;  Service: Vascular;  Laterality: Left;  . PERCUTANEOUS VENOUS THROMBECTOMY,LYSIS WITH INTRAVASCULAR ULTRASOUND (IVUS) Left 01/24/2018   Procedure: Lysis Re-check;  Surgeon: Nada Libman, MD;  Location: [facility] OR;  Service: Vascular;  Laterality: Left;  . PERIPHERAL VASCULAR ATHERECTOMY Right 07/19/2016   Procedure: Peripheral Vascular Atherectomy;  Surgeon: Fransisco Hertz, MD;  Location: [facility] INVASIVE CV LAB;  Service: Cardiovascular;  Laterality: Right;  . PERIPHERAL VASCULAR BALLOON ANGIOPLASTY Left 07/05/2016   Procedure: Peripheral Vascular Balloon Angioplasty;  Surgeon: Fransisco Hertz, MD;  Location: [facility] INVASIVE CV LAB;  Service: Cardiovascular;  Laterality: Left;  SFA  . PERIPHERAL VASCULAR BALLOON ANGIOPLASTY Left 09/05/2017   Procedure: PERIPHERAL VASCULAR BALLOON ANGIOPLASTY;  Surgeon: Fransisco Hertz, MD;  Location: [facility] INVASIVE CV LAB;  Service: Cardiovascular;  Laterality: Left;  Popliteal, profunda femoral, external iliac  . PERIPHERAL VASCULAR INTERVENTION  12/06/2016   Procedure: PERIPHERAL VASCULAR INTERVENTION;  Surgeon: Fransisco Hertz, MD;  Location: [facility] INVASIVE CV LAB;  Service:  Cardiovascular;;  LEFT    Allergies  Allergen Reactions  . Ciprofloxacin Rash    Current Outpatient Medications  Medication Sig Dispense Refill  . apixaban (ELIQUIS) 5 MG TABS tablet Take 1 tablet (5 mg total) by mouth 2 (two) times daily. 60 tablet 6  . atorvastatin (LIPITOR) 40 MG tablet Take 1 tablet (40 mg total) by mouth daily at 6 PM. 60 tablet 6  . benazepril (LOTENSIN) 10 MG tablet Take 1 tablet (10 mg total) by mouth daily. 30 tablet 6  . buPROPion (WELLBUTRIN SR) 200 MG 12 hr tablet Take 200 mg by mouth every morning.  4  . clopidogrel (PLAVIX) 75 MG tablet Take 1 tablet (75 mg total) by mouth daily. 90 tablet 3  . gabapentin (NEURONTIN) 300 MG capsule Take 1 capsule (300 mg total) by mouth 3 (three) times daily. 90 capsule 11  . glimepiride (AMARYL) 4 MG tablet Take 1 tablet (4 mg total) by mouth daily with breakfast. 30 tablet 6  . HYDROcodone-acetaminophen (NORCO/VICODIN) 5-325 MG tablet Take 1 tablet by mouth every 6 (six) hours as needed for moderate pain. 20 tablet 0  . HYDROcodone-acetaminophen (NORCO/VICODIN) 5-325 MG tablet Take 1 tablet by mouth every 6 (six) hours as needed for severe pain. 15 tablet 0  . levothyroxine (SYNTHROID, LEVOTHROID) 50 MCG tablet Take 50 mcg by mouth daily before breakfast.     . metoprolol tartrate (LOPRESSOR) 50 MG tablet Take 1 tablet (50 mg total) by mouth 2 (two) times daily. 60 tablet 6  . sertraline (ZOLOFT) 50 MG tablet Take 50 mg by mouth at bedtime.  0  . sitaGLIPtin-metformin (JANUMET) 50-1000 MG tablet Take 1 tablet by mouth 2 (two) times daily.    . varenicline (CHANTIX) 0.5 MG tablet Take 0.5 mg by mouth daily for the first three days, then Take 0.5 mg by mouth twice a day for the next four days. 11 tablet 0  . cephALEXin (KEFLEX) 500 MG capsule Take 500 mg by mouth 3 (three) times daily.    . varenicline (CHANTIX CONTINUING MONTH PAK) 1 MG tablet Take 1 tablet (1 mg total) by mouth 2 (two) times daily. (Patient not taking:  Reported on 07/17/2018) 60 tablet 2   No current facility-administered medications for this visit.     ROS: See HPI for pertinent positives and negatives.   Physical Examination  Vitals:   07/17/18 0933  BP: 124/74  Pulse: 87  Resp: 16  Temp: 97.6 F (36.4 C)  TempSrc: Oral  SpO2: 100%  Weight: 173 lb (78.5 kg)  Height:  (1.854 m)   Body mass index is 22.82 kg/m.  General: A&O x 3, WDWN, slender  male. Gait: limp HENT: No gross abnormalities.  Eyes: Left pupil is larger than right. Pulmonary: Respirations are non labored, fair air movement in all fields Cardiac: irregular rhythm with controlled rate, no detected murmur.         Carotid Bruits Right Left   Negative Negative   Radial pulses: right: not palpable, left is 1-2+ palpable  Adominal aortic pulse is not palpable                         VASCULAR EXAM: Extremities with ischemic changes: right foot is cool pale, sluggish capillary refill, without Gangrene; without open wounds in lower extremities. Right wrist with raised localized tender dry lesion (inner aspect).                                                                                                          LE Pulses Right Left       FEMORAL  1+ palpable  2+ palpable        POPLITEAL  not palpable   not palpable       POSTERIOR TIBIAL  not palpable   not palpable        DORSALIS PEDIS      ANTERIOR TIBIAL not palpable  not palpable    Abdomen: soft, NT, no palpable masses. Skin: no rashes, no cellulitis, no ulcers noted. See Extremities  Musculoskeletal: no muscle wasting or atrophy.  Neurologic: A&O X 3; appropriate affect, Sensation is normal; MOTOR FUNCTION:  moving all extremities equally, motor strength 5/5 throughout. Speech is fluent/normal. CN 2-12 intact. Psychiatric: Thought content is normal, mood appropriate for clinical situation.     ASSESSMENT: Willie Morales is a 58 y.o. male who is s/p left femoral to below the knee  popliteal artery bypass graft with PTFE by Dr. Imogene Burn in March 2019. He experienced acute thrombosis of this graft and went back to the operating room with Dr. Edilia Bo for thrombectomy as well as patch angioplasty of the popliteal and tibioperoneal trunk in July 2019. He also required thrombolysis treatment by Dr. Chestine Spore 12/5/19including balloon angioplasty of the proximal popliteal, TP trunk, PTA, and peroneal artery in addition to stenting of the left common and external iliac artery. At the same time he underwent 4 compartment fasciotomies.  Medial fasciotomy incision was closed prior to discharge however lateral incision was left open.   Fasciotomy and incisions are completely healed.    - Most recent carotid duplex result on file was March 2019, right ICA at 60-79% stenosis, left ICA occluded.   - Also, significant decline in right ABI, left is stable and indicates mild disease.  His atherosclerotic risk factors include smoking since age 40 to present and uncontrolled DM. I advised him to work closely with his PCP to get his DM under as good control as possible. See Plan.  Return on 07-22-18 for carotid duplex and right LE arterial duplex, see Dr. Chestine Spore.    - Right wrist small dry wound after an injury a few weeks ago, no  fever or chills: I advised him to see his PCP ASAP re this.   DATA  Left Graft #1 (07-17-18):                      PSV cm/sStenosisWaveform  Comments +--------------------+--------+--------+----------+--------+ Inflow              42              monophasic         +--------------------+--------+--------+----------+--------+ Proximal Anastomosis63              monophasic         +--------------------+--------+--------+----------+--------+ Proximal Graft      40              monophasic         +--------------------+--------+--------+----------+--------+ Mid Graft           35              monophasic          +--------------------+--------+--------+----------+--------+ Distal Graft        29              monophasic         +--------------------+--------+--------+----------+--------+ Distal Anastamosis  36              monophasic         +--------------------+--------+--------+----------+--------+ Outflow             44              monophasic         +--------------------+--------+--------+----------+--------+  Summary: Left: 75-99% stenosis noted in the deep femoral artery. Patent femoral to below knee popliteal bypass graft.   ABI (Date: 07/17/2018): ABI Findings: +---------+------------------+-----+----------+--------+ Right    Rt Pressure (mmHg)IndexWaveform  Comment  +---------+------------------+-----+----------+--------+ Brachial 125                                       +---------+------------------+-----+----------+--------+ PTA      58                0.46 monophasic         +---------+------------------+-----+----------+--------+ DP       46                0.37 monophasic         +---------+------------------+-----+----------+--------+ Great Toe51                0.41                    +---------+------------------+-----+----------+--------+  +---------+------------------+-----+----------+-------+ Left     Lt Pressure (mmHg)IndexWaveform  Comment +---------+------------------+-----+----------+-------+ Brachial 122                                      +---------+------------------+-----+----------+-------+ PTA      81                0.65 monophasic        +---------+------------------+-----+----------+-------+ DP       113               0.90 monophasic        +---------+------------------+-----+----------+-------+ Great Toe77                0.62                    +---------+------------------+-----+----------+-------+  +-------+-----------+-----------+------------+------------+  ABI/TBIToday's ABIToday's TBIPrevious ABIPrevious TBI +-------+-----------+-----------+------------+------------+ Right  0.46       0.41       0.77        0.79         +-------+-----------+-----------+------------+------------+ Left   0.90       0.62       0.83        0.74         +-------+-----------+-----------+------------+------------+   Summary: Right: Resting right ankle-brachial index indicates severe right lower extremity arterial disease. The right toe-brachial index is abnormal.  Left: Resting left ankle-brachial index indicates mild left lower extremity arterial disease. The left toe-brachial index is abnormal.    PLAN:  Based on the patient's vascular studies and examination, pt will return to clinic on 07-22-18 with right LE arterial duplex and carotid duplex, see Dr. Chestine Sporelark.  Continue Eliquis and Plavix.   Over 3 minutes was spent counseling patient re smoking cessation, and patient was given several free resources re smoking cessation.  I discussed in depth with the patient the nature of atherosclerosis, and emphasized the importance of maximal medical management including strict control of blood pressure, blood glucose, and lipid levels, obtaining regular exercise, and cessation of smoking.  The patient is aware that without maximal medical management the underlying atherosclerotic disease process will progress, limiting the benefit of any interventions.  The patient was given information about PAD including signs, symptoms, treatment, what symptoms should prompt the patient to seek immediate medical care, and risk reduction measures to take.  Charisse MarchSuzanne , RN, MSN, FNP-C [facility] [place] Office Phone: [phone]  Clinic MD: Randie HeinzCain on call  07/17/18 9:39 AM

## 2018-07-17 NOTE — Patient Instructions (Signed)
Steps to Quit Smoking ° °Smoking tobacco can be bad for your health. It can also affect almost every organ in your body. Smoking puts you and people around you at risk for many serious long-lasting (chronic) diseases. Quitting smoking is hard, but it is one of the best things that you can do for your health. It is never too late to quit. °What are the benefits of quitting smoking? °When you quit smoking, you lower your risk for getting serious diseases and conditions. They can include: °· Lung cancer or lung disease. °· Heart disease. °· Stroke. °· Heart attack. °· Not being able to have children (infertility). °· Weak bones (osteoporosis) and broken bones (fractures). °If you have coughing, wheezing, and shortness of breath, those symptoms may get better when you quit. You may also get sick less often. If you are pregnant, quitting smoking can help to lower your chances of having a baby of low birth weight. °What can I do to help me quit smoking? °Talk with your doctor about what can help you quit smoking. Some things you can do (strategies) include: °· Quitting smoking totally, instead of slowly cutting back how much you smoke over a period of time. °· Going to in-person counseling. You are more likely to quit if you go to many counseling sessions. °· Using resources and support systems, such as: °? Online chats with a counselor. °? Phone quitlines. °? Printed self-help materials. °? Support groups or group counseling. °? Text messaging programs. °? Mobile phone apps or applications. °· Taking medicines. Some of these medicines may have nicotine in them. If you are pregnant or breastfeeding, do not take any medicines to quit smoking unless your doctor says it is okay. Talk with your doctor about counseling or other things that can help you. °Talk with your doctor about using more than one strategy at the same time, such as taking medicines while you are also going to in-person counseling. This can help make  quitting easier. °What things can I do to make it easier to quit? °Quitting smoking might feel very hard at first, but there is a lot that you can do to make it easier. Take these steps: °· Talk to your family and friends. Ask them to support and encourage you. °· Call phone quitlines, reach out to support groups, or work with a counselor. °· Ask people who smoke to not smoke around you. °· Avoid places that make you want (trigger) to smoke, such as: °? Bars. °? Parties. °? Smoke-break areas at work. °· Spend time with people who do not smoke. °· Lower the stress in your life. Stress can make you want to smoke. Try these things to help your stress: °? Getting regular exercise. °? Deep-breathing exercises. °? Yoga. °? Meditating. °? Doing a body scan. To do this, close your eyes, focus on one area of your body at a time from head to toe, and notice which parts of your body are tense. Try to relax the muscles in those areas. °· Download or buy apps on your mobile phone or tablet that can help you stick to your quit plan. There are many free apps, such as QuitGuide from the CDC (Centers for Disease Control and Prevention). You can find more support from smokefree.gov and other websites. °This information is not intended to replace advice given to you by your health care provider. Make sure you discuss any questions you have with your health care provider. °Document Released: 12/02/2008 Document Revised: 10/04/2015   Document Reviewed: 06/22/2014 °Elsevier Interactive Patient Education © 2019 [employer] ° ° ° ° °Stroke Prevention °Some medical conditions and lifestyle choices can lead to a higher risk for a stroke. You can help to prevent a stroke by making nutrition, lifestyle, and other changes. °What nutrition changes can be made? ° °· Eat healthy foods. °? Choose foods that are high in fiber. These include: °§ Fresh fruits. °§ Fresh vegetables. °§ Whole grains. °? Eat at least 5 or more servings of fruits and  vegetables each day. Try to fill half of your plate at each meal with fruits and vegetables. °? Choose lean protein foods. These include: °§ Lowfat (lean) cuts of meat. °§ Chicken without skin. °§ Fish. °§ Tofu. °§ Beans. °§ Nuts. °? Eat low-fat dairy products. °? Avoid foods that: °§ Are high in salt (sodium). °§ Have saturated fat. °§ Have trans fat. °§ Have cholesterol. °§ Are processed. °§ Are premade. °· Follow eating guidelines as told by your doctor. These may include: °? Reducing how many calories you eat and drink each day. °? Limiting how much salt you eat or drink each day to 1,500 milligrams (mg). °? Using only healthy fats for cooking. These include: °§ Olive oil. °§ Canola oil. °§ Sunflower oil. °? Counting how many carbohydrates you eat and drink each day. °What lifestyle changes can be made? °· Try to stay at a healthy weight. Talk to your doctor about what a good weight is for you. °· Get at least 30 minutes of moderate physical activity at least 5 days a week. This can include: °? Fast walking. °? Biking. °? Swimming. °· Do not use any products that have nicotine or tobacco. This includes cigarettes and e-cigarettes. If you need help quitting, ask your doctor. Avoid being around tobacco smoke in general. °· Limit how much alcohol you drink to no more than 1 drink a day for nonpregnant women and 2 drinks a day for men. One drink equals 12 oz of beer, 5 oz of wine, or 1½ oz of hard liquor. °· Do not use drugs. °· Avoid taking birth control pills. Talk to your doctor about the risks of taking birth control pills if: °? You are over 35 years old. °? You smoke. °? You get migraines. °? You have had a blood clot. °What other changes can be made? °· Manage your cholesterol. °? It is important to eat a healthy diet. °? If your cholesterol cannot be managed through your diet, you may also need to take medicines. Take medicines as told by your doctor. °· Manage your diabetes. °? It is important to eat a  healthy diet and to exercise regularly. °? If your blood sugar cannot be managed through diet and exercise, you may need to take medicines. Take medicines as told by your doctor. °· Control your high blood pressure (hypertension). °? Try to keep your blood pressure below 130/80. This can help lower your risk of stroke. °? It is important to eat a healthy diet and to exercise regularly. °? If your blood pressure cannot be managed through diet and exercise, you may need to take medicines. Take medicines as told by your doctor. °? Ask your doctor if you should check your blood pressure at home. °? Have your blood pressure checked every year. Do this even if your blood pressure is normal. °· Talk to your doctor about getting checked for a sleep disorder. Signs of this can include: °? Snoring a lot. °? Feeling   very tired. °· Take over-the-counter and prescription medicines only as told by your doctor. These may include aspirin or blood thinners (antiplatelets or anticoagulants). °· Make sure that any other medical conditions you have are managed. °Where to find more information °· American Stroke Association: www.strokeassociation.org °· National Stroke Association: www.stroke.org °Get help right away if: °· You have any symptoms of stroke. "BE FAST" is an easy way to remember the main warning signs: °? B - Balance. Signs are dizziness, sudden trouble walking, or loss of balance. °? E - Eyes. Signs are trouble seeing or a sudden change in how you see. °? F - Face. Signs are sudden weakness or loss of feeling of the face, or the face or eyelid drooping on one side. °? A - Arms. Signs are weakness or loss of feeling in an arm. This happens suddenly and usually on one side of the body. °? S - Speech. Signs are sudden trouble speaking, slurred speech, or trouble understanding what people say. °? T - Time. Time to call emergency services. Write down what time symptoms started. °· You have other signs of stroke, such as: °? A  sudden, very bad headache with no known cause. °? Feeling sick to your stomach (nausea). °? Throwing up (vomiting). °? Jerky movements you cannot control (seizure). °These symptoms may represent a serious problem that is an emergency. Do not wait to see if the symptoms will go away. Get medical help right away. Call your local emergency services (911 in the [place] Do not drive yourself to the hospital. °Summary °· You can prevent a stroke by eating healthy, exercising, not smoking, drinking less alcohol, and treating other health problems, such as diabetes, high blood pressure, or high cholesterol. °· Do not use any products that contain nicotine or tobacco, such as cigarettes and e-cigarettes. °· Get help right away if you have any signs or symptoms of a stroke. °This information is not intended to replace advice given to you by your health care provider. Make sure you discuss any questions you have with your health care provider. °Document Released: 08/07/2011 Document Revised: 05/09/2016 Document Reviewed: 05/09/2016 °Elsevier Interactive Patient Education © 2019 [employer] ° ° ° ° °Peripheral Vascular Disease ° °Peripheral vascular disease (PVD) is a disease of the blood vessels that are not part of your heart and brain. A simple term for PVD is poor circulation. In most cases, PVD narrows the blood vessels that carry blood from your heart to the rest of your body. This can reduce the supply of blood to your arms, legs, and internal organs, like your stomach or kidneys. However, PVD most often affects a person’s lower legs and feet. Without treatment, PVD tends to get worse. °PVD can also lead to acute ischemic limb. This is when an arm or leg suddenly cannot get enough blood. This is a medical emergency. °Follow these instructions at home: °Lifestyle °· Do not use any products that contain nicotine or tobacco, such as cigarettes and e-cigarettes. If you need help quitting, ask your doctor. °· Lose weight if  you are overweight. Or, stay at a healthy weight as told by your doctor. °· Eat a diet that is low in fat and cholesterol. If you need help, ask your doctor. °· Exercise regularly. Ask your doctor for activities that are right for you. °General instructions °· Take over-the-counter and prescription medicines only as told by your doctor. °· Take good care of your feet: °? Wear comfortable shoes that fit well. °?   Check your feet often for any cuts or sores. °· Keep all follow-up visits as told by your doctor This is important. °Contact a doctor if: °· You have cramps in your legs when you walk. °· You have leg pain when you are at rest. °· You have coldness in a leg or foot. °· Your skin changes. °· You are unable to get or have an erection (erectile dysfunction). °· You have cuts or sores on your feet that do not heal. °Get help right away if: °· Your arm or leg turns cold, numb, and blue. °· Your arms or legs become red, warm, swollen, painful, or numb. °· You have chest pain. °· You have trouble breathing. °· You suddenly have weakness in your face, arm, or leg. °· You become very confused or you cannot speak. °· You suddenly have a very bad headache. °· You suddenly cannot see. °Summary °· Peripheral vascular disease (PVD) is a disease of the blood vessels. °· A simple term for PVD is poor circulation. Without treatment, PVD tends to get worse. °· Treatment may include exercise, low fat and low cholesterol diet, and quitting smoking. °This information is not intended to replace advice given to you by your health care provider. Make sure you discuss any questions you have with your health care provider. °Document Released: 05/02/2009 Document Revised: 03/15/2016 Document Reviewed: 03/15/2016 °[employer] Interactive Patient Education © 2019 [employer] ° °

## 2018-07-21 ENCOUNTER — Telehealth (HOSPITAL_COMMUNITY): Payer: Self-pay | Admitting: Rehabilitation

## 2018-07-21 NOTE — Telephone Encounter (Signed)

## 2018-07-22 ENCOUNTER — Ambulatory Visit: Payer: BLUE CROSS/BLUE SHIELD | Admitting: Vascular Surgery

## 2018-07-22 ENCOUNTER — Ambulatory Visit (INDEPENDENT_AMBULATORY_CARE_PROVIDER_SITE_OTHER)
Admission: RE | Admit: 2018-07-22 | Discharge: 2018-07-22 | Disposition: A | Discharge status: Home / Self Care | Payer: BC Managed Care – PPO | Source: Ambulatory Visit | Attending: Vascular Surgery | Admitting: Vascular Surgery

## 2018-07-22 ENCOUNTER — Encounter: Payer: Self-pay | Admitting: Vascular Surgery

## 2018-07-22 ENCOUNTER — Other Ambulatory Visit: Payer: Self-pay

## 2018-07-22 ENCOUNTER — Ambulatory Visit (HOSPITAL_COMMUNITY)
Admission: RE | Admit: 2018-07-22 | Discharge: 2018-07-22 | Disposition: A | Discharge status: Home / Self Care | Payer: BC Managed Care – PPO | Source: Ambulatory Visit | Attending: Vascular Surgery | Admitting: Vascular Surgery

## 2018-07-22 VITALS — BP 110/72 | HR 69 | Temp 97.8°F | Resp 18 | Ht 72.0 in | Wt 170.0 lb

## 2018-07-22 DIAGNOSIS — I779 Disorder of arteries and arterioles, unspecified: Secondary | ICD-10-CM

## 2018-07-22 DIAGNOSIS — I6521 Occlusion and stenosis of right carotid artery: Secondary | ICD-10-CM

## 2018-07-22 DIAGNOSIS — I739 Peripheral vascular disease, unspecified: Secondary | ICD-10-CM | POA: Diagnosis not present

## 2018-07-22 MED ORDER — VARENICLINE TARTRATE 0.5 MG PO TABS: ORAL_TABLET | ORAL | 0 refills | Status: DC

## 2018-07-22 MED ORDER — VARENICLINE TARTRATE 1 MG PO TABS: 1.0000 mg | ORAL_TABLET | Freq: Two times a day (BID) | ORAL | 2 refills | Status: DC

## 2018-07-22 NOTE — Progress Notes (Signed)
Patient name: Willie Morales MRN: [id number] DOB: 1960-04-27 Sex: male  REASON FOR VISIT: Follow-up from nurse practitioner visit  HPI: Willie Morales is a 58 y.o. male with history of carotid disease, atrial fibrillation on Eliquis, diabetes, dyslipidemia, tobacco abuse, severe peripheral arterial disease that presents for carotid duplex and right leg duplex after seeing the nurse practitioner last week.  In reviewing the notes from NP visit, my impression was that the patient had new right leg symptoms and drop in ABI that needs to be evaluated.  On presentation today states his right leg actually has been doing very well.  He denies any rest pain at night or new tissue loss.  He is apparently had a right femoropopliteal orbital atherectomy with drug-coated angioplasty in 2018 by Dr. Imogene Burn. On the left he has had a fem pop bypass with PTFE that has occluded multiple times and underwent most recent salvage last year with attempt at thrombolysis and complex endo intervention. He reports no new strokes or TIAs.  He thinks he had a stroke years ago but nothing recently including no vision loss weakness or numbness.  He has a known left ICA occlusion. Still smoking.    Past Medical History:  Diagnosis Date  . Carotid artery disease (HCC)    a. 60-70% RICA, totally occluded LICA, > 50% stenosed bilat ECA 04/2017.  Marland Kitchen Chronic atrial fibrillation   . Coronary artery disease    a. remote cath in 2009 showing 30% mRCA, 30% PDA, 45% LAD, 30% diag.  . Diabetes mellitus   . Dyslipidemia   . History of kidney stones   . Hypertension   . Tobacco abuse     Past Surgical History:  Procedure Laterality Date  . ABDOMINAL AORTOGRAM W/LOWER EXTREMITY N/A 07/05/2016   Procedure: Abdominal Aortogram w/Lower Extremity;  Surgeon: Fransisco Hertz, MD;  Location: [facility] INVASIVE CV LAB;  Service: Cardiovascular;  Laterality: N/A;  . ABDOMINAL AORTOGRAM W/LOWER EXTREMITY N/A 05/06/2017   Procedure: ABDOMINAL AORTOGRAM  W/LOWER EXTREMITY;  Surgeon: Maeola Harman, MD;  Location: [facility] INVASIVE CV LAB;  Service: Cardiovascular;  Laterality: N/A;  . APPLICATION OF WOUND VAC Left 01/29/2018   Procedure: APPLICATION OF WOUND VAC LEFT LOWER LEG MEDIAL AND LATERAL SIDES;  Surgeon: Nada Libman, MD;  Location: [facility] OR;  Service: Vascular;  Laterality: Left;  . BACK SURGERY  1991  . CARDIAC CATHETERIZATION  07/31/2007   This showed nonobstructive coronary artery disease   He had 30% lesion in the mid RCA, 30% lesion in the PDA, 45% lesion in the LAD and mid  LAD, and 30% lesion in the diagonal.  The left circumflex was normal.  The LV function was 60%.     Marland Kitchen ENDARTERECTOMY FEMORAL Left 05/09/2017   Procedure: ENDARTERECTOMY FEMORAL WITH DACRON PATCH ANGIOPLASTY;  Surgeon: Fransisco Hertz, MD;  Location: [facility] OR;  Service: Vascular;  Laterality: Left;  . ENDARTERECTOMY POPLITEAL  09/17/2017   Procedure: ENDARTERECTOMY POPLITEAL ARTERY AND TIBIAL FIBULAR TRUNK;  Surgeon: Chuck Hint, MD;  Location: [facility] OR;  Service: Vascular;;  . FASCIOTOMY CLOSURE Left 01/24/2018   Procedure: FASCIOTOMY OF LOWER LEFT LEG;  Surgeon: Nada Libman, MD;  Location: [facility] OR;  Service: Vascular;  Laterality: Left;  . FASCIOTOMY CLOSURE Left 01/29/2018   Procedure: FASCIOTOMY CLOSURE LEFT LOWER EXTREMITY;  Surgeon: Nada Libman, MD;  Location: [facility] OR;  Service: Vascular;  Laterality: Left;  . FEMORAL-POPLITEAL BYPASS GRAFT Left 05/09/2017   Procedure: BYPASS GRAFT FEMORAL-POPLITEAL  ARTERY LEFT;  Surgeon: Fransisco Hertz, MD;  Location: [facility] OR;  Service: Vascular;  Laterality: Left;  . FEMORAL-POPLITEAL BYPASS GRAFT Left 09/17/2017   Procedure: THROMBECTOMY LEFT FEMORAL-POPLITEAL BYPASS GRAFT;  Surgeon: Chuck Hint, MD;  Location: [facility] OR;  Service: Vascular;  Laterality: Left;  . KIDNEY STONE SURGERY  2011  . LOWER EXTREMITY ANGIOGRAM Left 01/23/2018   Procedure: LEFT LOWER EXTREMITY ANGIOGRAM, THROMBOLYSIS Catheter  Placement;  Surgeon: Cephus Shelling, MD;  Location: [facility] OR;  Service: Vascular;  Laterality: Left;  . LOWER EXTREMITY ANGIOGRAPHY N/A 12/06/2016   Procedure: Lower Extremity Angiography;  Surgeon: Fransisco Hertz, MD;  Location: [facility] INVASIVE CV LAB;  Service: Cardiovascular;  Laterality: N/A;  . LOWER EXTREMITY ANGIOGRAPHY N/A 09/05/2017   Procedure: LOWER EXTREMITY ANGIOGRAPHY;  Surgeon: Fransisco Hertz, MD;  Location: [facility] INVASIVE CV LAB;  Service: Cardiovascular;  Laterality: N/A;  . PATCH ANGIOPLASTY Left 05/09/2017   Procedure: PATCH ANGIOPLASTY WITH PROPATEN GRAFT  OF BELOW KNEE POPLITEAL ARTERY;  Surgeon: Fransisco Hertz, MD;  Location: [facility] OR;  Service: Vascular;  Laterality: Left;  . PATCH ANGIOPLASTY Left 09/17/2017   Procedure: PATCH ANGIOPLASTY WITH PERICARDIAL BOVINE PATCH OF DISTAL ANASTOMOSIS;  Surgeon: Chuck Hint, MD;  Location: [facility] OR;  Service: Vascular;  Laterality: Left;  . PERCUTANEOUS VENOUS THROMBECTOMY,LYSIS WITH INTRAVASCULAR ULTRASOUND (IVUS) Left 01/24/2018   Procedure: Lysis Re-check;  Surgeon: Nada Libman, MD;  Location: [facility] OR;  Service: Vascular;  Laterality: Left;  . PERIPHERAL VASCULAR ATHERECTOMY Right 07/19/2016   Procedure: Peripheral Vascular Atherectomy;  Surgeon: Fransisco Hertz, MD;  Location: [facility] INVASIVE CV LAB;  Service: Cardiovascular;  Laterality: Right;  . PERIPHERAL VASCULAR BALLOON ANGIOPLASTY Left 07/05/2016   Procedure: Peripheral Vascular Balloon Angioplasty;  Surgeon: Fransisco Hertz, MD;  Location: [facility] INVASIVE CV LAB;  Service: Cardiovascular;  Laterality: Left;  SFA  . PERIPHERAL VASCULAR BALLOON ANGIOPLASTY Left 09/05/2017   Procedure: PERIPHERAL VASCULAR BALLOON ANGIOPLASTY;  Surgeon: Fransisco Hertz, MD;  Location: [facility] INVASIVE CV LAB;  Service: Cardiovascular;  Laterality: Left;  Popliteal, profunda femoral, external iliac  . PERIPHERAL VASCULAR INTERVENTION  12/06/2016   Procedure: PERIPHERAL VASCULAR INTERVENTION;  Surgeon: Fransisco Hertz, MD;   Location: [facility] INVASIVE CV LAB;  Service: Cardiovascular;;  LEFT    Family History  Problem Relation Age of Onset  . Coronary artery disease Father        CABG in early 23's  . Heart attack Father   . Heart disease Father   . Cancer Mother   . Heart disease Mother   . Heart disease Brother   . Diabetes Maternal Grandmother   . SIDS Sister     SOCIAL HISTORY: Social History   Tobacco Use  . Smoking status: Current Every Day Smoker    Packs/day: 1.00    Years: 35.00    Pack years: 35.00    Types: Cigarettes  . Smokeless tobacco: Never Used  . Tobacco comment: 2-3 cigarettes per day  Substance Use Topics  . Alcohol use: No    Allergies  Allergen Reactions  . Ciprofloxacin Rash    Current Outpatient Medications  Medication Sig Dispense Refill  . apixaban (ELIQUIS) 5 MG TABS tablet Take 1 tablet (5 mg total) by mouth 2 (two) times daily. 60 tablet 6  . atorvastatin (LIPITOR) 40 MG tablet Take 1 tablet (40 mg total) by mouth daily at 6 PM. 60 tablet 6  . benazepril (LOTENSIN) 10 MG tablet Take 1 tablet (10 mg  total) by mouth daily. 30 tablet 6  . buPROPion (WELLBUTRIN SR) 200 MG 12 hr tablet Take 200 mg by mouth every morning.  4  . clopidogrel (PLAVIX) 75 MG tablet Take 1 tablet (75 mg total) by mouth daily. 90 tablet 3  . doxycycline (VIBRA-TABS) 100 MG tablet TAKE 1 TABLET BY MOUTH TWICE DAILY FOR 14 DAYS    . gabapentin (NEURONTIN) 300 MG capsule Take 1 capsule (300 mg total) by mouth 3 (three) times daily. 90 capsule 11  . glimepiride (AMARYL) 4 MG tablet Take 1 tablet (4 mg total) by mouth daily with breakfast. 30 tablet 6  . HYDROcodone-acetaminophen (NORCO/VICODIN) 5-325 MG tablet Take 1 tablet by mouth every 6 (six) hours as needed for moderate pain. 20 tablet 0  . HYDROcodone-acetaminophen (NORCO/VICODIN) 5-325 MG tablet Take 1 tablet by mouth every 6 (six) hours as needed for severe pain. 15 tablet 0  . levothyroxine (SYNTHROID, LEVOTHROID) 50 MCG tablet Take 50 mcg  by mouth daily before breakfast.     . metoprolol tartrate (LOPRESSOR) 50 MG tablet Take 1 tablet (50 mg total) by mouth 2 (two) times daily. 60 tablet 6  . sertraline (ZOLOFT) 50 MG tablet Take 50 mg by mouth at bedtime.  0  . sitaGLIPtin-metformin (JANUMET) 50-1000 MG tablet Take 1 tablet by mouth 2 (two) times daily.    . varenicline (CHANTIX CONTINUING MONTH PAK) 1 MG tablet Take 1 tablet (1 mg total) by mouth 2 (two) times daily. 60 tablet 2  . varenicline (CHANTIX) 0.5 MG tablet Take 0.5 mg by mouth daily for the first three days, then Take 0.5 mg by mouth twice a day for the next four days. 11 tablet 0  . cephALEXin (KEFLEX) 500 MG capsule Take 500 mg by mouth 3 (three) times daily.     No current facility-administered medications for this visit.     REVIEW OF SYSTEMS:  [X]  denotes positive finding, [ ]  denotes negative finding Cardiac  Comments:  Chest pain or chest pressure:    Shortness of breath upon exertion:    Short of breath when lying flat:    Irregular heart rhythm:        Vascular    Pain in calf, thigh, or hip brought on by ambulation:    Pain in feet at night that wakes you up from your sleep:     Blood clot in your veins:    Leg swelling:         Pulmonary    Oxygen at home:    Productive cough:     Wheezing:         Neurologic    Sudden weakness in arms or legs:     Sudden numbness in arms or legs:     Sudden onset of difficulty speaking or slurred speech:    Temporary loss of vision in one eye:     Problems with dizziness:         Gastrointestinal    Blood in stool:     Vomited blood:         Genitourinary    Burning when urinating:     Blood in urine:        Psychiatric    Major depression:         Hematologic    Bleeding problems:    Problems with blood clotting too easily:        Skin    Rashes or ulcers:  Constitutional    Fever or chills:      PHYSICAL EXAM: Vitals:   07/22/18 1511  BP: 110/72  Pulse: 69  Resp: 18   Temp: 97.8 F (36.6 C)  TempSrc: Oral  SpO2: 100%  Weight: 170 lb (77.1 kg)  Height: 6' (1.829 m)    GENERAL: The patient is a well-nourished male, in no acute distress. The vital signs are documented above. CARDIAC: There is a regular rate and rhythm.  VASCULAR:  1+femoral pulse palpable both groins R DP/Pt signals monophasic L DP/PT monophasic No tissue loss or ulcers Left leg fasciotomies well healed PULMONARY: There is good air exchange bilaterally without wheezing or rales. ABDOMEN: Soft and non-tender with normal pitched bowel sounds.  MUSCULOSKELETAL: There are no major deformities or cyanosis. NEUROLOGIC: No focal weakness or paresthesias are detected.  CN II-XII grosly intact.   DATA:   Carotid duplex shows a 60-79% stenosis of the right ICA and left ICA is occluded which is a chronic finding.  Lower extremity arterial duplex shows monophasic runoff throughout the right lower extremity from the common common femoral artery down through the tibials.  Only focal velocity elevation was in the distal SFA with a velocity of 168.  Assessment/Plan:  58 year old male well-known to the vascular surgery service that presents for further follow-up and discussion after seeing nurse practitioner last week.  Regarding his right lower extremity he has severely depressed ABIs 0.46 and monophasic runoff on his right lower extremity duplex.  In discussing and examining the patient he denies any rest pain or tissue loss or claudication in the right leg and feels that his right leg is doing fine.  I do not see any role for intervention in otherwise asymptomatic extremity ([place] ABI very low).    Regarding his left lower extremity I did review his duplex from last week and he has very sluggish velocities throughout the left lower leg graft.  I discussed that this could be a sign of impending graft failure and potentially arteriogram and intervention would be a good idea to prevent this.   Velocities were also low in Feb.  He is again of the mindset that he does not want any intervention and is just trying to rehab his leg after getting fasciotomies when he presented with acute limb ischemia last year.  Carotid duplex is otherwise stable with a moderate right ICA stenosis of 60-79% that is asymptomatic and a known chronic left ICA occlusion.  Discussed the importance of smoking cessation.  He is otherwise asymptomatic from his carotids.  Will have him return in 6 months for ongoing surveillance with carotid duplex, bilateral lower extremity arterial duplex, and ABIs.  Discussed that he call our office if things are worse in the meantime.   Cephus Shelling, MD [facility] Office: [phone] Pager: [phone]

## 2018-08-06 NOTE — Progress Notes (Signed)
Referring Provider: Dr. Margo AyeHall Primary Care Physician:  Benita StabileHall, John Z, MD Primary Gastroenterologist:  Dr. Jena Gaussourk  Chief Complaint  Patient presents with  . Colonoscopy    HPI:   Willie Morales is a 58 y.o. male presenting today at the request of Dr. Margo AyeHall for screening colonoscopy. No prior colonoscopy. Patient has a history significant for of atrial fibrillation, on Eliquis, severe PAD, coronary artery disease, and carotid artery disease, on Plavix, HTN, and diabetes.  No GI complaints. Denies GERD symptoms, dysphagia, N/V, abdominal pain, constipation, diarrhea, hematochezia, melena. Denies fever, chills, light headedness, dizziness. Reports 50 lb weight loss over the last 2 years due to being sick and having multiple surgeries due to his peripheral vascular disease. States his weight is now remaining stable as he is starting to turn the corner and is recovering well from his last surgery in December. Appetite is ok. No chest pain, palpitations, shortness of breath.    No FH colon cancer or polyps.  Still smoking, trying to quit, down to 1/2 pack. No alcohol or drug use.    Past Medical History:  Diagnosis Date  . Carotid artery disease (HCC)    a. 60-70% RICA, totally occluded LICA, > 50% stenosed bilat ECA 04/2017.  Marland Kitchen. Chronic atrial fibrillation   . Coronary artery disease    a. remote cath in 2009 showing 30% mRCA, 30% PDA, 45% LAD, 30% diag.  . Diabetes mellitus   . Dyslipidemia   . History of kidney stones   . Hypertension   . PAD (peripheral artery disease) (HCC)   . Tobacco abuse     Past Surgical History:  Procedure Laterality Date  . ABDOMINAL AORTOGRAM W/LOWER EXTREMITY N/A 07/05/2016   Procedure: Abdominal Aortogram w/Lower Extremity;  Surgeon: Fransisco Hertzhen, Brian L, MD;  [facility];  Service: Cardiovascular;  Laterality: N/A;  . ABDOMINAL AORTOGRAM W/LOWER EXTREMITY N/A 05/06/2017   Procedure: ABDOMINAL AORTOGRAM W/LOWER EXTREMITY;  Surgeon: Maeola Harmanain, Brandon  Christopher, MD;  [facility];  Service: Cardiovascular;  Laterality: N/A;  . APPLICATION OF WOUND VAC Left 01/29/2018   Procedure: APPLICATION OF WOUND VAC LEFT LOWER LEG MEDIAL AND LATERAL SIDES;  Surgeon: Nada LibmanBrabham, Vance W, MD;  Location: [facility];  Service: Vascular;  Laterality: Left;  . BACK SURGERY  1991  . CARDIAC CATHETERIZATION  07/31/2007   This showed nonobstructive coronary artery disease   He had 30% lesion in the mid RCA, 30% lesion in the PDA, 45% lesion in the LAD and mid  LAD, and 30% lesion in the diagonal.  The left circumflex was normal.  The LV function was 60%.     Marland Kitchen. ENDARTERECTOMY FEMORAL Left 05/09/2017   Procedure: ENDARTERECTOMY FEMORAL WITH DACRON PATCH ANGIOPLASTY;  Surgeon: Fransisco Hertzhen, Brian L, MD;  [facility];  Service: Vascular;  Laterality: Left;  . ENDARTERECTOMY POPLITEAL  09/17/2017   Procedure: ENDARTERECTOMY POPLITEAL ARTERY AND TIBIAL FIBULAR TRUNK;  Surgeon: Chuck Hintickson, Christopher S, MD;  [facility];  Service: Vascular;;  . FASCIOTOMY CLOSURE Left 01/24/2018   Procedure: FASCIOTOMY OF LOWER LEFT LEG;  Surgeon: Nada LibmanBrabham, Vance W, MD;  [facility]: [facility];  Service: Vascular;  Laterality: Left;  . FASCIOTOMY CLOSURE Left 01/29/2018   Procedure: FASCIOTOMY CLOSURE LEFT LOWER EXTREMITY;  Surgeon: Nada LibmanBrabham, Vance W, MD;  [facility];  Service: Vascular;  Laterality: Left;  . FEMORAL-POPLITEAL BYPASS GRAFT Left 05/09/2017   Procedure: BYPASS GRAFT FEMORAL-POPLITEAL ARTERY LEFT;  Surgeon: Fransisco Hertzhen, Brian L, MD;  [facility] OR;  Service: Vascular;  Laterality: Left;  . FEMORAL-POPLITEAL BYPASS GRAFT Left 09/17/2017   Procedure: THROMBECTOMY LEFT FEMORAL-POPLITEAL BYPASS GRAFT;  Surgeon: Chuck Hintickson, Christopher S, MD;  [facility];  Service: Vascular;  Laterality: Left;  . KIDNEY STONE SURGERY  2011  . LOWER EXTREMITY ANGIOGRAM Left 01/23/2018   Procedure: LEFT LOWER EXTREMITY ANGIOGRAM, THROMBOLYSIS Catheter Placement;  Surgeon: Cephus Shellinglark, Christopher J, MD;   [facility];  Service: Vascular;  Laterality: Left;  . LOWER EXTREMITY ANGIOGRAPHY N/A 12/06/2016   Procedure: Lower Extremity Angiography;  Surgeon: Fransisco Hertzhen, Brian L, MD;  [facility];  Service: Cardiovascular;  Laterality: N/A;  . LOWER EXTREMITY ANGIOGRAPHY N/A 09/05/2017   Procedure: LOWER EXTREMITY ANGIOGRAPHY;  Surgeon: Fransisco Hertzhen, Brian L, MD;  [facility];  Service: Cardiovascular;  Laterality: N/A;  . PATCH ANGIOPLASTY Left 05/09/2017   Procedure: PATCH ANGIOPLASTY WITH PROPATEN GRAFT  OF BELOW KNEE POPLITEAL ARTERY;  Surgeon: Fransisco Hertzhen, Brian L, MD;  [facility];  Service: Vascular;  Laterality: Left;  . PATCH ANGIOPLASTY Left 09/17/2017   Procedure: PATCH ANGIOPLASTY WITH PERICARDIAL BOVINE PATCH OF DISTAL ANASTOMOSIS;  Surgeon: Chuck Hintickson, Christopher S, MD;  [facility];  Service: Vascular;  Laterality: Left;  . PERCUTANEOUS VENOUS THROMBECTOMY,LYSIS WITH INTRAVASCULAR ULTRASOUND (IVUS) Left 01/24/2018   Procedure: Lysis Re-check;  Surgeon: Nada LibmanBrabham, Vance W, MD;  [facility]: [facility];  Service: Vascular;  Laterality: Left;  . PERIPHERAL VASCULAR ATHERECTOMY Right 07/19/2016   Procedure: Peripheral Vascular Atherectomy;  Surgeon: Fransisco Hertzhen, Brian L, MD;  [facility];  Service: Cardiovascular;  Laterality: Right;  . PERIPHERAL VASCULAR BALLOON ANGIOPLASTY Left 07/05/2016   Procedure: Peripheral Vascular Balloon Angioplasty;  Surgeon: Fransisco Hertzhen, Brian L, MD;  [facility];  Service: Cardiovascular;  Laterality: Left;  SFA  . PERIPHERAL VASCULAR BALLOON ANGIOPLASTY Left 09/05/2017   Procedure: PERIPHERAL VASCULAR BALLOON ANGIOPLASTY;  Surgeon: Fransisco Hertzhen, Brian L, MD;  [facility];  Service: Cardiovascular;  Laterality: Left;  Popliteal, profunda femoral, external iliac  . PERIPHERAL VASCULAR INTERVENTION  12/06/2016   Procedure: PERIPHERAL VASCULAR INTERVENTION;  Surgeon: Fransisco Hertzhen, Brian L, MD;  [facility];  Service:  Cardiovascular;;  LEFT    Current Outpatient Medications  Medication Sig Dispense Refill  . apixaban (ELIQUIS) 5 MG TABS tablet Take 1 tablet (5 mg total) by mouth 2 (two) times daily. 60 tablet 6  . atorvastatin (LIPITOR) 40 MG tablet Take 1 tablet (40 mg total) by mouth daily at 6 PM. 60 tablet 6  . benazepril (LOTENSIN) 10 MG tablet Take 1 tablet (10 mg total) by mouth daily. 30 tablet 6  . buPROPion (WELLBUTRIN SR) 200 MG 12 hr tablet Take 200 mg by mouth every morning.  4  . clopidogrel (PLAVIX) 75 MG tablet Take 1 tablet (75 mg total) by mouth daily. 90 tablet 3  . gabapentin (NEURONTIN) 300 MG capsule Take 1 capsule (300 mg total) by mouth 3 (three) times daily. 90 capsule 11  . glimepiride (AMARYL) 4 MG tablet Take 1 tablet (4 mg total) by mouth daily with breakfast. 30 tablet 6  . HYDROcodone-acetaminophen (NORCO/VICODIN) 5-325 MG tablet Take 1 tablet by mouth every 6 (six) hours as needed for moderate pain. 20 tablet 0  . levothyroxine (SYNTHROID, LEVOTHROID) 50 MCG tablet Take 50 mcg by mouth daily before breakfast.     . metoprolol tartrate (LOPRESSOR) 50 MG tablet Take 1 tablet (50 mg total) by mouth 2 (two) times daily. 60  tablet 6  . sertraline (ZOLOFT) 50 MG tablet Take 50 mg by mouth at bedtime.  0  . sitaGLIPtin-metformin (JANUMET) 50-1000 MG tablet Take 1 tablet by mouth 2 (two) times daily.     No current facility-administered medications for this visit.     Allergies as of 08/07/2018 - Review Complete 08/07/2018  Allergen Reaction Noted  . Ciprofloxacin Rash 01/08/2011    Family History  Problem Relation Age of Onset  . Coronary artery disease Father        CABG in early 65's  . Heart attack Father   . Heart disease Father   . Cancer Mother   . Heart disease Mother   . Heart disease Brother   . Diabetes Maternal Grandmother   . SIDS Sister   . Colon cancer Neg Hx     Social History   Socioeconomic History  . Marital status: Married    Spouse name: Not  on file  . Number of children: Not on file  . Years of education: Not on file  . Highest education level: Not on file  Occupational History  . Occupation: Development worker, community    Comment: by trade  Social Needs  . Financial resource strain: Not on file  . Food insecurity    Worry: Not on file    Inability: Not on file  . Transportation needs    Medical: Not on file    Non-medical: Not on file  Tobacco Use  . Smoking status: Current Every Day Smoker    Packs/day: 0.50    Years: 35.00    Pack years: 17.50    Types: Cigarettes  . Smokeless tobacco: Never Used  . Tobacco comment: 2-3 cigarettes per day  Substance and Sexual Activity  . Alcohol use: No  . Drug use: No  . Sexual activity: Not on file  Lifestyle  . Physical activity    Days per week: Not on file    Minutes per session: Not on file  . Stress: Not on file  Relationships  . Social Herbalist on phone: Not on file    Gets together: Not on file    Attends religious service: Not on file    Active member of club or organization: Not on file    Attends meetings of clubs or organizations: Not on file    Relationship status: Not on file  . Intimate partner violence    Fear of current or ex partner: Not on file    Emotionally abused: Not on file    Physically abused: Not on file    Forced sexual activity: Not on file  Other Topics Concern  . Not on file  Social History Narrative   Has 2 children    Lives in [place] with wife   Currently unemployed    Review of Systems: Gen: See HPI CV: Denies chest pain, heart palpitations, syncope.  Resp: Denies shortness of breath at rest or with exertion. Denies wheezing, occasional cough attributed to long history of smoking.  GI: See HPI GU : Denies urinary burning, urinary frequency, urinary hesitancy MS: Denies joint pain. Surgery on left leg 6 months ago due to PAD recovering well now with minimal pain at this time.   Derm: Denies rash, itching, dry skin Psych:  Denies depression, anxiety. Well managed on medication.  Heme: Denies bruising, bleeding  Physical Exam: BP 121/77   Pulse 90   Temp (!) 96.7 F (35.9 C)   Ht 6\' 1"  (  1.854 m)   Wt 174 lb 12.8 oz (79.3 kg)   BMI 23.06 kg/m  General:   Alert and oriented. Pleasant and cooperative. Well-nourished and well-developed.  Head:  Normocephalic and atraumatic. Eyes:  Without icterus, sclera clear and conjunctiva pink.  Ears:  Normal auditory acuity. Nose:  No deformity, discharge,  or lesions.  Lungs:  Clear to auscultation bilaterally. No wheezes, rales, or rhonchi. No distress.  Heart:  Irregularly irregular rhythm present without murmurs appreciated.  Abdomen:  +BS, soft, non-tender and non-distended. No HSM noted. No guarding or rebound. No masses appreciated.  Rectal:  Deferred  Msk:  Symmetrical without gross deformities. Normal posture. Pulses:  Normal pulses noted. Extremities:  Without clubbing or edema.  Neurologic:  Alert and  oriented x4;  grossly normal neurologically. Skin:  Scars on legs from multiple surgeries due to PAD. Left leg with fasciotomy in December 2019 healing well.  Psych:  Alert and cooperative. Normal mood and affect.

## 2018-08-07 ENCOUNTER — Ambulatory Visit: Payer: BC Managed Care – PPO | Admitting: Gastroenterology

## 2018-08-07 ENCOUNTER — Encounter: Payer: Self-pay | Admitting: Gastroenterology

## 2018-08-07 ENCOUNTER — Telehealth: Payer: Self-pay

## 2018-08-07 ENCOUNTER — Other Ambulatory Visit: Payer: Self-pay

## 2018-08-07 DIAGNOSIS — Z1211 Encounter for screening for malignant neoplasm of colon: Secondary | ICD-10-CM

## 2018-08-07 NOTE — Assessment & Plan Note (Addendum)
58 y.o. male with past medical history significant for atrial fibrillation, on Eliquis, severe PAD, coronary artery disease, and carotid artery disease, on Plavix, HTN, and diabetes presenting to schedule first ever screening colonoscopy. No GI complaints at this time. No alarm symptoms. No family history of colon cancer or colon polyps. Patient is current every day smoker. Trying to quit. Smokes about 1/2 pack a day now. No alcohol or illicit drug use.   Proceed with TCS with Dr. Gala Romney in the near future. The risks, benefits, and alternatives have been discussed in detail with patient. They have stated understanding and desire to proceed. Will use propofol due to polypharmacy: Wellbutrin, Zoloft, Neurontin, and Norco. Plan to hold Eliquis 48 hours prior to procedure.   Diabetes Medication Adjustments   Take 1/2 tablet Janumet in the morning and at night the day before your procedure   Take 1/2 tablet glimepiride with breakfast the day before your procedure   Hold all diabetes medications the morning of your procedure   Continue to follow your blood sugars closely and if you experience low blood sugars, correct this with clear fluids.   Follow-up as needed as patient has no GI concerns at this time.

## 2018-08-07 NOTE — Patient Instructions (Signed)
1. We will schedule you for a colonoscopy with Dr. Gala Romney in the near future.   Diabetes Medication Adjustments   Take 1/2 tablet Janumet in the morning and at night the day before your procedure   Take 1/2 tablet glimepiride with breakfast the day before your procedure   Hold all diabetes medications the morning of your procedure   Continue to follow your blood sugars closely and if you experience low blood sugars, correct this with clear fluids.    Call if you have any questions or concerns.   2. We will touch base with your cardiologist to make holding Eliquis 48 hours prior to procedure is ok.  Aliene Altes, PA-C [facility]

## 2018-08-07 NOTE — Progress Notes (Signed)
CC'D TO PCP °

## 2018-08-07 NOTE — Telephone Encounter (Signed)
Dr. Rogue Jury, pt is due for his colonoscopy screen with Dr. Gala Romney. Is it ok for pt to hold Eliquis 48 hours prior to his procedure? Please advise.

## 2018-08-07 NOTE — Telephone Encounter (Signed)
Eliquis can be held 48 hours before the procedure.  Please note that the patient is also on Plavix which was prescribed by vascular surgery due to previous thrombosis of his bypass graft.  I am not entirely certain it will be safe to hold his Plavix and you might want to check with vascular surgery regarding that.

## 2018-08-08 NOTE — Telephone Encounter (Signed)
Cornish, please see Dr. Arvilla Market note. Pt is also on Plavix . Please let me know if pt is ready to be scheduled or if general surgery needs to be contacted about pts Plavix.

## 2018-08-08 NOTE — Telephone Encounter (Signed)
We are ok to move forward with scheduling patients procedure. Plavix doesn't have to be held for a colonoscopy. Patient should continue taking his Plavix as prescribed.

## 2018-08-10 NOTE — Telephone Encounter (Signed)
Communication noted.  

## 2018-08-11 ENCOUNTER — Telehealth: Payer: Self-pay | Admitting: Internal Medicine

## 2018-08-11 ENCOUNTER — Other Ambulatory Visit: Payer: Self-pay

## 2018-08-11 ENCOUNTER — Telehealth: Payer: Self-pay

## 2018-08-11 DIAGNOSIS — Z1211 Encounter for screening for malignant neoplasm of colon: Secondary | ICD-10-CM

## 2018-08-11 MED ORDER — PEG 3350-KCL-NA BICARB-NACL 420 G PO SOLR: 4000.0000 mL | ORAL | 0 refills | Status: DC

## 2018-08-11 NOTE — Telephone Encounter (Signed)
See other phone note

## 2018-08-11 NOTE — Telephone Encounter (Signed)
Patient called to schedule his procedure  Please call back

## 2018-08-11 NOTE — Telephone Encounter (Signed)
Pre-op appt 10/30/18 at 2:15pm. Letter mailed with procedure instructions.

## 2018-08-11 NOTE — Telephone Encounter (Signed)
Opened in error

## 2018-08-11 NOTE — Telephone Encounter (Signed)
Noted, routing to [facility] Clinical nurses.

## 2018-08-11 NOTE — Telephone Encounter (Signed)
Tried to call pt, no answer, LMOVM for return call.  

## 2018-08-11 NOTE — Telephone Encounter (Signed)
Spoke to pt, TCS w/Propofol w/RMR scheduled for 11/03/18 at 9:30am. He is aware to hold Eliquis for 48 hours prior to TCS and he may continue Plavix. Rx for prep sent to pharmacy. Orders entered.

## 2018-08-14 ENCOUNTER — Emergency Department (HOSPITAL_COMMUNITY): Payer: BC Managed Care – PPO | Admitting: Anesthesiology

## 2018-08-14 ENCOUNTER — Emergency Department (HOSPITAL_COMMUNITY): Payer: BC Managed Care – PPO

## 2018-08-14 ENCOUNTER — Encounter (HOSPITAL_COMMUNITY)
Admission: EM | Disposition: A | Discharge status: Home / Self Care | Payer: Self-pay | Source: Home / Self Care | Attending: Vascular Surgery

## 2018-08-14 ENCOUNTER — Inpatient Hospital Stay (HOSPITAL_COMMUNITY)
Admission: EM | Admit: 2018-08-14 | Discharge: 2018-08-16 | DRG: 271 | Disposition: A | Discharge status: Home / Self Care | Payer: BC Managed Care – PPO | Attending: Surgery | Admitting: Surgery

## 2018-08-14 ENCOUNTER — Other Ambulatory Visit: Payer: Self-pay

## 2018-08-14 ENCOUNTER — Telehealth: Payer: Self-pay | Admitting: *Deleted

## 2018-08-14 DIAGNOSIS — Z7902 Long term (current) use of antithrombotics/antiplatelets: Secondary | ICD-10-CM

## 2018-08-14 DIAGNOSIS — I998 Other disorder of circulatory system: Secondary | ICD-10-CM | POA: Diagnosis present

## 2018-08-14 DIAGNOSIS — I482 Chronic atrial fibrillation, unspecified: Secondary | ICD-10-CM | POA: Diagnosis present

## 2018-08-14 DIAGNOSIS — Z7901 Long term (current) use of anticoagulants: Secondary | ICD-10-CM

## 2018-08-14 DIAGNOSIS — I10 Essential (primary) hypertension: Secondary | ICD-10-CM | POA: Diagnosis present

## 2018-08-14 DIAGNOSIS — Z79890 Hormone replacement therapy (postmenopausal): Secondary | ICD-10-CM

## 2018-08-14 DIAGNOSIS — E1151 Type 2 diabetes mellitus with diabetic peripheral angiopathy without gangrene: Secondary | ICD-10-CM | POA: Diagnosis present

## 2018-08-14 DIAGNOSIS — Z833 Family history of diabetes mellitus: Secondary | ICD-10-CM

## 2018-08-14 DIAGNOSIS — Z881 Allergy status to other antibiotic agents status: Secondary | ICD-10-CM

## 2018-08-14 DIAGNOSIS — Z87442 Personal history of urinary calculi: Secondary | ICD-10-CM

## 2018-08-14 DIAGNOSIS — Z7984 Long term (current) use of oral hypoglycemic drugs: Secondary | ICD-10-CM

## 2018-08-14 DIAGNOSIS — I743 Embolism and thrombosis of arteries of the lower extremities: Secondary | ICD-10-CM

## 2018-08-14 DIAGNOSIS — Z8249 Family history of ischemic heart disease and other diseases of the circulatory system: Secondary | ICD-10-CM

## 2018-08-14 DIAGNOSIS — I824Z9 Acute embolism and thrombosis of unspecified deep veins of unspecified distal lower extremity: Secondary | ICD-10-CM | POA: Diagnosis present

## 2018-08-14 DIAGNOSIS — Y832 Surgical operation with anastomosis, bypass or graft as the cause of abnormal reaction of the patient, or of later complication, without mention of misadventure at the time of the procedure: Secondary | ICD-10-CM | POA: Diagnosis present

## 2018-08-14 DIAGNOSIS — Z95820 Peripheral vascular angioplasty status with implants and grafts: Secondary | ICD-10-CM

## 2018-08-14 DIAGNOSIS — T82858A Stenosis of vascular prosthetic devices, implants and grafts, initial encounter: Secondary | ICD-10-CM | POA: Diagnosis not present

## 2018-08-14 DIAGNOSIS — F17210 Nicotine dependence, cigarettes, uncomplicated: Secondary | ICD-10-CM | POA: Diagnosis present

## 2018-08-14 DIAGNOSIS — E785 Hyperlipidemia, unspecified: Secondary | ICD-10-CM | POA: Diagnosis present

## 2018-08-14 DIAGNOSIS — Z79899 Other long term (current) drug therapy: Secondary | ICD-10-CM

## 2018-08-14 DIAGNOSIS — I6523 Occlusion and stenosis of bilateral carotid arteries: Secondary | ICD-10-CM | POA: Diagnosis present

## 2018-08-14 DIAGNOSIS — I2510 Atherosclerotic heart disease of native coronary artery without angina pectoris: Secondary | ICD-10-CM | POA: Diagnosis present

## 2018-08-14 DIAGNOSIS — Z1159 Encounter for screening for other viral diseases: Secondary | ICD-10-CM

## 2018-08-14 DIAGNOSIS — I739 Peripheral vascular disease, unspecified: Secondary | ICD-10-CM | POA: Diagnosis present

## 2018-08-14 HISTORY — PX: LOWER EXTREMITY ANGIOGRAM: SHX5508

## 2018-08-14 LAB — BASIC METABOLIC PANEL
Anion gap: 10 (ref 5–15)
BUN: 14 mg/dL (ref 6–20)
CO2: 21 mmol/L — ABNORMAL LOW (ref 22–32)
Calcium: 9.2 mg/dL (ref 8.9–10.3)
Chloride: 106 mmol/L (ref 98–111)
Creatinine, Ser: 1.01 mg/dL (ref 0.61–1.24)
GFR calc Af Amer: >60 mL/min (ref >60)
GFR calc non Af Amer: >60 mL/min (ref >60)
Glucose, Bld: 194 mg/dL — ABNORMAL HIGH (ref 70–99)
Potassium: 3.6 mmol/L (ref 3.5–5.1)
Sodium: 137 mmol/L (ref 135–145)

## 2018-08-14 LAB — CBC WITH DIFFERENTIAL/PLATELET
Abs Immature Granulocytes: 0.09 10*3/uL — ABNORMAL HIGH (ref 0.00–0.07)
Basophils Absolute: 0.1 10*3/uL (ref 0.0–0.1)
Basophils Relative: 1 %
Eosinophils Absolute: 0.2 10*3/uL (ref 0.0–0.5)
Eosinophils Relative: 1 %
HCT: 40.4 % (ref 39.0–52.0)
Hemoglobin: 12.9 g/dL — ABNORMAL LOW (ref 13.0–17.0)
Immature Granulocytes: 1 %
Lymphocytes Relative: 19 %
Lymphs Abs: 2.8 10*3/uL (ref 0.7–4.0)
MCH: 28.7 pg (ref 26.0–34.0)
MCHC: 31.9 g/dL (ref 30.0–36.0)
MCV: 90 fL (ref 80.0–100.0)
Monocytes Absolute: 1.6 10*3/uL — ABNORMAL HIGH (ref 0.1–1.0)
Monocytes Relative: 11 %
Neutro Abs: 10.2 10*3/uL — ABNORMAL HIGH (ref 1.7–7.7)
Neutrophils Relative %: 67 %
Platelets: 204 10*3/uL (ref 150–400)
RBC: 4.49 MIL/uL (ref 4.22–5.81)
RDW: 15.5 % (ref 11.5–15.5)
WBC: 15 10*3/uL — ABNORMAL HIGH (ref 4.0–10.5)
nRBC: 0 % (ref 0.0–0.2)

## 2018-08-14 LAB — PROTIME-INR
INR: 1.3 — ABNORMAL HIGH (ref 0.8–1.2)
Prothrombin Time: 15.9 seconds — ABNORMAL HIGH (ref 11.4–15.2)

## 2018-08-14 LAB — SARS CORONAVIRUS 2 BY RT PCR (HOSPITAL ORDER, PERFORMED IN ~~LOC~~ HOSPITAL LAB): SARS Coronavirus 2: NEGATIVE

## 2018-08-14 SURGERY — ANGIOGRAM, LOWER EXTREMITY
Anesthesia: Monitor Anesthesia Care | Site: Groin | Laterality: Left

## 2018-08-14 MED ORDER — LIDOCAINE 2% (20 MG/ML) 5 ML SYRINGE
INTRAMUSCULAR | Status: AC
  Filled 2018-08-14: qty 5

## 2018-08-14 MED ORDER — SODIUM CHLORIDE 0.9 % IV SOLN
INTRAVENOUS | Status: DC | PRN
  Administered 2018-08-14: 500 mL

## 2018-08-14 MED ORDER — CEFAZOLIN SODIUM-DEXTROSE 2-3 GM-%(50ML) IV SOLR
INTRAVENOUS | Status: DC | PRN
  Administered 2018-08-14: 2 g via INTRAVENOUS

## 2018-08-14 MED ORDER — MIDAZOLAM HCL 2 MG/2ML IJ SOLN
INTRAMUSCULAR | Status: DC | PRN
  Administered 2018-08-14: 2 mg via INTRAVENOUS

## 2018-08-14 MED ORDER — SODIUM CHLORIDE 0.9 % IV SOLN
INTRAVENOUS | Status: AC
  Filled 2018-08-14: qty 1.2

## 2018-08-14 MED ORDER — 0.9 % SODIUM CHLORIDE (POUR BTL) OPTIME
TOPICAL | Status: DC | PRN
  Administered 2018-08-14: 1000 mL

## 2018-08-14 MED ORDER — LIDOCAINE HCL (PF) 1 % IJ SOLN
INTRAMUSCULAR | Status: AC
  Filled 2018-08-14: qty 30

## 2018-08-14 MED ORDER — SODIUM CHLORIDE 0.9 % IV SOLN
INTRAVENOUS | Status: DC | PRN
  Administered 2018-08-14: 50 ug/min via INTRAVENOUS

## 2018-08-14 MED ORDER — SUCCINYLCHOLINE CHLORIDE 200 MG/10ML IV SOSY
PREFILLED_SYRINGE | INTRAVENOUS | Status: AC
  Filled 2018-08-14: qty 10

## 2018-08-14 MED ORDER — FENTANYL CITRATE (PF) 250 MCG/5ML IJ SOLN
INTRAMUSCULAR | Status: AC
  Filled 2018-08-14: qty 5

## 2018-08-14 MED ORDER — SODIUM CHLORIDE (PF) 0.9 % IJ SOLN
INTRAVENOUS | Status: DC | PRN
  Administered 2018-08-14: 22:00:00 45 mL via INTRAMUSCULAR

## 2018-08-14 MED ORDER — LACTATED RINGERS IV SOLN
INTRAVENOUS | Status: DC | PRN
  Administered 2018-08-14: 23:00:00 via INTRAVENOUS

## 2018-08-14 MED ORDER — FENTANYL CITRATE (PF) 250 MCG/5ML IJ SOLN
INTRAMUSCULAR | Status: DC | PRN
  Administered 2018-08-14: 50 ug via INTRAVENOUS

## 2018-08-14 MED ORDER — ROCURONIUM BROMIDE 10 MG/ML (PF) SYRINGE
PREFILLED_SYRINGE | INTRAVENOUS | Status: AC
  Filled 2018-08-14: qty 10

## 2018-08-14 MED ORDER — MIDAZOLAM HCL 2 MG/2ML IJ SOLN
INTRAMUSCULAR | Status: AC
  Filled 2018-08-14: qty 2

## 2018-08-14 MED ORDER — PROPOFOL 500 MG/50ML IV EMUL
INTRAVENOUS | Status: DC | PRN
  Administered 2018-08-14: 50 ug/kg/min via INTRAVENOUS

## 2018-08-14 MED ORDER — PROPOFOL 10 MG/ML IV BOLUS
INTRAVENOUS | Status: AC
  Filled 2018-08-14: qty 20

## 2018-08-14 MED ORDER — HEPARIN SODIUM (PORCINE) 1000 UNIT/ML IJ SOLN
INTRAMUSCULAR | Status: DC | PRN
  Administered 2018-08-14: 8000 [IU] via INTRAVENOUS

## 2018-08-14 MED ORDER — CEFAZOLIN SODIUM-DEXTROSE 2-4 GM/100ML-% IV SOLN
INTRAVENOUS | Status: AC
  Filled 2018-08-14: qty 100

## 2018-08-14 SURGICAL SUPPLY — 52 items
BAG SNAP BAND KOVER 36X36 (MISCELLANEOUS) ×2 IMPLANT
BLADE SURG 11 STRL SS (BLADE) ×2 IMPLANT
CANISTER SUCT 3000ML PPV (MISCELLANEOUS) ×2 IMPLANT
CATH ANGIO 5F BER2 100CM (CATHETERS) ×1 IMPLANT
CATH ANGIO 5F BER2 65CM (CATHETERS) IMPLANT
CATH INFUS 135CMX50CM (CATHETERS) ×1 IMPLANT
CATH OMNI FLUSH .035X70CM (CATHETERS) IMPLANT
CATH QUICKCROSS SUPP .035X90CM (MICROCATHETER) ×1 IMPLANT
CHLORAPREP W/TINT 26 (MISCELLANEOUS) ×2 IMPLANT
COVER DOME SNAP 22 D (MISCELLANEOUS) ×2 IMPLANT
COVER PROBE W GEL 5X96 (DRAPES) ×2 IMPLANT
COVER SURGICAL LIGHT HANDLE (MISCELLANEOUS) ×2 IMPLANT
COVER WAND RF STERILE (DRAPES) ×2 IMPLANT
DERMABOND ADHESIVE PROPEN (GAUZE/BANDAGES/DRESSINGS) ×1
DERMABOND ADVANCED (GAUZE/BANDAGES/DRESSINGS) ×1
DERMABOND ADVANCED .7 DNX12 (GAUZE/BANDAGES/DRESSINGS) ×1 IMPLANT
DERMABOND ADVANCED .7 DNX6 (GAUZE/BANDAGES/DRESSINGS) ×1 IMPLANT
DEVICE TORQUE KENDALL .025-038 (MISCELLANEOUS) ×1 IMPLANT
DRAPE FEMORAL ANGIO 80X135IN (DRAPES) ×2 IMPLANT
DRSG OPSITE POSTOP 4X6 (GAUZE/BANDAGES/DRESSINGS) ×1 IMPLANT
GAUZE 4X4 16PLY RFD (DISPOSABLE) ×2 IMPLANT
GAUZE SPONGE 4X4 16PLY XRAY LF (GAUZE/BANDAGES/DRESSINGS) ×1 IMPLANT
GLIDEWIRE ADV .035X260CM (WIRE) ×1 IMPLANT
GLOVE BIO SURGEON STRL SZ7.5 (GLOVE) ×2 IMPLANT
GOWN STRL REUS W/ TWL LRG LVL3 (GOWN DISPOSABLE) ×2 IMPLANT
GOWN STRL REUS W/ TWL XL LVL3 (GOWN DISPOSABLE) ×1 IMPLANT
GOWN STRL REUS W/TWL LRG LVL3 (GOWN DISPOSABLE) ×2
GOWN STRL REUS W/TWL XL LVL3 (GOWN DISPOSABLE) ×1
GUIDEWIRE ANGLED .035X150CM (WIRE) IMPLANT
KIT BASIN OR (CUSTOM PROCEDURE TRAY) ×2 IMPLANT
KIT TURNOVER KIT B (KITS) ×2 IMPLANT
MARKER SKIN DUAL TIP RULER LAB (MISCELLANEOUS) ×1 IMPLANT
NDL PERC 18GX7CM (NEEDLE) ×1 IMPLANT
NEEDLE PERC 18GX7CM (NEEDLE) ×2 IMPLANT
NS IRRIG 1000ML POUR BTL (IV SOLUTION) IMPLANT
PACK SURGICAL SETUP 50X90 (CUSTOM PROCEDURE TRAY) ×2 IMPLANT
PAD ARMBOARD 7.5X6 YLW CONV (MISCELLANEOUS) ×4 IMPLANT
PROTECTION STATION PRESSURIZED (MISCELLANEOUS) ×2
SET MICROPUNCTURE 5F STIFF (MISCELLANEOUS) ×2 IMPLANT
SHEATH AVANTI 11CM 5FR (SHEATH) IMPLANT
SHEATH PINNACLE ST 6F 45CM (SHEATH) ×1 IMPLANT
STATION PROTECTION PRESSURIZED (MISCELLANEOUS) ×1 IMPLANT
STOPCOCK MORSE 400PSI 3WAY (MISCELLANEOUS) ×2 IMPLANT
SUT SILK 2 0 PERMA HAND 18 BK (SUTURE) ×1 IMPLANT
SYR 10ML LL (SYRINGE) ×6 IMPLANT
SYR 20CC LL (SYRINGE) ×2 IMPLANT
SYR 30ML LL (SYRINGE) ×2 IMPLANT
SYR MEDRAD MARK V 150ML (SYRINGE) IMPLANT
TOWEL GREEN STERILE (TOWEL DISPOSABLE) ×4 IMPLANT
TUBING HIGH PRESSURE 120CM (CONNECTOR) IMPLANT
UNDERPAD 30X30 (UNDERPADS AND DIAPERS) IMPLANT
WIRE BENTSON .035X145CM (WIRE) ×2 IMPLANT
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
## 2018-08-14 NOTE — Anesthesia Preprocedure Evaluation (Addendum)
Anesthesia Evaluation  Patient identified by MRN, date of birth, ID band Patient awake    Reviewed: Allergy & Precautions, NPO status , Patient's Chart, lab work & pertinent test results  Airway Mallampati: II  TM Distance: >3 FB     Dental   Pulmonary Current Smoker,    breath sounds clear to auscultation       Cardiovascular hypertension, + CAD and + Peripheral Vascular Disease   Rhythm:Regular Rate:Normal     Neuro/Psych    GI/Hepatic negative GI ROS, Neg liver ROS,   Endo/Other  diabetes  Renal/GU negative Renal ROS     Musculoskeletal   Abdominal   Peds  Hematology   Anesthesia Other Findings   Reproductive/Obstetrics                             Anesthesia Physical Anesthesia Plan  ASA: III  Anesthesia Plan: MAC   Post-op Pain Management:    Induction:   PONV Risk Score and Plan: 1 and Ondansetron, Midazolam and Propofol infusion  Airway Management Planned: Simple Face Mask  Additional Equipment:   Intra-op Plan:   Post-operative Plan:   Informed Consent: I have reviewed the patients History and Physical, chart, labs and discussed the procedure including the risks, benefits and alternatives for the proposed anesthesia with the patient or authorized representative who has indicated his/her understanding and acceptance.     Dental advisory given  Plan Discussed with: CRNA, Anesthesiologist and Surgeon  Anesthesia Plan Comments:        Anesthesia Quick Evaluation

## 2018-08-14 NOTE — Telephone Encounter (Signed)
Patient left a message with answering service. "Left Leg has gone ice cold". Called patient back. C/o pain and decrease sensation, but state sensation has been poor.   Instructed to go to [facility] ER for immediate evaluation and consult with vascular for possible intervention. Stated " I'm not going to no ER today". Expressed concern of critical limb ischemia and need for fast intervention at hospital.

## 2018-08-14 NOTE — ED Provider Notes (Signed)
[facility] Provider Note   CSN: [id number] Arrival date & time: 08/14/18  1749    History   Chief Complaint No chief complaint on file.   HPI [facility] is a 58 y.o. male.     The history is provided by the patient and medical records.  Foot Pain This is a new problem. The current episode started 2 days ago. The problem occurs constantly. The problem has been gradually worsening. Pertinent negatives include no chest pain, no abdominal pain, no headaches and no shortness of breath. The symptoms are aggravated by walking, exertion and standing. Nothing relieves the symptoms. He has tried rest, a cold compress and a warm compress for the symptoms. The treatment provided no relief.    Past Medical History:  Diagnosis Date   Carotid artery disease (HCC)    a. 60-70% RICA, totally occluded LICA, > 50% stenosed bilat ECA 04/2017.   Chronic atrial fibrillation    Coronary artery disease    a. remote cath in 2009 showing 30% mRCA, 30% PDA, 45% LAD, 30% diag.   Diabetes mellitus    Dyslipidemia    History of kidney stones    Hypertension    PAD (peripheral artery disease) (HCC)    Tobacco abuse     Patient Active Problem List   Diagnosis Date Noted   Encounter for screening colonoscopy 08/07/2018   Carotid arterial disease (HCC) 07/22/2018   Hypokalemia    Hypervolemia    Chronic atrial fibrillation 01/24/2018   Essential hypertension 01/24/2018   Hyperlipidemia 01/24/2018   Uncontrolled diabetes mellitus (HCC) 01/24/2018   Tobacco abuse 01/24/2018   Acute hypoxemic respiratory failure (HCC)    Acute pulmonary edema (HCC)    Atrial fibrillation with rapid ventricular response (HCC)    Limb ischemia 01/23/2018   Pressure injury of skin 09/18/2017   Ischemic leg 09/17/2017   PAD (peripheral artery disease) (HCC) 05/09/2017   Atherosclerosis of artery of extremity with rest pain (HCC)    Preoperative  cardiovascular examination    Leukocytosis 12/10/2016   Coronary artery disease     Past Surgical History:  Procedure Laterality Date   ABDOMINAL AORTOGRAM W/LOWER EXTREMITY N/A 07/05/2016   Procedure: Abdominal Aortogram w/Lower Extremity;  Surgeon: Fransisco Hertzhen, Brian L, MD;  [facility];  Service: Cardiovascular;  Laterality: N/A;   ABDOMINAL AORTOGRAM W/LOWER EXTREMITY N/A 05/06/2017   Procedure: ABDOMINAL AORTOGRAM W/LOWER EXTREMITY;  Surgeon: Maeola Harmanain, Brandon Christopher, MD;  [facility];  Service: Cardiovascular;  Laterality: N/A;   APPLICATION OF WOUND VAC Left 01/29/2018   Procedure: APPLICATION OF WOUND VAC LEFT LOWER LEG MEDIAL AND LATERAL SIDES;  Surgeon: Nada LibmanBrabham, Vance W, MD;  [facility]: [facility] OR;  Service: Vascular;  Laterality: Left;   BACK SURGERY  1991   CARDIAC CATHETERIZATION  07/31/2007   This showed nonobstructive coronary artery disease   He had 30% lesion in the mid RCA, 30% lesion in the PDA, 45% lesion in the LAD and mid  LAD, and 30% lesion in the diagonal.  The left circumflex was normal.  The LV function was 60%.      ENDARTERECTOMY FEMORAL Left 05/09/2017   Procedure: ENDARTERECTOMY FEMORAL WITH DACRON PATCH ANGIOPLASTY;  Surgeon: Fransisco Hertzhen, Brian L, MD;  [facility];  Service: Vascular;  Laterality: Left;   ENDARTERECTOMY POPLITEAL  09/17/2017   Procedure: ENDARTERECTOMY POPLITEAL ARTERY AND TIBIAL FIBULAR TRUNK;  Surgeon: Chuck Hintickson, Christopher S, MD;  [facility];  Service: Vascular;;  FASCIOTOMY CLOSURE Left 01/24/2018   Procedure: FASCIOTOMY OF LOWER LEFT LEG;  Surgeon: Nada Libman, MD;  Location: [facility] OR;  Service: Vascular;  Laterality: Left;   FASCIOTOMY CLOSURE Left 01/29/2018   Procedure: FASCIOTOMY CLOSURE LEFT LOWER EXTREMITY;  Surgeon: Nada Libman, MD;  [facility];  Service: Vascular;  Laterality: Left;   FEMORAL-POPLITEAL BYPASS GRAFT Left 05/09/2017   Procedure: BYPASS GRAFT FEMORAL-POPLITEAL ARTERY LEFT;   Surgeon: Fransisco Hertz, MD;  Location: [facility] OR;  Service: Vascular;  Laterality: Left;   FEMORAL-POPLITEAL BYPASS GRAFT Left 09/17/2017   Procedure: THROMBECTOMY LEFT FEMORAL-POPLITEAL BYPASS GRAFT;  Surgeon: Chuck Hint, MD;  Location: [facility] OR;  Service: Vascular;  Laterality: Left;   KIDNEY STONE SURGERY  2011   LOWER EXTREMITY ANGIOGRAM Left 01/23/2018   Procedure: LEFT LOWER EXTREMITY ANGIOGRAM, THROMBOLYSIS Catheter Placement;  Surgeon: Cephus Shelling, MD;  Location: [facility] OR;  Service: Vascular;  Laterality: Left;   LOWER EXTREMITY ANGIOGRAPHY N/A 12/06/2016   Procedure: Lower Extremity Angiography;  Surgeon: Fransisco Hertz, MD;  Location: [facility] CV LAB;  Service: Cardiovascular;  Laterality: N/A;   LOWER EXTREMITY ANGIOGRAPHY N/A 09/05/2017   Procedure: LOWER EXTREMITY ANGIOGRAPHY;  Surgeon: Fransisco Hertz, MD;  [facility];  Service: Cardiovascular;  Laterality: N/A;   PATCH ANGIOPLASTY Left 05/09/2017   Procedure: PATCH ANGIOPLASTY WITH PROPATEN GRAFT  OF BELOW KNEE POPLITEAL ARTERY;  Surgeon: Fransisco Hertz, MD;  Location[facility] OR;  Service: Vascular;  Laterality: Left;   PATCH ANGIOPLASTY Left 09/17/2017   Procedure: PATCH ANGIOPLASTY WITH PERICARDIAL BOVINE PATCH OF DISTAL ANASTOMOSIS;  Surgeon: Chuck Hint, MD;  Location: [facility] OR;  Service: Vascular;  Laterality: Left;   PERCUTANEOUS VENOUS THROMBECTOMY,LYSIS WITH INTRAVASCULAR ULTRASOUND (IVUS) Left 01/24/2018   Procedure: Lysis Re-check;  Surgeon: Nada Libman, MD;  Location: [facility] OR;  Service: Vascular;  Laterality: Left;   PERIPHERAL VASCULAR ATHERECTOMY Right 07/19/2016   Procedure: Peripheral Vascular Atherectomy;  Surgeon: Fransisco Hertz, MD;  [facility] CV [facility];  Service: Cardiovascular;  Laterality: Right;   PERIPHERAL VASCULAR BALLOON ANGIOPLASTY Left 07/05/2016   Procedure: Peripheral Vascular Balloon Angioplasty;  Surgeon: Fransisco Hertz, MD;  Location: [facility] INVASIVE CV LAB;   Service: Cardiovascular;  Laterality: Left;  SFA   PERIPHERAL VASCULAR BALLOON ANGIOPLASTY Left 09/05/2017   Procedure: PERIPHERAL VASCULAR BALLOON ANGIOPLASTY;  Surgeon: Fransisco Hertz, MD;  Location: [facility] INVASIVE CV LAB;  Service: Cardiovascular;  Laterality: Left;  Popliteal, profunda femoral, external iliac   PERIPHERAL VASCULAR INTERVENTION  12/06/2016   Procedure: PERIPHERAL VASCULAR INTERVENTION;  Surgeon: Fransisco Hertz, MD;  Location: [facility] INVASIVE CV LAB;  Service: Cardiovascular;;  LEFT        Home Medications    Prior to Admission medications   Medication Sig Start Date End Date Taking? Authorizing Provider  apixaban (ELIQUIS) 5 MG TABS tablet Take 1 tablet (5 mg total) by mouth 2 (two) times daily. 07/01/18   Iran Ouch, MD  atorvastatin (LIPITOR) 40 MG tablet Take 1 tablet (40 mg total) by mouth daily at 6 PM. 07/01/18   Iran Ouch, MD  benazepril (LOTENSIN) 10 MG tablet Take 1 tablet (10 mg total) by mouth daily. 07/01/18   Iran Ouch, MD  buPROPion (WELLBUTRIN SR) 200 MG 12 hr tablet Take 200 mg by mouth every morning. 12/11/17   [provider]  clopidogrel (PLAVIX) 75 MG tablet Take 1 tablet (75 mg total) by mouth daily. 07/01/18  Iran OuchArida, Muhammad A, MD  gabapentin (NEURONTIN) 300 MG capsule Take 1 capsule (300 mg total) by mouth 3 (three) times daily. 10/07/17   Nada LibmanBrabham, Vance W, MD  glimepiride (AMARYL) 4 MG tablet Take 1 tablet (4 mg total) by mouth daily with breakfast. 03/19/16   Jodelle GrossLawrence, Kathryn M, NP  HYDROcodone-acetaminophen (NORCO/VICODIN) 5-325 MG tablet Take 1 tablet by mouth every 6 (six) hours as needed for moderate pain. 05/29/17   Lars Mageollins, Emma M, PA-C  levothyroxine (SYNTHROID, LEVOTHROID) 50 MCG tablet Take 50 mcg by mouth daily before breakfast.     [provider]  metoprolol tartrate (LOPRESSOR) 50 MG tablet Take 1 tablet (50 mg total) by mouth 2 (two) times daily. 07/01/18   Iran OuchArida, Muhammad A, MD  polyethylene  glycol-electrolytes (TRILYTE) 420 g solution Take 4,000 mLs by mouth as directed. 08/11/18   Rourk, Gerrit Friendsobert M, MD  sertraline (ZOLOFT) 50 MG tablet Take 50 mg by mouth at bedtime. 12/11/17   [provider]  sitaGLIPtin-metformin (JANUMET) 50-1000 MG tablet Take 1 tablet by mouth 2 (two) times daily.    [provider]    Family History Family History  Problem Relation Age of Onset   Coronary artery disease Father        CABG in early 6950's   Heart attack Father    Heart disease Father    Cancer Mother    Heart disease Mother    Heart disease Brother    Diabetes Maternal Grandmother    SIDS Sister    Colon cancer Neg Hx     Social History Social History   Tobacco Use   Smoking status: Current Every Day Smoker    Packs/day: 0.50    Years: 35.00    Pack years: 17.50    Types: Cigarettes   Smokeless tobacco: Never Used   Tobacco comment: 2-3 cigarettes per day  Substance Use Topics   Alcohol use: No   Drug use: No     Allergies   Ciprofloxacin   Review of Systems Review of Systems  Respiratory: Negative for shortness of breath.   Cardiovascular: Negative for chest pain.  Gastrointestinal: Negative for abdominal pain.  Neurological: Negative for headaches.  All other systems reviewed and are negative.    Physical Exam Updated Vital Signs BP 92/68    Pulse 62    Temp 98.3 F (36.8 C) (Oral)    Resp (!) 23    Ht 6\' 1"  (1.854 m)    Wt 78.9 kg    SpO2 97%    BMI 22.96 kg/m   Physical Exam Vitals signs and nursing note reviewed.  Constitutional:      Appearance: He is well-developed.  HENT:     Head: Normocephalic and atraumatic.     Mouth/Throat:     Mouth: Mucous membranes are moist.  Eyes:     Conjunctiva/sclera: Conjunctivae normal.  Neck:     Musculoskeletal: Neck supple.  Cardiovascular:     Rate and Rhythm: Normal rate.     Heart sounds: No murmur.  Pulmonary:     Effort: Pulmonary effort is normal. No respiratory  distress.     Breath sounds: No stridor. No wheezing or rhonchi.  Abdominal:     Palpations: Abdomen is soft.     Tenderness: There is no abdominal tenderness.  Musculoskeletal:     Right lower leg: No edema.     Comments: Trace lower lower extremity edema when compared with right Unable to palpate left pedal pulse dorsalis  pedis or posterior pedal, not able to Doppler left pedal pulse or posterior pedal, popliteal pulse palpable and dopplerable.  Right lower extremity pulses intact Left lower extremity is discolored, mottled, blue-purple discoloration, minimal swelling when compared with right Sensation intact left foot but diminished Motor strength diminished, however patient is still able to move left toes  Skin:    General: Skin is warm and dry.  Neurological:     Mental Status: He is alert.      ED Treatments / Results  Labs (all labs ordered are listed, but only abnormal results are displayed) Labs Reviewed  CBC WITH DIFFERENTIAL/PLATELET - Abnormal; Notable for the following components:      Result Value   WBC 15.0 (*)    Hemoglobin 12.9 (*)    Neutro Abs 10.2 (*)    Monocytes Absolute 1.6 (*)    Abs Immature Granulocytes 0.09 (*)    All other components within normal limits  BASIC METABOLIC PANEL - Abnormal; Notable for the following components:   CO2 21 (*)    Glucose, Bld 194 (*)    All other components within normal limits  PROTIME-INR - Abnormal; Notable for the following components:   Prothrombin Time 15.9 (*)    INR 1.3 (*)    All other components within normal limits  SARS CORONAVIRUS 2 ([facility] ORDER, [place] [facility])    EKG None  Radiology Dg Ankle 2 Views Left  Result Date: 08/14/2018 CLINICAL DATA:  Trauma to left foot. EXAM: LEFT ANKLE - 2 VIEW COMPARISON:  None. FINDINGS: There is no acute displaced fracture or dislocation. There is a moderate-sized plantar calcaneal spur. There is an old fragment adjacent to the  talonavicular joint likely representing sequela of an old remote injury. Vascular calcifications are noted. IMPRESSION: No acute osseous abnormality. Electronically Signed   By: Constance Holster M.D.   On: 08/14/2018 20:56   Dg Foot 2 Views Left  Result Date: 08/14/2018 CLINICAL DATA:  Pain EXAM: LEFT FOOT - 2 VIEW COMPARISON:  None. FINDINGS: There is soft tissue swelling about the head of the fifth metacarpal without evidence of underlying fracture. Vascular calcifications are noted. There is some soft tissue swelling about the forefoot. A plantar calcaneal spur is noted. There is a chronic appearing deformity at the talonavicular joint likely representing sequela of an old remote injury. IMPRESSION: 1. No acute osseous abnormality. 2. Nonspecific soft tissue swelling about the head of the fifth metacarpal. Correlation with physical exam is recommended. 3. Probable old fracture deformity at the talonavicular joint. Correlation with point tenderness is recommended. Electronically Signed   By: Constance Holster M.D.   On: 08/14/2018 20:58   Hybrid Or Imaging (mc Only)  Result Date: 08/14/2018 There is no interpretation for this exam.  This order is for images obtained during a surgical procedure.  Please See "Surgeries" Tab for more information regarding the procedure.    Procedures Procedures (including critical care time)  Medications Ordered in ED Medications  0.9 % irrigation (POUR BTL) (1,000 mLs Irrigation Given 08/14/18 2228)  70 mL Visipaque 320 mg/mL mixed with 0.9% normal saline (30 mL) injection (1 mL Injection Given 08/14/18 2228)  heparin 6,000 Units in sodium chloride 0.9 % 500 mL irrigation (50 mLs Irrigation Given 08/14/18 2229)  propofol (DIPRIVAN) 10 mg/mL bolus/IV push (has no administration in time range)  midazolam (VERSED) 2 MG/2ML injection (has no administration in time range)  fentaNYL (SUBLIMAZE) 250 MCG/5ML injection (has no administration in time  range)    succinylcholine (ANECTINE) 200 MG/10ML syringe (has no administration in time range)  lidocaine 20 MG/ML injection (has no administration in time range)  rocuronium bromide 100 MG/10ML SOSY (has no administration in time range)  lidocaine (PF) (XYLOCAINE) 1 % injection (has no administration in time range)  sodium chloride 0.9 % with heparin ADS Med (has no administration in time range)     Initial Impression / Assessment and Plan / ED Course  I have reviewed the triage vital signs and the nursing notes.  Pertinent labs & imaging results that were available during my care of the patient were reviewed by me and considered in my medical decision making (see chart for details).      Medical Decision Making: MICHEIL KLAUS is a 58 y.o. male who presented to the ED today with left foot pain, discoloration.  Past medical history significant for  carotid disease, atrial fibrillation on Eliquis, diabetes, dyslipidemia, tobacco abuse, severe peripheral arterial disease  Right femoropopliteal orbital atherectomy with drug-coated angioplasty in 2018 Left he has had a fem pop bypass with PTFE that has occluded multiple times and underwent most recent salvage last year with attempt at thrombolysis and complex endo intervention. Reviewed and confirmed nursing documentation for past medical history, family history, social history.  On my initial exam, the pt was appears uncomfortable, not tachycardic, not hypotensive, no increased work of breathing or respiratory distress, on room air, afebrile, GCS 15, alert and mentating appropriately.  Unable to palpate left pedal pulse dorsalis pedis or posterior pedal, not able to Doppler left pedal pulse or posterior pedal, popliteal pulse palpable and dopplerable.  Right lower extremity pulses intact Left lower extremity is discolored, mottled, blue-purple discoloration, minimal swelling when compared with right Sensation intact left foot but diminished Motor  strength diminished, however patient is still able to move left toes Concern for acute arterial occlusive disease secondary to trauma few days ago, will obtain plain films to assess for traumatic injury, vascular surgery consulted Vascular surgery assessed patient, unable to obtain pulse, will take patient to the operating room for further management All radiology and laboratory studies reviewed independently and with my attending physician, agree with reading provided by radiologist unless otherwise noted.  Upon reassessing patient, patient was calm, no new complaints, awaiting transport to the OR Patient taken the OR The above care was discussed with and agreed upon by my attending physician. Emergency Department Medication Summary:  Medications  0.9 % irrigation (POUR BTL) (1,000 mLs Irrigation Given 08/14/18 2228)  70 mL Visipaque 320 mg/mL mixed with 0.9% normal saline (30 mL) injection (1 mL Injection Given 08/14/18 2228)  heparin 6,000 Units in sodium chloride 0.9 % 500 mL irrigation (50 mLs Irrigation Given 08/14/18 2229)  propofol (DIPRIVAN) 10 mg/mL bolus/IV push (has no administration in time range)  midazolam (VERSED) 2 MG/2ML injection (has no administration in time range)  fentaNYL (SUBLIMAZE) 250 MCG/5ML injection (has no administration in time range)  succinylcholine (ANECTINE) 200 MG/10ML syringe (has no administration in time range)  lidocaine 20 MG/ML injection (has no administration in time range)  rocuronium bromide 100 MG/10ML SOSY (has no administration in time range)  lidocaine (PF) (XYLOCAINE) 1 % injection (has no administration in time range)  sodium chloride 0.9 % with heparin ADS Med (has no administration in time range)   Final Clinical Impressions(s) / ED Diagnoses   Final diagnoses:  None    ED Discharge Orders    None  Erick Alleyasey, Jacquetta Polhamus, MD 08/14/18 [id number]    Derwood KaplanNanavati, Ankit, MD 08/15/18 [phone]

## 2018-08-15 ENCOUNTER — Inpatient Hospital Stay (HOSPITAL_COMMUNITY)
Admission: EM | Disposition: A | Discharge status: Home / Self Care | Payer: Self-pay | Source: Home / Self Care | Attending: Vascular Surgery

## 2018-08-15 ENCOUNTER — Encounter (HOSPITAL_COMMUNITY): Payer: Self-pay | Admitting: Vascular Surgery

## 2018-08-15 DIAGNOSIS — Z881 Allergy status to other antibiotic agents status: Secondary | ICD-10-CM | POA: Diagnosis not present

## 2018-08-15 DIAGNOSIS — Z7984 Long term (current) use of oral hypoglycemic drugs: Secondary | ICD-10-CM | POA: Diagnosis not present

## 2018-08-15 DIAGNOSIS — Y832 Surgical operation with anastomosis, bypass or graft as the cause of abnormal reaction of the patient, or of later complication, without mention of misadventure at the time of the procedure: Secondary | ICD-10-CM | POA: Diagnosis present

## 2018-08-15 DIAGNOSIS — I6523 Occlusion and stenosis of bilateral carotid arteries: Secondary | ICD-10-CM | POA: Diagnosis present

## 2018-08-15 DIAGNOSIS — I2510 Atherosclerotic heart disease of native coronary artery without angina pectoris: Secondary | ICD-10-CM | POA: Diagnosis present

## 2018-08-15 DIAGNOSIS — Z95820 Peripheral vascular angioplasty status with implants and grafts: Secondary | ICD-10-CM | POA: Diagnosis not present

## 2018-08-15 DIAGNOSIS — Z79899 Other long term (current) drug therapy: Secondary | ICD-10-CM | POA: Diagnosis not present

## 2018-08-15 DIAGNOSIS — F17210 Nicotine dependence, cigarettes, uncomplicated: Secondary | ICD-10-CM | POA: Diagnosis present

## 2018-08-15 DIAGNOSIS — T82858A Stenosis of vascular prosthetic devices, implants and grafts, initial encounter: Secondary | ICD-10-CM | POA: Diagnosis present

## 2018-08-15 DIAGNOSIS — Z8249 Family history of ischemic heart disease and other diseases of the circulatory system: Secondary | ICD-10-CM | POA: Diagnosis not present

## 2018-08-15 DIAGNOSIS — Z7902 Long term (current) use of antithrombotics/antiplatelets: Secondary | ICD-10-CM | POA: Diagnosis not present

## 2018-08-15 DIAGNOSIS — Z833 Family history of diabetes mellitus: Secondary | ICD-10-CM | POA: Diagnosis not present

## 2018-08-15 DIAGNOSIS — Z87442 Personal history of urinary calculi: Secondary | ICD-10-CM | POA: Diagnosis not present

## 2018-08-15 DIAGNOSIS — Z1159 Encounter for screening for other viral diseases: Secondary | ICD-10-CM | POA: Diagnosis not present

## 2018-08-15 DIAGNOSIS — E785 Hyperlipidemia, unspecified: Secondary | ICD-10-CM | POA: Diagnosis present

## 2018-08-15 DIAGNOSIS — I70222 Atherosclerosis of native arteries of extremities with rest pain, left leg: Secondary | ICD-10-CM | POA: Diagnosis not present

## 2018-08-15 DIAGNOSIS — I482 Chronic atrial fibrillation, unspecified: Secondary | ICD-10-CM | POA: Diagnosis present

## 2018-08-15 DIAGNOSIS — Z79890 Hormone replacement therapy (postmenopausal): Secondary | ICD-10-CM | POA: Diagnosis not present

## 2018-08-15 DIAGNOSIS — I998 Other disorder of circulatory system: Secondary | ICD-10-CM | POA: Diagnosis present

## 2018-08-15 DIAGNOSIS — E1151 Type 2 diabetes mellitus with diabetic peripheral angiopathy without gangrene: Secondary | ICD-10-CM | POA: Diagnosis present

## 2018-08-15 DIAGNOSIS — Z7901 Long term (current) use of anticoagulants: Secondary | ICD-10-CM | POA: Diagnosis not present

## 2018-08-15 DIAGNOSIS — I743 Embolism and thrombosis of arteries of the lower extremities: Secondary | ICD-10-CM | POA: Diagnosis present

## 2018-08-15 DIAGNOSIS — I824Z9 Acute embolism and thrombosis of unspecified deep veins of unspecified distal lower extremity: Secondary | ICD-10-CM | POA: Diagnosis present

## 2018-08-15 DIAGNOSIS — I10 Essential (primary) hypertension: Secondary | ICD-10-CM | POA: Diagnosis present

## 2018-08-15 HISTORY — PX: PERIPHERAL VASCULAR BALLOON ANGIOPLASTY: CATH118281

## 2018-08-15 HISTORY — PX: ABDOMINAL AORTOGRAM W/LOWER EXTREMITY: CATH118223

## 2018-08-15 LAB — CBC
HCT: 37.2 % — ABNORMAL LOW (ref 39.0–52.0)
HCT: 28.9 % — ABNORMAL LOW (ref 39.0–52.0)
Hemoglobin: 11.9 g/dL — ABNORMAL LOW (ref 13.0–17.0)
Hemoglobin: 9.4 g/dL — ABNORMAL LOW (ref 13.0–17.0)
MCH: 28.9 pg (ref 26.0–34.0)
MCH: 29.9 pg (ref 26.0–34.0)
MCHC: 32 g/dL (ref 30.0–36.0)
MCHC: 32.5 g/dL (ref 30.0–36.0)
MCV: 90.3 fL (ref 80.0–100.0)
MCV: 92 fL (ref 80.0–100.0)
Platelets: 143 10*3/uL — ABNORMAL LOW (ref 150–400)
Platelets: 120 10*3/uL — ABNORMAL LOW (ref 150–400)
RBC: 4.12 MIL/uL — ABNORMAL LOW (ref 4.22–5.81)
RBC: 3.14 MIL/uL — ABNORMAL LOW (ref 4.22–5.81)
RDW: 16.1 % — ABNORMAL HIGH (ref 11.5–15.5)
RDW: 16.2 % — ABNORMAL HIGH (ref 11.5–15.5)
WBC: 14 10*3/uL — ABNORMAL HIGH (ref 4.0–10.5)
WBC: 10.5 10*3/uL (ref 4.0–10.5)
nRBC: 0 % (ref 0.0–0.2)
nRBC: 0 % (ref 0.0–0.2)

## 2018-08-15 LAB — FIBRINOGEN
Fibrinogen: 510 mg/dL — ABNORMAL HIGH (ref 210–475)
Fibrinogen: 572 mg/dL — ABNORMAL HIGH (ref 210–475)

## 2018-08-15 LAB — BASIC METABOLIC PANEL
Anion gap: 9 (ref 5–15)
BUN: 13 mg/dL (ref 6–20)
CO2: 21 mmol/L — ABNORMAL LOW (ref 22–32)
Calcium: 9 mg/dL (ref 8.9–10.3)
Chloride: 108 mmol/L (ref 98–111)
Creatinine, Ser: 0.85 mg/dL (ref 0.61–1.24)
GFR calc Af Amer: >60 mL/min (ref >60)
GFR calc non Af Amer: >60 mL/min (ref >60)
Glucose, Bld: 149 mg/dL — ABNORMAL HIGH (ref 70–99)
Potassium: 3.9 mmol/L (ref 3.5–5.1)
Sodium: 138 mmol/L (ref 135–145)

## 2018-08-15 LAB — POCT ACTIVATED CLOTTING TIME
Activated Clotting Time: 219 seconds
Activated Clotting Time: 230 seconds

## 2018-08-15 LAB — GLUCOSE, CAPILLARY
Glucose-Capillary: 105 mg/dL — ABNORMAL HIGH (ref 70–99)
Glucose-Capillary: 110 mg/dL — ABNORMAL HIGH (ref 70–99)
Glucose-Capillary: 136 mg/dL — ABNORMAL HIGH (ref 70–99)
Glucose-Capillary: 155 mg/dL — ABNORMAL HIGH (ref 70–99)

## 2018-08-15 LAB — HEPARIN LEVEL (UNFRACTIONATED)
Heparin Unfractionated: 0.87 IU/mL — ABNORMAL HIGH (ref 0.30–0.70)
Heparin Unfractionated: 0.48 IU/mL (ref 0.30–0.70)

## 2018-08-15 LAB — MRSA PCR SCREENING: MRSA by PCR: NEGATIVE

## 2018-08-15 SURGERY — ABDOMINAL AORTOGRAM W/LOWER EXTREMITY
Anesthesia: LOCAL

## 2018-08-15 MED ORDER — SODIUM CHLORIDE 0.9 % IV SOLN
250.0000 mL | INTRAVENOUS | Status: DC | PRN
  Administered 2018-08-16: 250 mL via INTRAVENOUS

## 2018-08-15 MED ORDER — ONDANSETRON HCL 4 MG/2ML IJ SOLN: 4.0000 mg | Freq: Four times a day (QID) | INTRAMUSCULAR | Status: DC | PRN

## 2018-08-15 MED ORDER — ASPIRIN EC 81 MG PO TBEC
81.0000 mg | DELAYED_RELEASE_TABLET | Freq: Every day | ORAL | Status: DC
  Administered 2018-08-16: 81 mg via ORAL
  Filled 2018-08-15: qty 1

## 2018-08-15 MED ORDER — NITROGLYCERIN 1 MG/10 ML FOR IR/CATH LAB
INTRA_ARTERIAL | Status: DC | PRN
  Administered 2018-08-15: 800 ug via INTRA_ARTERIAL

## 2018-08-15 MED ORDER — HEPARIN SODIUM (PORCINE) 1000 UNIT/ML IJ SOLN
INTRAMUSCULAR | Status: AC
  Filled 2018-08-15: qty 1

## 2018-08-15 MED ORDER — BENAZEPRIL HCL 10 MG PO TABS
10.0000 mg | ORAL_TABLET | Freq: Every day | ORAL | Status: DC
  Administered 2018-08-15 – 2018-08-16 (×2): 10 mg via ORAL
  Filled 2018-08-15 (×2): qty 1

## 2018-08-15 MED ORDER — HYDRALAZINE HCL 20 MG/ML IJ SOLN: 5.0000 mg | INTRAMUSCULAR | Status: DC | PRN

## 2018-08-15 MED ORDER — LIDOCAINE HCL (PF) 1 % IJ SOLN
INTRAMUSCULAR | Status: AC
  Filled 2018-08-15: qty 30

## 2018-08-15 MED ORDER — SERTRALINE HCL 50 MG PO TABS
50.0000 mg | ORAL_TABLET | Freq: Every day | ORAL | Status: DC
  Administered 2018-08-15: 50 mg via ORAL
  Filled 2018-08-15: qty 1

## 2018-08-15 MED ORDER — HEPARIN (PORCINE) 25000 UT/250ML-% IV SOLN
500.0000 [IU]/h | INTRAVENOUS | Status: DC
  Administered 2018-08-15: 500 [IU]/h via INTRAVENOUS
  Filled 2018-08-15: qty 250

## 2018-08-15 MED ORDER — SODIUM CHLORIDE 0.9 % IV SOLN
INTRAVENOUS | Status: DC
  Administered 2018-08-15: 07:00:00 via INTRAVENOUS

## 2018-08-15 MED ORDER — LEVOTHYROXINE SODIUM 50 MCG PO TABS
50.0000 ug | ORAL_TABLET | Freq: Every day | ORAL | Status: DC
  Administered 2018-08-15 – 2018-08-16 (×2): 50 ug via ORAL
  Filled 2018-08-15 (×2): qty 1

## 2018-08-15 MED ORDER — INSULIN ASPART 100 UNIT/ML ~~LOC~~ SOLN: 4.0000 [IU] | Freq: Three times a day (TID) | SUBCUTANEOUS | Status: DC

## 2018-08-15 MED ORDER — FENTANYL CITRATE (PF) 100 MCG/2ML IJ SOLN: 25.0000 ug | INTRAMUSCULAR | Status: DC | PRN

## 2018-08-15 MED ORDER — INSULIN ASPART 100 UNIT/ML ~~LOC~~ SOLN
0.0000 [IU] | Freq: Three times a day (TID) | SUBCUTANEOUS | Status: DC
  Administered 2018-08-15 – 2018-08-16 (×2): 3 [IU] via SUBCUTANEOUS

## 2018-08-15 MED ORDER — ASPIRIN EC 81 MG PO TBEC: 81.0000 mg | DELAYED_RELEASE_TABLET | Freq: Every day | ORAL | Status: DC

## 2018-08-15 MED ORDER — MIDAZOLAM HCL 2 MG/2ML IJ SOLN: 1.0000 mg | INTRAMUSCULAR | Status: DC | PRN

## 2018-08-15 MED ORDER — SODIUM CHLORIDE 0.9 % IV SOLN
1.0000 mg/h | INTRAVENOUS | Status: DC
  Administered 2018-08-15 (×2): 1 mg/h
  Filled 2018-08-15 (×6): qty 10

## 2018-08-15 MED ORDER — NITROGLYCERIN 1 MG/10 ML FOR IR/CATH LAB
INTRA_ARTERIAL | Status: AC
  Filled 2018-08-15: qty 10

## 2018-08-15 MED ORDER — INSULIN ASPART 100 UNIT/ML ~~LOC~~ SOLN: 0.0000 [IU] | Freq: Every day | SUBCUTANEOUS | Status: DC

## 2018-08-15 MED ORDER — MIDAZOLAM HCL 2 MG/2ML IJ SOLN
INTRAMUSCULAR | Status: AC
  Filled 2018-08-15: qty 2

## 2018-08-15 MED ORDER — SODIUM CHLORIDE 0.9% FLUSH
3.0000 mL | Freq: Two times a day (BID) | INTRAVENOUS | Status: DC
  Administered 2018-08-16 (×2): 3 mL via INTRAVENOUS

## 2018-08-15 MED ORDER — CLOPIDOGREL BISULFATE 75 MG PO TABS: 75.0000 mg | ORAL_TABLET | Freq: Every day | ORAL | Status: DC

## 2018-08-15 MED ORDER — BUPROPION HCL ER (SR) 100 MG PO TB12
200.0000 mg | ORAL_TABLET | ORAL | Status: DC
  Administered 2018-08-15 – 2018-08-16 (×2): 200 mg via ORAL
  Filled 2018-08-15 (×2): qty 2

## 2018-08-15 MED ORDER — FENTANYL CITRATE (PF) 100 MCG/2ML IJ SOLN
INTRAMUSCULAR | Status: DC | PRN
  Administered 2018-08-15: 25 ug via INTRAVENOUS
  Administered 2018-08-15: 50 ug via INTRAVENOUS

## 2018-08-15 MED ORDER — SODIUM CHLORIDE 0.9% FLUSH: 3.0000 mL | INTRAVENOUS | Status: DC | PRN

## 2018-08-15 MED ORDER — HEPARIN (PORCINE) IN NACL 1000-0.9 UT/500ML-% IV SOLN
INTRAVENOUS | Status: DC | PRN
  Administered 2018-08-15 (×2): 500 mL

## 2018-08-15 MED ORDER — SODIUM CHLORIDE 0.9 % IV SOLN
1.0000 mg/h | INTRAVENOUS | Status: DC
  Filled 2018-08-15: qty 10

## 2018-08-15 MED ORDER — MIDAZOLAM HCL 2 MG/2ML IJ SOLN
INTRAMUSCULAR | Status: DC | PRN
  Administered 2018-08-15: 2 mg via INTRAVENOUS
  Administered 2018-08-15: 1 mg via INTRAVENOUS

## 2018-08-15 MED ORDER — CHLORHEXIDINE GLUCONATE CLOTH 2 % EX PADS: 6.0000 | MEDICATED_PAD | Freq: Every day | CUTANEOUS | Status: DC

## 2018-08-15 MED ORDER — HEPARIN (PORCINE) IN NACL 1000-0.9 UT/500ML-% IV SOLN
INTRAVENOUS | Status: AC
  Filled 2018-08-15: qty 500

## 2018-08-15 MED ORDER — LIDOCAINE HCL 1 % IJ SOLN
INTRAMUSCULAR | Status: DC | PRN
  Administered 2018-08-15: 10 mL

## 2018-08-15 MED ORDER — HEPARIN (PORCINE) 25000 UT/250ML-% IV SOLN
600.0000 [IU]/h | INTRAVENOUS | Status: DC
  Administered 2018-08-15: 600 [IU]/h via INTRAVENOUS

## 2018-08-15 MED ORDER — FENTANYL CITRATE (PF) 100 MCG/2ML IJ SOLN
INTRAMUSCULAR | Status: AC
  Filled 2018-08-15: qty 2

## 2018-08-15 MED ORDER — SODIUM CHLORIDE 0.9 % IV SOLN: 250.0000 mL | INTRAVENOUS | Status: DC | PRN

## 2018-08-15 MED ORDER — LABETALOL HCL 5 MG/ML IV SOLN
10.0000 mg | INTRAVENOUS | Status: DC | PRN
  Filled 2018-08-15: qty 4

## 2018-08-15 MED ORDER — HEPARIN SODIUM (PORCINE) 1000 UNIT/ML IJ SOLN
INTRAMUSCULAR | Status: DC | PRN
  Administered 2018-08-15: 3000 [IU] via INTRAVENOUS
  Administered 2018-08-15: 5000 [IU] via INTRAVENOUS

## 2018-08-15 MED ORDER — ATORVASTATIN CALCIUM 40 MG PO TABS
40.0000 mg | ORAL_TABLET | Freq: Every day | ORAL | Status: DC
  Administered 2018-08-15: 40 mg via ORAL
  Filled 2018-08-15: qty 1

## 2018-08-15 MED ORDER — GABAPENTIN 300 MG PO CAPS
300.0000 mg | ORAL_CAPSULE | Freq: Three times a day (TID) | ORAL | Status: DC
  Administered 2018-08-15 – 2018-08-16 (×3): 300 mg via ORAL
  Filled 2018-08-15 (×3): qty 1

## 2018-08-15 MED ORDER — METOPROLOL TARTRATE 50 MG PO TABS
50.0000 mg | ORAL_TABLET | Freq: Two times a day (BID) | ORAL | Status: DC
  Administered 2018-08-15 – 2018-08-16 (×3): 50 mg via ORAL
  Filled 2018-08-15 (×3): qty 1

## 2018-08-15 MED ORDER — MORPHINE SULFATE (PF) 4 MG/ML IV SOLN
5.0000 mg | INTRAVENOUS | Status: DC | PRN
  Administered 2018-08-15: 5 mg via INTRAVENOUS
  Administered 2018-08-15 (×2): 4 mg via INTRAVENOUS
  Filled 2018-08-15 (×3): qty 2

## 2018-08-15 MED ORDER — ASPIRIN 81 MG PO CHEW
CHEWABLE_TABLET | ORAL | Status: AC
  Administered 2018-08-15: 81 mg
  Filled 2018-08-15: qty 1

## 2018-08-15 MED ORDER — APIXABAN 5 MG PO TABS
5.0000 mg | ORAL_TABLET | Freq: Two times a day (BID) | ORAL | Status: DC
  Filled 2018-08-15: qty 1

## 2018-08-15 MED ORDER — ACETAMINOPHEN 325 MG PO TABS: 650.0000 mg | ORAL_TABLET | ORAL | Status: DC | PRN

## 2018-08-15 MED ORDER — SODIUM CHLORIDE 0.9 % WEIGHT BASED INFUSION
1.0000 mL/kg/h | INTRAVENOUS | Status: AC
  Administered 2018-08-15: 1 mL/kg/h via INTRAVENOUS

## 2018-08-15 MED ORDER — CLOPIDOGREL BISULFATE 75 MG PO TABS
75.0000 mg | ORAL_TABLET | Freq: Every day | ORAL | Status: DC
  Administered 2018-08-15 – 2018-08-16 (×2): 75 mg via ORAL
  Filled 2018-08-15 (×2): qty 1

## 2018-08-15 SURGICAL SUPPLY — 19 items
BALLN COYOTE OTW 3X220X150 (BALLOONS) ×3
BALLN STERLING OTW 5X40X135 (BALLOONS) ×3
BALLOON COYOTE OTW 3X220X150 (BALLOONS) IMPLANT
BALLOON STERLING OTW 5X40X135 (BALLOONS) IMPLANT
CANISTER PENUMBRA ENGINE (MISCELLANEOUS) ×1 IMPLANT
CATH INDIGO CAT6 KIT (CATHETERS) ×1 IMPLANT
CATH QUICKCROSS .035X135CM (MICROCATHETER) ×1 IMPLANT
CLOSURE MYNX CONTROL 6F/7F (Vascular Products) ×1 IMPLANT
GLIDEWIRE ADV .035X260CM (WIRE) ×1 IMPLANT
KIT ENCORE 26 ADVANTAGE (KITS) ×1 IMPLANT
KIT PV (KITS) ×3 IMPLANT
SHEATH PINNACLE 7F 10CM (SHEATH) ×1 IMPLANT
SHEATH PINNACLE ST 7F 45CM (SHEATH) ×1 IMPLANT
SHIELD RADPAD SCOOP 12X17 (MISCELLANEOUS) ×2 IMPLANT
TRANSDUCER W/STOPCOCK (MISCELLANEOUS) ×3 IMPLANT
TRAY PV CATH (CUSTOM PROCEDURE TRAY) ×3 IMPLANT
WIRE HI TORQ VERSACORE J 260CM (WIRE) ×1 IMPLANT
WIRE ROSEN-J .035X180CM (WIRE) ×1 IMPLANT
WIRE SPARTACORE .014X300CM (WIRE) ×1 IMPLANT

## 2018-08-15 NOTE — Progress Notes (Signed)
This RN paged Dr. Donnetta Hutching regarding clarification on pt's evening dose of Eliquis. 5mg  dose of Eliquis was due for 2200, but the [facility] admin instructions stated "First dose 6-27". Dr. Donnetta Hutching gave orders to hold tonight's dose of Eliquis and restart pt on 600 units/hr IV heparin.

## 2018-08-15 NOTE — Anesthesia Postprocedure Evaluation (Signed)
Anesthesia Post Note  Patient: [facility]  Procedure(s) Performed: Left LOWER EXTREMITY ANGIOGRAM, Lysis Catheter Placement (Left Groin)     Patient location during evaluation: PACU Anesthesia Type: MAC Level of consciousness: awake Pain management: pain level controlled Vital Signs Assessment: post-procedure vital signs reviewed and stable Cardiovascular status: stable Postop Assessment: no apparent nausea or vomiting Anesthetic complications: no    Last Vitals:  Vitals:   08/14/18 2200 08/15/18 0039  BP: 92/68 106/67  Pulse: 62 80  Resp: (!) 23 18  Temp:  36.7 C  SpO2: 97% 98%    Last Pain:  Vitals:   08/14/18 1952  TempSrc: Oral  PainSc:                  Prescilla Monger

## 2018-08-15 NOTE — Interval H&P Note (Signed)
History and Physical Interval Note:  08/15/2018 1:30 PM  Willie Morales  has presented today for surgery, with the diagnosis of lysis.  The various methods of treatment have been discussed with the patient and family. After consideration of risks, benefits and other options for treatment, the patient has consented to  Procedure(s): ABDOMINAL AORTOGRAM W/LOWER EXTREMITY (N/A) as a surgical intervention.  The patient's history has been reviewed, patient examined, no change in status, stable for surgery.  I have reviewed the patient's chart and labs.  Questions were answered to the patient's satisfaction.     Deitra Mayo

## 2018-08-15 NOTE — Op Note (Signed)
   PATIENT: [facility]      MRN: [id number] DOB: 1960-07-08    DATE OF PROCEDURE: 08/15/2018  INDICATIONS:    [name] is a 58 y.o. male with a complicated past surgical history.  He had undergone multiple previous procedures by Dr. Adele Barthel including, angioplasty and stenting of the left superficial femoral artery, left iliofemoral endarterectomy and left femoral to below-knee popliteal artery bypass with PTFE.  His graft is occluded multiple times and has had multiple previous operations.  Most recently, in December 2019 he had mechanical thrombectomy of his occluded left femoropopliteal bypass and balloon angioplasty of the left popliteal, tibial peroneal trunk, posterior tibial, and peroneal arteries.  He had coronary stents placed in the left posterior tibial artery, left peroneal artery, left tibial peroneal trunk, and also of the left common iliac artery and external iliac artery.  This all occluded and he presented with an ischemic foot yesterday and underwent placement of a thrombolytic catheter.  He presents for follow-up study today.  Of note it has been explained to him that he is certainly running out of options and that he may ultimately require a primary amputation.  PROCEDURE:    1.  Follow-up left lower extremity arteriogram  SURGEON: Judeth Cornfield. Scot Dock, MD, FACS  ANESTHESIA: Local  EBL: Minimal  TECHNIQUE: The patient was brought to the peripheral vascular lab.  He was prepped and draped in usual sterile fashion.  The catheter had been placed in the left posterior tibial artery from the right femoral artery.  I anesthetized the right groin.  I advanced a Bentson wire through the infusion catheter down into the posterior tibial artery and then remove the infusion catheter.  I then shot arteriogram evaluating the femoropopliteal graft and runoff through the sheath.  There was still residual clot within the femoropopliteal bypass graft and the tibials could not be  identified.  I therefore advanced a quick cross catheter over the wire and positioned this in the posterior tibial artery.  Using 2 hand injections we can demonstrate that the posterior tibial artery was patent.  It was felt that the patient would potentially benefit from mechanical thrombectomy and angioplasty of the tibial disease and therefore at this point I asked for Dr. Stephens Shire assistance.  The remainder of the dictation is as per Dr. Trula Slade.   Deitra Mayo, MD, [facility] [place] DICTATION:   08/15/2018

## 2018-08-15 NOTE — Progress Notes (Signed)
  Progress Note    08/15/2018 8:13 AM 1 Day Post-Op  Subjective: No overnight issues  Vitals:   08/15/18 0800 08/15/18 0811  BP: (!) 192/108   Pulse: 77 92  Resp: (!) 33 (!) 23  Temp:    SpO2: 91% (!) 85%    Physical Exam: Awake alert oriented Nonlabored respirations Right groin is soft sheath in place There is no palpable popliteal pulse on the left No signals in the left foot and the left foot appears mottled and cool at the same level of sensorimotor dysfunction has preop  CBC    Component Value Date/Time   WBC 10.5 08/15/2018 0604   RBC 3.14 (L) 08/15/2018 0604   HGB 9.4 (L) 08/15/2018 0604   HCT 28.9 (L) 08/15/2018 0604   PLT 120 (L) 08/15/2018 0604   MCV 92.0 08/15/2018 0604   MCH 29.9 08/15/2018 0604   MCHC 32.5 08/15/2018 0604   RDW 16.2 (H) 08/15/2018 0604   LYMPHSABS 2.8 08/14/2018 1954   MONOABS 1.6 (H) 08/14/2018 1954   EOSABS 0.2 08/14/2018 1954   BASOSABS 0.1 08/14/2018 1954    BMET    Component Value Date/Time   NA 137 08/14/2018 1954   K 3.6 08/14/2018 1954   CL 106 08/14/2018 1954   CO2 21 (L) 08/14/2018 1954   GLUCOSE 194 (H) 08/14/2018 1954   BUN 14 08/14/2018 1954   CREATININE 1.01 08/14/2018 1954   CALCIUM 9.2 08/14/2018 1954   GFRNONAA >60 08/14/2018 1954   GFRAA >60 08/14/2018 1954    INR    Component Value Date/Time   INR 1.3 (H) 08/14/2018 1954     Intake/Output Summary (Last 24 hours) at 08/15/2018 0813 Last data filed at 08/15/2018 0700 Gross per 24 hour  Intake 929.58 ml  Output 420 ml  Net 509.58 ml     Assessment/plan:  58 y.o. male is s/p placement of lytic catheter for occluded left femoral to popliteal artery bypass graft as well as occluded TP trunk, PT artery and peroneal artery stents on the left.  Plan is for lytic recheck today.  Patient remains at high risk for amputation during this hospitalization.   Brandon C. Donzetta Matters, MD [facility] [place] Office: [phone] Pager:  [phone]  08/15/2018 8:13 AM

## 2018-08-15 NOTE — H&P (View-Only) (Signed)
  Progress Note    08/15/2018 8:13 AM 1 Day Post-Op  Subjective: No overnight issues  Vitals:   08/15/18 0800 08/15/18 0811  BP: (!) 192/108   Pulse: 77 92  Resp: (!) 33 (!) 23  Temp:    SpO2: 91% (!) 85%    Physical Exam: Awake alert oriented Nonlabored respirations Right groin is soft sheath in place There is no palpable popliteal pulse on the left No signals in the left foot and the left foot appears mottled and cool at the same level of sensorimotor dysfunction has preop  CBC    Component Value Date/Time   WBC 10.5 08/15/2018 0604   RBC 3.14 (L) 08/15/2018 0604   HGB 9.4 (L) 08/15/2018 0604   HCT 28.9 (L) 08/15/2018 0604   PLT 120 (L) 08/15/2018 0604   MCV 92.0 08/15/2018 0604   MCH 29.9 08/15/2018 0604   MCHC 32.5 08/15/2018 0604   RDW 16.2 (H) 08/15/2018 0604   LYMPHSABS 2.8 08/14/2018 1954   MONOABS 1.6 (H) 08/14/2018 1954   EOSABS 0.2 08/14/2018 1954   BASOSABS 0.1 08/14/2018 1954    BMET    Component Value Date/Time   NA 137 08/14/2018 1954   K 3.6 08/14/2018 1954   CL 106 08/14/2018 1954   CO2 21 (L) 08/14/2018 1954   GLUCOSE 194 (H) 08/14/2018 1954   BUN 14 08/14/2018 1954   CREATININE 1.01 08/14/2018 1954   CALCIUM 9.2 08/14/2018 1954   GFRNONAA >60 08/14/2018 1954   GFRAA >60 08/14/2018 1954    INR    Component Value Date/Time   INR 1.3 (H) 08/14/2018 1954     Intake/Output Summary (Last 24 hours) at 08/15/2018 0813 Last data filed at 08/15/2018 0700 Gross per 24 hour  Intake 929.58 ml  Output 420 ml  Net 509.58 ml     Assessment/plan:  58 y.o. male is s/p placement of lytic catheter for occluded left femoral to popliteal artery bypass graft as well as occluded TP trunk, PT artery and peroneal artery stents on the left.  Plan is for lytic recheck today.  Patient remains at high risk for amputation during this hospitalization.   Nathan Moctezuma C. Denya Buckingham, MD [facility] [place] Office: [phone] Pager:  [phone]  08/15/2018 8:13 AM  

## 2018-08-15 NOTE — Progress Notes (Signed)
Patient refused bladder scan. He says what do you expects to find there when I have not drank anything in hourrs

## 2018-08-15 NOTE — Op Note (Signed)
Patient name: Willie Morales MRN: [id number] DOB: 1960/04/02 Sex: male  08/15/2018 Pre-operative Diagnosis: Follow-up lysis Post-operative diagnosis:  Same Surgeon:  Annamarie Major Procedure Performed:  1.  Mechanical thrombectomy using the penumbra CAT 6 device of the left femoral-popliteal bypass graft, tibioperoneal trunk, and posterior tibial artery  2.  Balloon angioplasty of the right posterior tibial, tibioperoneal trunk, popliteal, and femoral-popliteal bypass graft  3.  Intra-arterial administration of nitroglycerin  4.  Conscious sedation, greater than 60 minutes  5.  Mynx closure device     Indications: The patient has previously undergone a femoral-popliteal bypass graft with Gore-Tex.  This occluded about 6 months ago.  Blood flow was reestablished and he had stenting of his posterior tibial and peroneal arteries.  He came in last night with an ischemic leg and a lytics catheter was placed.  He received TPA overnight.  Dr. Scot Dock brought him back today for a follow-up study.  Imaging revealed residual thrombus within the bypass graft and I was asked to assist with intervention.  Please see Dr. Nicole Cella note for details of the diagnostic study.    Intervention: The diagnostic study revealed that there was residual thrombus within the bypass graft proximally as well as possibly an inflow stenosis.  The distal anastomosis was not evaluated well because of proximal disease.  Contrast injection is performed through a catheter distal to the anastomosis showed that the posterior tibial vessel remained patent.  For this reason we attempted for salvage of his bypass graft.  I upsized to a 7 French 45 cm Terumo sheath.  The patient was fully heparinized and ACT levels were checked maintaining a level greater than 230.  The penumbra CAT 6 device was used to perform mechanical thrombectomy of the bypass graft as well as the below-knee popliteal artery tibioperoneal trunk and posterior tibial  artery.  A significant amount of thrombus was evacuated.  On follow-up imaging the bypass graft was now patent.  There did appear to be outflow stenosis as well as inflow stenosis.  I selected a 3 x 220 [place] balloon and perform balloon angioplasty of the posterior tibial, tibioperoneal trunk, and femoral-popliteal bypass graft.  Subsequent imaging revealed patent inline flow however the tibial vessels because of their small size appeared to be in spasm and so 800 mcg of nitroglycerin was administered through a quick cross catheter located in the popliteal artery.  After this was done that did not appear to be any residual stenosis distally.  Attention was then turned towards the proximal anastomosis and a 5 x 40 [place] balloon was used to perform the proximal anastomosis.  Completion imaging revealed significant resolution of the bypass graft stenosis from greater than 60% to less than 20%.  I was satisfied with these results.  On the way out I reimage the right external iliac artery and since the patient said that he was not having significant right leg stenosis I elected not to treat this.  The groin was then closed with a minx device without complications.  Impression:  #1  Successful mechanical thrombectomy of an occluded left femoral-popliteal bypass graft using a penumbra CAT 6 device.  Subsequent balloon angioplasty of the posterior tibial, tibioperoneal trunk and distal anastomosis were performed using a 3 mm balloon.  The patient had a residual 60% proximal anastomotic stenosis that was treated with balloon angioplasty using a 5 mm balloon with residual stenosis less than 20%    V. Annamarie Major, M.D., [facility]  [facility] Office: [phone] Pager:  [phone]

## 2018-08-15 NOTE — Progress Notes (Signed)
Lab was unable to run heparin  And fibrinogen leves because blood sample was hemo lysed. Phlebotomy to stick patient.

## 2018-08-15 NOTE — Transfer of Care (Signed)
Immediate Anesthesia Transfer of Care Note  Patient: Willie Morales  Procedure(s) Performed: Left LOWER EXTREMITY ANGIOGRAM, Lysis Catheter Placement (Left Groin)  Patient Location: PACU  Anesthesia Type:MAC  Level of Consciousness: sedated and patient cooperative  Airway & Oxygen Therapy: Patient Spontanous Breathing  Post-op Assessment: Report given to RN and Post -op Vital signs reviewed and stable  Post vital signs: Reviewed and stable  Last Vitals:  Vitals Value Taken Time  BP 106/67 08/15/18 0039  Temp    Pulse 80 08/15/18 0039  Resp 18 08/15/18 0039  SpO2 98 % 08/15/18 0039  Vitals shown include unvalidated device data.  Last Pain:  Vitals:   08/14/18 1952  TempSrc: Oral  PainSc:          Complications: No apparent anesthesia complications

## 2018-08-15 NOTE — Op Note (Signed)
Patient name: Willie Morales MRN: [id number] DOB: 02/19/1961 Sex: male  08/15/2018 Pre-operative Diagnosis: Acute left lower extremity ischemia Post-operative diagnosis:  Same Surgeon:  Erlene Quan C. Donzetta Matters, MD Procedure Performed: 1.  Ultrasound-guided cannulation right common femoral artery 2.  Aortogram 3.  Left lower extremity angiogram 4.  Selection of left posterior tibial artery 5.  Placement of 50 cm treatment length UniFuse catheter into the left posterior tibial artery  Indications: 58 year old male with history of left lower extremity femoropopliteal bypass that was subsequently revised with vein patch angioplasty at the distal anastomosis.  There is also an existing Dacron patch angioplasty of the left common femoral and external iliac artery endarterectomy.  His bypass had occluded last December he underwent lytic therapy with subsequent stenting of his TP trunk and posterior tibial and peroneal arteries with coronary drug-eluting stents.  He presented with 3-day history of acute limb ischemia with suggestion that his bypass was occluded and likely the stents as well.  He was subsequently indicated for angiogram possible intervention possible lytic therapy.  Findings: Aorta is free of any flow-limiting stenosis.  There is actually a nearly occlusive right external iliac artery lesion.  The left external iliac artery stent is patent.  Profunda femoris is patent where previously there was a tight stenosis in the previous endarterectomy of the left external and common femoral arteries is also patent.  The bypass is occluded at its takeoff.  After traversing the bypass I was able to prove that the only runoff with his anterior tibial artery which was quite diminutive.  We were able to get into the posterior tibial artery stent improved patency of the posterior tibial artery distal to the previous stent.  A 50 cm treatment length UniFuse catheter was placed from the posterior tibial artery stent  back in 2 the proximal aspect of the PTFE bypass graft.  Patient will need lytic recheck with possible intervention.   Procedure:  The patient was identified in the holding area and taken to the operating room where is placed supine on the operative table and MAC anesthesia induced.  He was sterilely prepped draped in the bilateral groins in the usual fashion antibiotics were administered timeout called.  Ultrasound was used to identify the right common femoral artery which was noted to be diseased.  The area was anesthetized 1% lidocaine cannulated with micropuncture needle followed by wire and sheath.  We placed a Bentson wire followed by 5 [place] sheath.  Omni catheter placed to level 1 aortogram performed.  Across the bifurcation with Omni catheter and Bentson wire performed angled view of the groin.  I then injected contrast to attempt to visualize below-knee vessels but there was no visualization.  Patient was heparinized.  I then exchanged for a long 6 [place] sheath.  Glidewire advantage and quick cross catheter were used to traverse the bypass graft I performed angiography which demonstrated the only runoff to be the anterior tibial artery.  I then used an angled catheter to select the posterior tibial artery stent.  I then used the UniFuse catheter past the posterior tibial stent and performed angiography which demonstrated patency.  I placed the infusion wire.  Catheter extends up into the proximal aspect of the PTFE graft but not completely.  Sheath was sutured to the groin.  Patient will need lytic recheck later today.  He tolerated procedure without immediate complication.  EBL: 10cc    Doyal Saric C. Donzetta Matters, MD [facility] [place] Office: [phone] Pager:  [phone] ° ° ° °

## 2018-08-16 LAB — CBC
HCT: 36.7 % — ABNORMAL LOW (ref 39.0–52.0)
Hemoglobin: 12.1 g/dL — ABNORMAL LOW (ref 13.0–17.0)
MCH: 29.2 pg (ref 26.0–34.0)
MCHC: 33 g/dL (ref 30.0–36.0)
MCV: 88.4 fL (ref 80.0–100.0)
Platelets: 163 10*3/uL (ref 150–400)
RBC: 4.15 MIL/uL — ABNORMAL LOW (ref 4.22–5.81)
RDW: 15.4 % (ref 11.5–15.5)
WBC: 14.1 10*3/uL — ABNORMAL HIGH (ref 4.0–10.5)
nRBC: 0 % (ref 0.0–0.2)

## 2018-08-16 LAB — BASIC METABOLIC PANEL
Anion gap: 10 (ref 5–15)
BUN: 10 mg/dL (ref 6–20)
CO2: 20 mmol/L — ABNORMAL LOW (ref 22–32)
Calcium: 8.8 mg/dL — ABNORMAL LOW (ref 8.9–10.3)
Chloride: 106 mmol/L (ref 98–111)
Creatinine, Ser: 0.89 mg/dL (ref 0.61–1.24)
GFR calc Af Amer: >60 mL/min (ref >60)
GFR calc non Af Amer: >60 mL/min (ref >60)
Glucose, Bld: 152 mg/dL — ABNORMAL HIGH (ref 70–99)
Potassium: 4 mmol/L (ref 3.5–5.1)
Sodium: 136 mmol/L (ref 135–145)

## 2018-08-16 LAB — GLUCOSE, CAPILLARY: Glucose-Capillary: 189 mg/dL — ABNORMAL HIGH (ref 70–99)

## 2018-08-16 MED ORDER — ASPIRIN 81 MG PO TBEC: 81.0000 mg | DELAYED_RELEASE_TABLET | Freq: Every day | ORAL | Status: AC

## 2018-08-16 MED ORDER — APIXABAN 5 MG PO TABS
5.0000 mg | ORAL_TABLET | Freq: Two times a day (BID) | ORAL | Status: DC
  Administered 2018-08-16: 5 mg via ORAL
  Filled 2018-08-16: qty 1

## 2018-08-16 NOTE — Plan of Care (Signed)
  Problem: Education: Goal: Knowledge of General Education information will improve Description: Including pain rating scale, medication(s)/side effects and non-pharmacologic comfort measures Outcome: Completed/Met   Problem: Health Behavior/Discharge Planning: Goal: Ability to manage health-related needs will improve Outcome: Completed/Met   Problem: Clinical Measurements: Goal: Ability to maintain clinical measurements within normal limits will improve Outcome: Completed/Met   Problem: Clinical Measurements: Goal: Will remain free from infection Outcome: Completed/Met   Problem: Clinical Measurements: Goal: Diagnostic test results will improve Outcome: Completed/Met   Problem: Clinical Measurements: Goal: Respiratory complications will improve Outcome: Completed/Met   Problem: Clinical Measurements: Goal: Cardiovascular complication will be avoided Outcome: Completed/Met   Problem: Activity: Goal: Risk for activity intolerance will decrease Outcome: Completed/Met   Problem: Nutrition: Goal: Adequate nutrition will be maintained Outcome: Completed/Met   Problem: Coping: Goal: Level of anxiety will decrease Outcome: Completed/Met   Problem: Elimination: Goal: Will not experience complications related to bowel motility Outcome: Completed/Met Goal: Will not experience complications related to urinary retention Outcome: Completed/Met   Problem: Pain Managment: Goal: General experience of comfort will improve Outcome: Completed/Met   Problem: Safety: Goal: Ability to remain free from injury will improve Outcome: Completed/Met   Problem: Skin Integrity: Goal: Risk for impaired skin integrity will decrease Outcome: Completed/Met   Problem: Cardiovascular: Goal: Ability to achieve and maintain adequate cardiovascular perfusion will improve Outcome: Completed/Met Goal: Vascular access site(s) Level 0-1 will be maintained Outcome: Completed/Met   Problem:  Activity: Goal: Ability to return to baseline activity level will improve Outcome: Completed/Met   Problem: Education: Goal: Understanding of CV disease, CV risk reduction, and recovery process will improve Outcome: Completed/Met

## 2018-08-16 NOTE — Discharge Instructions (Addendum)
Information on my medicine - ELIQUIS (apixaban)  Why was Eliquis prescribed for you? Eliquis was prescribed for you to reduce the risk of a blood clot forming that can cause a stroke if you have a medical condition called atrial fibrillation (a type of irregular heartbeat).  What do You need to know about Eliquis ? Take your Eliquis TWICE DAILY - one tablet in the morning and one tablet in the evening with or without food. If you have difficulty swallowing the tablet whole please discuss with your pharmacist how to take the medication safely.  Take Eliquis exactly as prescribed by your doctor and DO NOT stop taking Eliquis without talking to the doctor who prescribed the medication.  Stopping may increase your risk of developing a stroke.  Refill your prescription before you run out.  After discharge, you should have regular check-up appointments with your healthcare provider that is prescribing your Eliquis.  In the future your dose may need to be changed if your kidney function or weight changes by a significant amount or as you get older.  What do you do if you miss a dose? If you miss a dose, take it as soon as you remember on the same day and resume taking twice daily.  Do not take more than one dose of ELIQUIS at the same time to make up a missed dose.  Important Safety Information A possible side effect of Eliquis is bleeding. You should call your healthcare provider right away if you experience any of the following: ? Bleeding from an injury or your nose that does not stop. ? Unusual colored urine (red or dark brown) or unusual colored stools (red or black). ? Unusual bruising for unknown reasons. ? A serious fall or if you hit your head (even if there is no bleeding).  Some medicines may interact with Eliquis and might increase your risk of bleeding or clotting while on Eliquis. To help avoid this, consult your healthcare provider or pharmacist prior to using any new  prescription or non-prescription medications, including herbals, vitamins, non-steroidal anti-inflammatory drugs (NSAIDs) and supplements.  This website has more information on Eliquis (apixaban): http://[email or web address].com/eliquis/home    [facility]  Discharge Instructions  Lower Extremity Angiogram; Angioplasty/Stenting  Please refer to the following instructions for your post-procedure care. Your surgeon or physician assistant will discuss any changes with you.  Activity  Avoid lifting more than 8 pounds (1 gallons of milk) for 72 hours (3 days) after your procedure. You may walk as much as you can tolerate. It's OK to drive after 72 hours.  Bathing/Showering  You may shower the day after your procedure. If you have a bandage, you may remove it at 24- 48 hours. Clean your incision site with mild soap and water. Pat the area dry with a clean towel.  Diet  Resume your pre-procedure diet. There are no special food restrictions following this procedure. All patients with peripheral vascular disease should follow a low fat/low cholesterol diet. In order to heal from your surgery, it is CRITICAL to get adequate nutrition. Your body requires vitamins, minerals, and protein. Vegetables are the best source of vitamins and minerals. Vegetables also provide the perfect balance of protein. Processed food has little nutritional value, so try to avoid this.  Medications  Resume taking all of your medications unless your doctor tells you not to. If your incision is causing pain, you may take over-the-counter pain relievers such as acetaminophen (Tylenol)  DO NOT RESTART  YOUR METFORMIN UNTIL 08/18/2018 DUE TO RECEIVING CONTRAST FOR YOUR PROCEDURE.   Follow Up  Follow up will be arranged at the time of your procedure. You may have an office visit scheduled or may be scheduled for surgery. Ask your surgeon if you have any questions.  Please call us immediately for  any of the following conditions: Severe or worsening pain your legs or feet at rest or with walking. Increased pain, redness, drainage at your groin puncture site. Fever of 101 degrees or higher. If you have any mild or slow bleeding from your puncture site: lie down, apply firm constant pressure over the area with a piece of gauze or a clean wash cloth for 30 minutes- no peeking!, call 911 right away if you are still bleeding after 30 minutes, or if the bleeding is heavy and unmanageable.  Reduce your risk factors of vascular disease:   Stop smoking. If you would like help call [facility] at 1-800-QUIT-NOW (256-133-76811-(571)643-5569) or [place] at [phone].  Manage your cholesterol  Maintain a desired weight  Control your diabetes  Keep your blood pressure down   If you have any questions, please call the office at [phone]

## 2018-08-16 NOTE — Progress Notes (Signed)
Patient was specifically told not to start his metformin until the morning of  Monday 6/29 because he received contrast dye in the hospital. Risks of taken metformin in the context of dye were explained to patients.Marland Kitchen He endorsed understanding

## 2018-08-16 NOTE — Discharge Summary (Signed)
Discharge Summary    Willie Morales 11-14-1960 58 y.o. male  [id number]  Admission Date: 08/14/2018  Discharge Date: 08/16/2018  Physician: Thomes Lolling*  Admission Diagnosis: Arterial embolism and thrombosis of lower extremity ([place]) [I74.3] Peripheral vascular disease of extremity ([place]) [I73.9] PAD (peripheral artery disease) ([place]) [I73.9]   HPI:   This is a 58 y.o. male with history of left lower extremity femoropopliteal bypass that was subsequently revised with vein patch angioplasty at the distal anastomosis.  There is also an existing Dacron patch angioplasty of the left common femoral and external iliac artery endarterectomy.  His bypass had occluded last December he underwent lytic therapy with subsequent stenting of his TP trunk and posterior tibial and peroneal arteries with coronary drug-eluting stents.  He presented with 3-day history of acute limb ischemia with suggestion that his bypass was occluded and likely the stents as well.  He was subsequently indicated for angiogram possible intervention possible lytic therapy.  Hospital Course:  The patient was admitted to the hospital and taken to the operating room on 08/15/2018 and underwent: 1.  Ultrasound-guided cannulation right common femoral artery 2.  Aortogram 3.  Left lower extremity angiogram 4.  Selection of left posterior tibial artery 5.  Placement of 50 cm treatment length UniFuse catheter into the left posterior tibial artery    Findings: Aorta is free of any flow-limiting stenosis.  There is actually a nearly occlusive right external iliac artery lesion.  The left external iliac artery stent is patent.  Profunda femoris is patent where previously there was a tight stenosis in the previous endarterectomy of the left external and common femoral arteries is also patent.  The bypass is occluded at its takeoff.  After traversing the bypass I was able to prove that the only runoff with his anterior tibial  artery which was quite diminutive.  We were able to get into the posterior tibial artery stent improved patency of the posterior tibial artery distal to the previous stent.  A 50 cm treatment length UniFuse catheter was placed from the posterior tibial artery stent back in 2 the proximal aspect of the PTFE bypass graft.  Patient will need lytic recheck with possible intervention.  The pt tolerated the procedure well and was transported to the PACU in good condition.   He was taken back to the [facility] lab and underwent f/u LLE arteriogram.   He underwent:   1.  Mechanical thrombectomy using the penumbra CAT 6 device of the left femoral-popliteal bypass graft, tibioperoneal trunk, and posterior tibial artery 2.  Balloon angioplasty of the right posterior tibial, tibioperoneal trunk, popliteal, and femoral-popliteal bypass graft 3.  Intra-arterial administration of nitroglycerin 4.  Conscious sedation, greater than 60 minutes 5.  Mynx closure device  Impression:             #1  Successful mechanical thrombectomy of an occluded left femoral-popliteal bypass graft using a penumbra CAT 6 device.  Subsequent balloon angioplasty of the posterior tibial, tibioperoneal trunk and distal anastomosis were performed using a 3 mm balloon.  The patient had a residual 60% proximal anastomotic stenosis that was treated with balloon angioplasty using a 5 mm balloon with residual stenosis less than 20%  By POD 1, he was doing well with a biphasic PT and monophasic DP signal in the left foot.  Right groin without hematoma.  He is discharged home with instructions to f/u in 1-2 weeks with ABI's.    The remainder of the hospital course consisted  of increasing mobilization and increasing intake of solids without difficulty.  CBC    Component Value Date/Time   WBC 14.1 (H) 08/16/2018 0226   RBC 4.15 (L) 08/16/2018 0226   HGB 12.1 (L) 08/16/2018 0226   HCT 36.7 (L) 08/16/2018 0226   PLT 163 08/16/2018 0226   MCV  88.4 08/16/2018 0226   MCH 29.2 08/16/2018 0226   MCHC 33.0 08/16/2018 0226   RDW 15.4 08/16/2018 0226   LYMPHSABS 2.8 08/14/2018 1954   MONOABS 1.6 (H) 08/14/2018 1954   EOSABS 0.2 08/14/2018 1954   BASOSABS 0.1 08/14/2018 1954    BMET    Component Value Date/Time   NA 136 08/16/2018 0226   K 4.0 08/16/2018 0226   CL 106 08/16/2018 0226   CO2 20 (L) 08/16/2018 0226   GLUCOSE 152 (H) 08/16/2018 0226   BUN 10 08/16/2018 0226   CREATININE 0.89 08/16/2018 0226   CALCIUM 8.8 (L) 08/16/2018 0226   GFRNONAA >60 08/16/2018 0226   GFRAA >60 08/16/2018 0226      Discharge Instructions    Discharge patient   Complete by: As directed    Discharge disposition: 01-Home or Self Care   Discharge patient date: 08/16/2018      Discharge Diagnosis:  Arterial embolism and thrombosis of lower extremity ([facility]) [I74.3] Peripheral vascular disease of extremity ([facility]) [I73.9] PAD (peripheral artery disease) ([facility]) [I73.9]  Secondary Diagnosis: Patient Active Problem List   Diagnosis Date Noted  . Acute deep vein thrombosis of lower leg ([facility]) 08/15/2018  . Encounter for screening colonoscopy 08/07/2018  . Carotid arterial disease ([facility]) 07/22/2018  . Hypokalemia   . Hypervolemia   . Chronic atrial fibrillation 01/24/2018  . Essential hypertension 01/24/2018  . Hyperlipidemia 01/24/2018  . Uncontrolled diabetes mellitus ([facility]CC) 01/24/2018  . Tobacco abuse 01/24/2018  . Acute hypoxemic respiratory failure (HCC)   . Acute pulmonary edema ([facility])   . Atrial fibrillation with rapid ventricular response (H[facility])   . Limb ischemia 01/23/2018  . Pressure injury of skin 09/18/2017  . Ischemic leg 09/17/2017  . PAD (peripheral artery disease) (H[facility]) 05/09/2017  . Atherosclerosis of artery of extremity with rest pain ([facility])   . Preoperative cardiovascular examination   . Leukocytosis 12/10/2016  . Coronary artery disease    Past Medical History:  Diagnosis Date  . Carotid artery disease (HCC)    a.  60-70% RICA, totally occluded LICA, > 50% stenosed bilat ECA 04/2017.  Marland Kitchen. Chronic atrial fibrillation   . Coronary artery disease    a. remote cath in 2009 showing 30% mRCA, 30% PDA, 45% LAD, 30% diag.  . Diabetes mellitus   . Dyslipidemia   . History of kidney stones   . Hypertension   . PAD (peripheral artery disease) (H[facility])   . Tobacco abuse      Allergies as of 08/16/2018      Reactions   Ciprofloxacin Rash      Medication List    TAKE these medications   apixaban 5 MG Tabs tablet Commonly known as: Eliquis Take 1 tablet (5 mg total) by mouth 2 (two) times daily.   aspirin 81 MG EC tablet Take 1 tablet (81 mg total) by mouth daily.   atorvastatin 40 MG tablet Commonly known as: LIPITOR Take 1 tablet (40 mg total) by mouth daily at 6 PM.   benazepril 10 MG tablet Commonly known as: LOTENSIN Take 1 tablet (10 mg total) by mouth daily.   buPROPion 200 MG 12 hr tablet Commonly known  as: WELLBUTRIN SR Take 200 mg by mouth every morning.   clopidogrel 75 MG tablet Commonly known as: Plavix Take 1 tablet (75 mg total) by mouth daily.   gabapentin 300 MG capsule Commonly known as: Neurontin Take 1 capsule (300 mg total) by mouth 3 (three) times daily. What changed: when to take this   glimepiride 4 MG tablet Commonly known as: Amaryl Take 1 tablet (4 mg total) by mouth daily with breakfast.   HYDROcodone-acetaminophen 5-325 MG tablet Commonly known as: NORCO/VICODIN Take 1 tablet by mouth every 6 (six) hours as needed for moderate pain.   levothyroxine 50 MCG tablet Commonly known as: SYNTHROID Take 50 mcg by mouth daily before breakfast.   metoprolol tartrate 50 MG tablet Commonly known as: LOPRESSOR Take 1 tablet (50 mg total) by mouth 2 (two) times daily.   sertraline 50 MG tablet Commonly known as: ZOLOFT Take 50 mg by mouth at bedtime.   sitaGLIPtin-metformin 50-1000 MG tablet Commonly known as: JANUMET Take 1 tablet by mouth 2 (two) times daily.          Instructions:  [facility]  Discharge Instructions  Lower Extremity Angiogram; Angioplasty/Stenting  Please refer to the following instructions for your post-procedure care. Your surgeon or physician assistant will discuss any changes with you.  Activity  Avoid lifting more than 8 pounds (1 gallons of milk) for 72 hours (3 days) after your procedure. You may walk as much as you can tolerate. It's OK to drive after 72 hours.  Bathing/Showering  You may shower the day after your procedure. If you have a bandage, you may remove it at 24- 48 hours. Clean your incision site with mild soap and water. Pat the area dry with a clean towel.  Diet  Resume your pre-procedure diet. There are no special food restrictions following this procedure. All patients with peripheral vascular disease should follow a low fat/low cholesterol diet. In order to heal from your surgery, it is CRITICAL to get adequate nutrition. Your body requires vitamins, minerals, and protein. Vegetables are the best source of vitamins and minerals. Vegetables also provide the perfect balance of protein. Processed food has little nutritional value, so try to avoid this.  Medications  Resume taking all of your medications unless your doctor tells you not to. If your incision is causing pain, you may take over-the-counter pain relievers such as acetaminophen (Tylenol)  DO NOT RESTART YOUR METFORMIN UNTIL 08/18/2018 DUE TO RECEIVING CONTRAST FOR YOUR PROCEDURE.  Follow Up  Follow up will be arranged at the time of your procedure. You may have an office visit scheduled or may be scheduled for surgery. Ask your surgeon if you have any questions.  Please call us immediately for any of the following conditions: .Severe or worsening pain your legs or feet at rest or with walking. .Increased pain, redness, drainage at your groin puncture site. .Fever of 101 degrees or higher. .If you have  any mild or slow bleeding from your puncture site: lie down, apply firm constant pressure over the area with a piece of gauze or a clean wash cloth for 30 minutes- no peeking!, call 911 right away if you are still bleeding after 30 minutes, or if the bleeding is heavy and unmanageable.  Reduce your risk factors of vascular disease:  . Stop smoking. If you would like help call [facility] at 1-800-QUIT-NOW (602-552-92261-601-008-1762) or [place] at 979-702-1925518 [phone]. . Manage your cholesterol . Maintain a desired weight . Control your  diabetes . Keep your blood pressure down .  If you have any questions, please call the office at [phone]  Prescriptions given: 1.  aspirin 81mg  OTC  Disposition: home  Patient's condition: is Good  Follow up: 1. Dr. Myra GianottiBrabham in 2 weeks with ABI's   Doreatha MassedSamantha Rhyne, PA-C [facility] [phone] 08/16/2018  8:54 AM

## 2018-08-16 NOTE — Progress Notes (Signed)
  Progress Note    08/16/2018 8:31 AM 1 Day Post-Op  Subjective: Comfortable.  Reports complete resolution of rest pain in his left foot   Vitals:   08/16/18 0700 08/16/18 0756  BP: (!) 141/78   Pulse: 78 (!) 44  Resp: 20 (!) 22  Temp:  (P) 98.1 F (36.7 C)  SpO2: 96% 99%   Physical Exam: Left foot warm with no motor or sensory loss.  Biphasic posterior tibial and monophasic dorsalis pedis signal.  Right groin puncture without hematoma  CBC    Component Value Date/Time   WBC 14.1 (H) 08/16/2018 0226   RBC 4.15 (L) 08/16/2018 0226   HGB 12.1 (L) 08/16/2018 0226   HCT 36.7 (L) 08/16/2018 0226   PLT 163 08/16/2018 0226   MCV 88.4 08/16/2018 0226   MCH 29.2 08/16/2018 0226   MCHC 33.0 08/16/2018 0226   RDW 15.4 08/16/2018 0226   LYMPHSABS 2.8 08/14/2018 1954   MONOABS 1.6 (H) 08/14/2018 1954   EOSABS 0.2 08/14/2018 1954   BASOSABS 0.1 08/14/2018 1954    BMET    Component Value Date/Time   NA 136 08/16/2018 0226   K 4.0 08/16/2018 0226   CL 106 08/16/2018 0226   CO2 20 (L) 08/16/2018 0226   GLUCOSE 152 (H) 08/16/2018 0226   BUN 10 08/16/2018 0226   CREATININE 0.89 08/16/2018 0226   CALCIUM 8.8 (L) 08/16/2018 0226   GFRNONAA >60 08/16/2018 0226   GFRAA >60 08/16/2018 0226    INR    Component Value Date/Time   INR 1.3 (H) 08/14/2018 1954     Intake/Output Summary (Last 24 hours) at 08/16/2018 0831 Last data filed at 08/16/2018 0700 Gross per 24 hour  Intake 1708.5 ml  Output 1650 ml  Net 58.5 ml     Assessment/Plan:  58 y.o. male patient asking for discharge to home.  I feel that this is appropriate.  Will begin his Eliquis prior to discharge.  I did explain to the patient with his recurrent occlusions that he is certainly at high risk for reocclusion and eventual amputation.  He reports that he would be unable to function with amputation and I explained that this certainly would be avoided if all possible.  We will follow-up in our office     Rosetta Posner, MD [facility] [phone] 08/16/2018 8:31 AM

## 2018-08-16 NOTE — Progress Notes (Signed)
Patient was discharged at 12 noon after discharge instruction were given to patient and patient endorsed understanding. Patients vitals were stable. Patient's belongings including clothes, phone, and personal walker were handed off to patient. Patient was taken outside in a wheelchair where  His pick up ride was waiting.

## 2018-08-17 LAB — CBC
HCT: 38.8 % — ABNORMAL LOW (ref 39.0–52.0)
Hemoglobin: 12.7 g/dL — ABNORMAL LOW (ref 13.0–17.0)
MCH: 29.2 pg (ref 26.0–34.0)
MCHC: 32.7 g/dL (ref 30.0–36.0)
MCV: 89.2 fL (ref 80.0–100.0)
Platelets: 182 10*3/uL (ref 150–400)
RBC: 4.35 MIL/uL (ref 4.22–5.81)
RDW: 15.4 % (ref 11.5–15.5)
WBC: 17.7 10*3/uL — ABNORMAL HIGH (ref 4.0–10.5)
nRBC: 0 % (ref 0.0–0.2)

## 2018-08-18 ENCOUNTER — Encounter (HOSPITAL_COMMUNITY): Payer: Self-pay | Admitting: Surgery

## 2018-08-18 MED FILL — Lidocaine HCl Local Preservative Free (PF) Inj 1%: INTRAMUSCULAR | Qty: 30 | Status: AC

## 2018-08-21 NOTE — Telephone Encounter (Signed)
Open n error °

## 2018-08-28 ENCOUNTER — Telehealth (HOSPITAL_COMMUNITY): Payer: Self-pay

## 2018-08-28 ENCOUNTER — Other Ambulatory Visit: Payer: Self-pay

## 2018-08-28 DIAGNOSIS — I739 Peripheral vascular disease, unspecified: Secondary | ICD-10-CM

## 2018-08-28 NOTE — Telephone Encounter (Signed)
The above patient or their representative was contacted and gave the following answers to these questions:         Do you have any of the following symptoms? No  Fever                    Cough                   Shortness of breath  Do  you have any of the following other symptoms? No   muscle pain         vomiting,        diarrhea        rash         weakness        red eye        abdominal pain         bruising          bruising or bleeding              joint pain           severe headache    Have you been in contact with someone who was or has been sick in the past 2 weeks? No  Yes                 Unsure                         Unable to assess   Does the person that you were in contact with have any of the following symptoms?   Cough         shortness of breath           muscle pain         vomiting,            diarrhea            rash            weakness           fever            red eye           abdominal pain           bruising  or  bleeding                joint pain                severe headache               Have you  or someone you have been in contact with traveled internationally in th last month? No        If yes, which countries?   Have you  or someone you have been in contact with traveled outside [place] in th last month? No         If yes, which state and city?   COMMENTS OR ACTION PLAN FOR THIS PATIENT:          

## 2018-08-29 ENCOUNTER — Ambulatory Visit (HOSPITAL_COMMUNITY)
Admission: RE | Admit: 2018-08-29 | Discharge: 2018-08-29 | Disposition: A | Discharge status: Home / Self Care | Payer: BC Managed Care – PPO | Source: Ambulatory Visit | Attending: Family | Admitting: Family

## 2018-08-29 ENCOUNTER — Ambulatory Visit (INDEPENDENT_AMBULATORY_CARE_PROVIDER_SITE_OTHER): Payer: Self-pay | Admitting: Family

## 2018-08-29 ENCOUNTER — Encounter: Payer: Self-pay | Admitting: *Deleted

## 2018-08-29 ENCOUNTER — Other Ambulatory Visit: Payer: Self-pay

## 2018-08-29 ENCOUNTER — Other Ambulatory Visit: Payer: Self-pay | Admitting: *Deleted

## 2018-08-29 ENCOUNTER — Encounter: Payer: Self-pay | Admitting: Family

## 2018-08-29 ENCOUNTER — Other Ambulatory Visit (HOSPITAL_COMMUNITY)
Admission: RE | Admit: 2018-08-29 | Discharge: 2018-08-29 | Disposition: A | Discharge status: Home / Self Care | Payer: BC Managed Care – PPO | Source: Ambulatory Visit | Attending: Family | Admitting: Family

## 2018-08-29 VITALS — BP 103/62 | HR 83 | Temp 97.6°F | Resp 16 | Ht 73.0 in | Wt 174.0 lb

## 2018-08-29 DIAGNOSIS — I739 Peripheral vascular disease, unspecified: Secondary | ICD-10-CM | POA: Diagnosis not present

## 2018-08-29 DIAGNOSIS — E1152 Type 2 diabetes mellitus with diabetic peripheral angiopathy with gangrene: Secondary | ICD-10-CM

## 2018-08-29 DIAGNOSIS — I779 Disorder of arteries and arterioles, unspecified: Secondary | ICD-10-CM

## 2018-08-29 DIAGNOSIS — Z87891 Personal history of nicotine dependence: Secondary | ICD-10-CM

## 2018-08-29 DIAGNOSIS — Z8673 Personal history of transient ischemic attack (TIA), and cerebral infarction without residual deficits: Secondary | ICD-10-CM

## 2018-08-29 DIAGNOSIS — I6521 Occlusion and stenosis of right carotid artery: Secondary | ICD-10-CM

## 2018-08-29 DIAGNOSIS — E1151 Type 2 diabetes mellitus with diabetic peripheral angiopathy without gangrene: Secondary | ICD-10-CM

## 2018-08-29 DIAGNOSIS — I6522 Occlusion and stenosis of left carotid artery: Secondary | ICD-10-CM

## 2018-08-29 DIAGNOSIS — Z95828 Presence of other vascular implants and grafts: Secondary | ICD-10-CM

## 2018-08-29 DIAGNOSIS — Z1159 Encounter for screening for other viral diseases: Secondary | ICD-10-CM | POA: Insufficient documentation

## 2018-08-29 MED ORDER — CEPHALEXIN 500 MG PO CAPS: 500.0000 mg | ORAL_CAPSULE | Freq: Four times a day (QID) | ORAL | 0 refills | Status: DC

## 2018-08-29 NOTE — Patient Instructions (Addendum)
Peripheral Vascular Disease  Peripheral vascular disease (PVD) is a disease of the blood vessels that are not part of your heart and brain. A simple term for PVD is poor circulation. In most cases, PVD narrows the blood vessels that carry blood from your heart to the rest of your body. This can reduce the supply of blood to your arms, legs, and internal organs, like your stomach or kidneys. However, PVD most often affects a person's lower legs and feet. Without treatment, PVD tends to get worse. PVD can also lead to acute ischemic limb. This is when an arm or leg suddenly cannot get enough blood. This is a medical emergency. Follow these instructions at home: Lifestyle  Do not use any products that contain nicotine or tobacco, such as cigarettes and e-cigarettes. If you need help quitting, ask your doctor.  Lose weight if you are overweight. Or, stay at a healthy weight as told by your doctor.  Eat a diet that is low in fat and cholesterol. If you need help, ask your doctor.  Exercise regularly. Ask your doctor for activities that are right for you. General instructions  Take over-the-counter and prescription medicines only as told by your doctor.  Take good care of your feet: ? Wear comfortable shoes that fit well. ? Check your feet often for any cuts or sores.  Keep all follow-up visits as told by your doctor This is important. Contact a doctor if:  You have cramps in your legs when you walk.  You have leg pain when you are at rest.  You have coldness in a leg or foot.  Your skin changes.  You are unable to get or have an erection (erectile dysfunction).  You have cuts or sores on your feet that do not heal. Get help right away if:  Your arm or leg turns cold, numb, and blue.  Your arms or legs become red, warm, swollen, painful, or numb.  You have chest pain.  You have trouble breathing.  You suddenly have weakness in your face, arm, or leg.  You become very  confused or you cannot speak.  You suddenly have a very bad headache.  You suddenly cannot see. Summary  Peripheral vascular disease (PVD) is a disease of the blood vessels.  A simple term for PVD is poor circulation. Without treatment, PVD tends to get worse.  Treatment may include exercise, low fat and low cholesterol diet, and quitting smoking. This information is not intended to replace advice given to you by your health care provider. Make sure you discuss any questions you have with your health care provider. Document Released: 05/02/2009 Document Revised: 01/18/2017 Document Reviewed: 03/15/2016 Elsevier Patient Education  2020 [employer]     Stroke Prevention Some medical conditions and lifestyle choices can lead to a higher risk for a stroke. You can help to prevent a stroke by making nutrition, lifestyle, and other changes. What nutrition changes can be made?   Eat healthy foods. ? Choose foods that are high in fiber. These include:  Fresh fruits.  Fresh vegetables.  Whole grains. ? Eat at least 5 or more servings of fruits and vegetables each day. Try to fill half of your plate at each meal with fruits and vegetables. ? Choose lean protein foods. These include:  Lowfat (lean) cuts of meat.  Chicken without skin.  Fish.  Tofu.  Beans.  Nuts. ? Eat low-fat dairy products. ? Avoid foods that:  Are high in salt (sodium).  Have   saturated fat.  Have trans fat.  Have cholesterol.  Are processed.  Are premade.  Follow eating guidelines as told by your doctor. These may include: ? Reducing how many calories you eat and drink each day. ? Limiting how much salt you eat or drink each day to 1,500 milligrams (mg). ? Using only healthy fats for cooking. These include:  Olive oil.  Canola oil.  Sunflower oil. ? Counting how many carbohydrates you eat and drink each day. What lifestyle changes can be made?  Try to stay at a healthy weight. Talk  to your doctor about what a good weight is for you.  Get at least 30 minutes of moderate physical activity at least 5 days a week. This can include: ? Fast walking. ? Biking. ? Swimming.  Do not use any products that have nicotine or tobacco. This includes cigarettes and e-cigarettes. If you need help quitting, ask your doctor. Avoid being around tobacco smoke in general.  Limit how much alcohol you drink to no more than 1 drink a day for nonpregnant women and 2 drinks a day for men. One drink equals 12 oz of beer, 5 oz of wine, or 1 oz of hard liquor.  Do not use drugs.  Avoid taking birth control pills. Talk to your doctor about the risks of taking birth control pills if: ? You are over 35 years old. ? You smoke. ? You get migraines. ? You have had a blood clot. What other changes can be made?  Manage your cholesterol. ? It is important to eat a healthy diet. ? If your cholesterol cannot be managed through your diet, you may also need to take medicines. Take medicines as told by your doctor.  Manage your diabetes. ? It is important to eat a healthy diet and to exercise regularly. ? If your blood sugar cannot be managed through diet and exercise, you may need to take medicines. Take medicines as told by your doctor.  Control your high blood pressure (hypertension). ? Try to keep your blood pressure below 130/80. This can help lower your risk of stroke. ? It is important to eat a healthy diet and to exercise regularly. ? If your blood pressure cannot be managed through diet and exercise, you may need to take medicines. Take medicines as told by your doctor. ? Ask your doctor if you should check your blood pressure at home. ? Have your blood pressure checked every year. Do this even if your blood pressure is normal.  Talk to your doctor about getting checked for a sleep disorder. Signs of this can include: ? Snoring a lot. ? Feeling very tired.  Take over-the-counter and  prescription medicines only as told by your doctor. These may include aspirin or blood thinners (antiplatelets or anticoagulants).  Make sure that any other medical conditions you have are managed. Where to find more information  American Stroke Association: www.strokeassociation.org  National Stroke Association: www.stroke.org Get help right away if:  You have any symptoms of stroke. "BE FAST" is an easy way to remember the main warning signs: ? B - Balance. Signs are dizziness, sudden trouble walking, or loss of balance. ? E - Eyes. Signs are trouble seeing or a sudden change in how you see. ? F - Face. Signs are sudden weakness or loss of feeling of the face, or the face or eyelid drooping on one side. ? A - Arms. Signs are weakness or loss of feeling in an arm. This happens suddenly   and usually on one side of the body. ? S - Speech. Signs are sudden trouble speaking, slurred speech, or trouble understanding what people say. ? T - Time. Time to call emergency services. Write down what time symptoms started.  You have other signs of stroke, such as: ? A sudden, very bad headache with no known cause. ? Feeling sick to your stomach (nausea). ? Throwing up (vomiting). ? Jerky movements you cannot control (seizure). These symptoms may represent a serious problem that is an emergency. Do not wait to see if the symptoms will go away. Get medical help right away. Call your local emergency services (911 in the [place] Do not drive yourself to the hospital. Summary  You can prevent a stroke by eating healthy, exercising, not smoking, drinking less alcohol, and treating other health problems, such as diabetes, high blood pressure, or high cholesterol.  Do not use any products that contain nicotine or tobacco, such as cigarettes and e-cigarettes.  Get help right away if you have any signs or symptoms of a stroke. This information is not intended to replace advice given to you by your health care  provider. Make sure you discuss any questions you have with your health care provider. Document Released: 08/07/2011 Document Revised: 04/03/2018 Document Reviewed: 05/09/2016 Elsevier Patient Education  2020 Elsevier Inc.  

## 2018-08-29 NOTE — Progress Notes (Addendum)
[facility] [place]   CC: Follow up peripheral artery occlusive disease  History of Present Illness Willie Morales is a 58 y.o. male who is s/p mechanical thrombectomy of the left femoral-popliteal bypass graft, tibioperoneal trunk, and posterior tibial artery, balloon angioplasty of the right posterior tibial, tibioperoneal trunk, popliteal, and femoral-popliteal bypass graft, and intra-arterial administration of nitroglycerin on 08-15-18 by Dr. Trula Slade. The patient has previously undergone a femoral-popliteal bypass graft with Gore-Tex.  This occluded about 6 months prior.  Blood flow was reestablished and he had stenting of his posterior tibial and peroneal arteries.  He came the night prior with an ischemic leg and a lytics catheter was placed.  He received TPA overnight.  Dr. Scot Dock brought him back that day for a follow-up study.  Imaging revealed residual thrombus within the bypass graft and Dr. Trula Slade was asked to assist with intervention.  Pt returns today for follow up.   He states the sore on his left foot and the swelling in his left foot started shortly after the last above intervention. He states moderate pain only in his left 5th toe. He denies fever or chills.   Diabetic: Yes, most recent A1C result on file was 7.6 on 01-24-18 Tobacco use: former smoker  (1/2 ppd x 35 yrs), quit about 08-20-18.   Pt meds include: Statin :Yes Betablocker: Yes ASA: Yes Other anticoagulants/antiplatelets: Eliquis, Plavix  Past Medical History:  Diagnosis Date  . Carotid artery disease (HCC)    a. 60-70% RICA, totally occluded LICA, > 35% stenosed bilat ECA 04/2017.  Marland Kitchen Chronic atrial fibrillation   . Coronary artery disease    a. remote cath in 2009 showing 30% mRCA, 30% PDA, 45% LAD, 30% diag.  . Diabetes mellitus   . Dyslipidemia   . History of kidney stones   . Hypertension   . PAD (peripheral artery disease) ([place])   . Tobacco abuse     Social History Social  History   Tobacco Use  . Smoking status: Current Every Day Smoker    Packs/day: 0.50    Years: 35.00    Pack years: 17.50    Types: Cigarettes  . Smokeless tobacco: Never Used  . Tobacco comment: 2-3 cigarettes per day  Substance Use Topics  . Alcohol use: No  . Drug use: No    Family History Family History  Problem Relation Age of Onset  . Coronary artery disease Father        CABG in early 64's  . Heart attack Father   . Heart disease Father   . Cancer Mother   . Heart disease Mother   . Heart disease Brother   . Diabetes Maternal Grandmother   . SIDS Sister   . Colon cancer Neg Hx     Past Surgical History:  Procedure Laterality Date  . ABDOMINAL AORTOGRAM W/LOWER EXTREMITY N/A 07/05/2016   Procedure: Abdominal Aortogram w/Lower Extremity;  Surgeon: Conrad [place], MD;  Location: [facility];  Service: Cardiovascular;  Laterality: N/A;  . ABDOMINAL AORTOGRAM W/LOWER EXTREMITY N/A 05/06/2017   Procedure: ABDOMINAL AORTOGRAM W/LOWER EXTREMITY;  Surgeon: Waynetta Sandy, MD;  Location: [place] [facility];  Service: Cardiovascular;  Laterality: N/A;  . ABDOMINAL AORTOGRAM W/LOWER EXTREMITY N/A 08/15/2018   Procedure: ABDOMINAL AORTOGRAM W/LOWER EXTREMITY;  Surgeon: Serafina Mitchell, MD;  Location: [place] [facility];  Service: Cardiovascular;  Laterality: N/A;  . APPLICATION OF WOUND VAC Left 01/29/2018   Procedure: APPLICATION OF WOUND  VAC LEFT LOWER LEG MEDIAL AND LATERAL SIDES;  Surgeon: Nada LibmanBrabham, Vance W, MD;  [facility];  Service: Vascular;  Laterality: Left;  . BACK SURGERY  1991  . CARDIAC CATHETERIZATION  07/31/2007   This showed nonobstructive coronary artery disease   He had 30% lesion in the mid RCA, 30% lesion in the PDA, 45% lesion in the LAD and mid  LAD, and 30% lesion in the diagonal.  The left circumflex was normal.  The LV function was 60%.     Marland Kitchen. ENDARTERECTOMY FEMORAL Left 05/09/2017   Procedure: ENDARTERECTOMY FEMORAL WITH DACRON PATCH  ANGIOPLASTY;  Surgeon: Fransisco Hertzhen, Brian L, MD;  [facility];  Service: Vascular;  Laterality: Left;  . ENDARTERECTOMY POPLITEAL  09/17/2017   Procedure: ENDARTERECTOMY POPLITEAL ARTERY AND TIBIAL FIBULAR TRUNK;  Surgeon: Chuck Hintickson, Christopher S, MD;  [facility];  Service: Vascular;;  . FASCIOTOMY CLOSURE Left 01/24/2018   Procedure: FASCIOTOMY OF LOWER LEFT LEG;  Surgeon: Nada LibmanBrabham, Vance W, MD;  [facility];  Service: Vascular;  Laterality: Left;  . FASCIOTOMY CLOSURE Left 01/29/2018   Procedure: FASCIOTOMY CLOSURE LEFT LOWER EXTREMITY;  Surgeon: Nada LibmanBrabham, Vance W, MD;  [facility];  Service: Vascular;  Laterality: Left;  . FEMORAL-POPLITEAL BYPASS GRAFT Left 05/09/2017   Procedure: BYPASS GRAFT FEMORAL-POPLITEAL ARTERY LEFT;  Surgeon: Fransisco Hertzhen, Brian L, MD;  [facility];  Service: Vascular;  Laterality: Left;  . FEMORAL-POPLITEAL BYPASS GRAFT Left 09/17/2017   Procedure: THROMBECTOMY LEFT FEMORAL-POPLITEAL BYPASS GRAFT;  Surgeon: Chuck Hintickson, Christopher S, MD;  [facility];  Service: Vascular;  Laterality: Left;  . KIDNEY STONE SURGERY  2011  . LOWER EXTREMITY ANGIOGRAM Left 01/23/2018   Procedure: LEFT LOWER EXTREMITY ANGIOGRAM, THROMBOLYSIS Catheter Placement;  Surgeon: Cephus Shellinglark, Christopher J, MD;  [facility];  Service: Vascular;  Laterality: Left;  . LOWER EXTREMITY ANGIOGRAM Left 08/14/2018   Procedure: Left LOWER EXTREMITY ANGIOGRAM, Lysis Catheter Placement;  Surgeon: Maeola Harmanain, Brandon Christopher, MD;  [facility];  Service: Vascular;  Laterality: Left;  . LOWER EXTREMITY ANGIOGRAPHY N/A 12/06/2016   Procedure: Lower Extremity Angiography;  Surgeon: Fransisco Hertzhen, Brian L, MD;  [facility];  Service: Cardiovascular;  Laterality: N/A;  . LOWER EXTREMITY ANGIOGRAPHY N/A 09/05/2017   Procedure: LOWER EXTREMITY ANGIOGRAPHY;  Surgeon: Fransisco Hertzhen, Brian L, MD;  [facility];  Service: Cardiovascular;  Laterality: N/A;  . PATCH ANGIOPLASTY Left 05/09/2017   Procedure:  PATCH ANGIOPLASTY WITH PROPATEN GRAFT  OF BELOW KNEE POPLITEAL ARTERY;  Surgeon: Fransisco Hertzhen, Brian L, MD;  [facility];  Service: Vascular;  Laterality: Left;  . PATCH ANGIOPLASTY Left 09/17/2017   Procedure: PATCH ANGIOPLASTY WITH PERICARDIAL BOVINE PATCH OF DISTAL ANASTOMOSIS;  Surgeon: Chuck Hintickson, Christopher S, MD;  [facility];  Service: Vascular;  Laterality: Left;  . PERCUTANEOUS VENOUS THROMBECTOMY,LYSIS WITH INTRAVASCULAR ULTRASOUND (IVUS) Left 01/24/2018   Procedure: Lysis Re-check;  Surgeon: Nada LibmanBrabham, Vance W, MD;  [facility];  Service: Vascular;  Laterality: Left;  . PERIPHERAL VASCULAR ATHERECTOMY Right 07/19/2016   Procedure: Peripheral Vascular Atherectomy;  Surgeon: Fransisco Hertzhen, Brian L, MD;  [facility];  Service: Cardiovascular;  Laterality: Right;  . PERIPHERAL VASCULAR BALLOON ANGIOPLASTY Left 07/05/2016   Procedure: Peripheral Vascular Balloon Angioplasty;  Surgeon: Fransisco Hertzhen, Brian L, MD;  [facility];  Service: Cardiovascular;  Laterality: Left;  SFA  . PERIPHERAL VASCULAR BALLOON ANGIOPLASTY Left 09/05/2017   Procedure: PERIPHERAL VASCULAR BALLOON ANGIOPLASTY;  Surgeon: Fransisco Hertzhen, Brian L, MD;  [facility];  Service: Cardiovascular;  Laterality: Left;  Popliteal, profunda femoral, external iliac  . PERIPHERAL VASCULAR BALLOON ANGIOPLASTY Left 08/15/2018   Procedure: PERIPHERAL VASCULAR BALLOON ANGIOPLASTY;  Surgeon: Nada LibmanBrabham, Vance W, MD;  [facility];  Service: Cardiovascular;  Laterality: Left;  fem-pop bypass, TP trunk, PT  . PERIPHERAL VASCULAR INTERVENTION  12/06/2016   Procedure: PERIPHERAL VASCULAR INTERVENTION;  Surgeon: Fransisco Hertzhen, Brian L, MD;  [facility];  Service: Cardiovascular;;  LEFT    Allergies  Allergen Reactions  . Ciprofloxacin Rash    Current Outpatient Medications  Medication Sig Dispense Refill  . apixaban (ELIQUIS) 5 MG TABS tablet Take 1 tablet (5 mg total) by mouth 2 (two) times daily.  60 tablet 6  . aspirin EC 81 MG EC tablet Take 1 tablet (81 mg total) by mouth daily.    Marland Kitchen. atorvastatin (LIPITOR) 40 MG tablet Take 1 tablet (40 mg total) by mouth daily at 6 PM. 60 tablet 6  . benazepril (LOTENSIN) 10 MG tablet Take 1 tablet (10 mg total) by mouth daily. 30 tablet 6  . buPROPion (WELLBUTRIN SR) 200 MG 12 hr tablet Take 200 mg by mouth every morning.  4  . clopidogrel (PLAVIX) 75 MG tablet Take 1 tablet (75 mg total) by mouth daily. 90 tablet 3  . gabapentin (NEURONTIN) 300 MG capsule Take 1 capsule (300 mg total) by mouth 3 (three) times daily. (Patient taking differently: Take 300 mg by mouth 2 (two) times daily. ) 90 capsule 11  . glimepiride (AMARYL) 4 MG tablet Take 1 tablet (4 mg total) by mouth daily with breakfast. 30 tablet 6  . HYDROcodone-acetaminophen (NORCO/VICODIN) 5-325 MG tablet Take 1 tablet by mouth every 6 (six) hours as needed for moderate pain. 20 tablet 0  . levothyroxine (SYNTHROID, LEVOTHROID) 50 MCG tablet Take 50 mcg by mouth daily before breakfast.     . metoprolol tartrate (LOPRESSOR) 50 MG tablet Take 1 tablet (50 mg total) by mouth 2 (two) times daily. 60 tablet 6  . sertraline (ZOLOFT) 50 MG tablet Take 50 mg by mouth at bedtime.  0  . sitaGLIPtin-metformin (JANUMET) 50-1000 MG tablet Take 1 tablet by mouth 2 (two) times daily.     No current facility-administered medications for this visit.     ROS: See HPI for pertinent positives and negatives.   Physical Examination  Vitals:   08/29/18 1329  BP: 103/62  Pulse: 83  Resp: 16  Temp: 97.6 F (36.4 C)  TempSrc: Temporal  Weight: 174 lb (78.9 kg)  Height: 6\' 1"  (1.854 m)   Body mass index is 22.96 kg/m.  General: A&O x 3, WDWN, male. Gait: limp HEENT: No gross abnormalities.  Pulmonary: Respirations are non labored, CTAB, good air movement in all fields Cardiac: regular rhythm, no detected murmur.         Carotid Bruits Right Left   Negative Negative   Radial pulses are 1+  palpable bilaterally   Adominal aortic pulse is not palpable                         VASCULAR EXAM: Extremities with ischemic changes, with Gangrene; with open wounds: lateral left foot at base of left toe, foul smelling moist ulcer. See photos below  Left foot  Left foot, wet gangrene lesion, malodorous. Moderate erythema at lateral and dorsal aspect of left foot.  LE Pulses Right Left       FEMORAL  not palpable  2+ palpable        POPLITEAL  not palpable   not palpable       POSTERIOR TIBIAL  not palpable   not palpable        DORSALIS PEDIS      ANTERIOR TIBIAL not palpable  not palpable    Abdomen: soft, NT, no palpable masses. Skin: no rashes, no cellulitis, no ulcers noted. Musculoskeletal: no muscle wasting or atrophy.  Neurologic: A&O X 3; appropriate affect, Sensation is normal; MOTOR FUNCTION:  moving all extremities equally, motor strength 5/5 throughout. Speech is fluent/normal. CN 2-12 intact. Psychiatric: Thought content is normal, mood appropriate for clinical situation.     ASSESSMENT: Willie Morales is a 58 y.o. male who is s/p mechanical thrombectomy of the left femoral-popliteal bypass graft, tibioperoneal trunk, and posterior tibial artery, balloon angioplasty of the right posterior tibial, tibioperoneal trunk, popliteal, and femoral-popliteal bypass graft, and intra-arterial administration of nitroglycerin on 08-15-18 by Dr. Myra GianottiBrabham. The patient has previously undergone a femoral-popliteal bypass graft with Gore-Tex.  This occluded about 6 months prior.  Blood flow was reestablished and he had stenting of his posterior tibial and peroneal arteries.  He came the night prior with an ischemic leg and a lytics catheter was placed.  He received TPA overnight.  Dr. Edilia Boickson brought him back that day for a follow-up study.  Imaging revealed residual thrombus within  the bypass graft and Dr. Myra GianottiBrabham was asked to assist with intervention.  Wet gangrene at base of left 5th toe: Keflex 500 mg po qid x 10 days, disp #40, 0 refills.  Dr. Randie Heinzain spoke with and examined pt. Will schedule for left 5th toe amputation and debridement of surrounding necrotic tissue ASAP next week, 1st available [facility] surgeon.  Silvadene dressing applied to wound.    ABI's today show mild disease in the left with 82 toe pressure, mono and triphasic waveforms in the left. Moderate diease in the right with monophasic waveforms.   He quit smoking 9 days ago. His other atherosclerotic risk factors include DM (possibly uncontrolled) and CAD. He takes Eliquis, Plavix, ASA, and a statin.   DATA  ABI (Date: 08/29/2018): ABI Findings: +---------+------------------+-----+----------+--------+ Right    Rt Pressure (mmHg)IndexWaveform  Comment  +---------+------------------+-----+----------+--------+ Brachial 150                                       +---------+------------------+-----+----------+--------+ PTA      90                0.58 monophasic         +---------+------------------+-----+----------+--------+ DP       105               0.67 monophasic         +---------+------------------+-----+----------+--------+ Great Toe74                0.47 Abnormal           +---------+------------------+-----+----------+--------+  +---------+------------------+-----+----------+-------+ Left     Lt Pressure (mmHg)IndexWaveform  Comment +---------+------------------+-----+----------+-------+ Brachial 156                                      +---------+------------------+-----+----------+-------+ PTA      135  0.87 monophasic        +---------+------------------+-----+----------+-------+ DP       134               0.86 triphasic         +---------+------------------+-----+----------+-------+ Great Toe82                0.53  Abnormal          +---------+------------------+-----+----------+-------+  +-------+-----------+-----------+------------+------------+ ABI/TBIToday's ABIToday's TBIPrevious ABIPrevious TBI +-------+-----------+-----------+------------+------------+ Right  0.67       0.47       0.46        0.41         +-------+-----------+-----------+------------+------------+ Left   0.87       0.53       0.90        0.74         +-------+-----------+-----------+------------+------------+  Right ABIs appear increased compared to prior study on 07/17/2018. Left ABIs and TBIs appear essentially unchanged compared to prior study on 07/17/2018. Right TBIs appear essentially unchanged compared to prior study on 07/17/2018.   Summary: Right: Resting right ankle-brachial index indicates moderate right lower extremity arterial disease. The right toe-brachial index is abnormal. RT great toe pressure = 74 mmHg. Left: Resting left ankle-brachial index indicates mild left lower extremity arterial disease. The left toe-brachial index is abnormal. LT Great toe pressure = 82 mmHg.   PLAN:  Will schedule for left 5th toe amputation and debridement of surrounding necrotic tissue ASAP next week, 1st available [facility] surgeon.   Carotid Duplex about December 2020.   I discussed in depth with the patient the nature of atherosclerosis, and emphasized the importance of maximal medical management including strict control of blood pressure, blood glucose, and lipid levels, obtaining regular exercise, and continued cessation of smoking.  The patient is aware that without maximal medical management the underlying atherosclerotic disease process will progress, limiting the benefit of any interventions.  The patient was given information about PAD including signs, symptoms, treatment, what symptoms should prompt the patient to seek immediate medical care, and risk reduction measures to take.  Charisse MarchSuzanne Clerence Gubser, RN,  MSN, FNP-C [facility] [place] Office Phone: [phone]  Clinic MD: Randie HeinzCain  08/29/18 1:51 PM

## 2018-08-29 NOTE — Progress Notes (Signed)
Unable to reach patient after several attempts. LMOM - pt. informed of the [place] that is currently in effect.   PCP - None in EPIC Cardiologist - Dr Kathlyn [place]  EKG - 01/24/18 ECHO - 01/26/18  . Do not take oral diabetes medicines (pills) the morning of surgery. (janumet , glimepiride)  Do not take glimepiride on Sunday night or Monday morning.  . If your blood sugar is less than 70 mg/dL, you will need to treat for low blood sugar: o Do not take insulin. o Treat a low blood sugar (less than 70 mg/dL) with  cup of clear juice (cranberry or apple), 4 glucose tablets, OR glucose gel. o Recheck blood sugar in 15 minutes after treatment (to make sure it is greater than 70 mg/dL). If your blood sugar is not greater than 70 mg/dL on recheck, call [phone] for further instructions.  Blood Thinner Instructions: Follow your MD's instruction for Plavix and Eliquis medications prior to surgey. (See 08/29/18 at 1513 progress note from Lovena Le, RN from MD's office).  Anesthesia review: yes - Dr Smith Robert.  Reviewed patient's history/medications, 01/24/2018 EKG and 01/26/2018 Echo.  Verbal order to obtain EKG on DOS. EKG slip placed on patient's chart per Dr. Smith Robert verbal order.   STOP now taking any Aleve, Naproxen, Ibuprofen, Motrin, Advil, Goody's, BC's, all herbal medications, fish oil, and all vitamins.

## 2018-08-29 NOTE — H&P (View-Only) (Signed)
[facility] [place]   CC: Follow up peripheral artery occlusive disease  History of Present Illness Willie Morales is a 58 y.o. male who is s/p mechanical thrombectomy of the left femoral-popliteal bypass graft, tibioperoneal trunk, and posterior tibial artery, balloon angioplasty of the right posterior tibial, tibioperoneal trunk, popliteal, and femoral-popliteal bypass graft, and intra-arterial administration of nitroglycerin on 08-15-18 by Dr. Trula Slade. The patient has previously undergone a femoral-popliteal bypass graft with Gore-Tex.  This occluded about 6 months prior.  Blood flow was reestablished and he had stenting of his posterior tibial and peroneal arteries.  He came the night prior with an ischemic leg and a lytics catheter was placed.  He received TPA overnight.  Dr. Scot Dock brought him back that day for a follow-up study.  Imaging revealed residual thrombus within the bypass graft and Dr. Trula Slade was asked to assist with intervention.  Pt returns today for follow up.   He states the sore on his left foot and the swelling in his left foot started shortly after the last above intervention. He states moderate pain only in his left 5th toe. He denies fever or chills.   Diabetic: Yes, most recent A1C result on file was 7.6 on 01-24-18 Tobacco use: former smoker  (1/2 ppd x 35 yrs), quit about 08-20-18.   Pt meds include: Statin :Yes Betablocker: Yes ASA: Yes Other anticoagulants/antiplatelets: Eliquis, Plavix  Past Medical History:  Diagnosis Date  . Carotid artery disease (HCC)    a. 60-70% RICA, totally occluded LICA, > 35% stenosed bilat ECA 04/2017.  Marland Kitchen Chronic atrial fibrillation   . Coronary artery disease    a. remote cath in 2009 showing 30% mRCA, 30% PDA, 45% LAD, 30% diag.  . Diabetes mellitus   . Dyslipidemia   . History of kidney stones   . Hypertension   . PAD (peripheral artery disease) ([place])   . Tobacco abuse     Social History Social  History   Tobacco Use  . Smoking status: Current Every Day Smoker    Packs/day: 0.50    Years: 35.00    Pack years: 17.50    Types: Cigarettes  . Smokeless tobacco: Never Used  . Tobacco comment: 2-3 cigarettes per day  Substance Use Topics  . Alcohol use: No  . Drug use: No    Family History Family History  Problem Relation Age of Onset  . Coronary artery disease Father        CABG in early 64's  . Heart attack Father   . Heart disease Father   . Cancer Mother   . Heart disease Mother   . Heart disease Brother   . Diabetes Maternal Grandmother   . SIDS Sister   . Colon cancer Neg Hx     Past Surgical History:  Procedure Laterality Date  . ABDOMINAL AORTOGRAM W/LOWER EXTREMITY N/A 07/05/2016   Procedure: Abdominal Aortogram w/Lower Extremity;  Surgeon: Conrad [place], MD;  Location: [place] [facility];  Service: Cardiovascular;  Laterality: N/A;  . ABDOMINAL AORTOGRAM W/LOWER EXTREMITY N/A 05/06/2017   Procedure: ABDOMINAL AORTOGRAM W/LOWER EXTREMITY;  Surgeon: Waynetta Sandy, MD;  Location: [place] [facility];  Service: Cardiovascular;  Laterality: N/A;  . ABDOMINAL AORTOGRAM W/LOWER EXTREMITY N/A 08/15/2018   Procedure: ABDOMINAL AORTOGRAM W/LOWER EXTREMITY;  Surgeon: Serafina Mitchell, MD;  Location: [place] [facility];  Service: Cardiovascular;  Laterality: N/A;  . APPLICATION OF WOUND VAC Left 01/29/2018   Procedure: APPLICATION OF WOUND  VAC LEFT LOWER LEG MEDIAL AND LATERAL SIDES;  Surgeon: Nada LibmanBrabham, Vance W, MD;  [facility];  Service: Vascular;  Laterality: Left;  . BACK SURGERY  1991  . CARDIAC CATHETERIZATION  07/31/2007   This showed nonobstructive coronary artery disease   He had 30% lesion in the mid RCA, 30% lesion in the PDA, 45% lesion in the LAD and mid  LAD, and 30% lesion in the diagonal.  The left circumflex was normal.  The LV function was 60%.     Marland Kitchen. ENDARTERECTOMY FEMORAL Left 05/09/2017   Procedure: ENDARTERECTOMY FEMORAL WITH DACRON PATCH  ANGIOPLASTY;  Surgeon: Fransisco Hertzhen, Brian L, MD;  [facility];  Service: Vascular;  Laterality: Left;  . ENDARTERECTOMY POPLITEAL  09/17/2017   Procedure: ENDARTERECTOMY POPLITEAL ARTERY AND TIBIAL FIBULAR TRUNK;  Surgeon: Chuck Hintickson, Christopher S, MD;  [facility];  Service: Vascular;;  . FASCIOTOMY CLOSURE Left 01/24/2018   Procedure: FASCIOTOMY OF LOWER LEFT LEG;  Surgeon: Nada LibmanBrabham, Vance W, MD;  [facility];  Service: Vascular;  Laterality: Left;  . FASCIOTOMY CLOSURE Left 01/29/2018   Procedure: FASCIOTOMY CLOSURE LEFT LOWER EXTREMITY;  Surgeon: Nada LibmanBrabham, Vance W, MD;  [facility];  Service: Vascular;  Laterality: Left;  . FEMORAL-POPLITEAL BYPASS GRAFT Left 05/09/2017   Procedure: BYPASS GRAFT FEMORAL-POPLITEAL ARTERY LEFT;  Surgeon: Fransisco Hertzhen, Brian L, MD;  [facility];  Service: Vascular;  Laterality: Left;  . FEMORAL-POPLITEAL BYPASS GRAFT Left 09/17/2017   Procedure: THROMBECTOMY LEFT FEMORAL-POPLITEAL BYPASS GRAFT;  Surgeon: Chuck Hintickson, Christopher S, MD;  [facility];  Service: Vascular;  Laterality: Left;  . KIDNEY STONE SURGERY  2011  . LOWER EXTREMITY ANGIOGRAM Left 01/23/2018   Procedure: LEFT LOWER EXTREMITY ANGIOGRAM, THROMBOLYSIS Catheter Placement;  Surgeon: Cephus Shellinglark, Christopher J, MD;  [facility];  Service: Vascular;  Laterality: Left;  . LOWER EXTREMITY ANGIOGRAM Left 08/14/2018   Procedure: Left LOWER EXTREMITY ANGIOGRAM, Lysis Catheter Placement;  Surgeon: Maeola Harmanain, Brandon Christopher, MD;  [facility];  Service: Vascular;  Laterality: Left;  . LOWER EXTREMITY ANGIOGRAPHY N/A 12/06/2016   Procedure: Lower Extremity Angiography;  Surgeon: Fransisco Hertzhen, Brian L, MD;  [facility];  Service: Cardiovascular;  Laterality: N/A;  . LOWER EXTREMITY ANGIOGRAPHY N/A 09/05/2017   Procedure: LOWER EXTREMITY ANGIOGRAPHY;  Surgeon: Fransisco Hertzhen, Brian L, MD;  [facility];  Service: Cardiovascular;  Laterality: N/A;  . PATCH ANGIOPLASTY Left 05/09/2017   Procedure:  PATCH ANGIOPLASTY WITH PROPATEN GRAFT  OF BELOW KNEE POPLITEAL ARTERY;  Surgeon: Fransisco Hertzhen, Brian L, MD;  [facility];  Service: Vascular;  Laterality: Left;  . PATCH ANGIOPLASTY Left 09/17/2017   Procedure: PATCH ANGIOPLASTY WITH PERICARDIAL BOVINE PATCH OF DISTAL ANASTOMOSIS;  Surgeon: Chuck Hintickson, Christopher S, MD;  [facility];  Service: Vascular;  Laterality: Left;  . PERCUTANEOUS VENOUS THROMBECTOMY,LYSIS WITH INTRAVASCULAR ULTRASOUND (IVUS) Left 01/24/2018   Procedure: Lysis Re-check;  Surgeon: Nada LibmanBrabham, Vance W, MD;  [facility];  Service: Vascular;  Laterality: Left;  . PERIPHERAL VASCULAR ATHERECTOMY Right 07/19/2016   Procedure: Peripheral Vascular Atherectomy;  Surgeon: Fransisco Hertzhen, Brian L, MD;  [facility];  Service: Cardiovascular;  Laterality: Right;  . PERIPHERAL VASCULAR BALLOON ANGIOPLASTY Left 07/05/2016   Procedure: Peripheral Vascular Balloon Angioplasty;  Surgeon: Fransisco Hertzhen, Brian L, MD;  [facility];  Service: Cardiovascular;  Laterality: Left;  SFA  . PERIPHERAL VASCULAR BALLOON ANGIOPLASTY Left 09/05/2017   Procedure: PERIPHERAL VASCULAR BALLOON ANGIOPLASTY;  Surgeon: Fransisco Hertzhen, Brian L, MD;  [facility];  Service: Cardiovascular;  Laterality: Left;  Popliteal, profunda femoral, external iliac  . PERIPHERAL VASCULAR BALLOON ANGIOPLASTY Left 08/15/2018   Procedure: PERIPHERAL VASCULAR BALLOON ANGIOPLASTY;  Surgeon: Serafina Mitchell, MD;  Location: [place] [facility];  Service: Cardiovascular;  Laterality: Left;  fem-pop bypass, TP trunk, PT  . PERIPHERAL VASCULAR INTERVENTION  12/06/2016   Procedure: PERIPHERAL VASCULAR INTERVENTION;  Surgeon: Conrad Alpine, MD;  Location: [place] [facility];  Service: Cardiovascular;;  LEFT    Allergies  Allergen Reactions  . Ciprofloxacin Rash    Current Outpatient Medications  Medication Sig Dispense Refill  . apixaban (ELIQUIS) 5 MG TABS tablet Take 1 tablet (5 mg total) by mouth 2 (two) times daily.  60 tablet 6  . aspirin EC 81 MG EC tablet Take 1 tablet (81 mg total) by mouth daily.    Marland Kitchen atorvastatin (LIPITOR) 40 MG tablet Take 1 tablet (40 mg total) by mouth daily at 6 PM. 60 tablet 6  . benazepril (LOTENSIN) 10 MG tablet Take 1 tablet (10 mg total) by mouth daily. 30 tablet 6  . buPROPion (WELLBUTRIN SR) 200 MG 12 hr tablet Take 200 mg by mouth every morning.  4  . clopidogrel (PLAVIX) 75 MG tablet Take 1 tablet (75 mg total) by mouth daily. 90 tablet 3  . gabapentin (NEURONTIN) 300 MG capsule Take 1 capsule (300 mg total) by mouth 3 (three) times daily. (Patient taking differently: Take 300 mg by mouth 2 (two) times daily. ) 90 capsule 11  . glimepiride (AMARYL) 4 MG tablet Take 1 tablet (4 mg total) by mouth daily with breakfast. 30 tablet 6  . HYDROcodone-acetaminophen (NORCO/VICODIN) 5-325 MG tablet Take 1 tablet by mouth every 6 (six) hours as needed for moderate pain. 20 tablet 0  . levothyroxine (SYNTHROID, LEVOTHROID) 50 MCG tablet Take 50 mcg by mouth daily before breakfast.     . metoprolol tartrate (LOPRESSOR) 50 MG tablet Take 1 tablet (50 mg total) by mouth 2 (two) times daily. 60 tablet 6  . sertraline (ZOLOFT) 50 MG tablet Take 50 mg by mouth at bedtime.  0  . sitaGLIPtin-metformin (JANUMET) 50-1000 MG tablet Take 1 tablet by mouth 2 (two) times daily.     No current facility-administered medications for this visit.     ROS: See HPI for pertinent positives and negatives.   Physical Examination  Vitals:   08/29/18 1329  BP: 103/62  Pulse: 83  Resp: 16  Temp: 97.6 F (36.4 C)  TempSrc: Temporal  Weight: 174 lb (78.9 kg)  Height: 6\' 1"  (1.854 m)   Body mass index is 22.96 kg/m.  General: A&O x 3, WDWN, male. Gait: limp HEENT: No gross abnormalities.  Pulmonary: Respirations are non labored, CTAB, good air movement in all fields Cardiac: regular rhythm, no detected murmur.         Carotid Bruits Right Left   Negative Negative   Radial pulses are 1+  palpable bilaterally   Adominal aortic pulse is not palpable                         VASCULAR EXAM: Extremities with ischemic changes, with Gangrene; with open wounds: lateral left foot at base of left toe, foul smelling moist ulcer. See photos below  Right foot  Right foot, wet gangrene lesion, malodorous. Moderate erythema at lateral and dorsal aspect of left foot.  LE Pulses Right Left       FEMORAL  not palpable  2+ palpable        POPLITEAL  not palpable   not palpable       POSTERIOR TIBIAL  not palpable   not palpable        DORSALIS PEDIS      ANTERIOR TIBIAL not palpable  not palpable    Abdomen: soft, NT, no palpable masses. Skin: no rashes, no cellulitis, no ulcers noted. Musculoskeletal: no muscle wasting or atrophy.  Neurologic: A&O X 3; appropriate affect, Sensation is normal; MOTOR FUNCTION:  moving all extremities equally, motor strength 5/5 throughout. Speech is fluent/normal. CN 2-12 intact. Psychiatric: Thought content is normal, mood appropriate for clinical situation.     ASSESSMENT: Willie Morales is a 58 y.o. male who is s/p mechanical thrombectomy of the left femoral-popliteal bypass graft, tibioperoneal trunk, and posterior tibial artery, balloon angioplasty of the right posterior tibial, tibioperoneal trunk, popliteal, and femoral-popliteal bypass graft, and intra-arterial administration of nitroglycerin on 08-15-18 by Dr. Myra GianottiBrabham. The patient has previously undergone a femoral-popliteal bypass graft with Gore-Tex.  This occluded about 6 months prior.  Blood flow was reestablished and he had stenting of his posterior tibial and peroneal arteries.  He came the night prior with an ischemic leg and a lytics catheter was placed.  He received TPA overnight.  Dr. Edilia Boickson brought him back that day for a follow-up study.  Imaging revealed residual thrombus  within the bypass graft and Dr. Myra GianottiBrabham was asked to assist with intervention.  Wet gangrene at base of left 5th toe: Keflex 500 mg po qid x 10 days, disp #40, 0 refills.  Dr. Randie Heinzain spoke with and examined pt. Will schedule for left 5th toe amputation and debridement of surrounding necrotic tissue ASAP next week, 1st available [facility] surgeon.  Silvadene dressing applied to wound.    ABI's today show mild disease in the left with 82 toe pressure, mono and triphasic waveforms in the left. Moderate diease in the right with monophasic waveforms.   He quit smoking 9 days ago. His other atherosclerotic risk factors include DM (possibly uncontrolled) and CAD. He takes Eliquis, Plavix, ASA, and a statin.   DATA  ABI (Date: 08/29/2018): ABI Findings: +---------+------------------+-----+----------+--------+ Right    Rt Pressure (mmHg)IndexWaveform  Comment  +---------+------------------+-----+----------+--------+ Brachial 150                                       +---------+------------------+-----+----------+--------+ PTA      90                0.58 monophasic         +---------+------------------+-----+----------+--------+ DP       105               0.67 monophasic         +---------+------------------+-----+----------+--------+ Great Toe74                0.47 Abnormal           +---------+------------------+-----+----------+--------+  +---------+------------------+-----+----------+-------+ Left     Lt Pressure (mmHg)IndexWaveform  Comment +---------+------------------+-----+----------+-------+ Brachial 156                                      +---------+------------------+-----+----------+-------+ PTA      135  0.87 monophasic        +---------+------------------+-----+----------+-------+ DP       134               0.86 triphasic         +---------+------------------+-----+----------+-------+ Great Toe82                 0.53 Abnormal          +---------+------------------+-----+----------+-------+  +-------+-----------+-----------+------------+------------+ ABI/TBIToday's ABIToday's TBIPrevious ABIPrevious TBI +-------+-----------+-----------+------------+------------+ Right  0.67       0.47       0.46        0.41         +-------+-----------+-----------+------------+------------+ Left   0.87       0.53       0.90        0.74         +-------+-----------+-----------+------------+------------+  Right ABIs appear increased compared to prior study on 07/17/2018. Left ABIs and TBIs appear essentially unchanged compared to prior study on 07/17/2018. Right TBIs appear essentially unchanged compared to prior study on 07/17/2018.   Summary: Right: Resting right ankle-brachial index indicates moderate right lower extremity arterial disease. The right toe-brachial index is abnormal. RT great toe pressure = 74 mmHg. Left: Resting left ankle-brachial index indicates mild left lower extremity arterial disease. The left toe-brachial index is abnormal. LT Great toe pressure = 82 mmHg.   PLAN:  Will schedule for left 5th toe amputation and debridement of surrounding necrotic tissue ASAP next week, 1st available [facility] surgeon.   Carotid Duplex about December 2020.   I discussed in depth with the patient the nature of atherosclerosis, and emphasized the importance of maximal medical management including strict control of blood pressure, blood glucose, and lipid levels, obtaining regular exercise, and continued cessation of smoking.  The patient is aware that without maximal medical management the underlying atherosclerotic disease process will progress, limiting the benefit of any interventions.  The patient was given information about PAD including signs, symptoms, treatment, what symptoms should prompt the patient to seek immediate medical care, and risk reduction measures to take.  Charisse MarchSuzanne Nickel,  RN, MSN, FNP-C [facility] [place] Office Phone: [phone]  Clinic MD: Randie HeinzCain  08/29/18 1:51 PM

## 2018-08-29 NOTE — Progress Notes (Signed)
Patient instructed to hold Plavix and Eliquis for surgery on 09/01/2018. All other instructions per letter. Verbalized understanding.

## 2018-08-30 LAB — SARS CORONAVIRUS 2 (TAT 6-24 HRS): SARS Coronavirus 2: NEGATIVE

## 2018-09-01 ENCOUNTER — Ambulatory Visit (HOSPITAL_COMMUNITY): Payer: BC Managed Care – PPO | Admitting: Certified Registered"

## 2018-09-01 ENCOUNTER — Inpatient Hospital Stay (HOSPITAL_COMMUNITY)
Admission: AD | Admit: 2018-09-01 | Discharge: 2018-09-03 | DRG: 240 | Disposition: A | Discharge status: Home Health | Payer: BC Managed Care – PPO | Attending: Vascular Surgery | Admitting: Vascular Surgery

## 2018-09-01 ENCOUNTER — Encounter (HOSPITAL_COMMUNITY): Payer: Self-pay

## 2018-09-01 ENCOUNTER — Encounter (HOSPITAL_COMMUNITY)
Admission: AD | Disposition: A | Discharge status: Home Health | Payer: Self-pay | Source: Home / Self Care | Attending: Vascular Surgery

## 2018-09-01 ENCOUNTER — Other Ambulatory Visit: Payer: Self-pay

## 2018-09-01 DIAGNOSIS — I96 Gangrene, not elsewhere classified: Secondary | ICD-10-CM | POA: Diagnosis not present

## 2018-09-01 DIAGNOSIS — I2510 Atherosclerotic heart disease of native coronary artery without angina pectoris: Secondary | ICD-10-CM | POA: Diagnosis present

## 2018-09-01 DIAGNOSIS — Z7901 Long term (current) use of anticoagulants: Secondary | ICD-10-CM

## 2018-09-01 DIAGNOSIS — Z79890 Hormone replacement therapy (postmenopausal): Secondary | ICD-10-CM | POA: Diagnosis not present

## 2018-09-01 DIAGNOSIS — F17210 Nicotine dependence, cigarettes, uncomplicated: Secondary | ICD-10-CM | POA: Diagnosis present

## 2018-09-01 DIAGNOSIS — I10 Essential (primary) hypertension: Secondary | ICD-10-CM | POA: Diagnosis present

## 2018-09-01 DIAGNOSIS — Z7984 Long term (current) use of oral hypoglycemic drugs: Secondary | ICD-10-CM

## 2018-09-01 DIAGNOSIS — Z7902 Long term (current) use of antithrombotics/antiplatelets: Secondary | ICD-10-CM | POA: Diagnosis not present

## 2018-09-01 DIAGNOSIS — I482 Chronic atrial fibrillation, unspecified: Secondary | ICD-10-CM | POA: Diagnosis present

## 2018-09-01 DIAGNOSIS — Z833 Family history of diabetes mellitus: Secondary | ICD-10-CM | POA: Diagnosis not present

## 2018-09-01 DIAGNOSIS — Z1159 Encounter for screening for other viral diseases: Secondary | ICD-10-CM | POA: Diagnosis not present

## 2018-09-01 DIAGNOSIS — M7989 Other specified soft tissue disorders: Secondary | ICD-10-CM | POA: Diagnosis present

## 2018-09-01 DIAGNOSIS — E1152 Type 2 diabetes mellitus with diabetic peripheral angiopathy with gangrene: Principal | ICD-10-CM | POA: Diagnosis present

## 2018-09-01 DIAGNOSIS — Z79899 Other long term (current) drug therapy: Secondary | ICD-10-CM

## 2018-09-01 DIAGNOSIS — Z7982 Long term (current) use of aspirin: Secondary | ICD-10-CM

## 2018-09-01 DIAGNOSIS — Z881 Allergy status to other antibiotic agents status: Secondary | ICD-10-CM | POA: Diagnosis not present

## 2018-09-01 DIAGNOSIS — Z87442 Personal history of urinary calculi: Secondary | ICD-10-CM | POA: Diagnosis not present

## 2018-09-01 DIAGNOSIS — E785 Hyperlipidemia, unspecified: Secondary | ICD-10-CM | POA: Diagnosis present

## 2018-09-01 DIAGNOSIS — Z8249 Family history of ischemic heart disease and other diseases of the circulatory system: Secondary | ICD-10-CM | POA: Diagnosis not present

## 2018-09-01 DIAGNOSIS — E039 Hypothyroidism, unspecified: Secondary | ICD-10-CM | POA: Diagnosis present

## 2018-09-01 HISTORY — PX: AMPUTATION: SHX166

## 2018-09-01 LAB — BASIC METABOLIC PANEL
Anion gap: 11 (ref 5–15)
BUN: 10 mg/dL (ref 6–20)
CO2: 23 mmol/L (ref 22–32)
Calcium: 8.7 mg/dL — ABNORMAL LOW (ref 8.9–10.3)
Chloride: 98 mmol/L (ref 98–111)
Creatinine, Ser: 0.94 mg/dL (ref 0.61–1.24)
GFR calc Af Amer: >60 mL/min (ref >60)
GFR calc non Af Amer: >60 mL/min (ref >60)
Glucose, Bld: 230 mg/dL — ABNORMAL HIGH (ref 70–99)
Potassium: 3.5 mmol/L (ref 3.5–5.1)
Sodium: 132 mmol/L — ABNORMAL LOW (ref 135–145)

## 2018-09-01 LAB — CBC
HCT: 35.5 % — ABNORMAL LOW (ref 39.0–52.0)
Hemoglobin: 11.4 g/dL — ABNORMAL LOW (ref 13.0–17.0)
MCH: 28.9 pg (ref 26.0–34.0)
MCHC: 32.1 g/dL (ref 30.0–36.0)
MCV: 90.1 fL (ref 80.0–100.0)
Platelets: 244 10*3/uL (ref 150–400)
RBC: 3.94 MIL/uL — ABNORMAL LOW (ref 4.22–5.81)
RDW: 13.9 % (ref 11.5–15.5)
WBC: 18.2 10*3/uL — ABNORMAL HIGH (ref 4.0–10.5)
nRBC: 0 % (ref 0.0–0.2)

## 2018-09-01 LAB — PROTIME-INR
INR: 1.3 — ABNORMAL HIGH (ref 0.8–1.2)
Prothrombin Time: 15.6 seconds — ABNORMAL HIGH (ref 11.4–15.2)

## 2018-09-01 LAB — GLUCOSE, CAPILLARY
Glucose-Capillary: 236 mg/dL — ABNORMAL HIGH (ref 70–99)
Glucose-Capillary: 230 mg/dL — ABNORMAL HIGH (ref 70–99)
Glucose-Capillary: 221 mg/dL — ABNORMAL HIGH (ref 70–99)
Glucose-Capillary: 208 mg/dL — ABNORMAL HIGH (ref 70–99)

## 2018-09-01 SURGERY — AMPUTATION DIGIT
Anesthesia: General | Site: Foot | Laterality: Left

## 2018-09-01 MED ORDER — PROPOFOL 10 MG/ML IV BOLUS
INTRAVENOUS | Status: DC | PRN
  Administered 2018-09-01: 60 mg via INTRAVENOUS
  Administered 2018-09-01: 140 mg via INTRAVENOUS

## 2018-09-01 MED ORDER — ASPIRIN EC 81 MG PO TBEC
81.0000 mg | DELAYED_RELEASE_TABLET | Freq: Every day | ORAL | Status: DC
  Administered 2018-09-01 – 2018-09-03 (×3): 81 mg via ORAL
  Filled 2018-09-01 (×5): qty 1

## 2018-09-01 MED ORDER — 0.9 % SODIUM CHLORIDE (POUR BTL) OPTIME
TOPICAL | Status: DC | PRN
  Administered 2018-09-01: 1000 mL

## 2018-09-01 MED ORDER — LIDOCAINE 2% (20 MG/ML) 5 ML SYRINGE
INTRAMUSCULAR | Status: AC
  Filled 2018-09-01: qty 5

## 2018-09-01 MED ORDER — FENTANYL CITRATE (PF) 250 MCG/5ML IJ SOLN
INTRAMUSCULAR | Status: AC
  Filled 2018-09-01: qty 5

## 2018-09-01 MED ORDER — ROCURONIUM BROMIDE 10 MG/ML (PF) SYRINGE
PREFILLED_SYRINGE | INTRAVENOUS | Status: AC
  Filled 2018-09-01: qty 10

## 2018-09-01 MED ORDER — METOPROLOL TARTRATE 5 MG/5ML IV SOLN: 2.0000 mg | INTRAVENOUS | Status: DC | PRN

## 2018-09-01 MED ORDER — DEXAMETHASONE SODIUM PHOSPHATE 10 MG/ML IJ SOLN
INTRAMUSCULAR | Status: AC
  Filled 2018-09-01: qty 1

## 2018-09-01 MED ORDER — ACETAMINOPHEN 10 MG/ML IV SOLN: 1000.0000 mg | Freq: Once | INTRAVENOUS | Status: DC | PRN

## 2018-09-01 MED ORDER — LACTATED RINGERS IV SOLN
INTRAVENOUS | Status: DC | PRN
  Administered 2018-09-01: 08:00:00 via INTRAVENOUS

## 2018-09-01 MED ORDER — FENTANYL CITRATE (PF) 100 MCG/2ML IJ SOLN
INTRAMUSCULAR | Status: DC | PRN
  Administered 2018-09-01 (×2): 25 ug via INTRAVENOUS
  Administered 2018-09-01 (×3): 50 ug via INTRAVENOUS

## 2018-09-01 MED ORDER — SODIUM CHLORIDE 0.9 % IV SOLN: 250.0000 mL | INTRAVENOUS | Status: DC | PRN

## 2018-09-01 MED ORDER — PHENOL 1.4 % MT LIQD: 1.0000 | OROMUCOSAL | Status: DC | PRN

## 2018-09-01 MED ORDER — ATORVASTATIN CALCIUM 40 MG PO TABS
40.0000 mg | ORAL_TABLET | Freq: Every day | ORAL | Status: DC
  Administered 2018-09-01 – 2018-09-02 (×2): 40 mg via ORAL
  Filled 2018-09-01 (×2): qty 1

## 2018-09-01 MED ORDER — INSULIN ASPART 100 UNIT/ML ~~LOC~~ SOLN
0.0000 [IU] | Freq: Three times a day (TID) | SUBCUTANEOUS | Status: DC
  Administered 2018-09-01: 5 [IU] via SUBCUTANEOUS
  Administered 2018-09-02 (×2): 3 [IU] via SUBCUTANEOUS

## 2018-09-01 MED ORDER — ACETAMINOPHEN 325 MG PO TABS: 325.0000 mg | ORAL_TABLET | ORAL | Status: DC | PRN

## 2018-09-01 MED ORDER — ACETAMINOPHEN 500 MG PO TABS: 1000.0000 mg | ORAL_TABLET | Freq: Once | ORAL | Status: DC | PRN

## 2018-09-01 MED ORDER — SODIUM CHLORIDE 0.9 % IV SOLN: INTRAVENOUS | Status: DC

## 2018-09-01 MED ORDER — CEFAZOLIN SODIUM-DEXTROSE 2-4 GM/100ML-% IV SOLN
2.0000 g | INTRAVENOUS | Status: AC
  Administered 2018-09-01: 08:00:00 2 g via INTRAVENOUS

## 2018-09-01 MED ORDER — CEFAZOLIN SODIUM-DEXTROSE 2-4 GM/100ML-% IV SOLN
INTRAVENOUS | Status: AC
  Filled 2018-09-01: qty 100

## 2018-09-01 MED ORDER — CHLORHEXIDINE GLUCONATE 4 % EX LIQD: 60.0000 mL | Freq: Once | CUTANEOUS | Status: DC

## 2018-09-01 MED ORDER — DEXAMETHASONE SODIUM PHOSPHATE 10 MG/ML IJ SOLN
INTRAMUSCULAR | Status: DC | PRN
  Administered 2018-09-01: 10 mg via INTRAVENOUS

## 2018-09-01 MED ORDER — MORPHINE SULFATE (PF) 2 MG/ML IV SOLN: 2.0000 mg | INTRAVENOUS | Status: DC | PRN

## 2018-09-01 MED ORDER — POTASSIUM CHLORIDE CRYS ER 20 MEQ PO TBCR
20.0000 meq | EXTENDED_RELEASE_TABLET | Freq: Once | ORAL | Status: DC
  Filled 2018-09-01: qty 1

## 2018-09-01 MED ORDER — LEVOTHYROXINE SODIUM 50 MCG PO TABS
50.0000 ug | ORAL_TABLET | Freq: Every day | ORAL | Status: DC
  Administered 2018-09-02 – 2018-09-03 (×2): 50 ug via ORAL
  Filled 2018-09-01 (×2): qty 1

## 2018-09-01 MED ORDER — OXYCODONE HCL 5 MG PO TABS: 5.0000 mg | ORAL_TABLET | Freq: Once | ORAL | Status: DC | PRN

## 2018-09-01 MED ORDER — PROPOFOL 10 MG/ML IV BOLUS
INTRAVENOUS | Status: AC
  Filled 2018-09-01: qty 20

## 2018-09-01 MED ORDER — MIDAZOLAM HCL 2 MG/2ML IJ SOLN
INTRAMUSCULAR | Status: AC
  Filled 2018-09-01: qty 2

## 2018-09-01 MED ORDER — ONDANSETRON HCL 4 MG/2ML IJ SOLN: 4.0000 mg | Freq: Four times a day (QID) | INTRAMUSCULAR | Status: DC | PRN

## 2018-09-01 MED ORDER — GUAIFENESIN-DM 100-10 MG/5ML PO SYRP: 15.0000 mL | ORAL_SOLUTION | ORAL | Status: DC | PRN

## 2018-09-01 MED ORDER — PANTOPRAZOLE SODIUM 40 MG PO TBEC
40.0000 mg | DELAYED_RELEASE_TABLET | Freq: Every day | ORAL | Status: DC
  Administered 2018-09-02 – 2018-09-03 (×2): 40 mg via ORAL
  Filled 2018-09-01 (×2): qty 1

## 2018-09-01 MED ORDER — CLOPIDOGREL BISULFATE 75 MG PO TABS
75.0000 mg | ORAL_TABLET | Freq: Every day | ORAL | Status: DC
  Administered 2018-09-02 – 2018-09-03 (×2): 75 mg via ORAL
  Filled 2018-09-01 (×2): qty 1

## 2018-09-01 MED ORDER — LABETALOL HCL 5 MG/ML IV SOLN: 10.0000 mg | INTRAVENOUS | Status: DC | PRN

## 2018-09-01 MED ORDER — OXYCODONE HCL 5 MG PO TABS
5.0000 mg | ORAL_TABLET | ORAL | Status: DC | PRN
  Administered 2018-09-02 – 2018-09-03 (×3): 10 mg via ORAL
  Filled 2018-09-01 (×3): qty 2

## 2018-09-01 MED ORDER — BUPROPION HCL ER (SR) 100 MG PO TB12
200.0000 mg | ORAL_TABLET | Freq: Every day | ORAL | Status: DC
  Administered 2018-09-02 – 2018-09-03 (×2): 200 mg via ORAL
  Filled 2018-09-01 (×3): qty 2

## 2018-09-01 MED ORDER — OXYCODONE HCL 5 MG/5ML PO SOLN: 5.0000 mg | Freq: Once | ORAL | Status: DC | PRN

## 2018-09-01 MED ORDER — LINAGLIPTIN 5 MG PO TABS
5.0000 mg | ORAL_TABLET | Freq: Every day | ORAL | Status: DC
  Administered 2018-09-02 – 2018-09-03 (×2): 5 mg via ORAL
  Filled 2018-09-01 (×2): qty 1

## 2018-09-01 MED ORDER — SODIUM CHLORIDE 0.9% FLUSH
3.0000 mL | Freq: Two times a day (BID) | INTRAVENOUS | Status: DC
  Administered 2018-09-01 – 2018-09-02 (×3): 3 mL via INTRAVENOUS

## 2018-09-01 MED ORDER — LIDOCAINE 2% (20 MG/ML) 5 ML SYRINGE
INTRAMUSCULAR | Status: DC | PRN
  Administered 2018-09-01: 70 mg via INTRAVENOUS

## 2018-09-01 MED ORDER — ACETAMINOPHEN 160 MG/5ML PO SOLN: 1000.0000 mg | Freq: Once | ORAL | Status: DC | PRN

## 2018-09-01 MED ORDER — SODIUM CHLORIDE 0.9% FLUSH: 3.0000 mL | INTRAVENOUS | Status: DC | PRN

## 2018-09-01 MED ORDER — METOPROLOL TARTRATE 50 MG PO TABS
50.0000 mg | ORAL_TABLET | Freq: Two times a day (BID) | ORAL | Status: DC
  Administered 2018-09-01 – 2018-09-03 (×4): 50 mg via ORAL
  Filled 2018-09-01 (×6): qty 1

## 2018-09-01 MED ORDER — ACETAMINOPHEN 325 MG RE SUPP: 325.0000 mg | RECTAL | Status: DC | PRN

## 2018-09-01 MED ORDER — BENAZEPRIL HCL 10 MG PO TABS
10.0000 mg | ORAL_TABLET | Freq: Every day | ORAL | Status: DC
  Filled 2018-09-01: qty 1

## 2018-09-01 MED ORDER — PIPERACILLIN-TAZOBACTAM 3.375 G IVPB
3.3750 g | Freq: Three times a day (TID) | INTRAVENOUS | Status: DC
  Administered 2018-09-01 – 2018-09-03 (×6): 3.375 g via INTRAVENOUS
  Filled 2018-09-01 (×6): qty 50

## 2018-09-01 MED ORDER — FENTANYL CITRATE (PF) 100 MCG/2ML IJ SOLN: 25.0000 ug | INTRAMUSCULAR | Status: DC | PRN

## 2018-09-01 MED ORDER — DOCUSATE SODIUM 100 MG PO CAPS
100.0000 mg | ORAL_CAPSULE | Freq: Two times a day (BID) | ORAL | Status: DC
  Administered 2018-09-01 – 2018-09-03 (×2): 100 mg via ORAL
  Filled 2018-09-01 (×5): qty 1

## 2018-09-01 MED ORDER — ONDANSETRON HCL 4 MG/2ML IJ SOLN
INTRAMUSCULAR | Status: AC
  Filled 2018-09-01: qty 2

## 2018-09-01 MED ORDER — BENAZEPRIL HCL 5 MG PO TABS
10.0000 mg | ORAL_TABLET | Freq: Every day | ORAL | Status: DC
  Administered 2018-09-02 – 2018-09-03 (×2): 10 mg via ORAL
  Filled 2018-09-01 (×2): qty 2

## 2018-09-01 MED ORDER — SITAGLIPTIN PHOS-METFORMIN HCL 50-1000 MG PO TABS: 1.0000 | ORAL_TABLET | Freq: Two times a day (BID) | ORAL | Status: DC

## 2018-09-01 MED ORDER — ONDANSETRON HCL 4 MG/2ML IJ SOLN
INTRAMUSCULAR | Status: DC | PRN
  Administered 2018-09-01: 4 mg via INTRAVENOUS

## 2018-09-01 MED ORDER — ENOXAPARIN SODIUM 40 MG/0.4ML ~~LOC~~ SOLN
40.0000 mg | SUBCUTANEOUS | Status: DC
  Administered 2018-09-01 – 2018-09-02 (×2): 40 mg via SUBCUTANEOUS
  Filled 2018-09-01 (×2): qty 0.4

## 2018-09-01 MED ORDER — GABAPENTIN 300 MG PO CAPS
300.0000 mg | ORAL_CAPSULE | Freq: Two times a day (BID) | ORAL | Status: DC
  Administered 2018-09-01 – 2018-09-03 (×4): 300 mg via ORAL
  Filled 2018-09-01 (×4): qty 1

## 2018-09-01 MED ORDER — HYDRALAZINE HCL 20 MG/ML IJ SOLN: 5.0000 mg | INTRAMUSCULAR | Status: DC | PRN

## 2018-09-01 MED ORDER — GLIMEPIRIDE 4 MG PO TABS
4.0000 mg | ORAL_TABLET | Freq: Every day | ORAL | Status: DC
  Administered 2018-09-02: 4 mg via ORAL
  Filled 2018-09-01: qty 1

## 2018-09-01 MED ORDER — ALUM & MAG HYDROXIDE-SIMETH 200-200-20 MG/5ML PO SUSP: 15.0000 mL | ORAL | Status: DC | PRN

## 2018-09-01 MED ORDER — METFORMIN HCL 500 MG PO TABS
1000.0000 mg | ORAL_TABLET | Freq: Two times a day (BID) | ORAL | Status: DC
  Administered 2018-09-01 – 2018-09-03 (×4): 1000 mg via ORAL
  Filled 2018-09-01 (×4): qty 2

## 2018-09-01 MED ORDER — PHENYLEPHRINE 40 MCG/ML (10ML) SYRINGE FOR IV PUSH (FOR BLOOD PRESSURE SUPPORT)
PREFILLED_SYRINGE | INTRAVENOUS | Status: DC | PRN
  Administered 2018-09-01: 160 ug via INTRAVENOUS
  Administered 2018-09-01: 120 ug via INTRAVENOUS
  Administered 2018-09-01: 80 ug via INTRAVENOUS
  Administered 2018-09-01: 120 ug via INTRAVENOUS

## 2018-09-01 MED ORDER — PHENYLEPHRINE 40 MCG/ML (10ML) SYRINGE FOR IV PUSH (FOR BLOOD PRESSURE SUPPORT)
PREFILLED_SYRINGE | INTRAVENOUS | Status: AC
  Filled 2018-09-01: qty 10

## 2018-09-01 MED ORDER — SERTRALINE HCL 50 MG PO TABS
50.0000 mg | ORAL_TABLET | Freq: Every day | ORAL | Status: DC
  Administered 2018-09-01 – 2018-09-02 (×2): 50 mg via ORAL
  Filled 2018-09-01 (×2): qty 1

## 2018-09-01 SURGICAL SUPPLY — 34 items
BANDAGE ACE 4X5 VEL STRL LF (GAUZE/BANDAGES/DRESSINGS) IMPLANT
BLADE SURG 10 STRL SS (BLADE) ×2 IMPLANT
BNDG GAUZE ELAST 4 BULKY (GAUZE/BANDAGES/DRESSINGS) IMPLANT
CANISTER SUCT 3000ML PPV (MISCELLANEOUS) ×2 IMPLANT
CANISTER WOUNDNEG PRESSURE 500 (CANNISTER) ×2 IMPLANT
CONT SPEC 4OZ CLIKSEAL STRL BL (MISCELLANEOUS) ×2 IMPLANT
COVER SURGICAL LIGHT HANDLE (MISCELLANEOUS) ×2 IMPLANT
COVER WAND RF STERILE (DRAPES) IMPLANT
DRAPE EXTREMITY T 121X128X90 (DISPOSABLE) ×2 IMPLANT
DRAPE HALF SHEET 40X57 (DRAPES) IMPLANT
DRSG EMULSION OIL 3X3 NADH (GAUZE/BANDAGES/DRESSINGS) ×2 IMPLANT
DRSG VAC ATS MED SENSATRAC (GAUZE/BANDAGES/DRESSINGS) ×2 IMPLANT
ELECT CAUTERY BLADE 6.4 (BLADE) ×4 IMPLANT
ELECT REM PT RETURN 9FT ADLT (ELECTROSURGICAL) ×2
ELECTRODE REM PT RTRN 9FT ADLT (ELECTROSURGICAL) ×1 IMPLANT
GAUZE SPONGE 4X4 12PLY STRL (GAUZE/BANDAGES/DRESSINGS) IMPLANT
GAUZE SPONGE 4X4 12PLY STRL LF (GAUZE/BANDAGES/DRESSINGS) ×2 IMPLANT
GLOVE BIO SURGEON STRL SZ7.5 (GLOVE) ×2 IMPLANT
GOWN STRL REUS W/ TWL LRG LVL3 (GOWN DISPOSABLE) ×3 IMPLANT
GOWN STRL REUS W/TWL LRG LVL3 (GOWN DISPOSABLE) ×3
KIT BASIN OR (CUSTOM PROCEDURE TRAY) ×2 IMPLANT
KIT TURNOVER KIT B (KITS) ×2 IMPLANT
NS IRRIG 1000ML POUR BTL (IV SOLUTION) ×2 IMPLANT
PACK GENERAL/GYN (CUSTOM PROCEDURE TRAY) ×2 IMPLANT
PAD ARMBOARD 7.5X6 YLW CONV (MISCELLANEOUS) ×4 IMPLANT
PENCIL BUTTON HOLSTER BLD 10FT (ELECTRODE) ×2 IMPLANT
SPECIMEN JAR SMALL (MISCELLANEOUS) IMPLANT
SUT ETHILON 3 0 PS 1 (SUTURE) ×2 IMPLANT
SUT VIC AB 3-0 SH 27 (SUTURE)
SUT VIC AB 3-0 SH 27X BRD (SUTURE) IMPLANT
TOWEL GREEN STERILE (TOWEL DISPOSABLE) ×4 IMPLANT
TOWEL GREEN STERILE FF (TOWEL DISPOSABLE) ×2 IMPLANT
UNDERPAD 30X30 (UNDERPADS AND DIAPERS) ×2 IMPLANT
WATER STERILE IRR 1000ML POUR (IV SOLUTION) ×2 IMPLANT

## 2018-09-01 NOTE — Anesthesia Procedure Notes (Signed)
Procedure Name: LMA Insertion Date/Time: 09/01/2018 7:42 AM Performed by: Lance Coon, CRNA Pre-anesthesia Checklist: Patient identified, Emergency Drugs available, Suction available, Patient being monitored and Timeout performed Patient Re-evaluated:Patient Re-evaluated prior to induction Oxygen Delivery Method: Circle system utilized Preoxygenation: Pre-oxygenation with 100% oxygen Induction Type: IV induction LMA: LMA inserted LMA Size: 4.0 Number of attempts: 1 Placement Confirmation: positive ETCO2 and breath sounds checked- equal and bilateral Dental Injury: Teeth and Oropharynx as per pre-operative assessment

## 2018-09-01 NOTE — Interval H&P Note (Signed)
History and Physical Interval Note:  09/01/2018 7:16 AM  Willie Morales  has presented today for surgery, with the diagnosis of peripheral vascular disease with gangrene left foot.  The various methods of treatment have been discussed with the patient and family. After consideration of risks, benefits and other options for treatment, the patient has consented to  Procedure(s): AMPUTATION LEFT FIFTH TOE AND DEBIDEMENT OF NECROTIC TISSUE AT TOE BASE (Left) as a surgical intervention.  The patient's history has been reviewed, patient examined, no change in status, stable for surgery.  I have reviewed the patient's chart and labs.  Questions were answered to the patient's satisfaction.     Ruta Hinds

## 2018-09-01 NOTE — Anesthesia Preprocedure Evaluation (Signed)
Anesthesia Evaluation  Patient identified by MRN, date of birth, ID band Patient awake    Reviewed: Allergy & Precautions, NPO status , Patient's Chart, lab work & pertinent test results, reviewed documented beta blocker date and time   Airway Mallampati: II  TM Distance: >3 FB Neck ROM: Full    Dental  (+) Missing, Poor Dentition   Pulmonary Current Smoker,    breath sounds clear to auscultation       Cardiovascular hypertension, Pt. on medications and Pt. on home beta blockers + CAD and + Peripheral Vascular Disease  + dysrhythmias  Rhythm:Irregular     Neuro/Psych negative neurological ROS  negative psych ROS   GI/Hepatic negative GI ROS,   Endo/Other  diabetes, Poorly Controlled, Type 2Hypothyroidism   Renal/GU      Musculoskeletal   Abdominal   Peds  Hematology   Anesthesia Other Findings   Reproductive/Obstetrics                             Anesthesia Physical Anesthesia Plan  ASA: III  Anesthesia Plan: General   Post-op Pain Management:    Induction: Intravenous  PONV Risk Score and Plan: 1 and Ondansetron and Dexamethasone  Airway Management Planned: LMA and Oral ETT  Additional Equipment: None  Intra-op Plan:   Post-operative Plan: Extubation in OR  Informed Consent: I have reviewed the patients History and Physical, chart, labs and discussed the procedure including the risks, benefits and alternatives for the proposed anesthesia with the patient or authorized representative who has indicated his/her understanding and acceptance.     Dental advisory given  Plan Discussed with: CRNA and Surgeon  Anesthesia Plan Comments:         Anesthesia Quick Evaluation

## 2018-09-01 NOTE — Progress Notes (Signed)
Pt arrived from PACU.  A&O x 4.  CHG bath given.  SCD present, CCMD notified x2.  Vital signs taken.  Doppler pulses.

## 2018-09-01 NOTE — Transfer of Care (Signed)
Immediate Anesthesia Transfer of Care Note  Patient: Willie Morales  Procedure(s) Performed: Left transmetatarsal amputation of toes four and five, application of wound vac (Left Foot)  Patient Location: PACU  Anesthesia Type:General  Level of Consciousness: drowsy and patient cooperative  Airway & Oxygen Therapy: Patient Spontanous Breathing  Post-op Assessment: Report given to RN and Post -op Vital signs reviewed and stable  Post vital signs: Reviewed and stable  Last Vitals:  Vitals Value Taken Time  BP 112/80 09/01/18 0843  Temp    Pulse 47 09/01/18 0845  Resp 14 09/01/18 0845  SpO2 98 % 09/01/18 0845  Vitals shown include unvalidated device data.  Last Pain:  Vitals:   09/01/18 0651  TempSrc:   PainSc: 6       Patients Stated Pain Goal: 3 (60/63/01 6010)  Complications: No apparent anesthesia complications

## 2018-09-01 NOTE — Progress Notes (Signed)
ANTICOAGULATION CONSULT NOTE - Initial Consult  Pharmacy Consult for Lovenox Indication: VTE prophylaxis  Allergies  Allergen Reactions  . Ciprofloxacin Rash    Patient Measurements: Height: 6\' 1"  (185.4 cm) Weight: 174 lb (78.9 kg) IBW/kg (Calculated) : 79.9   Vital Signs: Temp: 98.4 F (36.9 C) (07/13 0845) Temp Source: Oral (07/13 0603) BP: 105/75 (07/13 0930) Pulse Rate: 52 (07/13 0930)  Labs: Recent Labs    09/01/18 0700 09/01/18 0701  HGB 11.4*  --   HCT 35.5*  --   PLT 244  --   LABPROT  --  15.6*  INR  --  1.3*  CREATININE 0.94  --     Estimated Creatinine Clearance: 97.9 mL/min (by C-G formula based on SCr of 0.94 mg/dL).   Medical History: Past Medical History:  Diagnosis Date  . Carotid artery disease (HCC)    a. 60-70% RICA, totally occluded LICA, > 98% stenosed bilat ECA 04/2017.  Marland Kitchen Chronic atrial fibrillation   . Coronary artery disease    a. remote cath in 2009 showing 30% mRCA, 30% PDA, 45% LAD, 30% diag.  . Diabetes mellitus   . Dyslipidemia   . History of kidney stones   . Hypertension   . PAD (peripheral artery disease) ([place])   . Tobacco abuse     Assessment:  CC/HPI: Peripheral artery occlusive dz  PMH: Fem-pop BPG, carotid dz, afib, CAD, DM, HLD, HTN, PAD, tobacco Anticoag: Eliquis PTA for afib. Hgb 11.4. Plts 244. Scr WNL - 7/13: AMPUTATION LEFT FIFTH TOE AND DEBIDEMENT OF NECROTIC TISSUE AT TOE BASE    Goal of Therapy:  VTE propx. Monitor platelets by anticoagulation protocol: Yes   Plan:   Messaged MD about 40mg /d vs full-dose since on Eliquis PTA-no answer yet. - Lovenox 40mg /d for now.   Mabry Tift S. Alford Highland, PharmD, BCPS Clinical Staff Pharmacist Eilene Ghazi Stillinger 09/01/2018,10:49 AM

## 2018-09-01 NOTE — Op Note (Signed)
Procedure: Transmetatarsal amputation of toes 4 and 5 left foot, application of VAC dressing  Preoperative diagnosis: Gangrene left foot  Postoperative diagnosis: Same  Anesthesia: General  Assistant: Nurse  Operative findings: Necrotic soft tissue and muscle extending into the third metatarsal  Good skin age but minimal deep tissue bleeding, pale appearing muscle  Operative details: After obtaining form consent, the patient taken the operating room.  The patient was placed in supine position operating table.  After induction of general anesthesia patient's entire left foot was prepped and draped in usual sterile fashion.  Next a tennis racquet shaped incision was made encompassing the left fifth toe all the way up to the midportion of the metatarsal.  Upon entering the subcutaneous tissues there was a large amount of necrotic purulent material extending into the midportion of the foot.  Anaerobic and aerobic cultures were taken.  I debrided this back to healthy tissue which by the point that I got to reasonable tissue the metatarsal of the fourth toe had been exposed as well.  At this point I also incorporated the fourth toe into the resection.  Periosteal elevator was used to raise the periosteum on the fourth and fifth metatarsals.  These were both transected with a bone cutter.  Rongeurs were used to trim the bone back into the soft tissue.  The tendons were pulled down the operative field and transected and released.  The wound was thoroughly irrigated with normal saline solution.  Cautery was used to control some skin edge bleeding.  There was minimal bleeding from the deeper tissues.  The muscle was pale in appearance.  The wound was clean at the conclusion of the procedure.  However, the tissues have marginal perfusion.  A VAC dressing was placed on the lateral aspect of the foot.  It was placed on 125 mmHg continuous pressure.  Patient tolerated procedure well and there were no complications.   Prognosis for the patient's left foot is guarded.  He is at very high risk for requiring a below-knee amputation.  Ruta Hinds, MD [facility] [place] Office: [phone] Pager: [phone]

## 2018-09-02 ENCOUNTER — Encounter (HOSPITAL_COMMUNITY): Payer: Self-pay | Admitting: Vascular Surgery

## 2018-09-02 LAB — GLUCOSE, CAPILLARY
Glucose-Capillary: 168 mg/dL — ABNORMAL HIGH (ref 70–99)
Glucose-Capillary: 169 mg/dL — ABNORMAL HIGH (ref 70–99)
Glucose-Capillary: 92 mg/dL (ref 70–99)
Glucose-Capillary: 61 mg/dL — ABNORMAL LOW (ref 70–99)
Glucose-Capillary: 157 mg/dL — ABNORMAL HIGH (ref 70–99)

## 2018-09-02 LAB — COMPREHENSIVE METABOLIC PANEL
ALT: 9 U/L (ref 0–44)
AST: 10 U/L — ABNORMAL LOW (ref 15–41)
Albumin: 2.9 g/dL — ABNORMAL LOW (ref 3.5–5.0)
Alkaline Phosphatase: 67 U/L (ref 38–126)
Anion gap: 10 (ref 5–15)
BUN: 16 mg/dL (ref 6–20)
CO2: 23 mmol/L (ref 22–32)
Calcium: 9.2 mg/dL (ref 8.9–10.3)
Chloride: 103 mmol/L (ref 98–111)
Creatinine, Ser: 1.16 mg/dL (ref 0.61–1.24)
GFR calc Af Amer: >60 mL/min (ref >60)
GFR calc non Af Amer: >60 mL/min (ref >60)
Glucose, Bld: 184 mg/dL — ABNORMAL HIGH (ref 70–99)
Potassium: 4.2 mmol/L (ref 3.5–5.1)
Sodium: 136 mmol/L (ref 135–145)
Total Bilirubin: 0.9 mg/dL (ref 0.3–1.2)
Total Protein: 7.8 g/dL (ref 6.5–8.1)

## 2018-09-02 LAB — HEMOGLOBIN A1C
Hgb A1c MFr Bld: 7.9 % — ABNORMAL HIGH (ref 4.8–5.6)
Mean Plasma Glucose: 180.03 mg/dL

## 2018-09-02 LAB — CBC
HCT: 35.9 % — ABNORMAL LOW (ref 39.0–52.0)
Hemoglobin: 11.8 g/dL — ABNORMAL LOW (ref 13.0–17.0)
MCH: 29 pg (ref 26.0–34.0)
MCHC: 32.9 g/dL (ref 30.0–36.0)
MCV: 88.2 fL (ref 80.0–100.0)
Platelets: 302 10*3/uL (ref 150–400)
RBC: 4.07 MIL/uL — ABNORMAL LOW (ref 4.22–5.81)
RDW: 13.9 % (ref 11.5–15.5)
WBC: 21.5 10*3/uL — ABNORMAL HIGH (ref 4.0–10.5)
nRBC: 0 % (ref 0.0–0.2)

## 2018-09-02 LAB — HIV ANTIBODY (ROUTINE TESTING W REFLEX): HIV Screen 4th Generation wRfx: NONREACTIVE

## 2018-09-02 MED ORDER — DEXTROSE 50 % IV SOLN
INTRAVENOUS | Status: AC
  Administered 2018-09-02: 50 mL
  Filled 2018-09-02: qty 50

## 2018-09-02 NOTE — Progress Notes (Addendum)
  Progress Note    09/02/2018 7:38 AM 1 Day Post-Op  Subjective:  Wants to go home; doesn't want carb mod diet  afebrile  Vitals:   09/01/18 2017 09/02/18 0400  BP: 109/72 103/66  Pulse: 79 74  Resp: 20 19  Temp: 97.6 F (36.4 C) 98.1 F (36.7 C)  SpO2: 97% 98%    Physical Exam: General:  No distress Lungs:  Non labored Incisions:  Wound vac with good seal   CBC    Component Value Date/Time   WBC 18.2 (H) 09/01/2018 0700   RBC 3.94 (L) 09/01/2018 0700   HGB 11.4 (L) 09/01/2018 0700   HCT 35.5 (L) 09/01/2018 0700   PLT 244 09/01/2018 0700   MCV 90.1 09/01/2018 0700   MCH 28.9 09/01/2018 0700   MCHC 32.1 09/01/2018 0700   RDW 13.9 09/01/2018 0700   LYMPHSABS 2.8 08/14/2018 1954   MONOABS 1.6 (H) 08/14/2018 1954   EOSABS 0.2 08/14/2018 1954   BASOSABS 0.1 08/14/2018 1954    BMET    Component Value Date/Time   NA 132 (L) 09/01/2018 0700   K 3.5 09/01/2018 0700   CL 98 09/01/2018 0700   CO2 23 09/01/2018 0700   GLUCOSE 230 (H) 09/01/2018 0700   BUN 10 09/01/2018 0700   CREATININE 0.94 09/01/2018 0700   CALCIUM 8.7 (L) 09/01/2018 0700   GFRNONAA >60 09/01/2018 0700   GFRAA >60 09/01/2018 0700    INR    Component Value Date/Time   INR 1.3 (H) 09/01/2018 0701     Intake/Output Summary (Last 24 hours) at 09/02/2018 0738 Last data filed at 09/02/2018 0600 Gross per 24 hour  Intake 849.94 ml  Output 635 ml  Net 214.94 ml     Assessment:  58 y.o. male is s/p:  Transmetatarsal amputation of toes 4 and 5 left foot, application of VAC dressing  1 Day Post-Op  Plan: -pt doing well with wound vac in place with good seal.  Will take down tomorrow.  Pt needs to get home to dog.  ? Discharge home and f/u with Dr. Oneida Alar on Wednesday or Thursday in the office.  Will d/w Dr. Oneida Alar. -DVT prophylaxis:  Lovenox   Leontine Locket, PA-C [facility] [phone] 09/02/2018 7:38 AM  Agree with above.  Needs to stay here today Will change  VAC tomorrow.  Foot looked bad in OR.  If no real healing tomorrow may need to consider BKA or higher transmet  Ruta Hinds, MD [facility] [place]: [phone] Pager: [phone]

## 2018-09-03 LAB — GLUCOSE, CAPILLARY
Glucose-Capillary: 63 mg/dL — ABNORMAL LOW (ref 70–99)
Glucose-Capillary: 157 mg/dL — ABNORMAL HIGH (ref 70–99)
Glucose-Capillary: 83 mg/dL (ref 70–99)

## 2018-09-03 MED ORDER — APIXABAN 5 MG PO TABS
5.0000 mg | ORAL_TABLET | Freq: Two times a day (BID) | ORAL | Status: DC
  Administered 2018-09-03: 5 mg via ORAL
  Filled 2018-09-03: qty 1

## 2018-09-03 NOTE — Discharge Summary (Signed)
Discharge Summary    Willie Morales 11/12/1960 58 y.o. male  [id number]  Admission Date: 09/01/2018  Discharge Date: 09/03/2018  Physician: Sherren KernsFields, Charles E, MD  Admission Diagnosis: peripheral vascular disease with gangrene left foot   HPI:   This is a 58 y.o. male who is s/p mechanical thrombectomy of the left femoral-popliteal bypass graft, tibioperoneal trunk, and posterior tibial artery, balloon angioplasty of the right posterior tibial, tibioperoneal trunk, popliteal, and femoral-popliteal bypass graft, and intra-arterial administration of nitroglycerin on 08-15-18 by Dr. Myra GianottiBrabham. The patient has previously undergone a femoral-popliteal bypass graft with Gore-Tex. This occluded about 6 months prior. Blood flow was reestablished and he had stenting of his posterior tibial and peroneal arteries. He came the night prior with an ischemic leg and a lytics catheter was placed. He received TPA overnight. Dr. Edilia Boickson brought him back that day for a follow-up study. Imaging revealed residual thrombus within the bypass graft and Dr. Myra GianottiBrabham was asked to assist with intervention.  Pt returns today for follow up.   He states the sore on his left foot and the swelling in his left foot started shortly after the last above intervention. He states moderate pain only in his left 5th toe. He denies fever or chills.   Diabetic: Yes, most recent A1C result on file was 7.6 on 01-24-18 Tobacco use: former smoker  (1/2 ppd x 35 yrs), quit about 08-20-18.   Pt meds include: Statin :Yes Betablocker: Yes ASA: Yes Other anticoagulants/antiplatelets: Eliquis, Plavix  Hospital Course:  The patient was admitted to the hospital and taken to the operating room on 09/01/2018 and underwent: Transmetatarsal amputation of toes 4 and 5 left foot, application of VAC dressing    Operative findings: Necrotic soft tissue and muscle extending into the third metatarsal  The pt tolerated the procedure  well and was transported to the PACU in good condition.   By POD 1, pt was doing well.  The vac was left in place.  On POD 2, the vac was removed and the wound was clean.  HH and wound vac was arranged and pt is discharged home with plans to f/u with Dr. Darrick PennaFields in 2 weeks for wound check.    He is instructed to walk with crutches and no weight bearing left foot.   The remainder of the hospital course consisted of increasing mobilization and increasing intake of solids without difficulty.  CBC    Component Value Date/Time   WBC 21.5 (H) 09/02/2018 0712   RBC 4.07 (L) 09/02/2018 0712   HGB 11.8 (L) 09/02/2018 0712   HCT 35.9 (L) 09/02/2018 0712   PLT 302 09/02/2018 0712   MCV 88.2 09/02/2018 0712   MCH 29.0 09/02/2018 0712   MCHC 32.9 09/02/2018 0712   RDW 13.9 09/02/2018 0712   LYMPHSABS 2.8 08/14/2018 1954   MONOABS 1.6 (H) 08/14/2018 1954   EOSABS 0.2 08/14/2018 1954   BASOSABS 0.1 08/14/2018 1954    BMET    Component Value Date/Time   NA 136 09/02/2018 0712   K 4.2 09/02/2018 0712   CL 103 09/02/2018 0712   CO2 23 09/02/2018 0712   GLUCOSE 184 (H) 09/02/2018 0712   BUN 16 09/02/2018 0712   CREATININE 1.16 09/02/2018 0712   CALCIUM 9.2 09/02/2018 0712   GFRNONAA >60 09/02/2018 0712   GFRAA >60 09/02/2018 [id number]      Discharge Instructions    Call MD for:  redness, tenderness, or signs of infection (pain, swelling, bleeding, redness, odor or  green/yellow discharge around incision site)   Complete by: As directed    Call MD for:  severe or increased pain, loss or decreased feeling  in affected limb(s)   Complete by: As directed    Call MD for:  temperature >100.5   Complete by: As directed    Discharge instructions   Complete by: As directed    NON WEIGHT BEARING LEFT FOOT.  USE CRUTCHES TO AMBULATE WITHOUT PUTTING WEIGHT ON LEFT FOOT.   Discharge patient   Complete by: As directed    Discharge home once pt has worked with PT, [place] and wound vac set up for home.   Thanks   Discharge disposition: 01-Home or Self Care   Discharge patient date: 09/03/2018   Discharge wound care:   Complete by: As directed    Wound vac change every M/W/F.   Driving Restrictions   Complete by: As directed    No driving until returning to see Dr. Oneida Alar while taking pain medication   Lifting restrictions   Complete by: As directed    No heavy lifting for 3 weeks   Resume previous diet   Complete by: As directed       Discharge Diagnosis:  peripheral vascular disease with gangrene left foot  Secondary Diagnosis: Patient Active Problem List   Diagnosis Date Noted  . Gangrene of left foot ([place]) 09/01/2018  . Acute deep vein thrombosis of lower leg ([place]) 08/15/2018  . Encounter for screening colonoscopy 08/07/2018  . Carotid arterial disease ([place]) 07/22/2018  . Hypokalemia   . Hypervolemia   . Chronic atrial fibrillation 01/24/2018  . Essential hypertension 01/24/2018  . Hyperlipidemia 01/24/2018  . Uncontrolled diabetes mellitus ([place]) 01/24/2018  . Tobacco abuse 01/24/2018  . Acute hypoxemic respiratory failure ([place])   . Acute pulmonary edema (H[facility])   . Atrial fibrillation with rapid ventricular response ([place])   . Limb ischemia 01/23/2018  . Pressure injury of skin 09/18/2017  . Ischemic leg 09/17/2017  . PAD (peripheral artery disease) ([place]) 05/09/2017  . Atherosclerosis of artery of extremity with rest pain ([place])   . Preoperative cardiovascular examination   . Leukocytosis 12/10/2016  . Coronary artery disease    Past Medical History:  Diagnosis Date  . Carotid artery disease (HCC)    a. 60-70% RICA, totally occluded LICA, > 73% stenosed bilat ECA 04/2017.  Marland Kitchen Chronic atrial fibrillation   . Coronary artery disease    a. remote cath in 2009 showing 30% mRCA, 30% PDA, 45% LAD, 30% diag.  . Diabetes mellitus   . Dyslipidemia   . History of kidney stones   . Hypertension   . PAD (peripheral artery disease) ([place])   . Tobacco abuse      Allergies  as of 09/03/2018      Reactions   Ciprofloxacin Rash      Medication List    STOP taking these medications   cephALEXin 500 MG capsule Commonly known as: KEFLEX     TAKE these medications   apixaban 5 MG Tabs tablet Commonly known as: Eliquis Take 1 tablet (5 mg total) by mouth 2 (two) times daily.   aspirin 81 MG EC tablet Take 1 tablet (81 mg total) by mouth daily.   atorvastatin 40 MG tablet Commonly known as: LIPITOR Take 1 tablet (40 mg total) by mouth daily at 6 PM.   benazepril 10 MG tablet Commonly known as: LOTENSIN Take 1 tablet (10 mg total) by mouth daily.   buPROPion 200 MG  12 hr tablet Commonly known as: WELLBUTRIN SR Take 200 mg by mouth every morning.   clopidogrel 75 MG tablet Commonly known as: Plavix Take 1 tablet (75 mg total) by mouth daily.   gabapentin 300 MG capsule Commonly known as: Neurontin Take 1 capsule (300 mg total) by mouth 3 (three) times daily. What changed: when to take this   glimepiride 4 MG tablet Commonly known as: Amaryl Take 1 tablet (4 mg total) by mouth daily with breakfast.   HYDROcodone-acetaminophen 5-325 MG tablet Commonly known as: NORCO/VICODIN Take 1 tablet by mouth every 6 (six) hours as needed for moderate pain.   levothyroxine 50 MCG tablet Commonly known as: SYNTHROID Take 50 mcg by mouth daily before breakfast.   metoprolol tartrate 50 MG tablet Commonly known as: LOPRESSOR Take 1 tablet (50 mg total) by mouth 2 (two) times daily.   sertraline 50 MG tablet Commonly known as: ZOLOFT Take 50 mg by mouth at bedtime.   sitaGLIPtin-metformin 50-1000 MG tablet Commonly known as: JANUMET Take 1 tablet by mouth 2 (two) times daily.            Durable Medical Equipment  (From admission, onward)         Start     Ordered   09/03/18 0826  For home use only DME Crutches  Once     09/03/18 0825           Discharge Care Instructions  (From admission, onward)         Start     Ordered    09/03/18 0000  Discharge wound care:    Comments: Wound vac change every M/W/F.   09/03/18 0831          Prescriptions given: None given  Instructions: 1.  No driving for 3 weeks and while taking pain medication 2.  No heavy lifting x weeks 3.  Wound vac change M/W/F. 4.  Non weight bearing left foot.  Walk with crutches.   Disposition: home  Patient's condition: is Good  Follow up: 1. Dr. Darrick PennaFields in 2 weeks   Doreatha MassedSamantha Delmi Fulfer, PA-C [facility] [phone] 09/03/2018  10:24 AM

## 2018-09-03 NOTE — Progress Notes (Signed)
Patient d/c's home with wet to dry dressing with pending wound vac delivery tomorrow 09/04/18.  Home health scheduled to apply wound vac 09/05/18/  Patient aware and case manager # given for follow-up tomorrow.   Sharyn Lull, RN

## 2018-09-03 NOTE — Progress Notes (Signed)
Inpatient Diabetes Program Recommendations  AACE/ADA: New Consensus Statement on Inpatient Glycemic Control (2015)  Target Ranges:  Prepandial:   less than 140 mg/dL      Peak postprandial:   less than 180 mg/dL (1-2 hours)      Critically ill patients:  140 - 180 mg/dL   Results for FREDDI, SCHRAGER (MRN [id number]) as of 09/03/2018 09:54  Ref. Range 09/02/2018 06:12 09/02/2018 11:10 09/02/2018 16:28 09/02/2018 21:29 09/02/2018 22:50  Glucose-Capillary Latest Ref Range: 70 - 99 mg/dL 168 (H)  3 units NOVOLOG +  4 mg Amaryl   169 (H)  3 units NOVOLOG  92 61 (L) 157 (H)   Results for OCEAN, SCHILDT (MRN [id number]) as of 09/03/2018 09:54  Ref. Range 09/03/2018 06:26  Glucose-Capillary Latest Ref Range: 70 - 99 mg/dL 63 (L)     Home DM Meds: Amaryl 4 mg Daily       Janumet 50/1000 mg 1 tablet BID  Current Orders: Novolog Moderate Correction Scale/ SSI (0-15 units) TID AC      Amaryl 4 mg Daily      Tradjenta 5 mg Daily      Metformin 1000 mg BID      MD- Note patient with Hypoglycemia yesterday at bedtime and again this AM.  Please consider discontinuing Amaryl for now.     --Will follow patient during hospitalization--  Wyn Quaker RN, MSN, CDE Diabetes Coordinator Inpatient Glycemic Control Team Team Pager: [phone] (8a-5p)

## 2018-09-03 NOTE — TOC Transition Note (Addendum)
Transition of Care [facility]) - CM/SW Discharge Note Marvetta Gibbons RN, BSN Transitions of Care Unit 4E- RN Case Manager [phone]   Patient Details  Name: Willie Morales MRN: [id number] Date of Birth: [birth year]  Transition of Care [facility]) CM/SW Contact:  Dawayne Patricia, RN Phone Number: 09/03/2018, 3:25 PM   Clinical Narrative:    Pt s/p TMA, and I&D, per MD note will need home wound VAC for transition home. [employer] wound VAC form has been signed and faxed along with support document to Ken Caryl with [employer] for home wound VAC approval. Once approved VAC to be delivered here to hospital for transition home. CM spoke with pt and pt aware this could be late this afternoon before [facility] approved and delivered. Orders placed for HHRN/PT- choice offered with list provided Per CMS guidelines from medicare.gov website with star ratings (copy placed in chart) per pt he has used [facility] in past and would like to use them again for [facility] needs- referral called to Butch Penny with [facility] - referral has been accepted. With [facility] for 7/17- crutches have been delivered to the room by ortho tech.  Pt reports that he will have transportation even if it is late today when [facility] arrives.  1700- update- spoke with [employer] VAC has been approved however due to late hour can not be delivered today- pt wants to go home- bedside RN has spoken with MD on call who has given verbal that pt can go home with w-d drsg and home VAC to be placed per [facility] nurse once delivered to home, [facility] aware that they will need to place home [place] will plan to deliver [facility] to home on7/16.  Update- post discharge- have f/u with [employer] this am- home VAC is released for delivery- and will be delivered to pt's home this afternoon. Have spoken with pt regarding delivery. Due to timing of delivery- HH will plan to make visit on 7/17 to do vac dressing  Final next level of care: [place] Barriers to Discharge: Equipment Delay   Patient Goals and CMS  Choice Patient states their goals for this hospitalization and ongoing recovery are:: "to have healing" CMS Medicare.gov Compare Post Acute Care list provided to:: Patient Choice offered to / list presented to : Patient  Discharge Placement  Home with [facility]                     Discharge Plan and Services   Discharge Planning Services: CM Consult Post Acute Care Choice: Durable Medical Equipment, Home Health          DME Arranged: Crutches DME Agency: [place] Date DME Agency Contacted: 09/03/18 Time DME Agency Contacted: (bedside RN contacted) Representative spoke with at DME Agency: ortho tech HH Arranged: RN, PT [place] Agency: [place] ([place]) Date [place]: 09/03/18 Time [place]: 1128 Representative spoke with at [place]: [place] ([place]) Interventions     Readmission Risk Interventions Readmission Risk Prevention Plan 09/03/2018  Transportation Screening Complete  PCP or Specialist Appt within 5-7 Days Complete  Home Care Screening Complete  Medication Review (RN CM) Complete  Some recent data might be hidden

## 2018-09-03 NOTE — Progress Notes (Signed)
Hypoglycemic Event  CBG: 61  Treatment: 25g Dextrose IV  Symptoms: None  Follow-up CBG: Time:22:50 CBG Result:157  Possible Reasons for Event: Poor intake  Comments/MD notified:No    Willie Morales

## 2018-09-03 NOTE — Progress Notes (Addendum)
  Progress Note    09/03/2018 8:13 AM 2 Days Post-Op  Subjective:  No complaints  Afebrile HR 45'X-64'W afib 80'H systolic 212% RA  Vitals:   09/02/18 0928 09/03/18 0458  BP: 92/72 (!) 95/57  Pulse: 81   Resp:  13  Temp:  97.9 F (36.6 C)  SpO2:  100%    Physical Exam: Incisions:      Extremities:  Brisk doppler signal left PT>DP; faint peroneal doppler signal  CBC    Component Value Date/Time   WBC 21.5 (H) 09/02/2018 0712   RBC 4.07 (L) 09/02/2018 0712   HGB 11.8 (L) 09/02/2018 0712   HCT 35.9 (L) 09/02/2018 0712   PLT 302 09/02/2018 0712   MCV 88.2 09/02/2018 0712   MCH 29.0 09/02/2018 0712   MCHC 32.9 09/02/2018 0712   RDW 13.9 09/02/2018 0712   LYMPHSABS 2.8 08/14/2018 1954   MONOABS 1.6 (H) 08/14/2018 1954   EOSABS 0.2 08/14/2018 1954   BASOSABS 0.1 08/14/2018 1954    BMET    Component Value Date/Time   NA 136 09/02/2018 0712   K 4.2 09/02/2018 0712   CL 103 09/02/2018 0712   CO2 23 09/02/2018 0712   GLUCOSE 184 (H) 09/02/2018 0712   BUN 16 09/02/2018 0712   CREATININE 1.16 09/02/2018 0712   CALCIUM 9.2 09/02/2018 0712   GFRNONAA >60 09/02/2018 0712   GFRAA >60 09/02/2018 0712    INR    Component Value Date/Time   INR 1.3 (H) 09/01/2018 0701     Intake/Output Summary (Last 24 hours) at 09/03/2018 0813 Last data filed at 09/03/2018 0700 Gross per 24 hour  Intake -  Output 200 ml  Net -200 ml     Assessment:  58 y.o. male is s/p:  Transmetatarsal amputation of toes 4 and 5 left foot, application of VAC dressing   2 Days Post-Op  Plan: -wound pretty clean this morning.  Dr. Oneida Alar to see pt to determine wound vac vs dressing changes vs surgery. - brisk doppler signal left PT>DP and faint peroneal doppler signal   Leontine Locket, PA-C [facility] [phone] 09/03/2018 8:13 AM  Agree with above Will d/c home with [facility] He is not weight bearing left foot so will need cruthches and PT eval Wound is 9 x 7  cm 2 cm depth with 100 percent granulation  Ruta Hinds, MD [facility] [place] Office: [phone] Pager: [phone]

## 2018-09-03 NOTE — Progress Notes (Addendum)
PT Cancellation Note  Patient Details Name: Willie Morales MRN: [id number] DOB: Nov 17, 1960   Cancelled Treatment:    Reason Eval/Treat Not Completed: Other (comment) Pt requesting to hold on PT until wound vac applied. Will follow up as schedule allows.   Checked on pt at 1756 and pt had not received wound vac. Pt and RN requesting to hold. Will follow up as schedule allows.   Leighton Ruff, PT, DPT  Acute Rehabilitation Services  Pager: [phone] Office: [phone]    Rudean Hitt 09/03/2018, 10:51 AM

## 2018-09-03 NOTE — Anesthesia Postprocedure Evaluation (Signed)
Anesthesia Post Note  Patient: Willie Morales  Procedure(s) Performed: Left transmetatarsal amputation of toes four and five, application of wound vac (Left Foot)     Patient location during evaluation: PACU Anesthesia Type: General Level of consciousness: awake and alert Pain management: pain level controlled Vital Signs Assessment: post-procedure vital signs reviewed and stable Respiratory status: spontaneous breathing, nonlabored ventilation, respiratory function stable and patient connected to nasal cannula oxygen Cardiovascular status: blood pressure returned to baseline and stable Postop Assessment: no apparent nausea or vomiting Anesthetic complications: no    Last Vitals:  Vitals:   09/03/18 0938 09/03/18 1701  BP: 115/84 (!) 162/94  Pulse: 74 82  Resp:  18  Temp:    SpO2:      Last Pain:  Vitals:   09/03/18 1500  TempSrc:   PainSc: 0-No pain                 Lang Zingg

## 2018-09-03 NOTE — Progress Notes (Signed)
Orthopedic Tech Progress Note Patient Details:  Willie Morales 12/07/1960 [id number] RN called requesting crutches for patient he's being discharged. Ortho Devices Type of Ortho Device: Crutches Ortho Device/Splint Interventions: Adjustment   Post Interventions Patient Tolerated: Other (comment) Instructions Provided: Care of device, Adjustment of device   Janit Pagan 09/03/2018, 11:09 AM

## 2018-09-04 LAB — AEROBIC/ANAEROBIC CULTURE W GRAM STAIN (SURGICAL/DEEP WOUND): Gram Stain: NONE SEEN

## 2018-09-12 ENCOUNTER — Telehealth: Payer: Self-pay | Admitting: *Deleted

## 2018-09-12 NOTE — Telephone Encounter (Signed)
Received at call for Willie Lawrence, NP  Regarding patient wound culture results from 09-01-18 done while at [facility]. Pt was told to D/C Keflex at discharge because culture was not complete. Culture has grown out CITROBACTER KOSERI: and Loma Sousa is going to RX Bactrim to this patient. She just wanted [place] to be aware of this culture report.    11d ago   Specimen Description TISSUE AMPUTATION LEFT 5TH TOE AND DEBIDEMENT OF NECROTIC TISSUE   Special Requests NONE   Gram Stain NO WBC SEEN  ABUNDANT GRAM NEGATIVE RODS  ABUNDANT GRAM POSITIVE COCCI IN PAIRS IN CLUSTERS   Culture MODERATE CITROBACTER KOSERI  ABUNDANT BACTEROIDES CACCAE  MODERATE BACTEROIDES FRAGILIS  BETA LACTAMASE POSITIVE  Performed at [facility], [place] [address], [place], [place] [address]   Report Status 09/04/2018 FINAL   Organism ID, Bacteria CITROBACTER KOSERI   Resulting Agency [facility]  Susceptibility   Citrobacter koseri    MIC    CEFAZOLIN <=4 SENSITIVE  Sensitive    CEFEPIME <=1 SENSITIVE  Sensitive    CEFTAZIDIME <=1 SENSITIVE  Sensitive    CEFTRIAXONE <=1 SENSITIVE  Sensitive    CIPROFLOXACIN <=0.25 SENS... Sensitive    GENTAMICIN <=1 SENSITIVE  Sensitive    IMIPENEM <=0.25 SENS... Sensitive    PIP/TAZO <=4 SENSITIVE  Sensitive    TRIMETH/SULFA <=20 SENSIT... Sensitive

## 2018-09-17 ENCOUNTER — Encounter: Payer: Self-pay | Admitting: Family

## 2018-09-17 ENCOUNTER — Ambulatory Visit (INDEPENDENT_AMBULATORY_CARE_PROVIDER_SITE_OTHER): Payer: Self-pay | Admitting: Family

## 2018-09-17 ENCOUNTER — Other Ambulatory Visit: Payer: Self-pay

## 2018-09-17 VITALS — BP 108/76 | HR 83 | Temp 97.1°F | Resp 18 | Ht 72.0 in | Wt 174.0 lb

## 2018-09-17 DIAGNOSIS — I6521 Occlusion and stenosis of right carotid artery: Secondary | ICD-10-CM

## 2018-09-17 DIAGNOSIS — Z89422 Acquired absence of other left toe(s): Secondary | ICD-10-CM

## 2018-09-17 DIAGNOSIS — I779 Disorder of arteries and arterioles, unspecified: Secondary | ICD-10-CM

## 2018-09-17 DIAGNOSIS — Z95828 Presence of other vascular implants and grafts: Secondary | ICD-10-CM

## 2018-09-17 DIAGNOSIS — E1151 Type 2 diabetes mellitus with diabetic peripheral angiopathy without gangrene: Secondary | ICD-10-CM

## 2018-09-17 DIAGNOSIS — F17200 Nicotine dependence, unspecified, uncomplicated: Secondary | ICD-10-CM

## 2018-09-17 DIAGNOSIS — Z8673 Personal history of transient ischemic attack (TIA), and cerebral infarction without residual deficits: Secondary | ICD-10-CM

## 2018-09-17 DIAGNOSIS — I6522 Occlusion and stenosis of left carotid artery: Secondary | ICD-10-CM

## 2018-09-17 NOTE — Patient Instructions (Signed)

## 2018-09-17 NOTE — Progress Notes (Signed)
[facility] [place]   CC: Follow up s/p left transmetatarsal amputation of toes 4 and 5 left foot, application of VAC dressing on 09-01-18   History of Present Illness KUSH FARABEE is a 58 y.o. male who is s/p left transmetatarsal amputation of toes 4 and 5 left foot, application of VAC dressing on 09-01-18 by Dr. Oneida Alar for Gangrene left foot.  He returns today for pot op wound check.   He states his PCP prescribed an antibiotic for him, he does not know what it is, states he has not picked it up from his pharmacy. It is not on his medication list.  He denies fever or chills, denies pain in his left foot.   He states HH is changing his left foot dressing 3x/week with wound vac.    He is also s/p s/p mechanical thrombectomy of the left femoral-popliteal bypass graft, tibioperoneal trunk, and posterior tibial artery, balloon angioplasty of the right posterior tibial, tibioperoneal trunk, popliteal, and femoral-popliteal bypass graft, and intra-arterial administration of nitroglycerin on 08-15-18 by Dr. Trula Slade.  The patient has previously undergone a left  femoral-popliteal bypass graft with Gore-Tex. This occluded about 6 months prior. Blood flow was reestablished and he had stenting of his posterior tibial and peroneal arteries. He came the night prior with an ischemic leg and a lytics catheter was placed. He received TPA overnight. Dr. Scot Dock brought him back that day for a follow-up study. Imaging revealed residual thrombus within the bypass graft and Dr. Trula Slade was asked to assist with intervention.  Diabetic: Yes, most recent A1C result on file was 7.9 on 09-02-18 Tobacco use: current smoker  (1/3 ppd x 35 yrs)  Pt meds include: Statin :Yes Betablocker: Yes ASA: Yes Other anticoagulants/antiplatelets: Eliquis, Plavix   Past Medical History:  Diagnosis Date  . Carotid artery disease (HCC)    a. 60-70% RICA, totally occluded LICA, > 40% stenosed bilat  ECA 04/2017.  Marland Kitchen Chronic atrial fibrillation   . Coronary artery disease    a. remote cath in 2009 showing 30% mRCA, 30% PDA, 45% LAD, 30% diag.  . Diabetes mellitus   . Dyslipidemia   . History of kidney stones   . Hypertension   . PAD (peripheral artery disease) ([place])   . Tobacco abuse     Social History Social History   Tobacco Use  . Smoking status: Current Every Day Smoker    Packs/day: 0.50    Years: 35.00    Pack years: 17.50    Types: Cigarettes  . Smokeless tobacco: Never Used  . Tobacco comment: 2-3 cigarettes per day  Substance Use Topics  . Alcohol use: No  . Drug use: No    Family History Family History  Problem Relation Age of Onset  . Coronary artery disease Father        CABG in early 59's  . Heart attack Father   . Heart disease Father   . Cancer Mother   . Heart disease Mother   . Heart disease Brother   . Diabetes Maternal Grandmother   . SIDS Sister   . Colon cancer Neg Hx     Past Surgical History:  Procedure Laterality Date  . ABDOMINAL AORTOGRAM W/LOWER EXTREMITY N/A 07/05/2016   Procedure: Abdominal Aortogram w/Lower Extremity;  Surgeon: Conrad , MD;  Location: [place] [facility];  Service: Cardiovascular;  Laterality: N/A;  . ABDOMINAL AORTOGRAM W/LOWER EXTREMITY N/A 05/06/2017   Procedure: ABDOMINAL AORTOGRAM W/LOWER EXTREMITY;  Surgeon:  Maeola Harmanain, Brandon Christopher, MD;  [facility];  Service: Cardiovascular;  Laterality: N/A;  . ABDOMINAL AORTOGRAM W/LOWER EXTREMITY N/A 08/15/2018   Procedure: ABDOMINAL AORTOGRAM W/LOWER EXTREMITY;  Surgeon: Nada LibmanBrabham, Vance W, MD;  [facility]: [facility];  Service: Cardiovascular;  Laterality: N/A;  . AMPUTATION Left 09/01/2018   Procedure: Left transmetatarsal amputation of toes four and five, application of wound vac;  Surgeon: Sherren KernsFields, Charles E, MD;  [facility];  Service: Vascular;  Laterality: Left;  . APPLICATION OF WOUND VAC Left 01/29/2018   Procedure: APPLICATION OF  WOUND VAC LEFT LOWER LEG MEDIAL AND LATERAL SIDES;  Surgeon: Nada LibmanBrabham, Vance W, MD;  [facility]: [facility];  Service: Vascular;  Laterality: Left;  . BACK SURGERY  1991  . CARDIAC CATHETERIZATION  07/31/2007   This showed nonobstructive coronary artery disease   He had 30% lesion in the mid RCA, 30% lesion in the PDA, 45% lesion in the LAD and mid  LAD, and 30% lesion in the diagonal.  The left circumflex was normal.  The LV function was 60%.     Marland Kitchen. ENDARTERECTOMY FEMORAL Left 05/09/2017   Procedure: ENDARTERECTOMY FEMORAL WITH DACRON PATCH ANGIOPLASTY;  Surgeon: Fransisco Hertzhen, Brian L, MD;  [facility];  Service: Vascular;  Laterality: Left;  . ENDARTERECTOMY POPLITEAL  09/17/2017   Procedure: ENDARTERECTOMY POPLITEAL ARTERY AND TIBIAL FIBULAR TRUNK;  Surgeon: Chuck Hintickson, Christopher S, MD;  [facility];  Service: Vascular;;  . FASCIOTOMY CLOSURE Left 01/24/2018   Procedure: FASCIOTOMY OF LOWER LEFT LEG;  Surgeon: Nada LibmanBrabham, Vance W, MD;  [facility];  Service: Vascular;  Laterality: Left;  . FASCIOTOMY CLOSURE Left 01/29/2018   Procedure: FASCIOTOMY CLOSURE LEFT LOWER EXTREMITY;  Surgeon: Nada LibmanBrabham, Vance W, MD;  [facility];  Service: Vascular;  Laterality: Left;  . FEMORAL-POPLITEAL BYPASS GRAFT Left 05/09/2017   Procedure: BYPASS GRAFT FEMORAL-POPLITEAL ARTERY LEFT;  Surgeon: Fransisco Hertzhen, Brian L, MD;  [facility];  Service: Vascular;  Laterality: Left;  . FEMORAL-POPLITEAL BYPASS GRAFT Left 09/17/2017   Procedure: THROMBECTOMY LEFT FEMORAL-POPLITEAL BYPASS GRAFT;  Surgeon: Chuck Hintickson, Christopher S, MD;  [facility];  Service: Vascular;  Laterality: Left;  . KIDNEY STONE SURGERY  2011  . LOWER EXTREMITY ANGIOGRAM Left 01/23/2018   Procedure: LEFT LOWER EXTREMITY ANGIOGRAM, THROMBOLYSIS Catheter Placement;  Surgeon: Cephus Shellinglark, Christopher J, MD;  [facility];  Service: Vascular;  Laterality: Left;  . LOWER EXTREMITY ANGIOGRAM Left 08/14/2018   Procedure: Left LOWER EXTREMITY ANGIOGRAM, Lysis Catheter  Placement;  Surgeon: Maeola Harmanain, Brandon Christopher, MD;  [facility];  Service: Vascular;  Laterality: Left;  . LOWER EXTREMITY ANGIOGRAPHY N/A 12/06/2016   Procedure: Lower Extremity Angiography;  Surgeon: Fransisco Hertzhen, Brian L, MD;  [facility];  Service: Cardiovascular;  Laterality: N/A;  . LOWER EXTREMITY ANGIOGRAPHY N/A 09/05/2017   Procedure: LOWER EXTREMITY ANGIOGRAPHY;  Surgeon: Fransisco Hertzhen, Brian L, MD;  [facility];  Service: Cardiovascular;  Laterality: N/A;  . PATCH ANGIOPLASTY Left 05/09/2017   Procedure: PATCH ANGIOPLASTY WITH PROPATEN GRAFT  OF BELOW KNEE POPLITEAL ARTERY;  Surgeon: Fransisco Hertzhen, Brian L, MD;  [facility];  Service: Vascular;  Laterality: Left;  . PATCH ANGIOPLASTY Left 09/17/2017   Procedure: PATCH ANGIOPLASTY WITH PERICARDIAL BOVINE PATCH OF DISTAL ANASTOMOSIS;  Surgeon: Chuck Hintickson, Christopher S, MD;  [facility];  Service: Vascular;  Laterality: Left;  . PERCUTANEOUS VENOUS THROMBECTOMY,LYSIS WITH INTRAVASCULAR ULTRASOUND (IVUS) Left 01/24/2018   Procedure: Lysis Re-check;  Surgeon: Nada LibmanBrabham, Vance W, MD;  [facility];  Service: Vascular;  Laterality: Left;  . PERIPHERAL VASCULAR ATHERECTOMY Right 07/19/2016   Procedure: Peripheral Vascular Atherectomy;  Surgeon: Fransisco Hertz, MD;  [facility] LAB;  Service: Cardiovascular;  Laterality: Right;  . PERIPHERAL VASCULAR BALLOON ANGIOPLASTY Left 07/05/2016   Procedure: Peripheral Vascular Balloon Angioplasty;  Surgeon: Fransisco Hertz, MD;  [facility] INV[facility] CV [facility];  Service: Cardiovascular;  Laterality: Left;  SFA  . PERIPHERAL VASCULAR BALLOON ANGIOPLASTY Left 09/05/2017   Procedure: PERIPHERAL VASCULAR BALLOON ANGIOPLASTY;  Surgeon: Fransisco Hertz, MD;  Location[facility] LAB;  Service: Cardiovascular;  Laterality: Left;  Popliteal, profunda femoral, external iliac  . PERIPHERAL VASCULAR BALLOON ANGIOPLASTY Left 08/15/2018   Procedure: PERIPHERAL VASCULAR BALLOON ANGIOPLASTY;   Surgeon: Nada Libman, MD;  [facility];  Service: Cardiovascular;  Laterality: Left;  fem-pop bypass, TP trunk, PT  . PERIPHERAL VASCULAR INTERVENTION  12/06/2016   Procedure: PERIPHERAL VASCULAR INTERVENTION;  Surgeon: Fransisco Hertz, MD;  Location: [facility] INVASIVE CV LAB;  Service: Cardiovascular;;  LEFT    Allergies  Allergen Reactions  . Ciprofloxacin Rash    Current Outpatient Medications  Medication Sig Dispense Refill  . apixaban (ELIQUIS) 5 MG TABS tablet Take 1 tablet (5 mg total) by mouth 2 (two) times daily. 60 tablet 6  . aspirin EC 81 MG EC tablet Take 1 tablet (81 mg total) by mouth daily.    Marland Kitchen atorvastatin (LIPITOR) 40 MG tablet Take 1 tablet (40 mg total) by mouth daily at 6 PM. 60 tablet 6  . benazepril (LOTENSIN) 10 MG tablet Take 1 tablet (10 mg total) by mouth daily. 30 tablet 6  . buPROPion (WELLBUTRIN SR) 200 MG 12 hr tablet Take 200 mg by mouth every morning.  4  . clopidogrel (PLAVIX) 75 MG tablet Take 1 tablet (75 mg total) by mouth daily. 90 tablet 3  . gabapentin (NEURONTIN) 300 MG capsule Take 1 capsule (300 mg total) by mouth 3 (three) times daily. (Patient taking differently: Take 300 mg by mouth 2 (two) times daily. ) 90 capsule 11  . glimepiride (AMARYL) 4 MG tablet Take 1 tablet (4 mg total) by mouth daily with breakfast. 30 tablet 6  . HYDROcodone-acetaminophen (NORCO/VICODIN) 5-325 MG tablet Take 1 tablet by mouth every 6 (six) hours as needed for moderate pain. 20 tablet 0  . levothyroxine (SYNTHROID, LEVOTHROID) 50 MCG tablet Take 50 mcg by mouth daily before breakfast.     . metoprolol tartrate (LOPRESSOR) 50 MG tablet Take 1 tablet (50 mg total) by mouth 2 (two) times daily. 60 tablet 6  . sertraline (ZOLOFT) 50 MG tablet Take 50 mg by mouth at bedtime.  0  . sitaGLIPtin-metformin (JANUMET) 50-1000 MG tablet Take 1 tablet by mouth 2 (two) times daily.     No current facility-administered medications for this visit.     ROS: See HPI  for pertinent positives and negatives.   Physical Examination  Vitals:   09/17/18 0958  BP: 108/76  Pulse: 83  Resp: 18  Temp: (!) 97.1 F (36.2 C)  TempSrc: Temporal  SpO2: 97%  Weight: 174 lb (78.9 kg)  Height: 6' (1.829 m)   Body mass index is 23.6 kg/m.  General: A&O x 3, male in NAD Gait: USING  HENT: No gross abnormalities.  Eyes: Pupils equal Pulmonary: Respirations are non labored Cardiac: regular rhythm    Radial pulses are not palpable bilaterally, bilateral brachial pulses are 1+ palpable  Adominal  aortic pulse is not palpable                         VASCULAR EXAM: Extremities without ischemic changes, without Gangrene; with open wounds at left 4th and 5th toe amputation site and surrounding tissue debridement. See photos below   Left foot  Left foot                                                                                                            LE Pulses Right Left       FEMORAL  not palpable  2+ palpable        POPLITEAL  not palpable   not palpable       POSTERIOR TIBIAL  not palpable   1+ palpable        DORSALIS PEDIS      ANTERIOR TIBIAL not palpable  faintly palpable    Abdomen: soft, NT, no palpable masses. Skin: no rashes, no cellulitis, See Extremities  Musculoskeletal: no muscle wasting or atrophy. Neurologic: A&O X 3; appropriate affect, Sensation is normal; MOTOR FUNCTION:  moving all extremities equally, motor strength 5/5 throughout. Speech is fluent/normal. CN 2-12 intact. Psychiatric: Thought content is normal, mood appropriate for clinical situation.    ASSESSMENT: Lillia CarmelGeorge A Peyton is a 58 y.o. male who is s/p left transmetatarsal amputation of toes 4 and 5 left foot, application of VAC dressing on 09-01-18 by Dr. Darrick PennaFields for Gangrene left foot.    Left foot wound bed tissue is healthy appearing, wound debridement site is deep and extensive. His PCP prescribed unknown antibiotic (not on his medication list), which he  states he will pick up from his pharmacy today. He is ambulatory with crutches and non weight bearing left foot.  Left PT pulse is palpable.   Unfortunately he has resumed smoking which impedes wound healing.  See Plan.   PLAN:  Based on the patient's HPI and examination, pt will return to clinic in 3 weeks for wound check left foot. Continue wound vac dressing changes 3x/week.  Continue non weight bearing left foot.  Prophylactic antibiotic per pt PCP already prescribed, start taking ASAP.   I advised him to notify us if he develops fever of chills, or if it seems his left wound wound is becoming infected.   The patient was counseled re smoking cessation, and given some written information re smoking cessation.  Carotid Duplex about December 2020.    I discussed in depth with the patient the nature of atherosclerosis, and emphasized the importance of maximal medical management including strict control of blood pressure, blood glucose, and lipid levels, obtaining regular exercise, and cessation of smoking.  The patient is aware that without maximal medical management the underlying atherosclerotic disease process will progress, limiting the benefit of any interventions.  The patient was given information about PAD including signs, symptoms, treatment, what symptoms should prompt the patient to seek immediate medical care, and risk reduction measures to take.  Charisse MarchSuzanne Talma Aguillard, RN, MSN, FNP-C [facility] [place] Office Phone: [phone]  Clinic MD: Veda CanningDickson, Fields  09/17/18 10:05 AM

## 2018-10-07 ENCOUNTER — Ambulatory Visit: Payer: BC Managed Care – PPO

## 2018-10-08 ENCOUNTER — Ambulatory Visit (INDEPENDENT_AMBULATORY_CARE_PROVIDER_SITE_OTHER): Payer: Self-pay | Admitting: Physician Assistant

## 2018-10-08 ENCOUNTER — Other Ambulatory Visit: Payer: Self-pay

## 2018-10-08 VITALS — BP 139/82 | HR 72 | Temp 98.2°F | Resp 18 | Ht 73.0 in | Wt 175.0 lb

## 2018-10-08 DIAGNOSIS — I779 Disorder of arteries and arterioles, unspecified: Secondary | ICD-10-CM

## 2018-10-08 NOTE — Progress Notes (Signed)
POST OPERATIVE OFFICE NOTE    CC:  F/u for surgery  HPI:  Willie Morales is a 58 y.o. male who is s/p left transmetatarsal amputation of toes 4 and 5 left foot, application of VAC dressing on 09-01-18 by Dr. Oneida Alar for Gangrene left foot.  He is also s/p s/p mechanical thrombectomy of the left femoral-popliteal bypass graft, tibioperoneal trunk, and posterior tibial artery, balloon angioplasty of the right posterior tibial, tibioperoneal trunk, popliteal, and femoral-popliteal bypass graft, and intra-arterial administration of nitroglycerinon 08-15-18 by Dr. Trula Slade.   The patient has previously undergone a left  femoral-popliteal bypass graft with Gore-Tex. This occluded about 6 monthsprior. Blood flow was reestablished and he had stenting of his posterior tibial and peroneal arteries. He camethe night priorwith an ischemic leg and a lytics catheter was placed. He received TPA overnight. Dr. Scot Dock brought him back thatday for a follow-up study. Imaging revealed residual thrombus within the bypass graft andDr. Tarri Fuller asked to assist with intervention. Pt meds include: Statin :Yes Betablocker:Yes ASA:Yes Other anticoagulants/antiplatelets:Eliquis, Plavix   He returns today for pot op wound check.     Allergies  Allergen Reactions  . Ciprofloxacin Rash    Current Outpatient Medications  Medication Sig Dispense Refill  . apixaban (ELIQUIS) 5 MG TABS tablet Take 1 tablet (5 mg total) by mouth 2 (two) times daily. 60 tablet 6  . aspirin EC 81 MG EC tablet Take 1 tablet (81 mg total) by mouth daily.    Marland Kitchen atorvastatin (LIPITOR) 40 MG tablet Take 1 tablet (40 mg total) by mouth daily at 6 PM. 60 tablet 6  . benazepril (LOTENSIN) 10 MG tablet Take 1 tablet (10 mg total) by mouth daily. 30 tablet 6  . buPROPion (WELLBUTRIN SR) 200 MG 12 hr tablet Take 200 mg by mouth every morning.  4  . clopidogrel (PLAVIX) 75 MG tablet Take 1 tablet (75 mg total) by mouth daily. 90 tablet  3  . gabapentin (NEURONTIN) 300 MG capsule Take 1 capsule (300 mg total) by mouth 3 (three) times daily. (Patient taking differently: Take 300 mg by mouth 2 (two) times daily. ) 90 capsule 11  . glimepiride (AMARYL) 4 MG tablet Take 1 tablet (4 mg total) by mouth daily with breakfast. 30 tablet 6  . HYDROcodone-acetaminophen (NORCO/VICODIN) 5-325 MG tablet Take 1 tablet by mouth every 6 (six) hours as needed for moderate pain. 20 tablet 0  . levothyroxine (SYNTHROID, LEVOTHROID) 50 MCG tablet Take 50 mcg by mouth daily before breakfast.     . metoprolol tartrate (LOPRESSOR) 50 MG tablet Take 1 tablet (50 mg total) by mouth 2 (two) times daily. 60 tablet 6  . sertraline (ZOLOFT) 50 MG tablet Take 50 mg by mouth at bedtime.  0  . sitaGLIPtin-metformin (JANUMET) 50-1000 MG tablet Take 1 tablet by mouth 2 (two) times daily.     No current facility-administered medications for this visit.      ROS:  See HPI  Physical Exam:  Extremities without ischemic changes, without Gangrene; with open wounds at left 4th and 5th toe amputation site.    4cm x 3cm  Incision:  Healed left groin.  Left foot amputation site healing well with beefy red base.  1 cm of tendon exposed. Extremities:  Doppler DP/PT intact   Assessment/Plan:  This is a 58 y.o. male who is s/p: Transmetatarsal amputation of toes 4 and 5 left foot, application of VAC dressing  The wound is healing well measuring 3 cm x 4  cm with 1 cm depth.  The base is beefy red.  He has patent blood flow with DP/PT doppler signals.   He will return for wound check in 3-4 weeks.  Continue wound vac.    He is still working on smoking cessation.     Mosetta PigeonEmma Maureen [name] PA-C [facility] [phone]  Clinic MD:  Darrick PennaFields

## 2018-10-28 ENCOUNTER — Telehealth: Payer: Self-pay | Admitting: *Deleted

## 2018-10-28 NOTE — Telephone Encounter (Signed)
Noted  

## 2018-10-28 NOTE — Telephone Encounter (Signed)
Willie Morales from endo called. She spoke with his sister and stated he is currently in the hospital (not [facility]) and he is about to expire. Procedure cancelled. FYI to [facility]

## 2018-10-30 ENCOUNTER — Encounter (HOSPITAL_COMMUNITY): Admission: RE | Admit: 2018-10-30 | Payer: BC Managed Care – PPO | Source: Ambulatory Visit

## 2018-11-03 ENCOUNTER — Encounter (HOSPITAL_COMMUNITY): Payer: Self-pay

## 2018-11-03 ENCOUNTER — Ambulatory Visit (HOSPITAL_COMMUNITY): Admit: 2018-11-03 | Payer: BC Managed Care – PPO | Admitting: Internal Medicine

## 2018-11-03 SURGERY — COLONOSCOPY WITH PROPOFOL
Anesthesia: Monitor Anesthesia Care

## 2018-11-05 ENCOUNTER — Ambulatory Visit: Payer: BC Managed Care – PPO | Admitting: Family

## 2018-11-20 DEATH — deceased

## 2020-06-07 IMAGING — DX DG CHEST 1V PORT
2 series · 2 of 2 positions shown · non-contrast
Comparison: 03/05/2016

CLINICAL DATA: 57-year-old male with history of cough

EXAM:
PORTABLE CHEST 1 VIEW

[chest ap (1 of 2)]
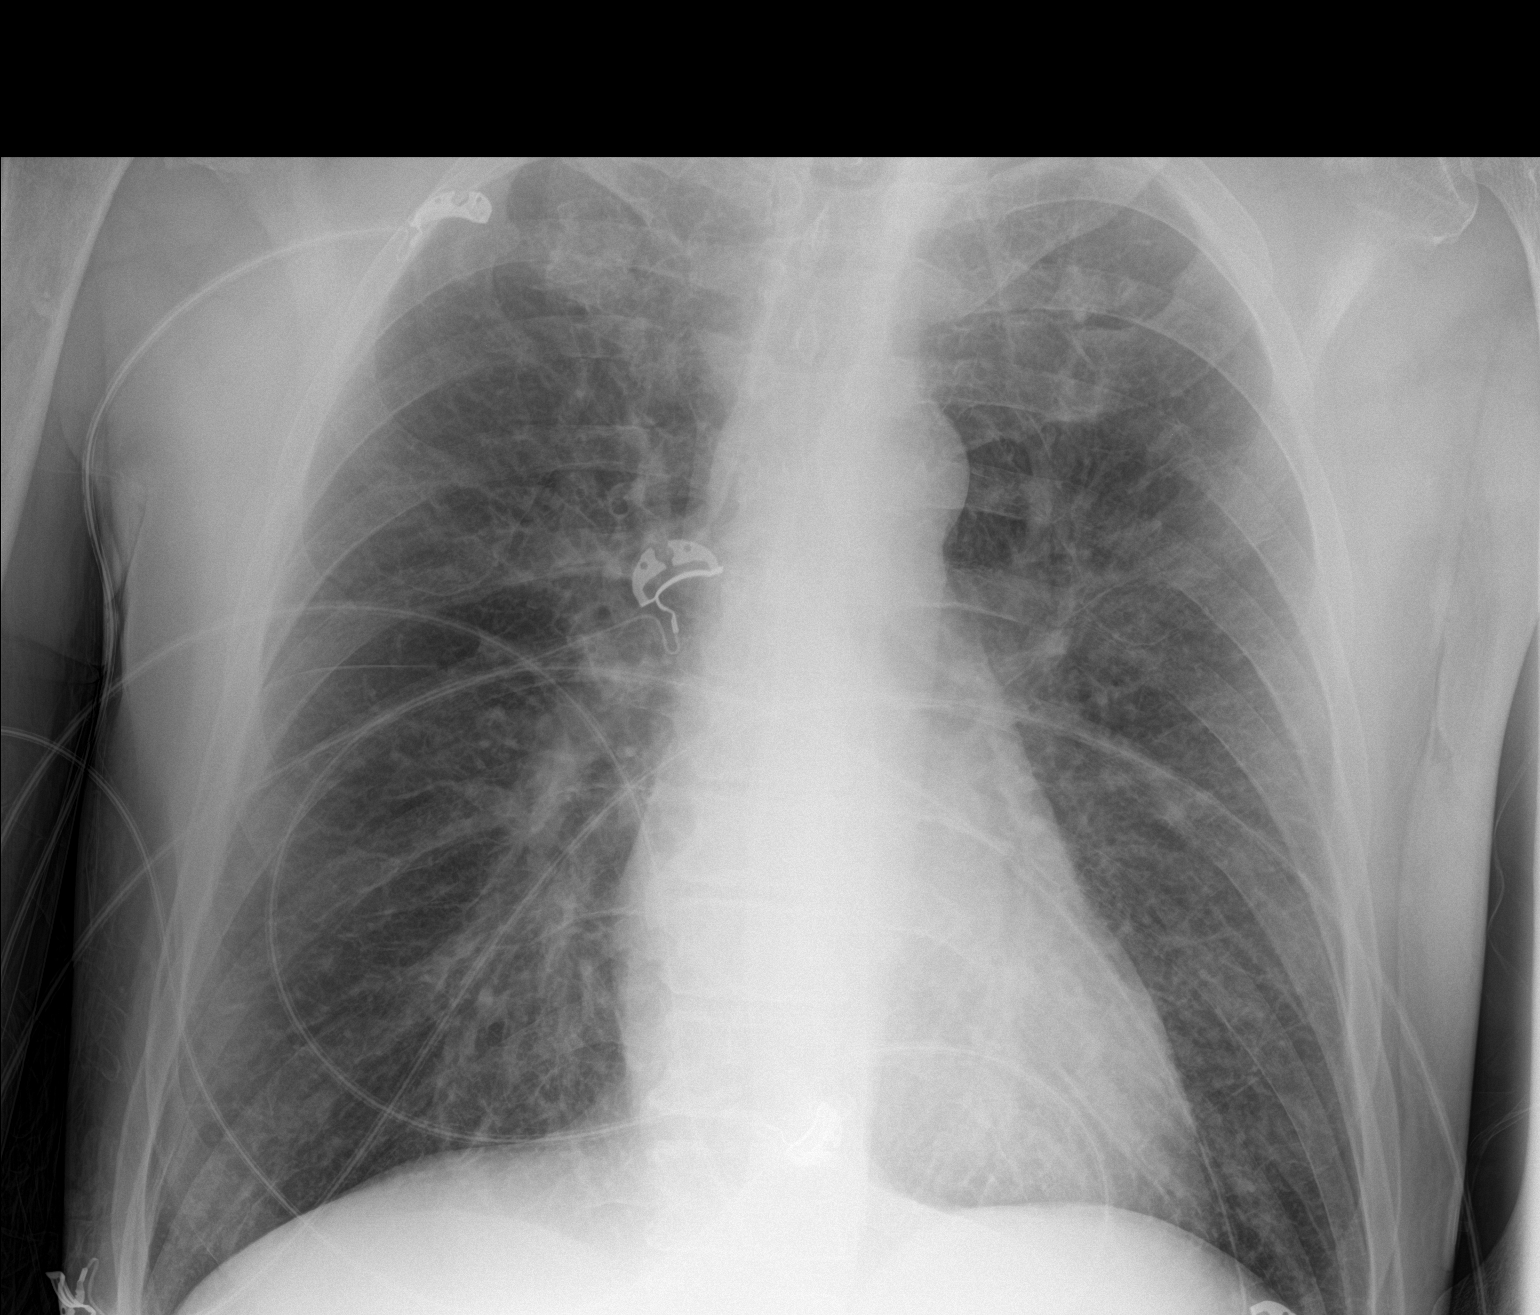

[chest ap (2 of 2)]
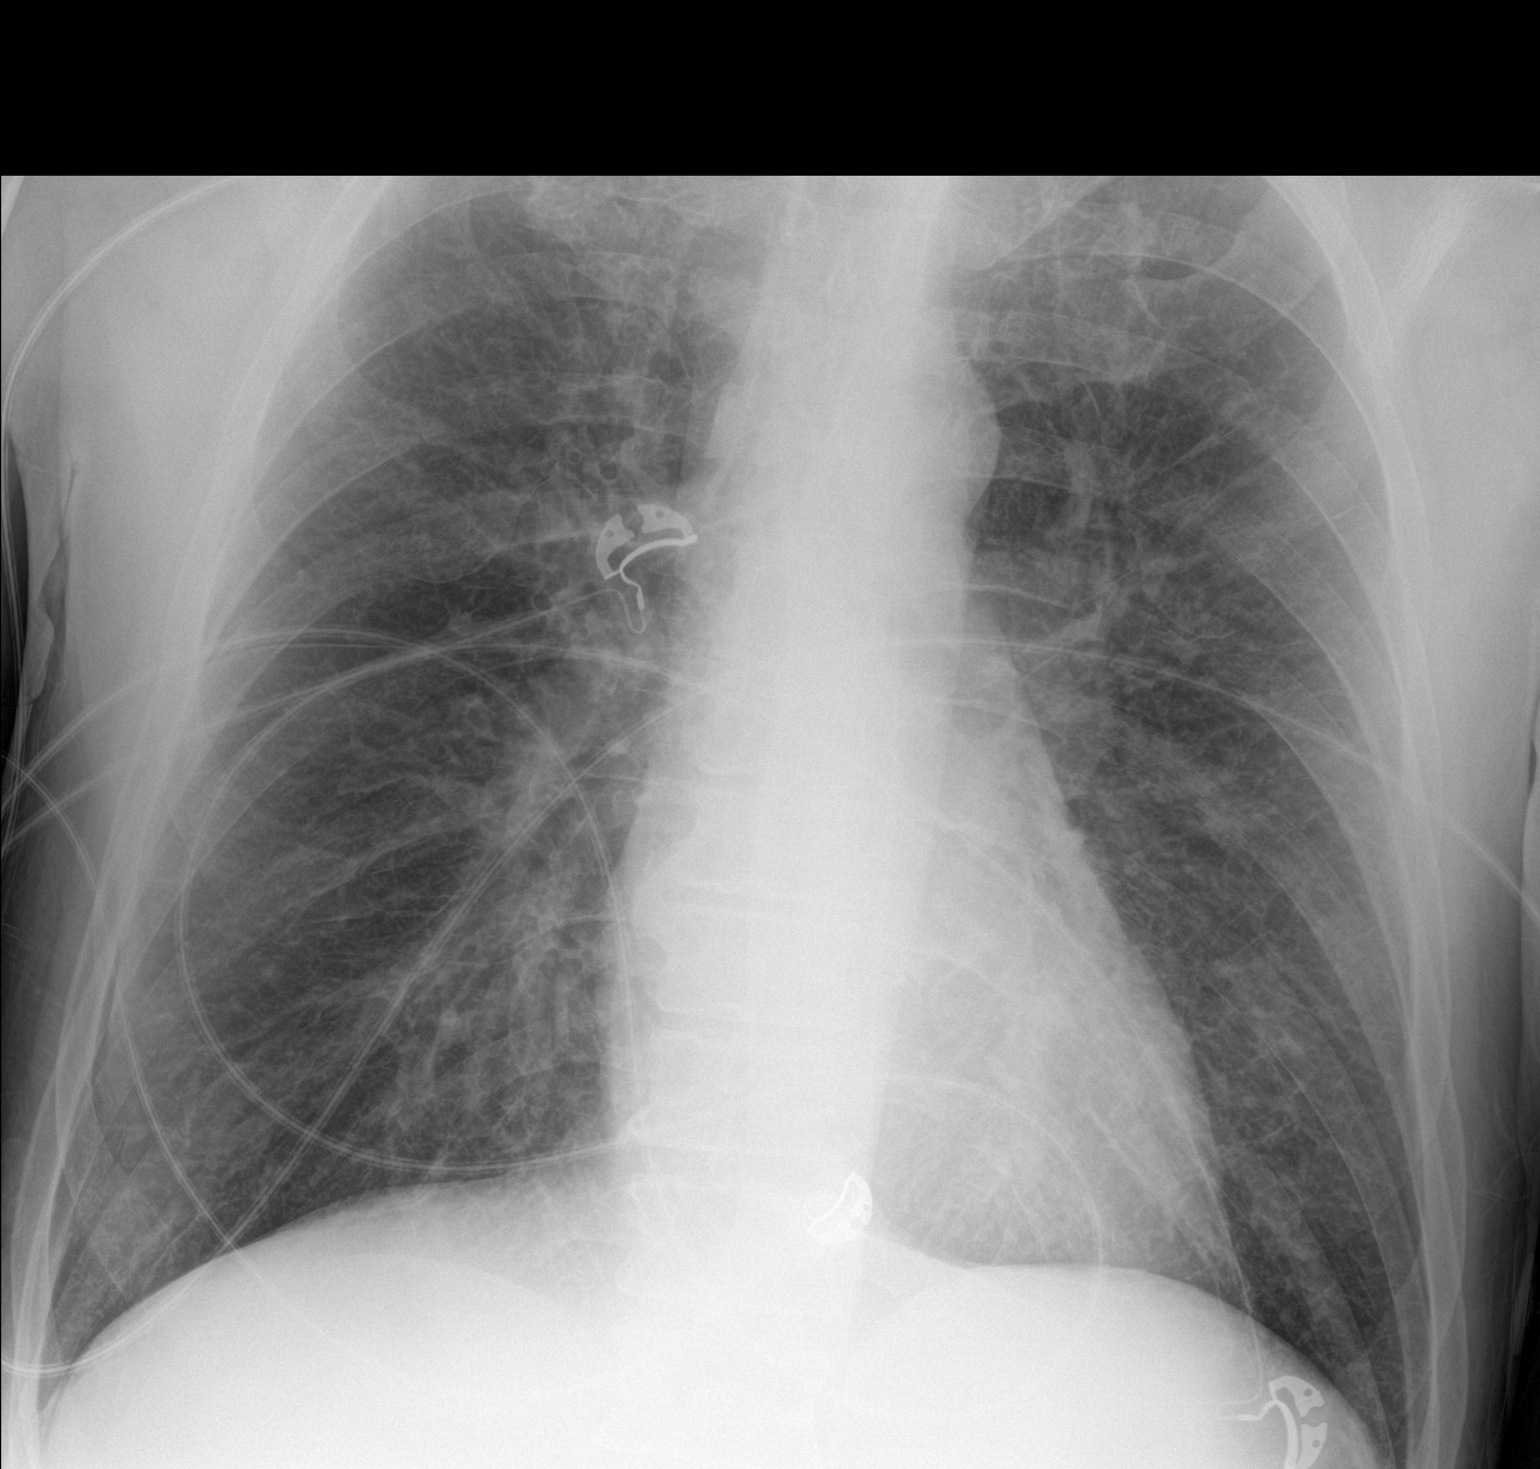

[2 of 2 positions shown; findings below may reference images not displayed]

FINDINGS: Cardiomediastinal silhouette unchanged in size and contour. No
evidence of central vascular congestion. No pneumothorax. No
confluent airspace disease.

Similar appearance of diffusely coarsened interstitial markings.
Evidence of emphysema with hyperinflation and flattened
hemidiaphragms.
IMPRESSION: Coarsened interstitial markings, appear chronic, with no evidence of
lobar pneumonia.

Emphysema

## 2020-06-07 IMAGING — DX DG ABDOMEN 1V
1 series · 1 of 1 positions shown · non-contrast
Comparison: None.

CLINICAL DATA: ET and OG tube placement

EXAM:
ABDOMEN - 1 VIEW

[abdomen kub]
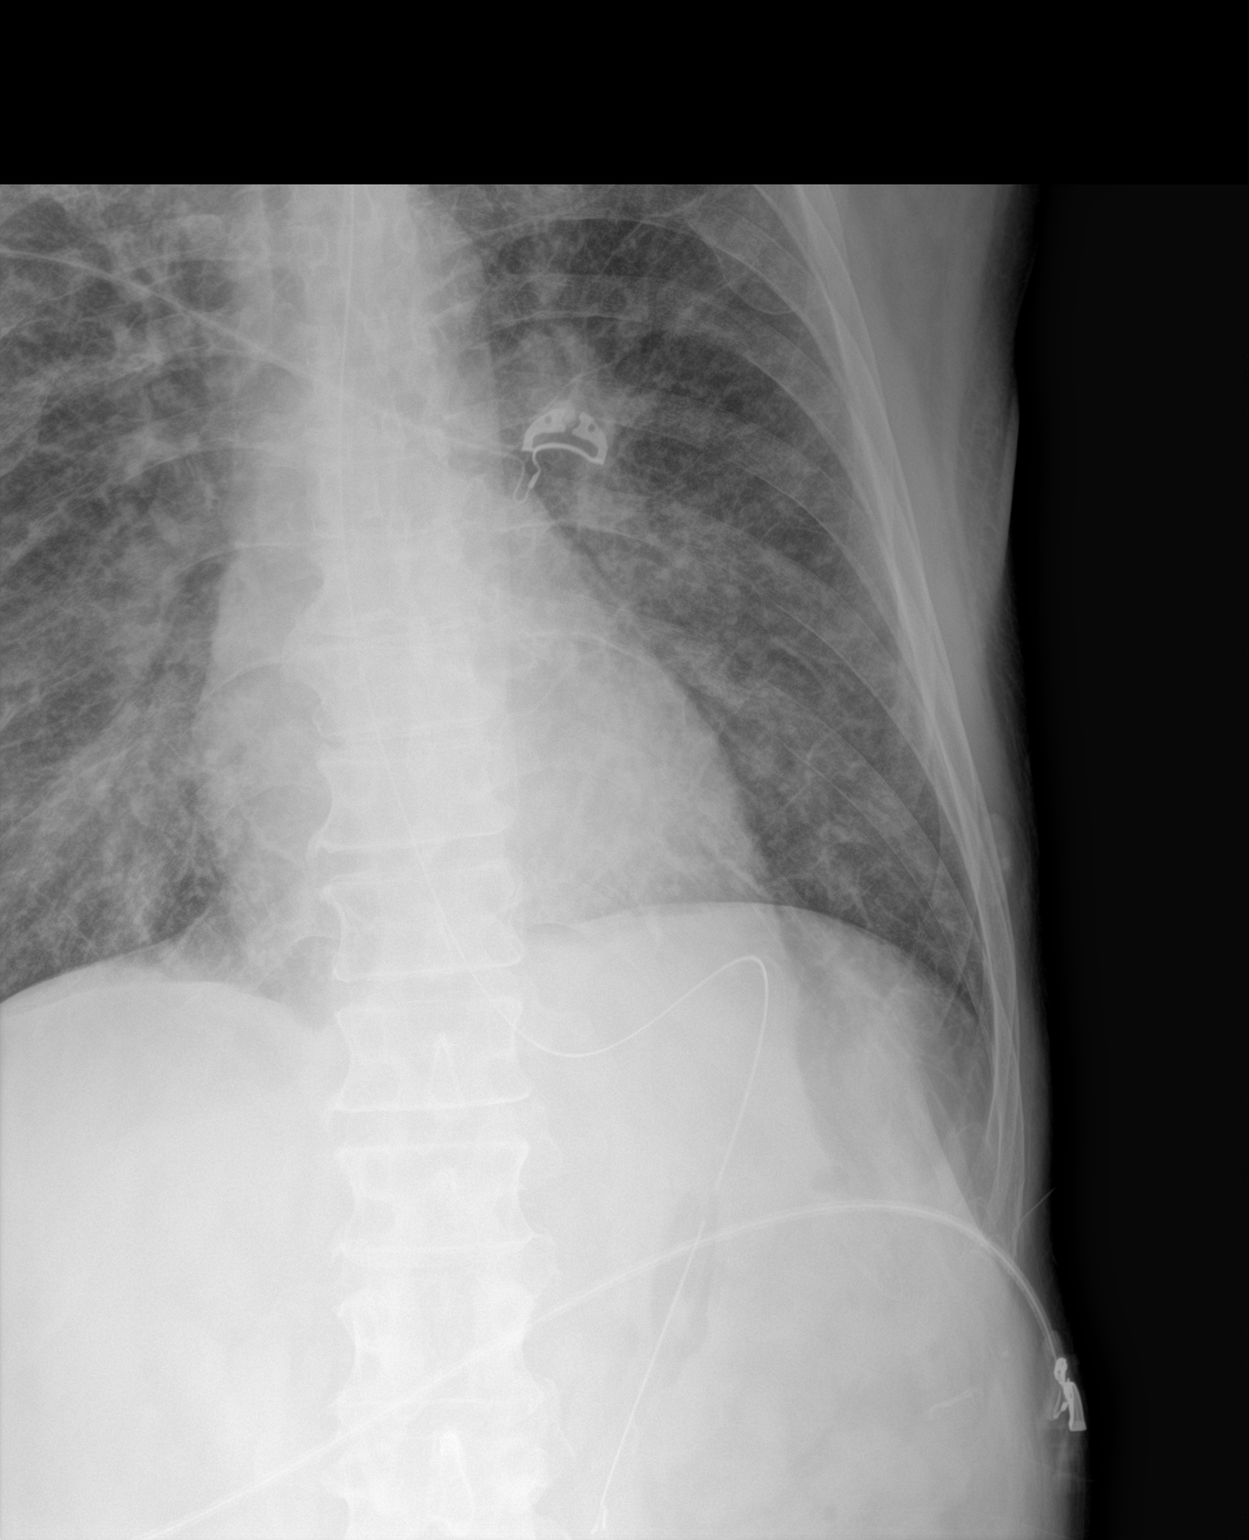

[1 of 1 positions shown; findings below may reference images not displayed]

FINDINGS: NG tube is seen to extend into the stomach with side port below the
GE junction. Pulmonary edema pattern.
IMPRESSION: NG tube in good position.

## 2020-12-26 IMAGING — CR LEFT FOOT - 2 VIEW
2 series · 2 of 2 positions shown · non-contrast
Comparison: None.

CLINICAL DATA: Pain

EXAM:
LEFT FOOT - 2 VIEW

[foot ap]
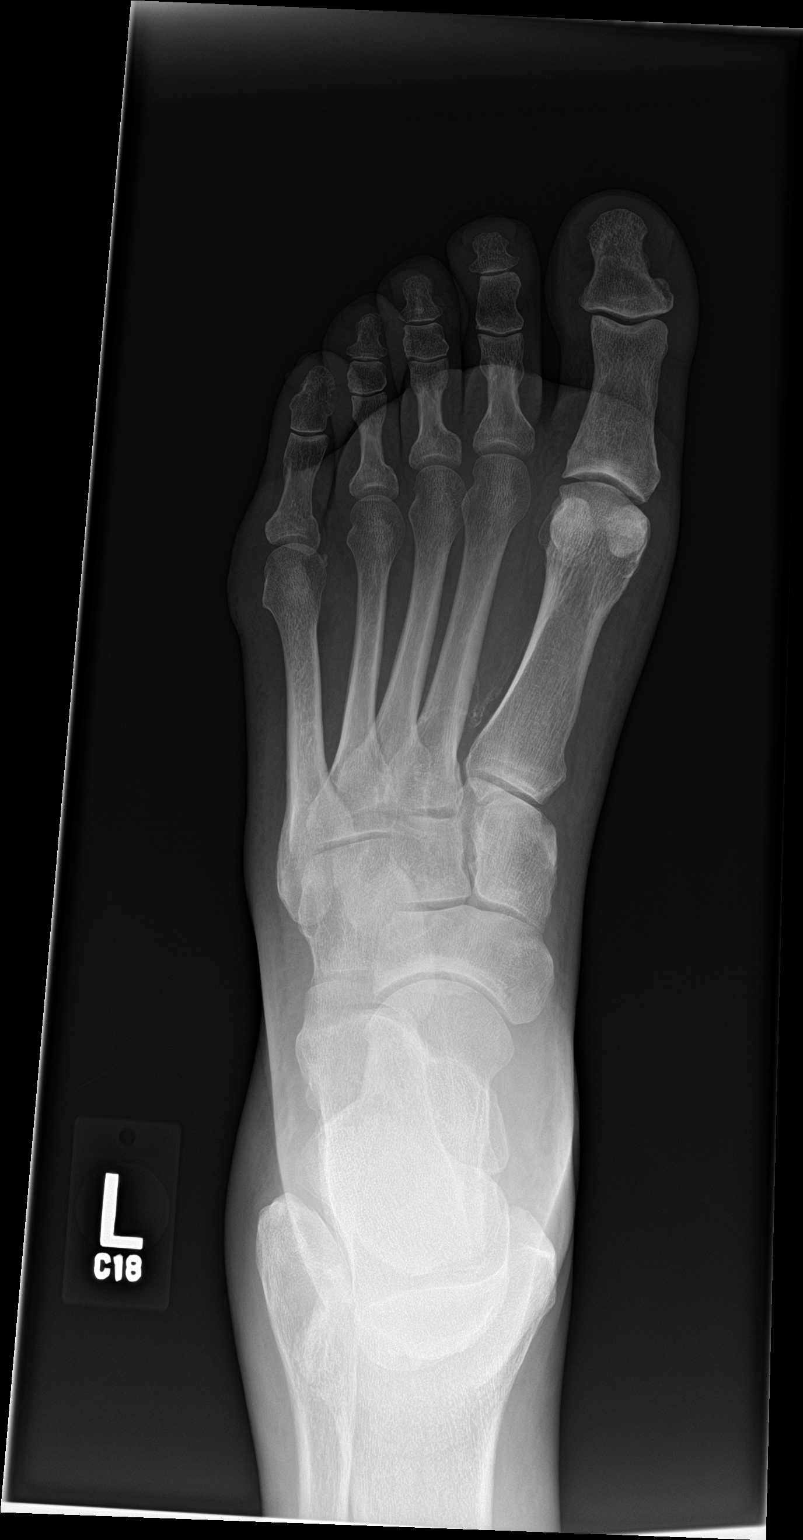

[foot lat]
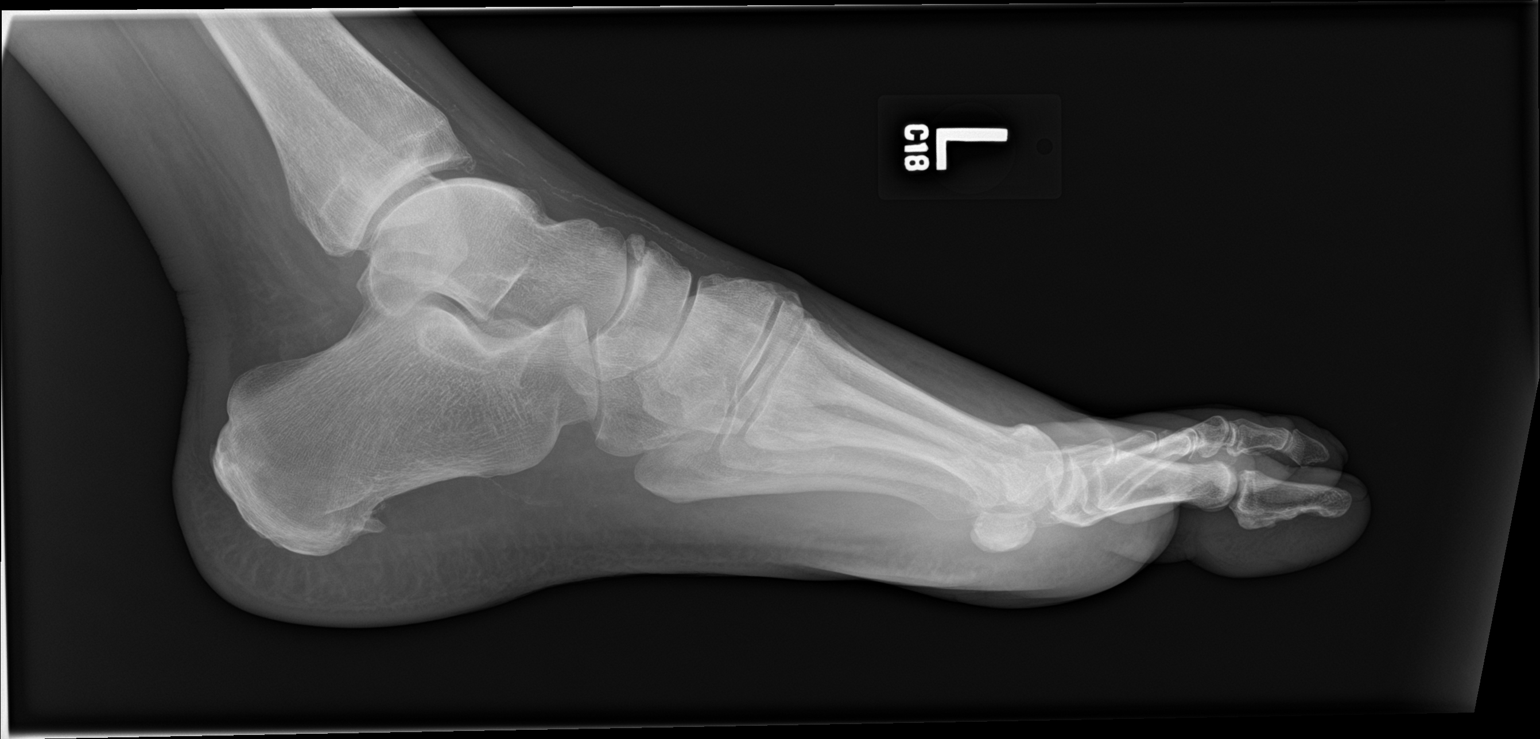

[2 of 2 positions shown; findings below may reference images not displayed]

FINDINGS: There is soft tissue swelling about the head of the fifth metacarpal
without evidence of underlying fracture. Vascular calcifications are
noted. There is some soft tissue swelling about the forefoot. A
plantar calcaneal spur is noted. There is a chronic appearing
deformity at the talonavicular joint likely representing sequela of
an old remote injury.
IMPRESSION: 1. No acute osseous abnormality.
2. Nonspecific soft tissue swelling about the head of the fifth
metacarpal. Correlation with physical exam is recommended.
3. Probable old fracture deformity at the talonavicular joint.
Correlation with point tenderness is recommended.
